# Patient Record
Sex: Female | Born: 1970
Health system: Southern US, Community
[De-identification: ages and names within clinical notes are randomized; demographics above are authoritative.]

## PROBLEM LIST (undated history)

## (undated) DIAGNOSIS — R112 Nausea with vomiting, unspecified: Secondary | ICD-10-CM

## (undated) DIAGNOSIS — R200 Anesthesia of skin: Secondary | ICD-10-CM

## (undated) DIAGNOSIS — Z9889 Other specified postprocedural states: Secondary | ICD-10-CM

## (undated) DIAGNOSIS — S82899A Other fracture of unspecified lower leg, initial encounter for closed fracture: Secondary | ICD-10-CM

## (undated) DIAGNOSIS — E669 Obesity, unspecified: Secondary | ICD-10-CM

## (undated) DIAGNOSIS — G4733 Obstructive sleep apnea (adult) (pediatric): Secondary | ICD-10-CM

## (undated) DIAGNOSIS — D219 Benign neoplasm of connective and other soft tissue, unspecified: Secondary | ICD-10-CM

## (undated) DIAGNOSIS — I10 Essential (primary) hypertension: Secondary | ICD-10-CM

## (undated) DIAGNOSIS — I2510 Atherosclerotic heart disease of native coronary artery without angina pectoris: Secondary | ICD-10-CM

## (undated) DIAGNOSIS — I219 Acute myocardial infarction, unspecified: Secondary | ICD-10-CM

## (undated) HISTORY — PX: SALPINGECTOMY: SHX328

## (undated) HISTORY — PX: APPENDECTOMY: SHX54

## (undated) HISTORY — DX: Obstructive sleep apnea (adult) (pediatric): G47.33

## (undated) HISTORY — DX: Essential (primary) hypertension: I10

## (undated) HISTORY — DX: Obesity, unspecified: E66.9

## (undated) HISTORY — PX: CHOLECYSTECTOMY: SHX55

## (undated) HISTORY — DX: Atherosclerotic heart disease of native coronary artery without angina pectoris: I25.10

## (undated) HISTORY — PX: REDUCTION MAMMAPLASTY: SUR839

---

## 2001-06-03 ENCOUNTER — Encounter: Payer: Self-pay | Admitting: Emergency Medicine

## 2001-06-03 ENCOUNTER — Emergency Department (HOSPITAL_COMMUNITY): Admission: EM | Admit: 2001-06-03 | Discharge: 2001-06-03 | Payer: Self-pay | Admitting: Emergency Medicine

## 2001-08-05 ENCOUNTER — Encounter: Payer: Self-pay | Admitting: Internal Medicine

## 2001-08-05 ENCOUNTER — Emergency Department (HOSPITAL_COMMUNITY): Admission: EM | Admit: 2001-08-05 | Discharge: 2001-08-05 | Payer: Self-pay | Admitting: Internal Medicine

## 2001-09-10 ENCOUNTER — Emergency Department (HOSPITAL_COMMUNITY): Admission: EM | Admit: 2001-09-10 | Discharge: 2001-09-10 | Payer: Self-pay | Admitting: *Deleted

## 2001-09-10 ENCOUNTER — Encounter: Payer: Self-pay | Admitting: *Deleted

## 2001-10-01 ENCOUNTER — Emergency Department (HOSPITAL_COMMUNITY): Admission: EM | Admit: 2001-10-01 | Discharge: 2001-10-01 | Payer: Self-pay | Admitting: Emergency Medicine

## 2001-10-13 HISTORY — PX: OTHER SURGICAL HISTORY: SHX169

## 2002-03-30 ENCOUNTER — Emergency Department (HOSPITAL_COMMUNITY): Admission: EM | Admit: 2002-03-30 | Discharge: 2002-03-30 | Payer: Self-pay | Admitting: Emergency Medicine

## 2002-03-30 ENCOUNTER — Encounter: Payer: Self-pay | Admitting: Emergency Medicine

## 2002-05-02 ENCOUNTER — Emergency Department (HOSPITAL_COMMUNITY): Admission: EM | Admit: 2002-05-02 | Discharge: 2002-05-03 | Payer: Self-pay | Admitting: *Deleted

## 2002-05-09 ENCOUNTER — Encounter: Payer: Self-pay | Admitting: Family Medicine

## 2002-05-09 ENCOUNTER — Inpatient Hospital Stay (HOSPITAL_COMMUNITY): Admission: AD | Admit: 2002-05-09 | Discharge: 2002-05-12 | Payer: Self-pay | Admitting: Family Medicine

## 2002-05-09 IMAGING — CT CT PELVIS W/ CM
1 of 2 series · 15 of 32 positions shown, 19 images · IV contrast (CONTRAST)
Comparison: none

FINDINGS
CLINICAL DATA: ABDOMINAL PAIN WITH VOMITING.  QUESTION APPENDICITIS.
CT ABDOMEN WITH CONTRAST MEDIA
CT SCAN OF THE ABDOMEN AND PELVIS WAS PERFORMED WITH INTRAVENOUS BUT WITHOUT ORAL CONTRAST (AS PER
REQUEST OF DR. MAHAKAL AFTER THE PATIENT WAS NOTED TO HAVE DIFFICULTY INGESTING CONTRAST).  150 CC
OMNIPAQUE 300 WAS UTILIZED.  NO COMPARISON EXAMS.
FATTY INFILTRATION OF THE LIVER.  NO EVIDENCE OF ABNORMAL FLUID COLLECTION OR OBVIOUS ABNORMAL
INFLAMMATORY PROCESS.  LACK OF ORAL CONTRAST LIMITS EVALUATION.  SPLEEN, PANCREAS, ADRENAL GLANDS
AND KIDNEYS UNREMARKABLE.
IMPRESSION
LIMITED BY LACK OF ORAL CONTRAST WITHOUT EVIDENCE OF OBVIOUS INFLAMMATORY PROCESS OR FLUID
COLLECTION.
FATTY INFILTRATION OF LIVER.
CT PELVIS WITH CONTRAST MEDIA
NO OBVIOUS INFLAMMATORY PROCESS ALONG THE COURSE OF THE APPENDIX.  LACK OF ORAL CONTRAST LIMITS
EVALUATION OF SUCH AS WELL AS EVALUATION OF SURROUNDING BOWEL.  PROMINENT APPEARANCE OF OVARIES.
SMALL AMOUNT OF FLUID IN THE RIGHT ASPECT OF THE CUL-DE-SAC CANNOT BE EXCLUDED (EVALUATION LIMITED
BY STREAK ARTIFACT).  PELVIC SONOGRAM CAN BE OBTAINED FOR FURTHER DELINEATION.  ADDITIONALLY, IF
THE PATIENT HAS PERSISTENT SYMPTOMS SUSPICIOUS FOR BOWEL ABNORMALITY, REPEAT EXAM AFTER PLACEMENT
OF NASOGASTRIC TUBE AND ADMINISTRATION OF ORAL CONTRAST OR INSTILLATION OF RECTAL CONTACT MAY BE
CONSIDERED.
LIMITED EXAM WITHOUT EVIDENCE OF APPENDICITIS AS NOTED ABOVE.
PROMINENT OVARIES.  ULTRASOUND MAY BE CONSIDERED FOR FURTHER DELINEATION.  THIS WOULD ALSO HELP
DETERMINE IF THERE IS A SMALL AMOUNT OF FLUID IN THE CUL-DE-SAC.

[Series 72: — · axial · 0.67mm/px · z∈[+972,+1392]mm · 15 of 92 slices shown, 19 images]
[im 4/92  soft-tissue]
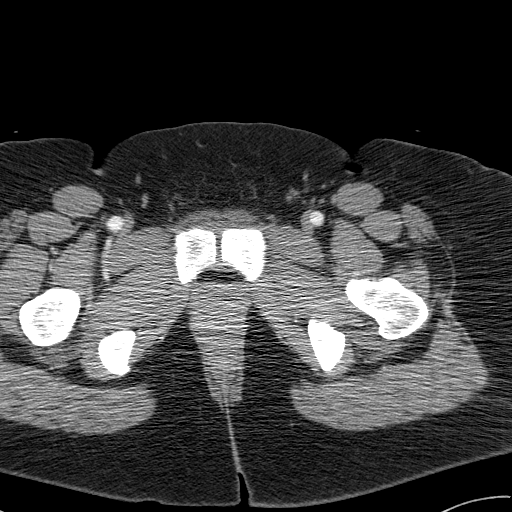
[im 4/92  bone]
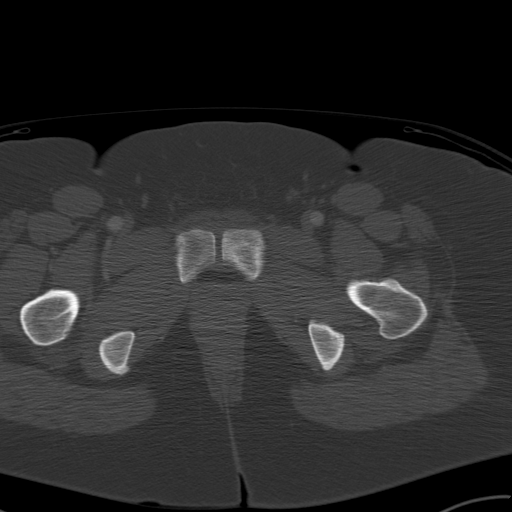
[im 12/92  soft-tissue]
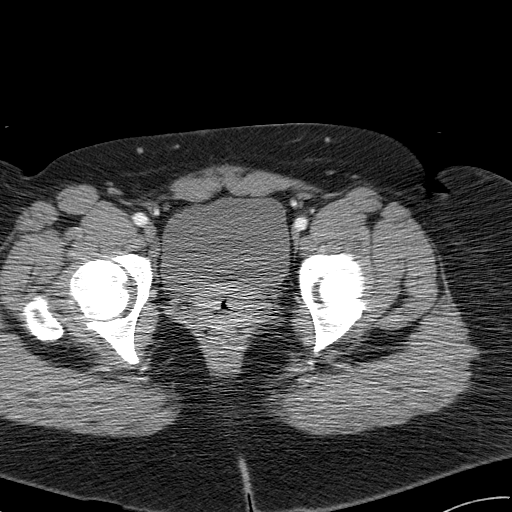
[im 20/92  soft-tissue]
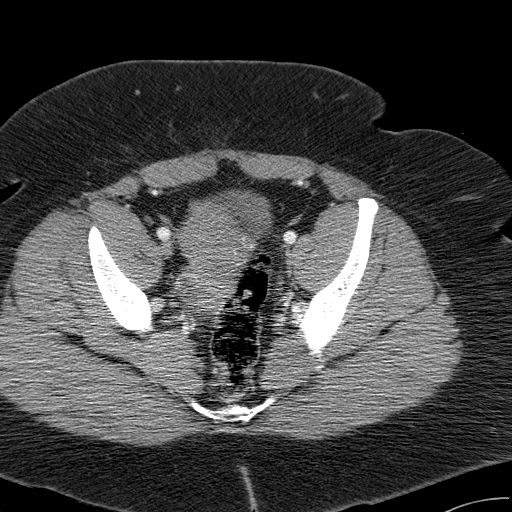
[im 24/92  soft-tissue]
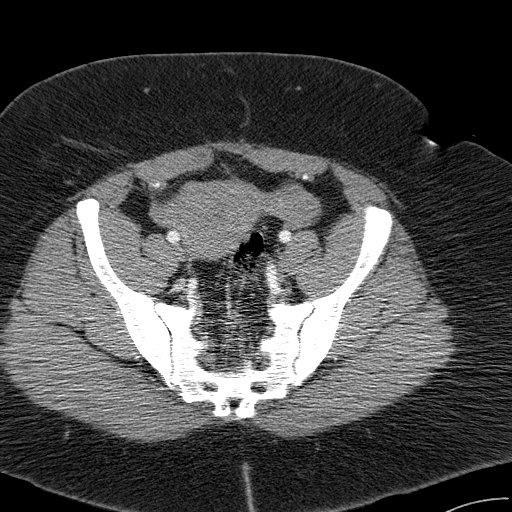
[im 32/92  soft-tissue]
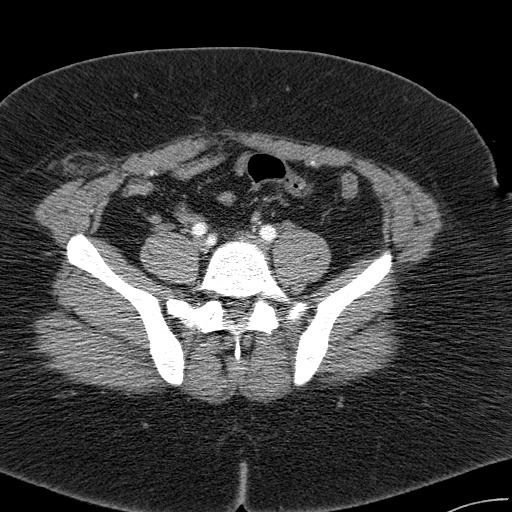
[im 40/92  soft-tissue]
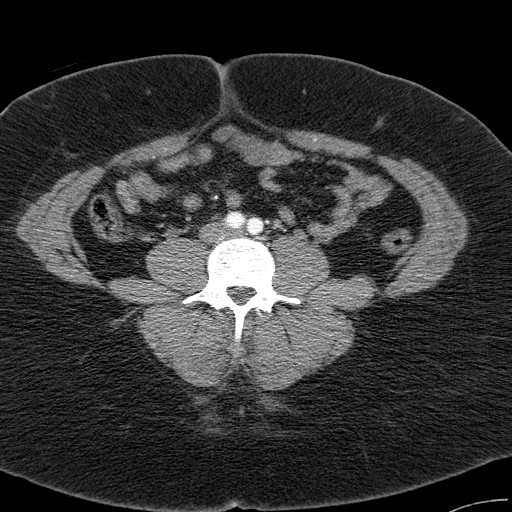
[im 48/92  soft-tissue]
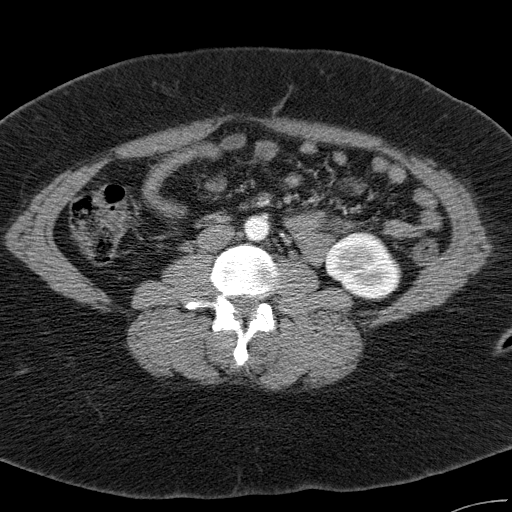
[im 52/92  soft-tissue]
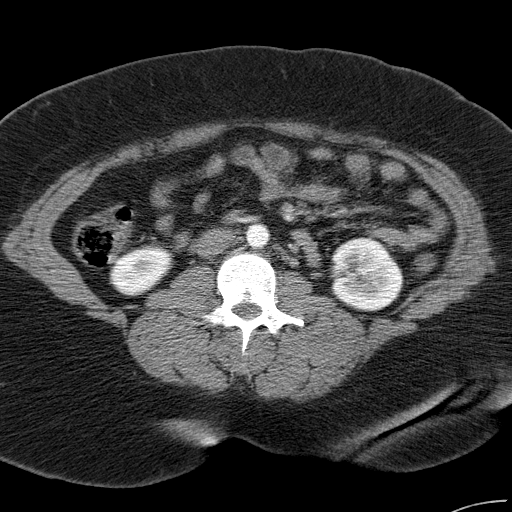
[im 60/92  soft-tissue]
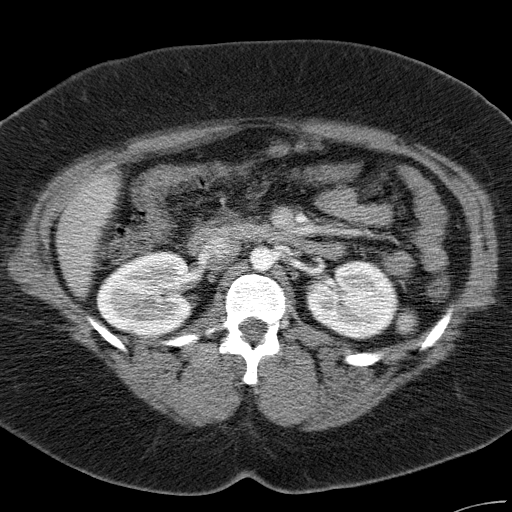
[im 60/92  bone]
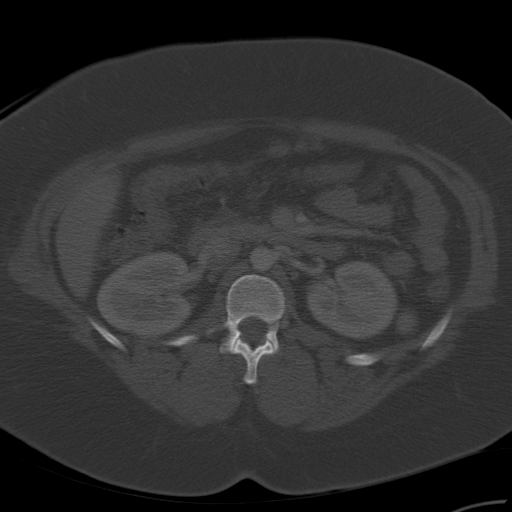
[im 68/92  soft-tissue]
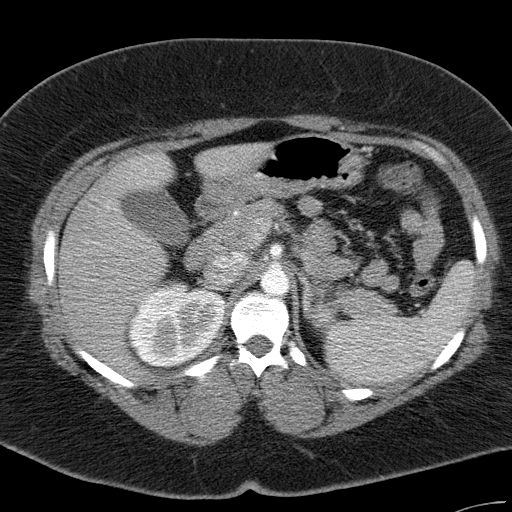
[im 72/92  soft-tissue]
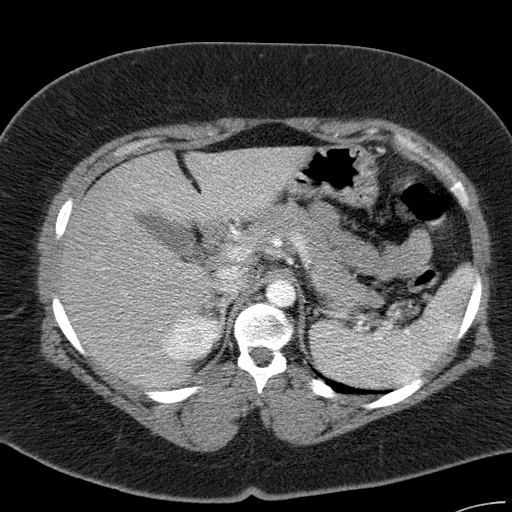
[im 76/92  lung]
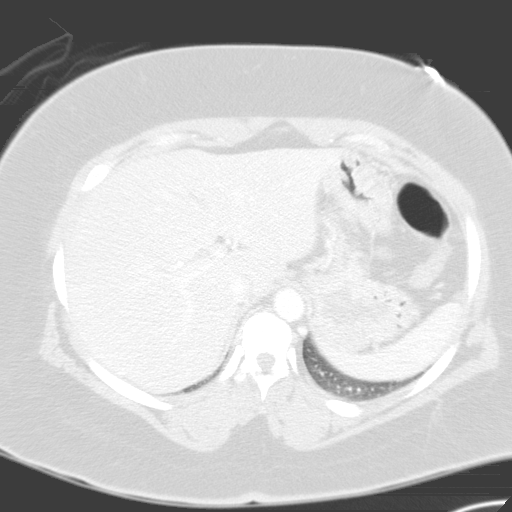
[im 80/92  soft-tissue]
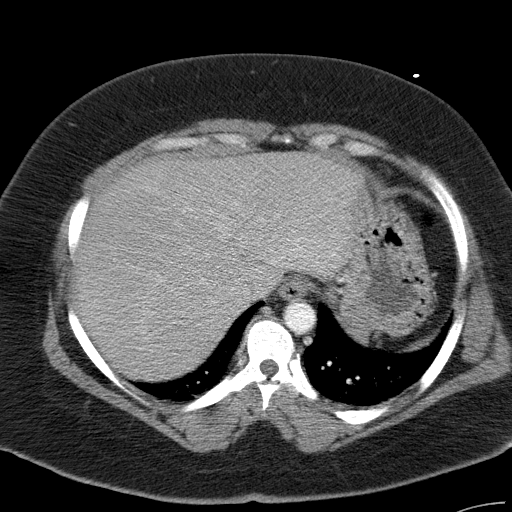
[im 80/92  lung]
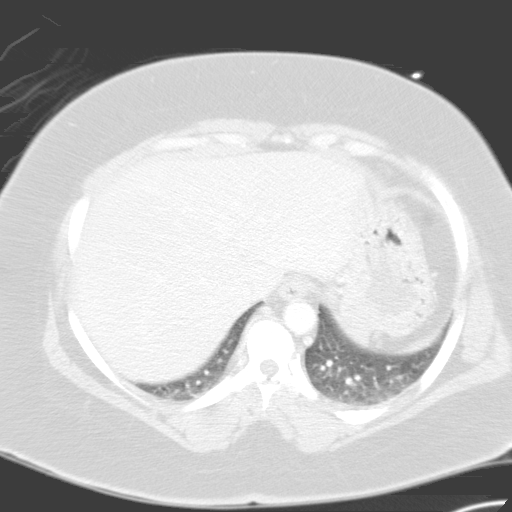
[im 84/92  lung]
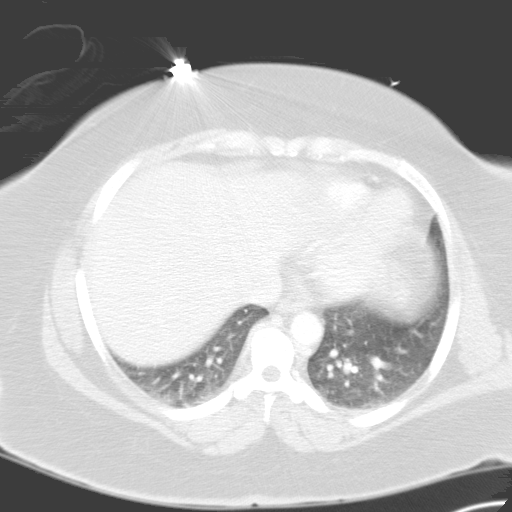
[im 88/92  soft-tissue]
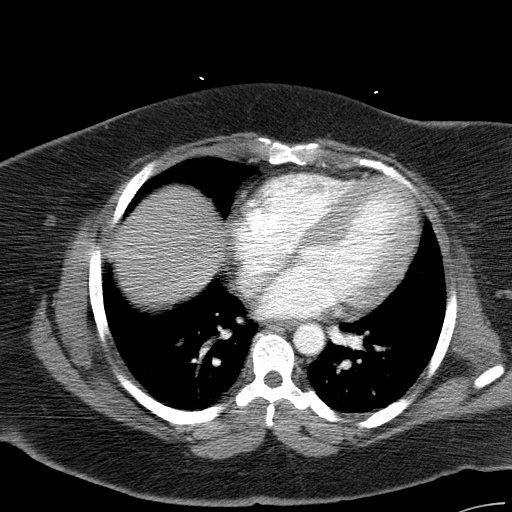
[im 88/92  lung]
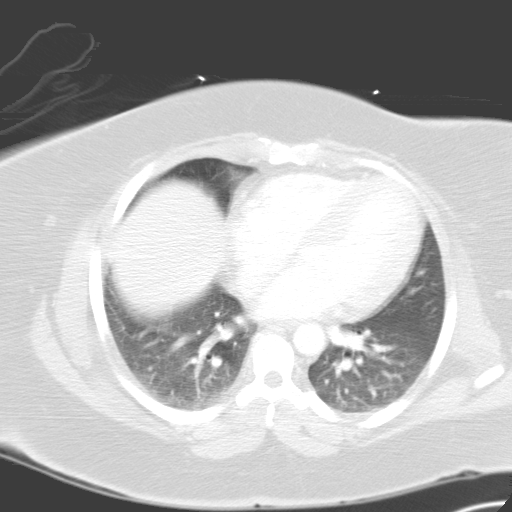

[15 of 32 positions shown; findings below may reference images not displayed]

## 2002-05-10 ENCOUNTER — Encounter: Payer: Self-pay | Admitting: Family Medicine

## 2002-05-11 ENCOUNTER — Encounter: Payer: Self-pay | Admitting: Family Medicine
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
## 2002-05-12 ENCOUNTER — Encounter: Payer: Self-pay | Admitting: Family Medicine

## 2002-05-17 ENCOUNTER — Inpatient Hospital Stay (HOSPITAL_COMMUNITY): Admission: RE | Admit: 2002-05-17 | Discharge: 2002-05-19 | Payer: Self-pay | Admitting: General Surgery

## 2002-08-21 ENCOUNTER — Emergency Department (HOSPITAL_COMMUNITY): Admission: EM | Admit: 2002-08-21 | Discharge: 2002-08-21 | Payer: Self-pay | Admitting: Emergency Medicine

## 2002-08-21 ENCOUNTER — Encounter: Payer: Self-pay | Admitting: Emergency Medicine

## 2002-10-13 HISTORY — PX: OTHER SURGICAL HISTORY: SHX169

## 2003-12-09 ENCOUNTER — Emergency Department (HOSPITAL_COMMUNITY): Admission: EM | Admit: 2003-12-09 | Discharge: 2003-12-09 | Payer: Self-pay | Admitting: Emergency Medicine
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
## 2004-07-19 ENCOUNTER — Emergency Department (HOSPITAL_COMMUNITY): Admission: EM | Admit: 2004-07-19 | Discharge: 2004-07-19 | Payer: Self-pay | Admitting: Emergency Medicine

## 2004-07-19 IMAGING — CT CT PELVIS W/ CM
1 of 3 series · 14 of 32 positions shown, 19 images · IV contrast (CONTRAST)
Comparison: Report dated [DATE].

ABDOMEN CT WITH CONTRAST

<!--  IDXRADR:ADDEND:BEGIN -->Addendum Begins
<!--  IDXRADR:ADDEND:INNER_BEGIN -->Additional clinical information indicates that the patient has had no bacteria or white cells in
her urine and no other findings to indicate a right kidney infection other than right abdominal
pain. On review of the images, the previously suspected 2.9 x 2.2 cm area of lobar nephronia in the
right kidney may represent imaging of a portion of incompletely opacified right renal medulla due
to relatively early phase imaging on the delayed images through the kidneys. The medulla elsewhere
in both kidneys has a similar appearance. 

<!--  IDXRADR:ADDEND:INNER_END -->Addendum Ends
<!--  IDXRADR:ADDEND:END -->Clinical Data: Right abdominal pain, nausea, vomiting and constipation. Previous cholecystectomy
and appendectomy.
TECHNIQUE: Multidetector CT imaging of the abdomen and pelvis was performed following the standard
protocol during bolus administration of intravenous contrast.
Contrast:  150 cc Omnipaque 300

[Series 7793: — · axial · 0.71mm/px · z∈[+1398,+1818]mm · 14 of 96 slices shown, 19 images]
[im 6/96  soft-tissue]
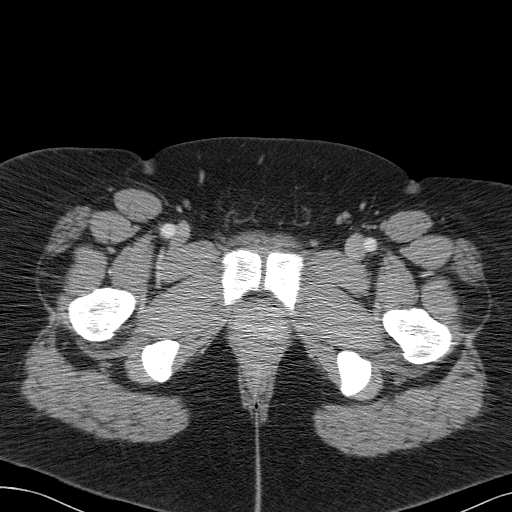
[im 6/96  bone]
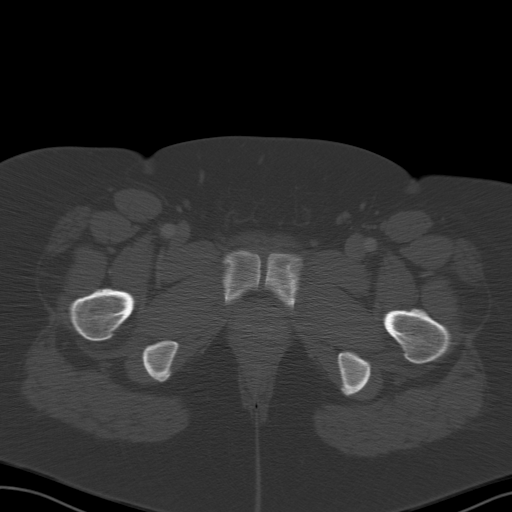
[im 12/96  soft-tissue]
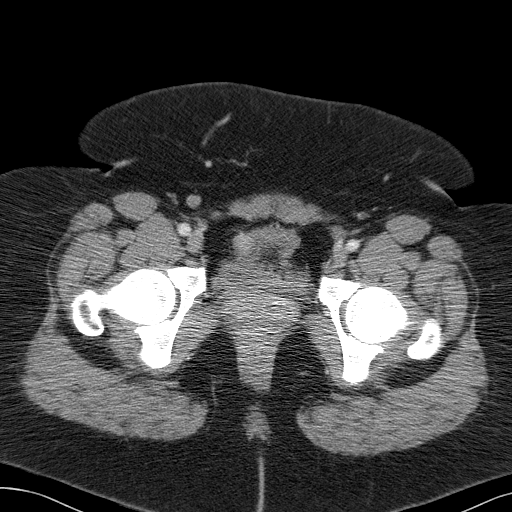
[im 23/96  soft-tissue]
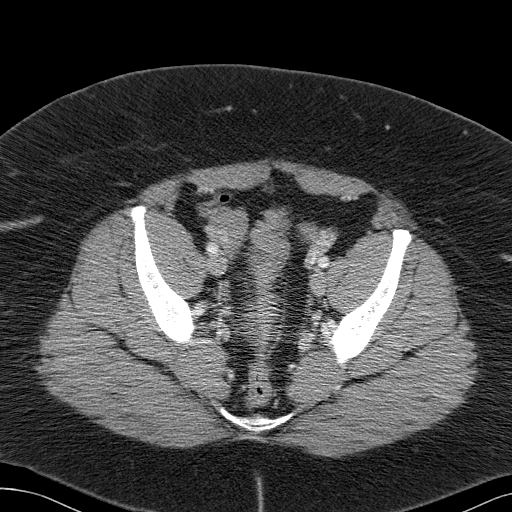
[im 28/96  soft-tissue]
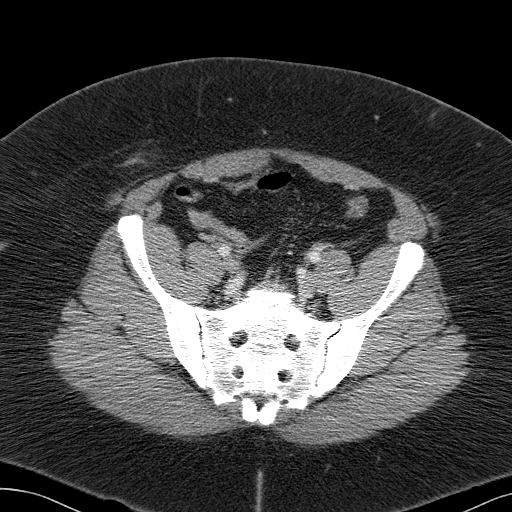
[im 34/96  soft-tissue]
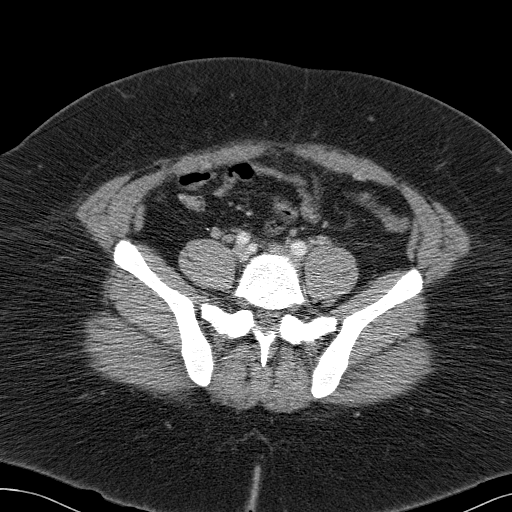
[im 40/96  soft-tissue]
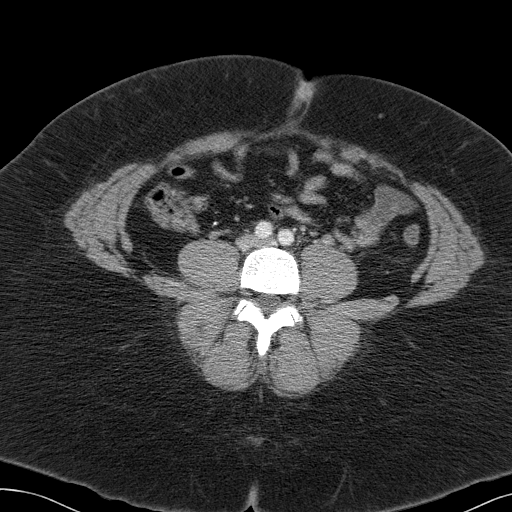
[im 51/96  soft-tissue]
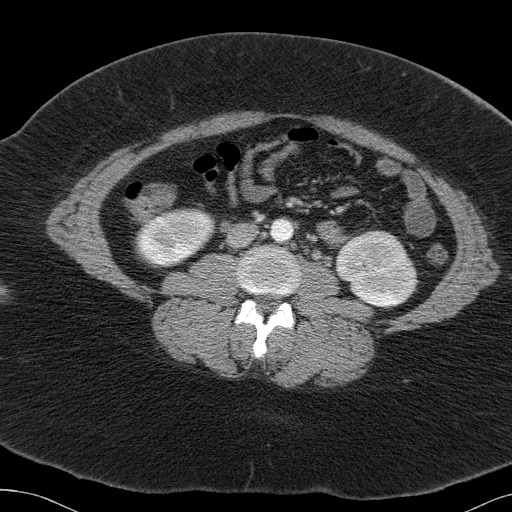
[im 56/96  soft-tissue]
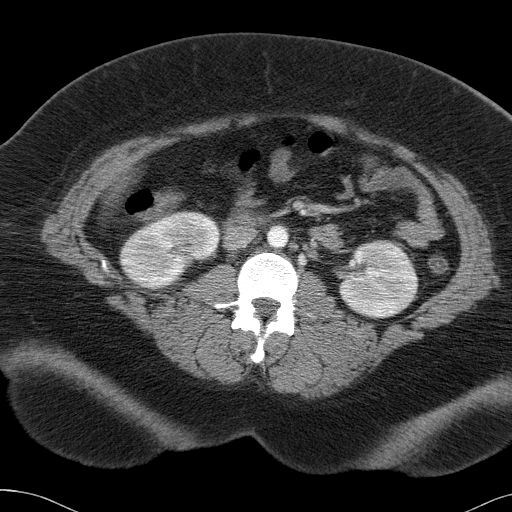
[im 62/96  soft-tissue]
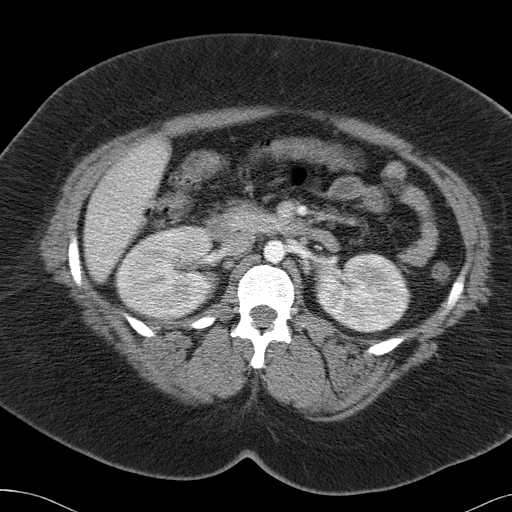
[im 62/96  bone]
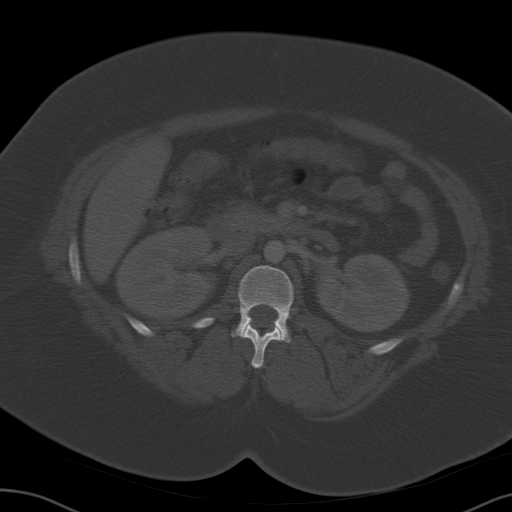
[im 68/96  soft-tissue]
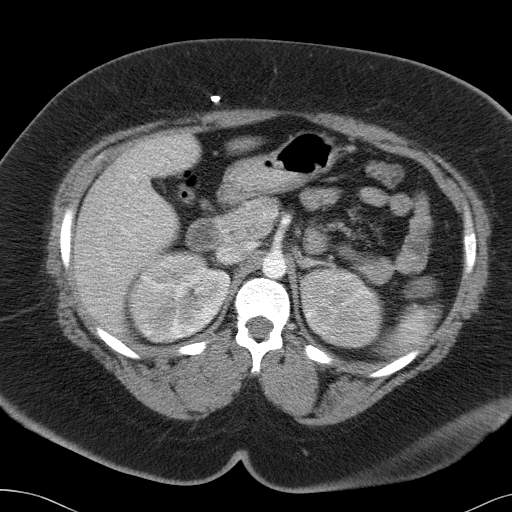
[im 73/96  soft-tissue]
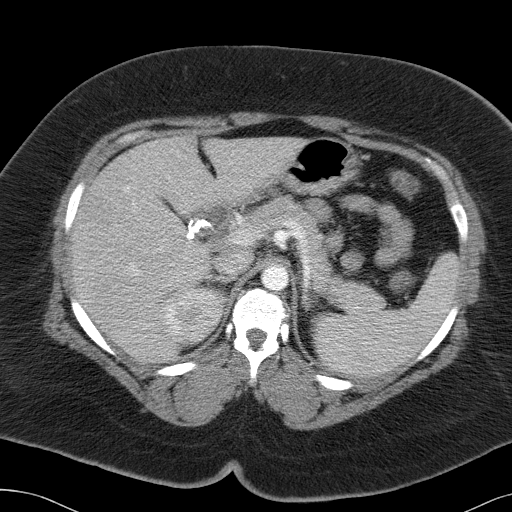
[im 73/96  lung]
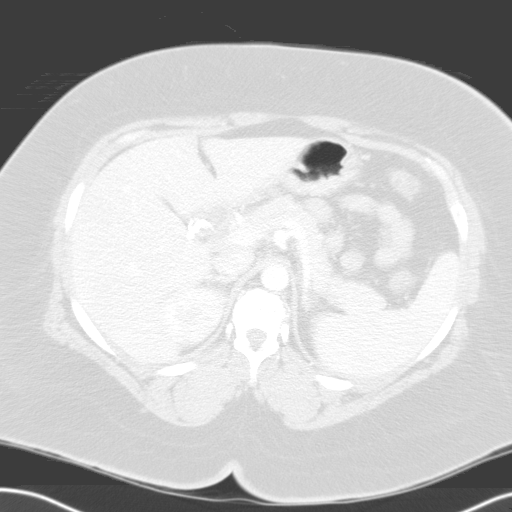
[im 79/96  lung]
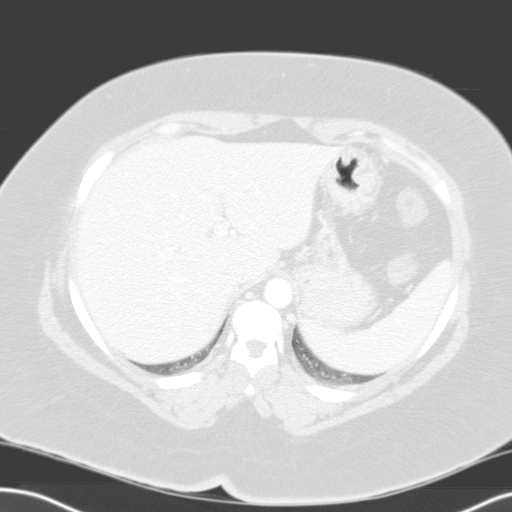
[im 84/96  soft-tissue]
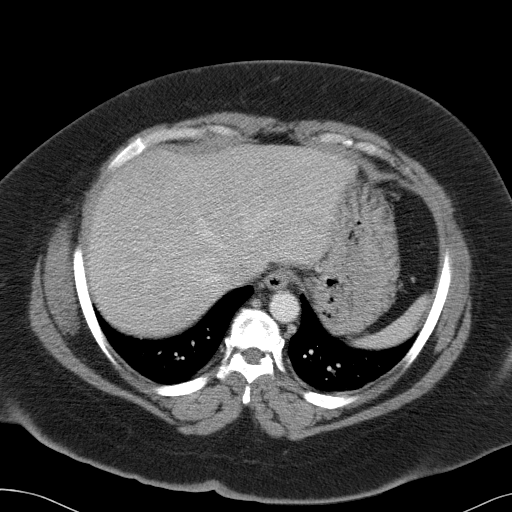
[im 84/96  lung]
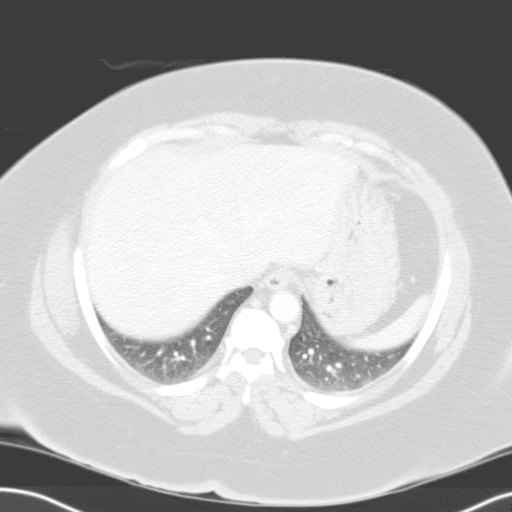
[im 90/96  soft-tissue]
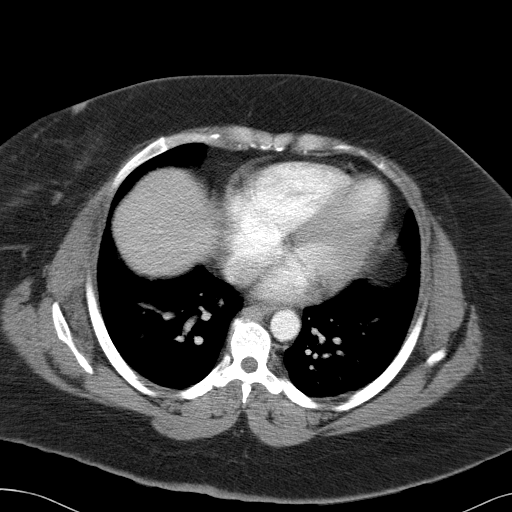
[im 90/96  lung]
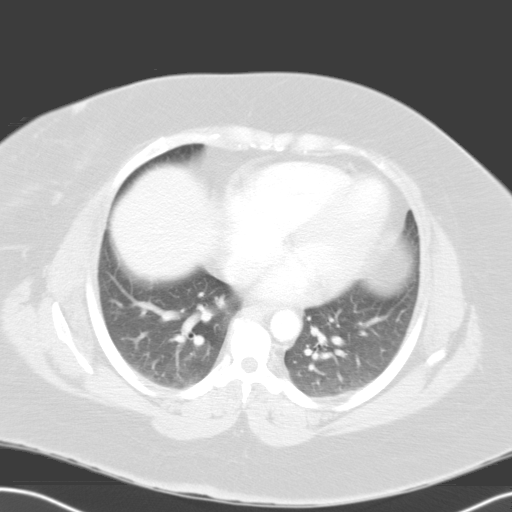

[14 of 32 positions shown; findings below may reference images not displayed]

FINDINGS: 2.9 x 2.2 cm ill-defined oval area of low density in the lateral aspect of the
midportion of the right kidney.  clips. Otherwise, unremarkable examination.

IMPRESSION

Right renal lobar nephronia.

PELVIS CT WITH CONTRAST
FINDINGS: Dense calcification or suture material in the expected position of the appendix. 1.7 cm
simple appearing left ovarian cyst. No free peritoneal fluid or air.

IMPRESSION

1.7 cm left ovarian cyst.

## 2004-07-19 IMAGING — CR DG ABDOMEN ACUTE W/ 1V CHEST
4 series · 4 of 4 positions shown · non-contrast
Comparison: none

CLINICAL DATA: Abdominal pain right side. Vomiting and diarrhea for 3 days.

[view not recorded (1 of 4)]
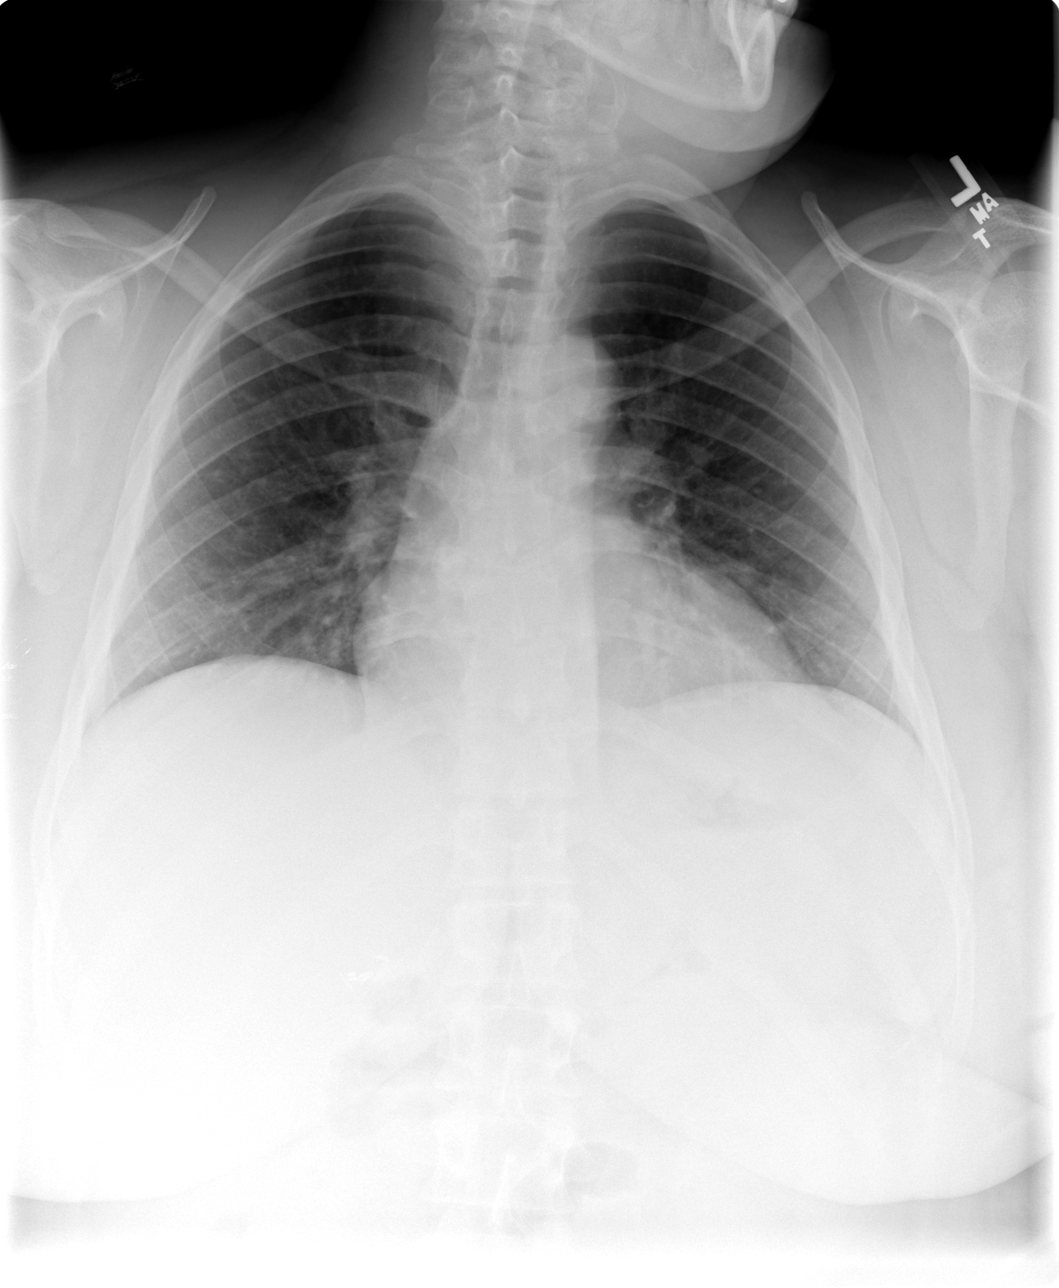

[view not recorded (2 of 4)]
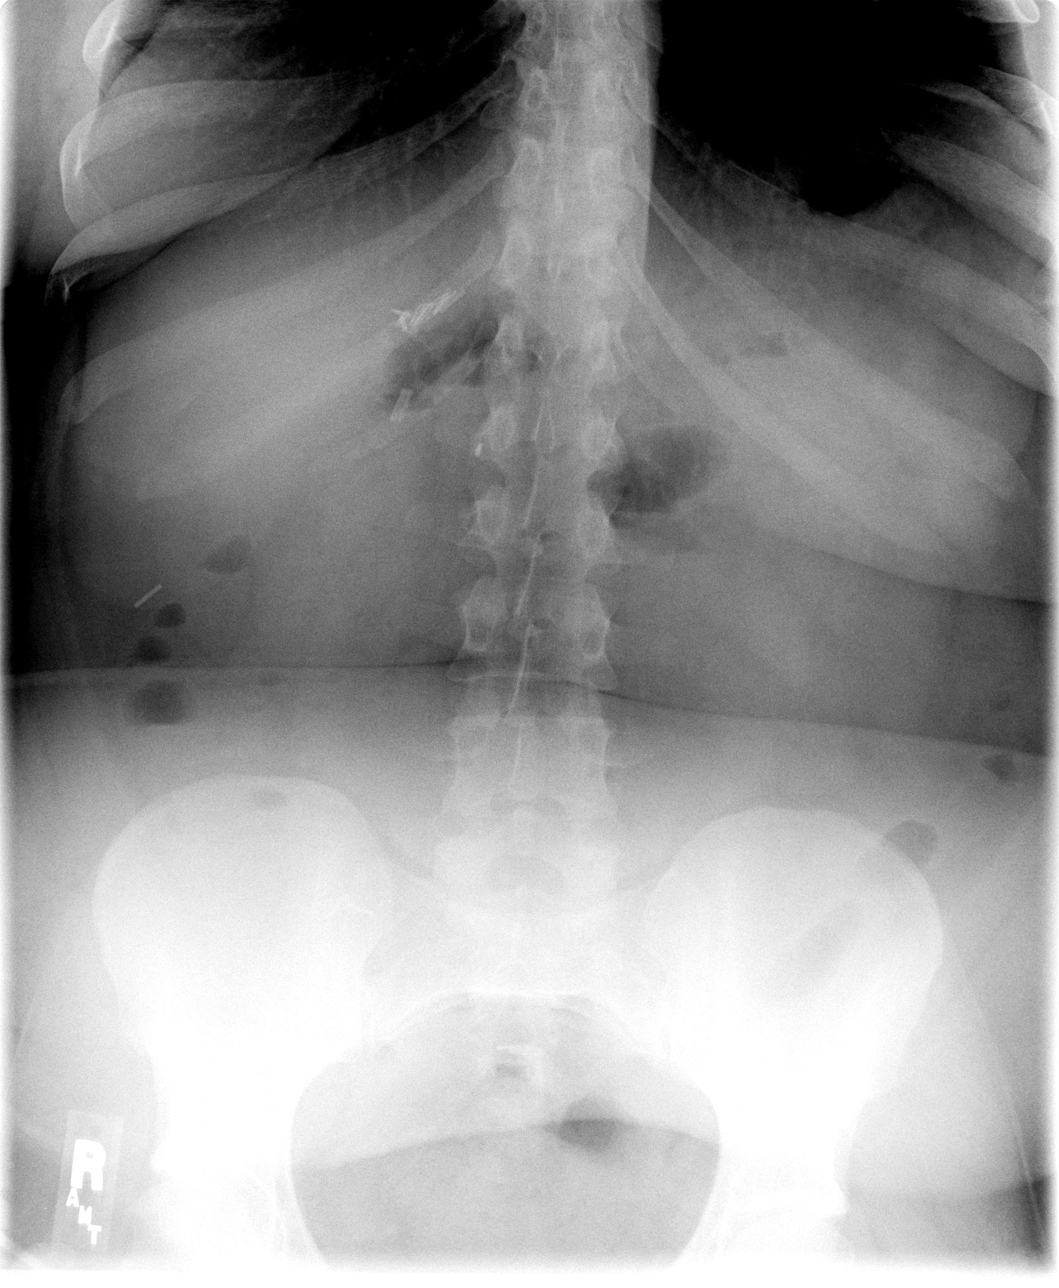

[view not recorded (3 of 4)]
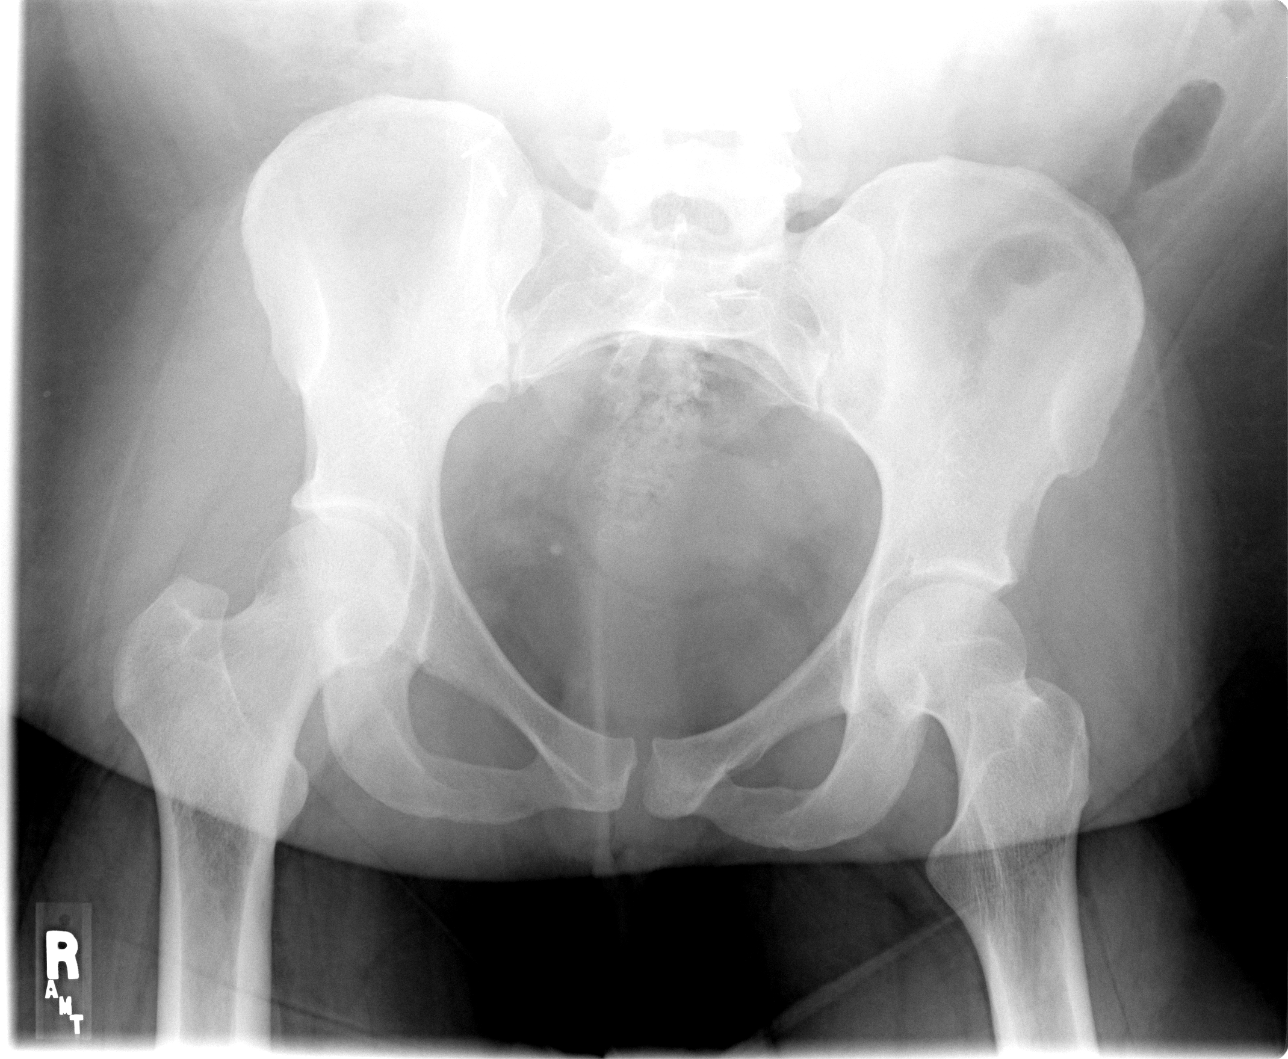

[view not recorded (4 of 4)]
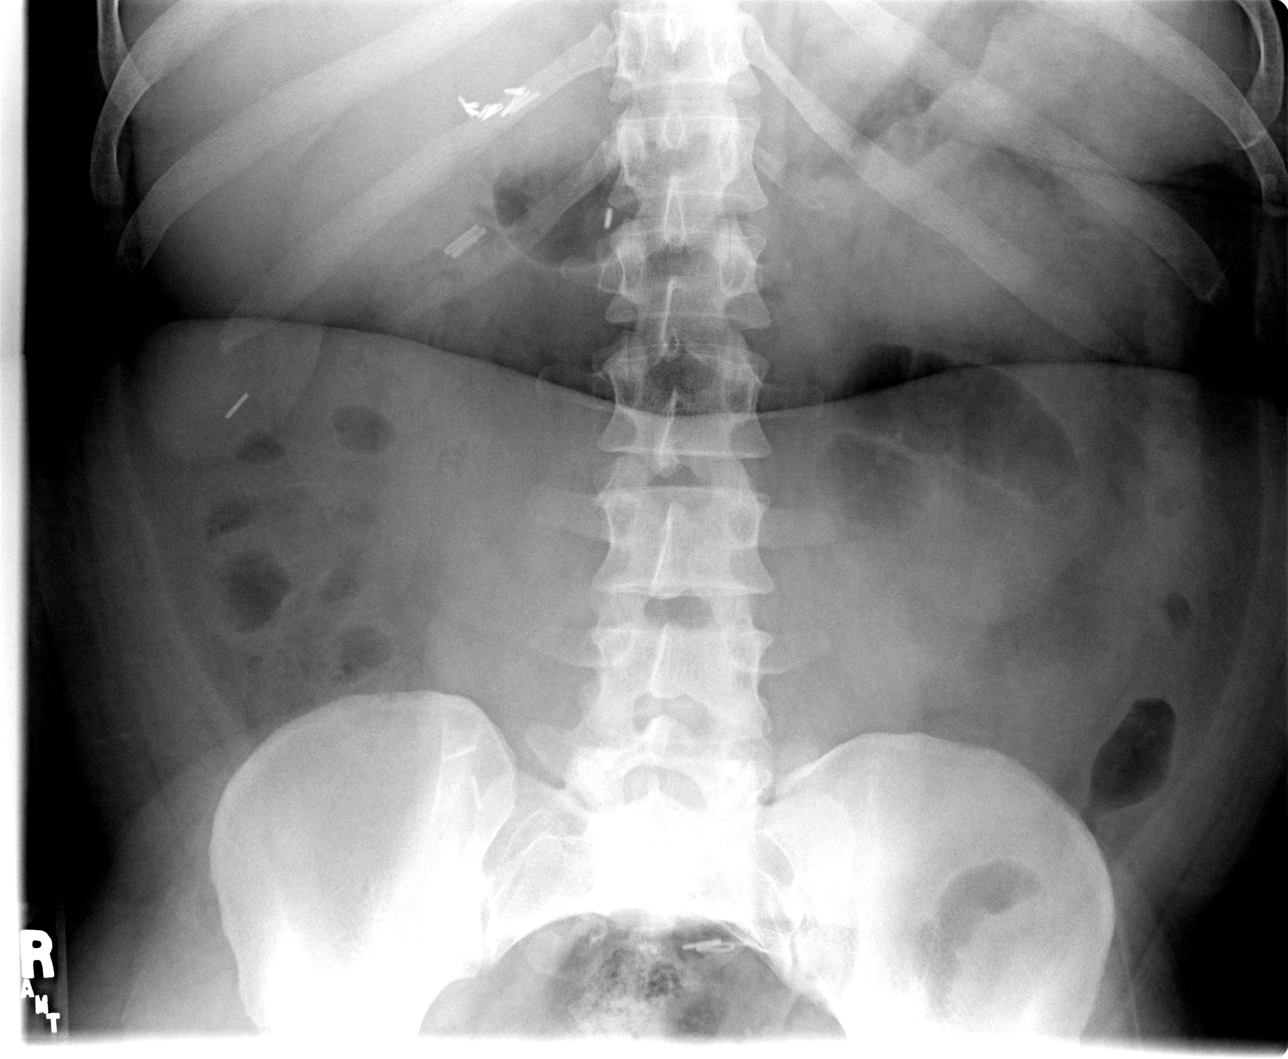

[4 of 4 positions shown; findings below may reference images not displayed]

ABDOMEN ACUTE WITH CHEST

 Flat and erect films of  the abdomen are made and show no evidence of obstruction. No free air is
seen beneath the diaphragm. There are metallic clips in right upper quadrant of the abdomen, right
lower quadrant of the abdomen and in the mid pelvis. No abnormal calcifications or masses are
identified. 

PA erect film of the chest shows no evidence of active disease.

IMPRESSIONS
No evidence of obstruction of the bowel and  no active disease in the chest.

## 2004-07-26 ENCOUNTER — Ambulatory Visit (HOSPITAL_COMMUNITY): Admission: RE | Admit: 2004-07-26 | Discharge: 2004-07-26 | Payer: Self-pay | Admitting: Internal Medicine

## 2005-01-13 ENCOUNTER — Ambulatory Visit: Payer: Self-pay | Admitting: Family Medicine

## 2005-02-10 ENCOUNTER — Ambulatory Visit: Payer: Self-pay | Admitting: Family Medicine

## 2005-02-11 ENCOUNTER — Ambulatory Visit: Payer: Self-pay | Admitting: Family Medicine

## 2005-04-10 ENCOUNTER — Ambulatory Visit: Payer: Self-pay | Admitting: Family Medicine

## 2005-04-10 ENCOUNTER — Ambulatory Visit (HOSPITAL_COMMUNITY): Admission: RE | Admit: 2005-04-10 | Discharge: 2005-04-10 | Payer: Self-pay | Admitting: Family Medicine

## 2005-04-10 IMAGING — CT CT ABDOMEN W/ CM
1 of 3 series · 14 of 32 positions shown, 20 images · IV contrast (CONTRAST)
Comparison: [DATE]

ABDOMEN CT WITH CONTRAST

CLINICAL DATA: Lower abdominal pain
TECHNIQUE: Multidetector CT imaging of the abdomen and pelvis was performed
following the standard protocol during bolus administration of intravenous
contrast.

Contrast:  150 cc Omnipaque 300

[Series 3269: — · axial · 0.97mm/px · z∈[+1528,+1938]mm · 14 of 94 slices shown, 20 images]
[im 6/94  soft-tissue]
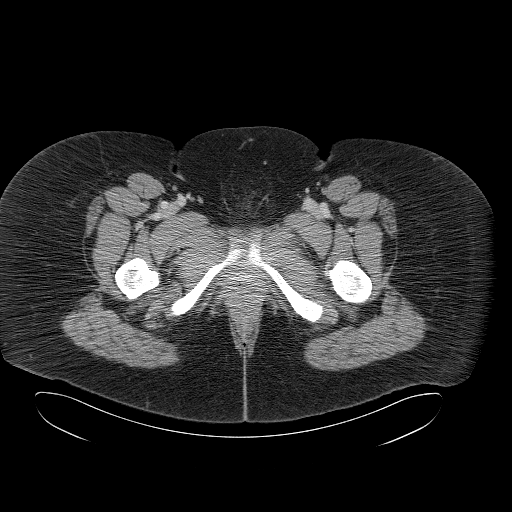
[im 6/94  bone]
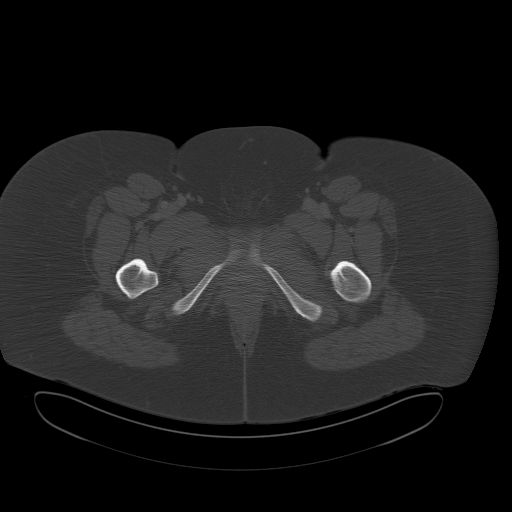
[im 11/94  soft-tissue]
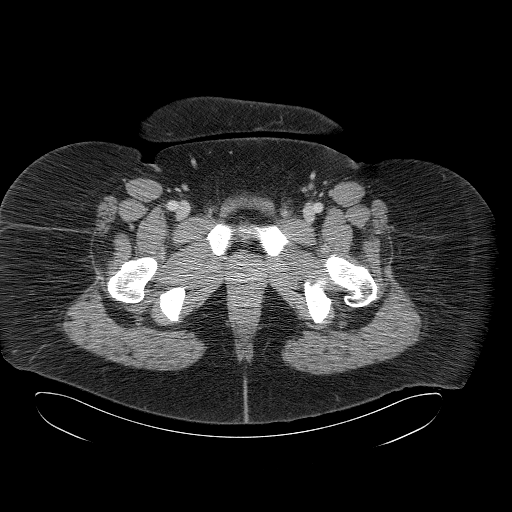
[im 17/94  soft-tissue]
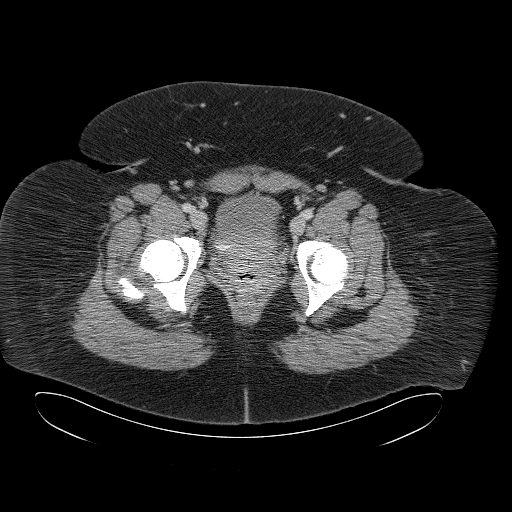
[im 28/94  soft-tissue]
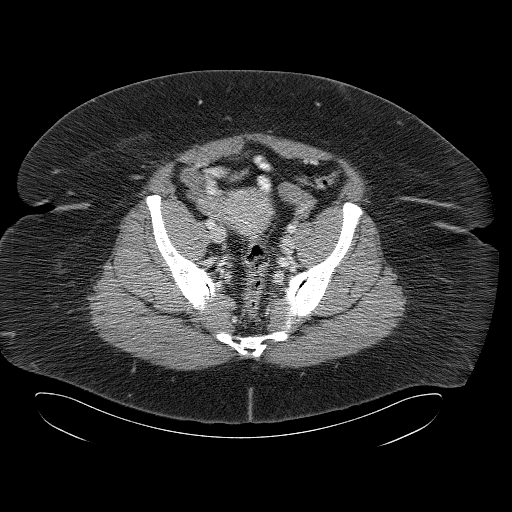
[im 33/94  soft-tissue]
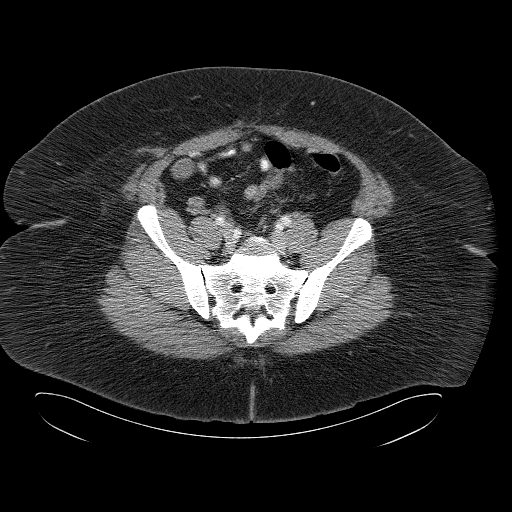
[im 39/94  soft-tissue]
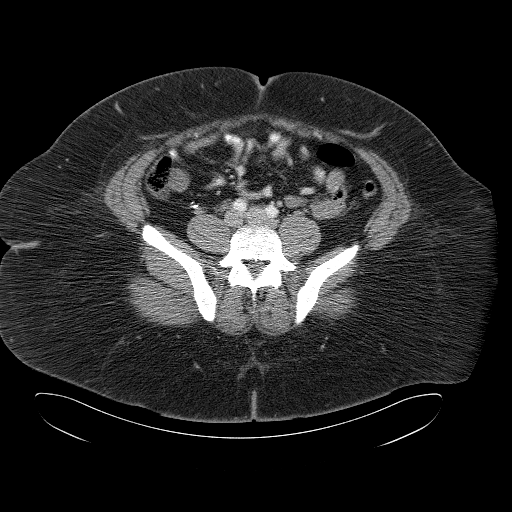
[im 44/94  soft-tissue]
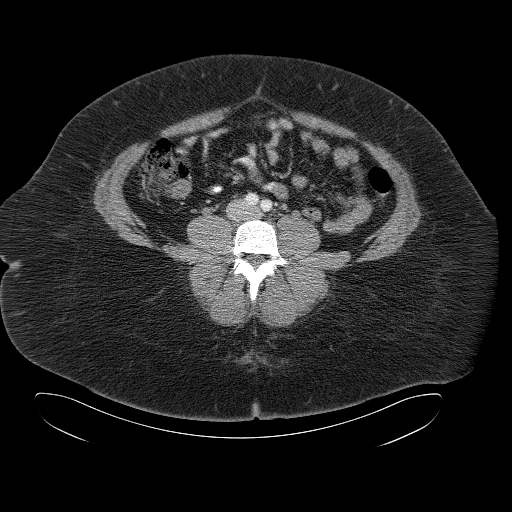
[im 50/94  soft-tissue]
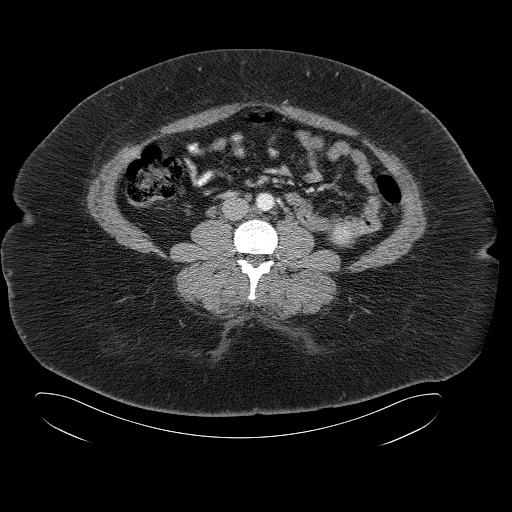
[im 55/94  soft-tissue]
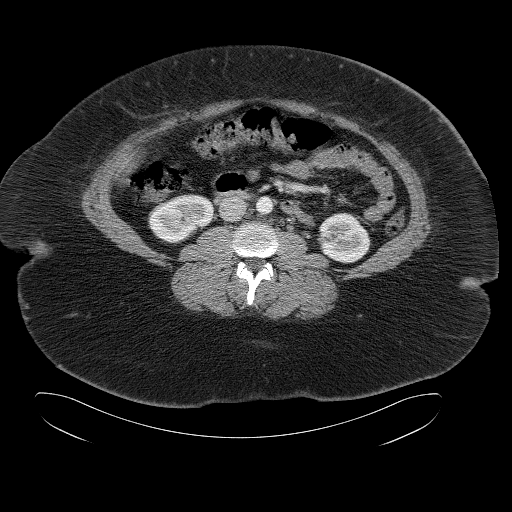
[im 55/94  bone]
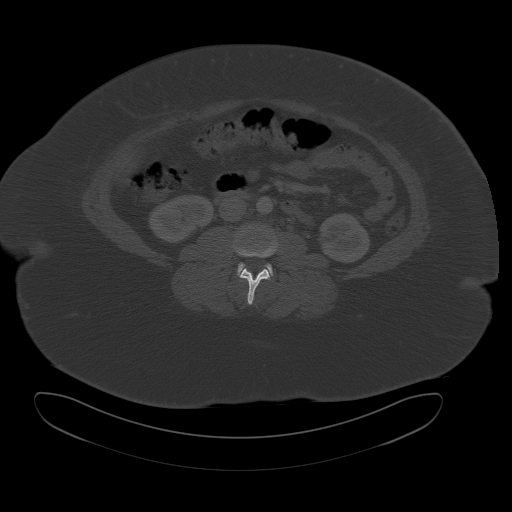
[im 61/94  soft-tissue]
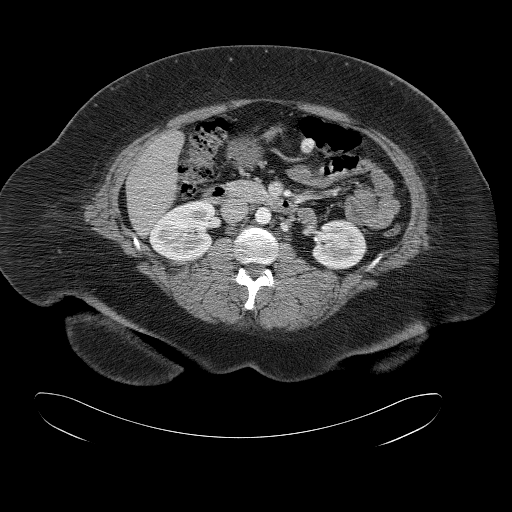
[im 72/94  soft-tissue]
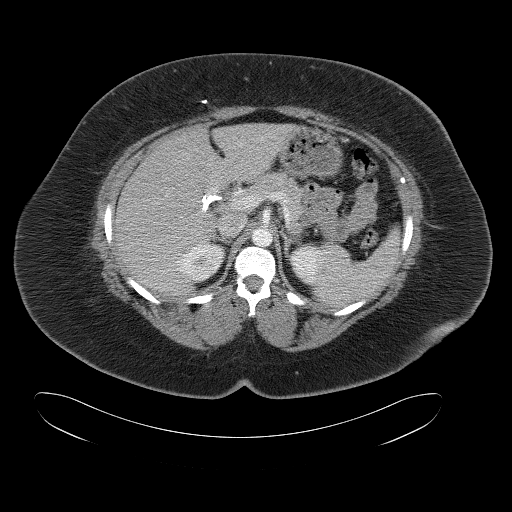
[im 72/94  lung]
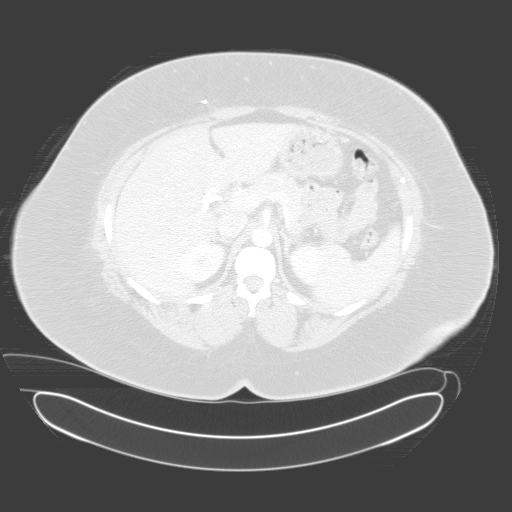
[im 77/94  soft-tissue]
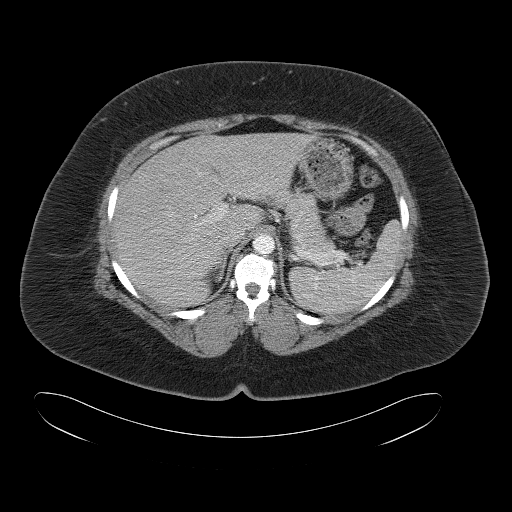
[im 77/94  lung]
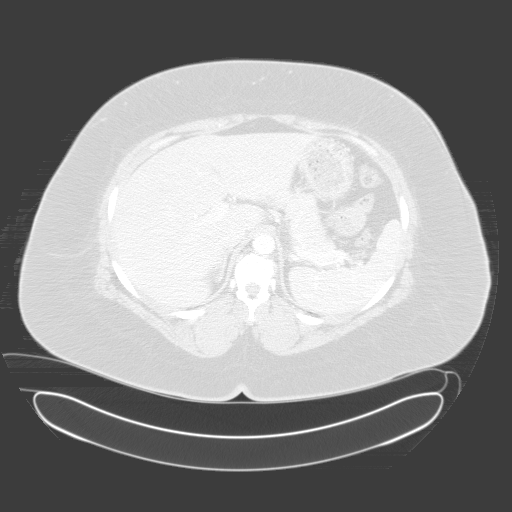
[im 83/94  soft-tissue]
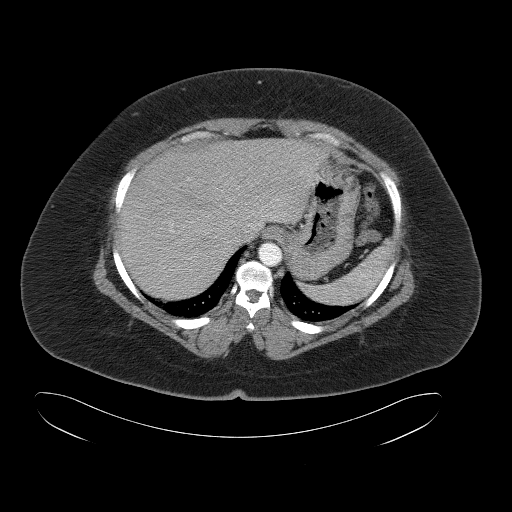
[im 83/94  lung]
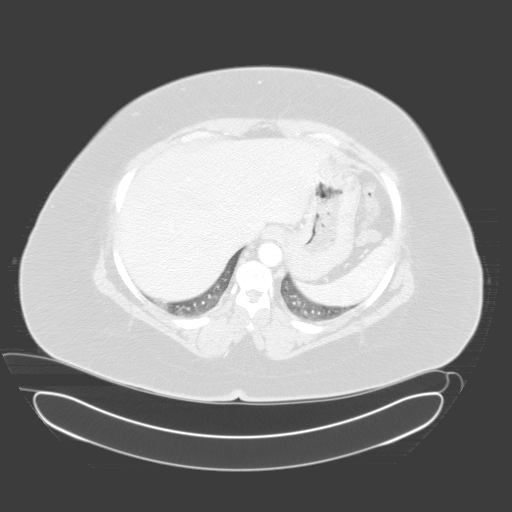
[im 88/94  soft-tissue]
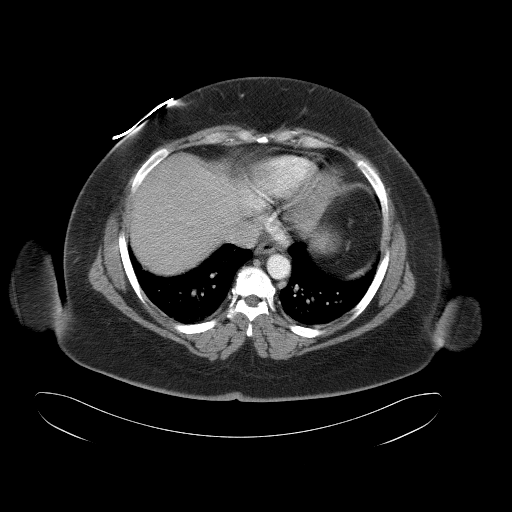
[im 88/94  lung]
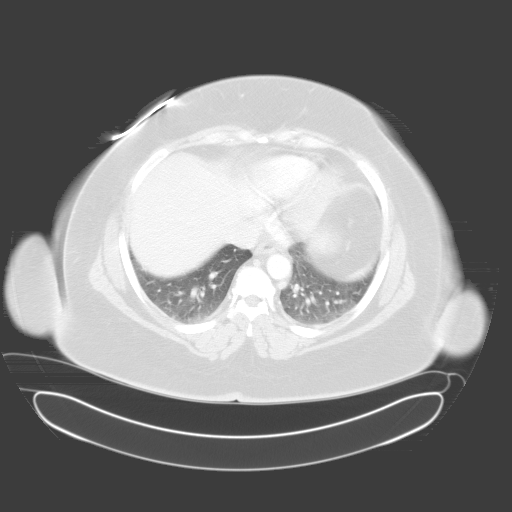

[14 of 32 positions shown; findings below may reference images not displayed]

FINDINGS: Patient status post cholecystectomy. Liver, spleen, pancreas,
adrenals, kidneys unremarkable. Scattered colonic diverticula. No free fluid,
free air, or adenopathy.

IMPRESSION

Scattered colonic diverticula. Otherwise unremarkable.

PELVIS CT WITH CONTRAST
FINDINGS: Adnexa unremarkable. Probable small fungal fibroid. Otherwise uterus
unremarkable. Patient is status post appendectomy. No free fluid, free air, or
adenopathy.

IMPRESSION

Probable uterine fibroid. Status post appendectomy. No acute findings.

## 2005-11-25 ENCOUNTER — Ambulatory Visit: Payer: Self-pay | Admitting: Family Medicine

## 2006-01-01 ENCOUNTER — Ambulatory Visit: Payer: Self-pay | Admitting: Family Medicine

## 2006-07-15 ENCOUNTER — Ambulatory Visit: Payer: Self-pay | Admitting: Family Medicine

## 2006-08-03 ENCOUNTER — Other Ambulatory Visit: Admission: RE | Admit: 2006-08-03 | Discharge: 2006-08-03 | Payer: Self-pay | Admitting: Family Medicine

## 2006-08-03 ENCOUNTER — Ambulatory Visit: Payer: Self-pay | Admitting: Family Medicine

## 2006-12-03 ENCOUNTER — Ambulatory Visit: Payer: Self-pay | Admitting: Family Medicine

## 2007-02-25 ENCOUNTER — Ambulatory Visit: Payer: Self-pay | Admitting: Family Medicine

## 2007-04-20 ENCOUNTER — Ambulatory Visit: Payer: Self-pay | Admitting: Family Medicine

## 2007-07-12 ENCOUNTER — Encounter: Payer: Self-pay | Admitting: Family Medicine

## 2007-07-12 LAB — CONVERTED CEMR LAB
ALT: 36 units/L — ABNORMAL HIGH (ref 0–35)
AST: 20 units/L (ref 0–37)
Albumin: 3.6 g/dL (ref 3.5–5.2)
Alkaline Phosphatase: 57 units/L (ref 39–117)
BUN: 12 mg/dL (ref 6–23)
Bilirubin, Direct: 0.2 mg/dL (ref 0.0–0.3)
CO2: 23 meq/L (ref 19–32)
Calcium: 8.7 mg/dL (ref 8.4–10.5)
Chloride: 102 meq/L (ref 96–112)
Cholesterol: 186 mg/dL (ref 0–200)
Creatinine, Ser: 0.88 mg/dL (ref 0.40–1.20)
Glucose, Bld: 83 mg/dL (ref 70–99)
HDL: 68 mg/dL (ref >39)
Indirect Bilirubin: 0.9 mg/dL (ref 0.0–0.9)
LDL Cholesterol: 94 mg/dL (ref 0–99)
Potassium: 4 meq/L (ref 3.5–5.3)
Sodium: 136 meq/L (ref 135–145)
TSH: 1.148 microintl units/mL (ref 0.350–5.50)
Total Bilirubin: 1.1 mg/dL (ref 0.3–1.2)
Total CHOL/HDL Ratio: 2.7
Total Protein: 6.6 g/dL (ref 6.0–8.3)
Triglycerides: 118 mg/dL (ref <150)
VLDL: 24 mg/dL (ref 0–40)

## 2007-09-16 ENCOUNTER — Encounter: Payer: Self-pay | Admitting: Family Medicine

## 2007-09-16 ENCOUNTER — Other Ambulatory Visit: Admission: RE | Admit: 2007-09-16 | Discharge: 2007-09-16 | Payer: Self-pay | Admitting: Family Medicine

## 2007-09-16 ENCOUNTER — Encounter (INDEPENDENT_AMBULATORY_CARE_PROVIDER_SITE_OTHER): Payer: Self-pay | Admitting: *Deleted

## 2007-09-16 ENCOUNTER — Ambulatory Visit: Payer: Self-pay | Admitting: Family Medicine

## 2007-09-16 LAB — CONVERTED CEMR LAB
Chlamydia, DNA Probe: NEGATIVE
GC Probe Amp, Genital: NEGATIVE
Pap Smear: NORMAL

## 2007-09-17 ENCOUNTER — Encounter: Payer: Self-pay | Admitting: Family Medicine

## 2007-09-17 LAB — CONVERTED CEMR LAB
Candida species: NEGATIVE
Gardnerella vaginalis: NEGATIVE
Trichomonal Vaginitis: NEGATIVE

## 2007-09-20 ENCOUNTER — Ambulatory Visit (HOSPITAL_COMMUNITY): Admission: RE | Admit: 2007-09-20 | Discharge: 2007-09-20 | Payer: Self-pay | Admitting: Family Medicine

## 2007-09-20 IMAGING — CR DG CHEST 2V
2 series · 2 of 2 positions shown · non-contrast
Comparison: none

HISTORY: Goiter, hypertension

CHEST 2 VIEWS:
No prior exam for comparison.
Normal heart size, mediastinal contours, and pulmonary vascularity.
Minimal patient rotation to right.
Bronchitic changes without infiltrate or effusion.
Mild spur formation thoracic spine.
Surgical clips right upper quadrant question cholecystectomy.

[view not recorded (1 of 2)]
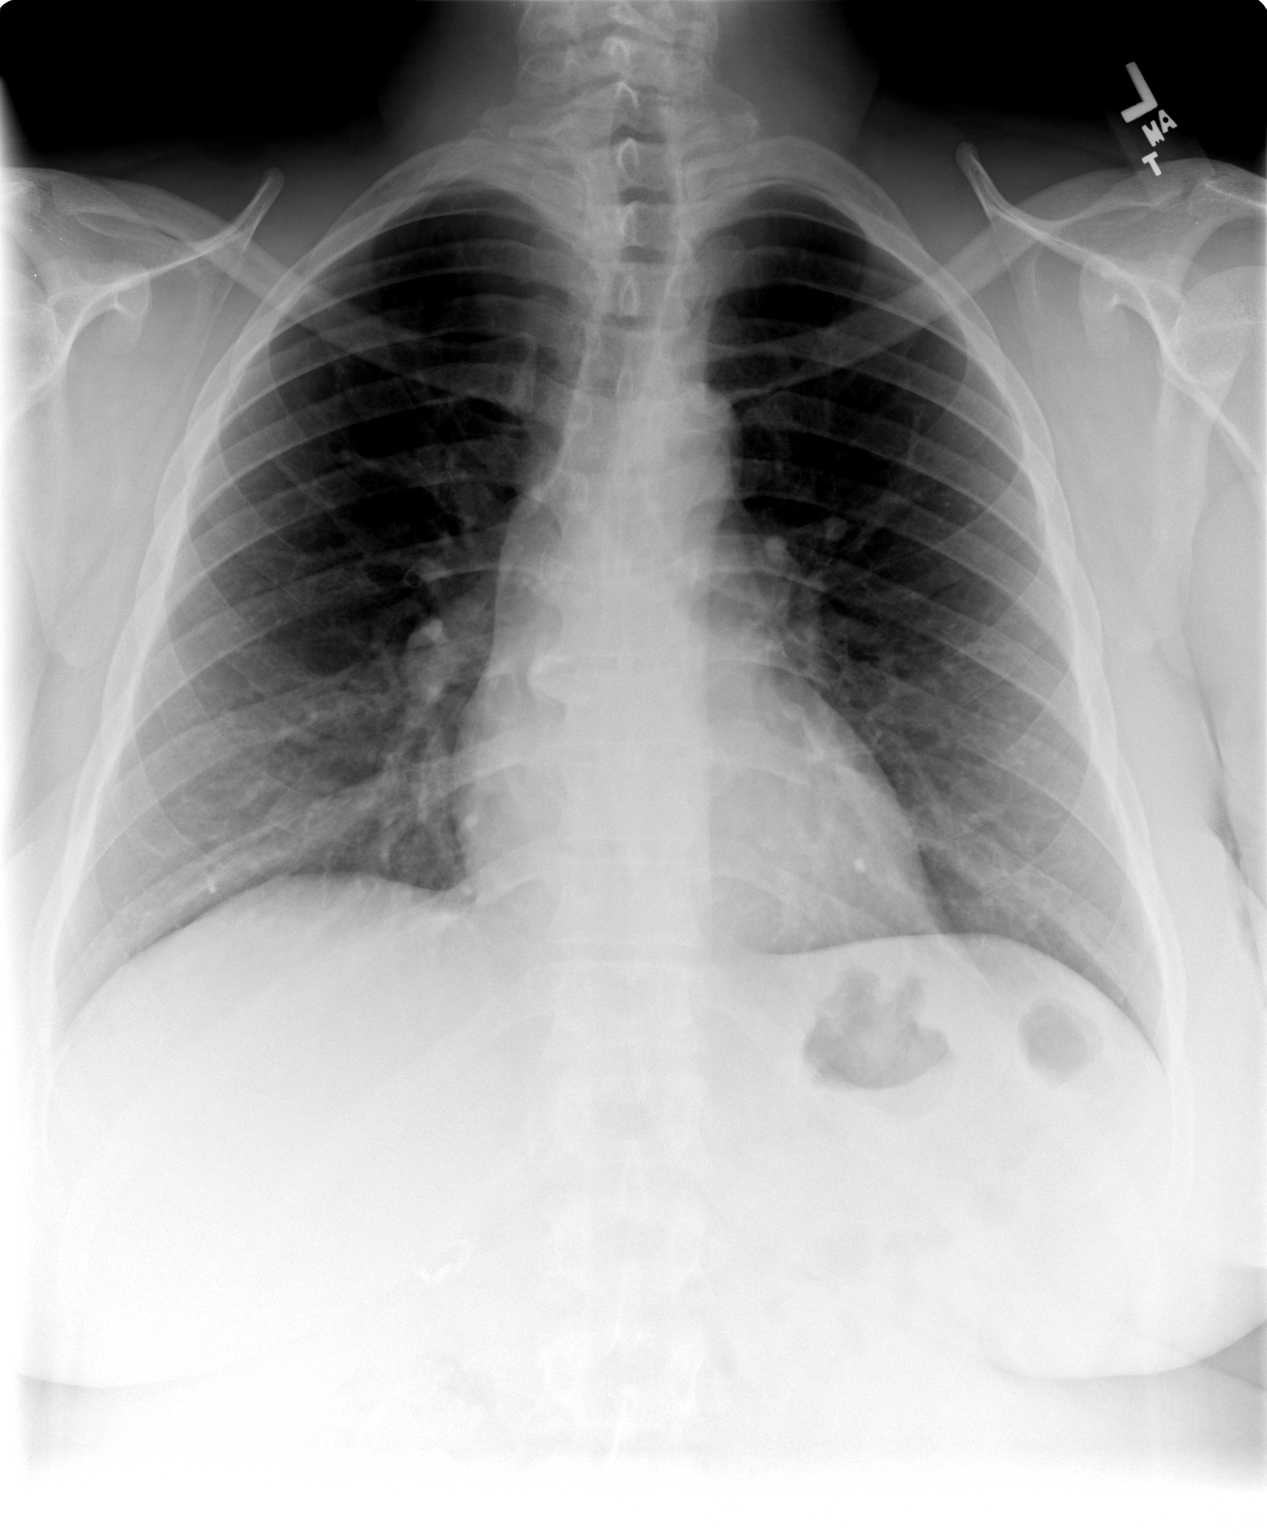

[view not recorded (2 of 2)]
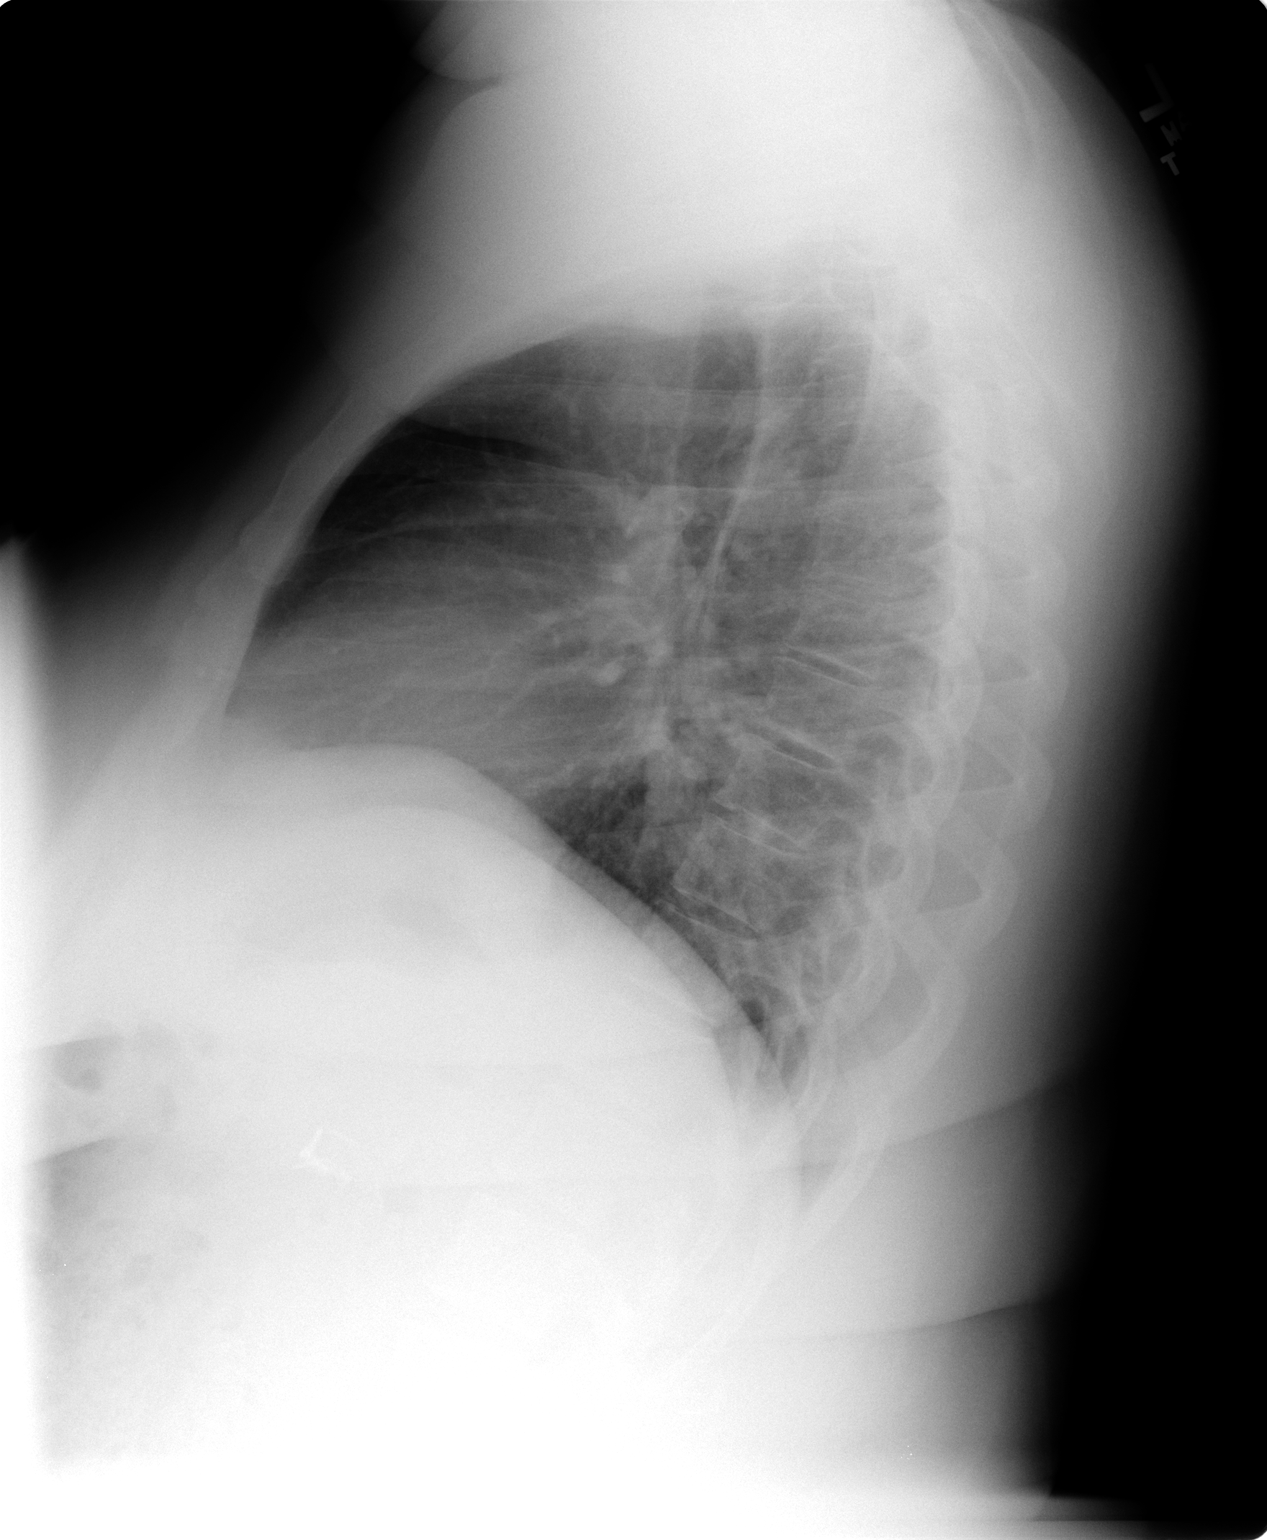

[2 of 2 positions shown; findings below may reference images not displayed]

IMPRESSION: No acute abnormalities.

## 2007-09-20 IMAGING — US US SOFT TISSUE HEAD/NECK
1 series · 14 of 25 positions shown · non-contrast
Comparison: none

HISTORY: Goiter

[Series 1: unknown · 0.11mm/px · 14 of 30 slices shown]
[im 1/30]
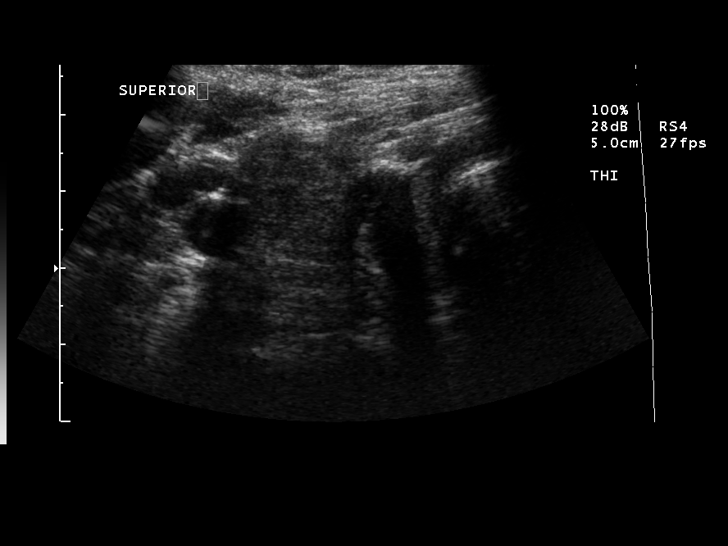
[im 3/30]
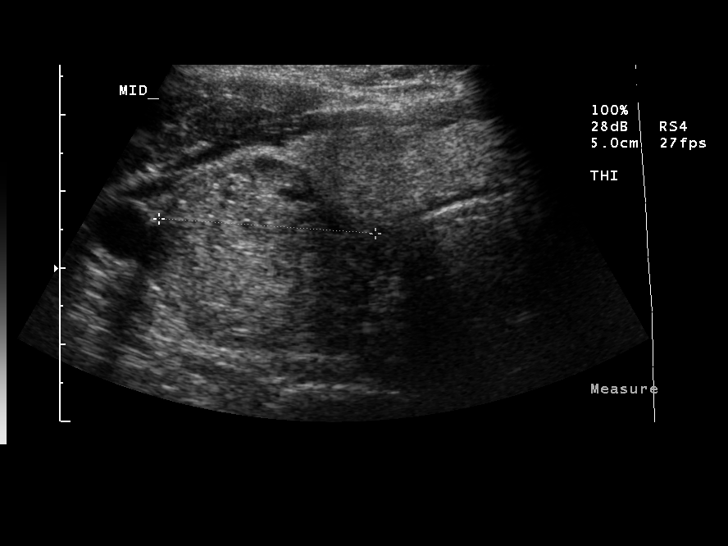
[im 5/30]
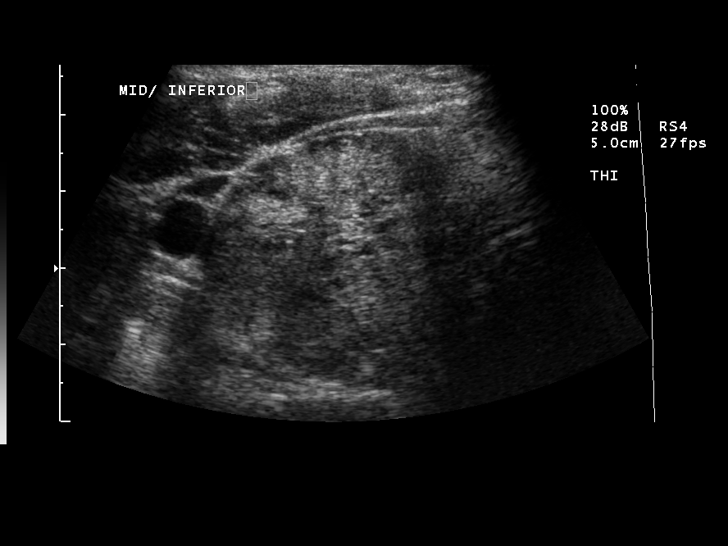
[im 8/30]
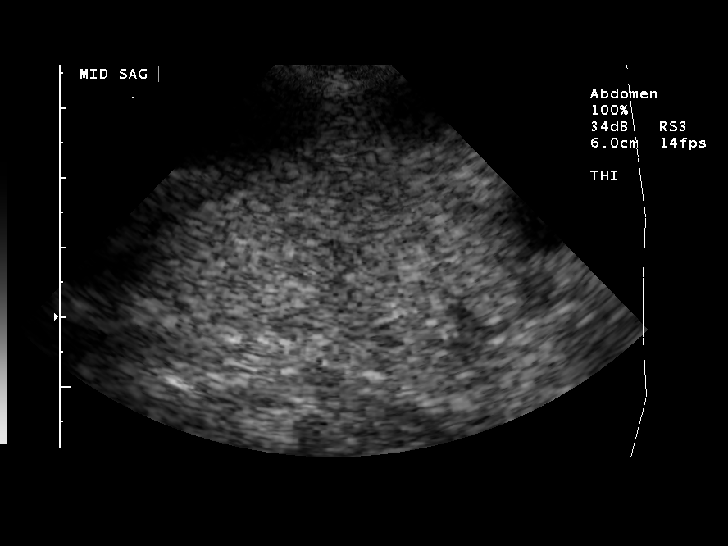
[im 10/30]
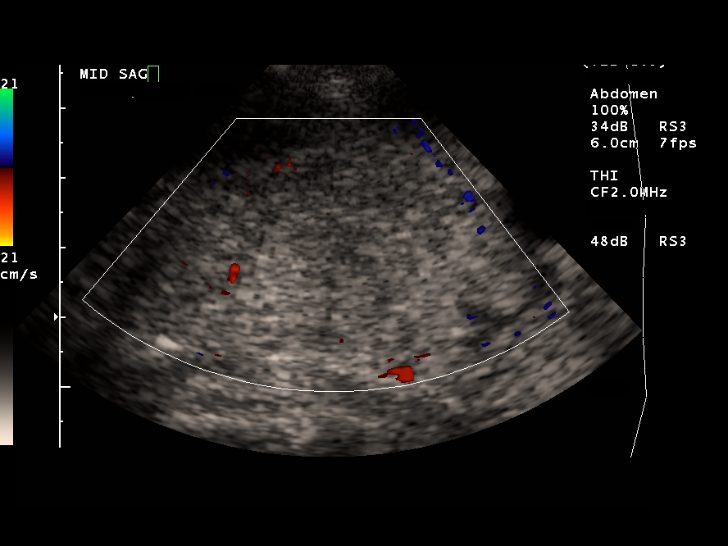
[im 11/30]
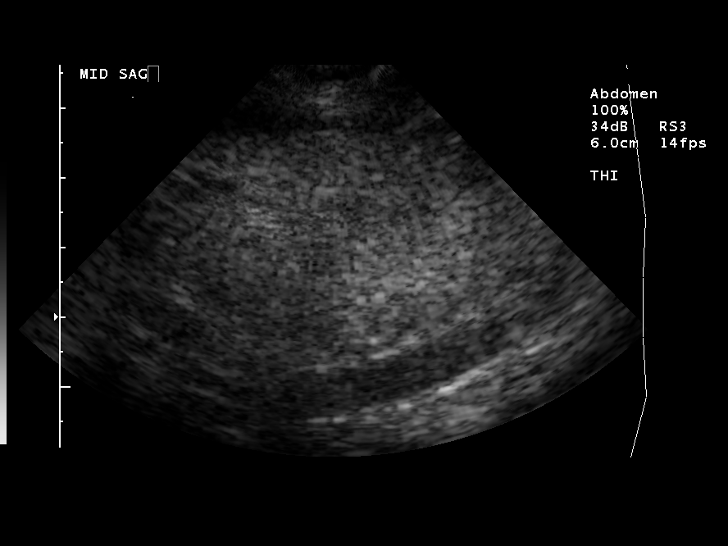
[im 14/30]
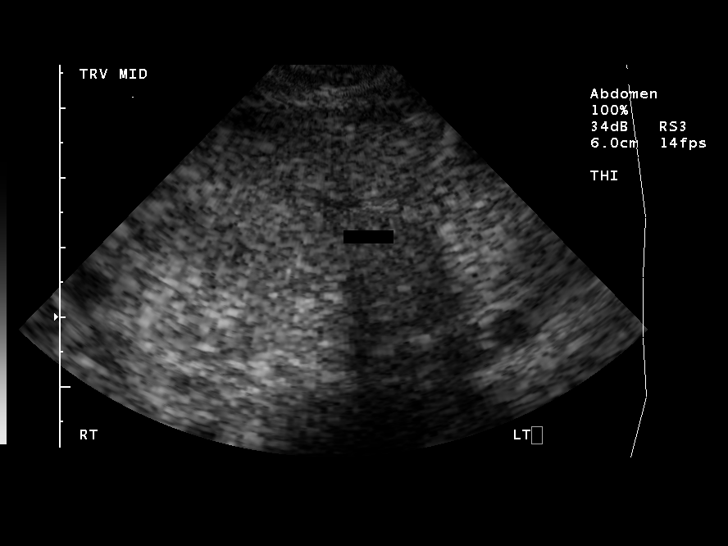
[im 16/30]
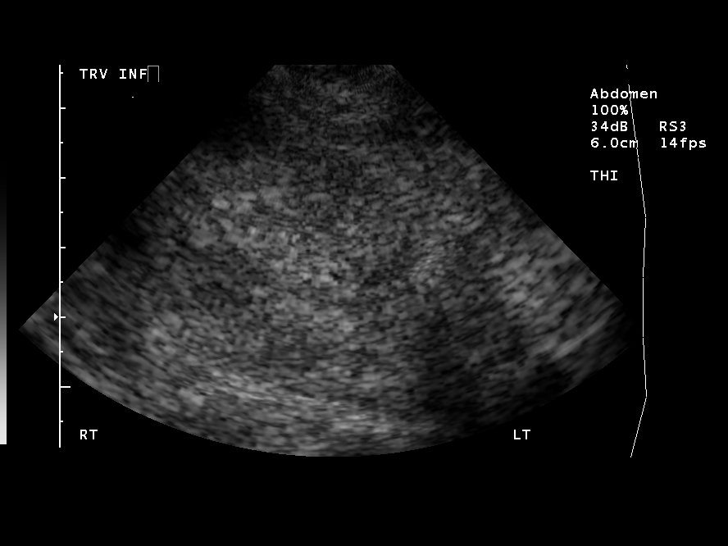
[im 19/30]
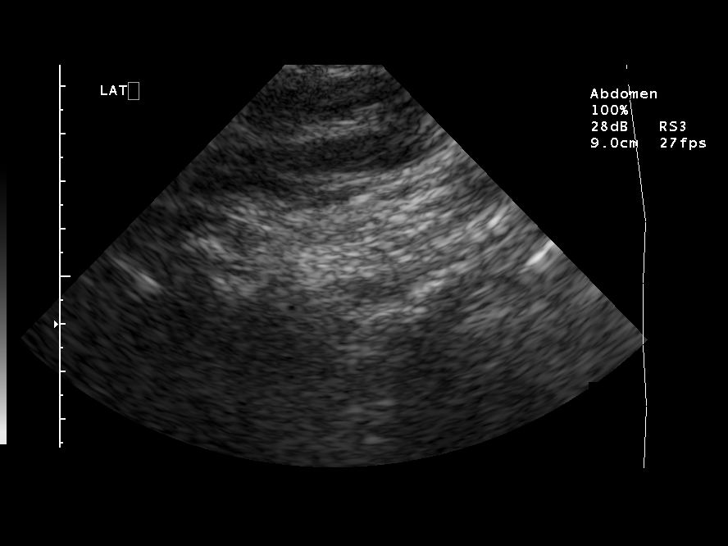
[im 20/30]
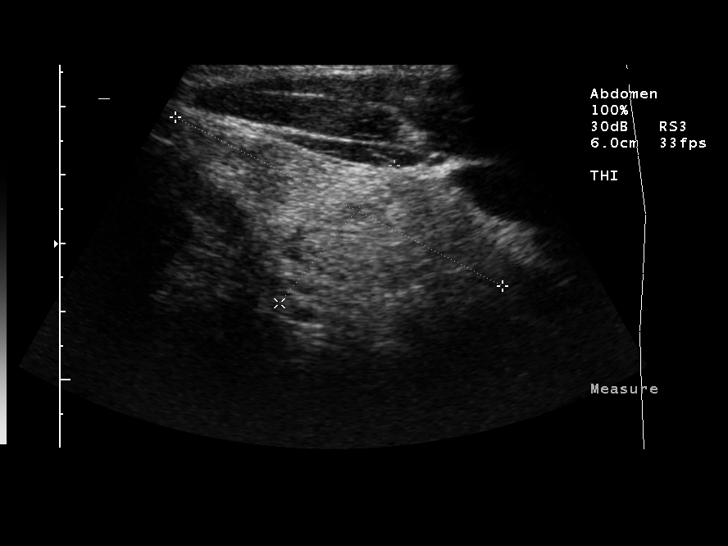
[im 22/30]
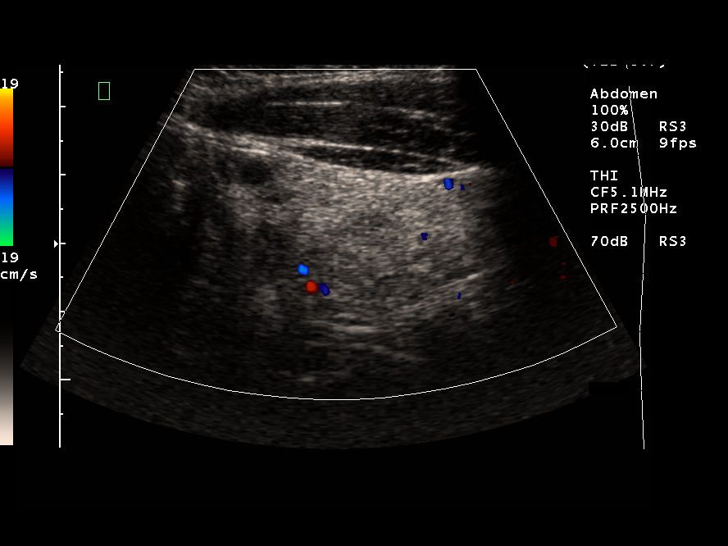
[im 25/30]
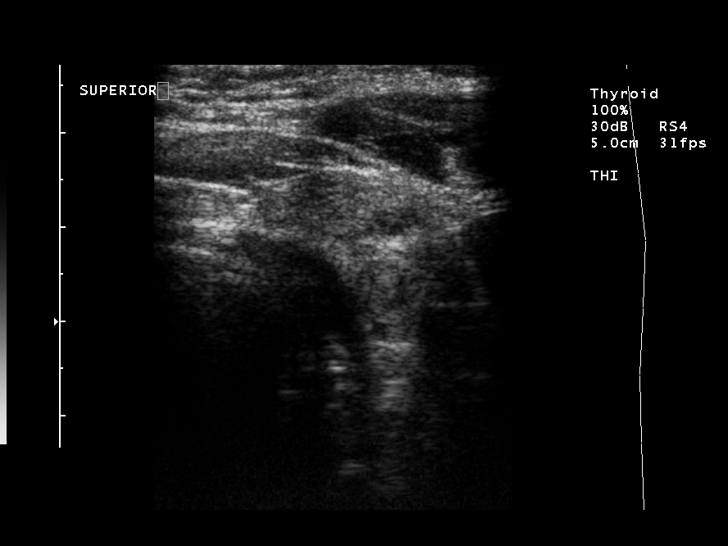
[im 27/30]
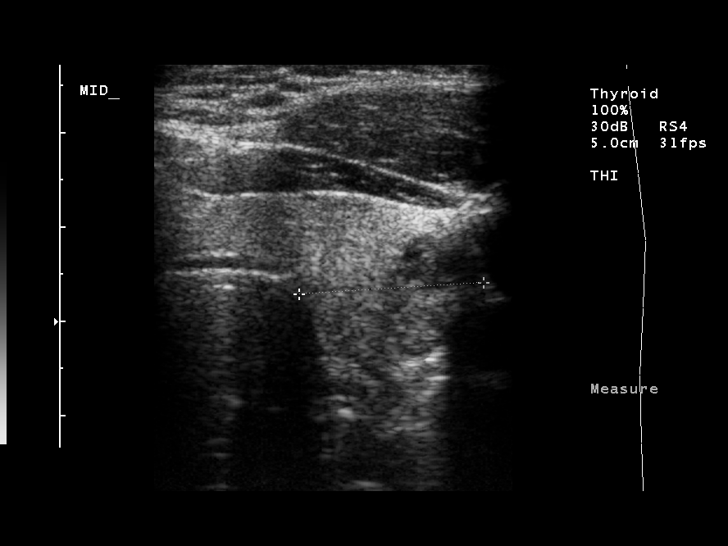
[im 30/30]
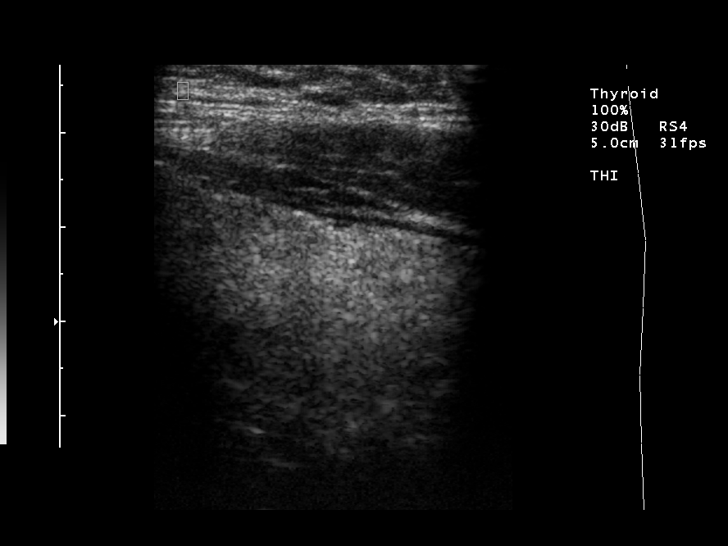

[14 of 25 positions shown; findings below may reference images not displayed]

THYROID ULTRASOUND:

Sonography of thyroid gland and regional soft tissues performed.

Right thyroid lobe enlarged 6.1 cm length x 3.1 cm AP x 3.8 cm transverse.
Left thyroid lobe enlarged, 5.4 cm length x 2.6 cm AP x 2.0 cm transverse.
Thyroid isthmus 6 mm thick, increased.
Diffuse bilateral enlargement of thyroid glands with diffuse inhomogeneous
echogenicity.
Pattern favors thyroiditis such as Hashimoto's thyroiditis.
No discrete or dominant mass identified.
Questionable hypoechoic nodules are seen in isthmus and left lobe.
IMPRESSION: Diffusely enlarged inhomogeneous thyroid gland bilaterally, pattern suggesting
thyroiditis such as DIBA.
No definite dominant mass identified, though several vague less than 1 cm
diameter nodular foci are noted at the left lobe and isthmus.
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
## 2008-01-17 ENCOUNTER — Ambulatory Visit: Payer: Self-pay | Admitting: Family Medicine

## 2008-01-20 ENCOUNTER — Encounter (INDEPENDENT_AMBULATORY_CARE_PROVIDER_SITE_OTHER): Payer: Self-pay | Admitting: *Deleted

## 2008-01-20 DIAGNOSIS — I10 Essential (primary) hypertension: Secondary | ICD-10-CM | POA: Insufficient documentation

## 2008-04-06 ENCOUNTER — Ambulatory Visit: Payer: Self-pay | Admitting: Family Medicine

## 2008-05-24 ENCOUNTER — Encounter: Payer: Self-pay | Admitting: Family Medicine

## 2008-06-17 ENCOUNTER — Emergency Department (HOSPITAL_COMMUNITY): Admission: EM | Admit: 2008-06-17 | Discharge: 2008-06-17 | Payer: Self-pay | Admitting: Emergency Medicine
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
## 2008-07-11 ENCOUNTER — Ambulatory Visit: Payer: Self-pay | Admitting: Family Medicine

## 2008-07-11 DIAGNOSIS — IMO0002 Reserved for concepts with insufficient information to code with codable children: Secondary | ICD-10-CM | POA: Insufficient documentation

## 2008-07-11 DIAGNOSIS — J111 Influenza due to unidentified influenza virus with other respiratory manifestations: Secondary | ICD-10-CM | POA: Insufficient documentation

## 2008-07-11 LAB — CONVERTED CEMR LAB
Basophils Absolute: 0 10*3/uL (ref 0.0–0.1)
Basophils Relative: 0 % (ref 0–1)
Eosinophils Absolute: 0.1 10*3/uL (ref 0.0–0.7)
Eosinophils Relative: 1 % (ref 0–5)
HCT: 44.8 % (ref 36.0–46.0)
Hemoglobin: 14.4 g/dL (ref 12.0–15.0)
Lymphocytes Relative: 16 % (ref 12–46)
Lymphs Abs: 1.3 10*3/uL (ref 0.7–4.0)
MCHC: 32.1 g/dL (ref 30.0–36.0)
MCV: 92.9 fL (ref 78.0–100.0)
Monocytes Absolute: 0.9 10*3/uL (ref 0.1–1.0)
Monocytes Relative: 11 % (ref 3–12)
Neutro Abs: 5.7 10*3/uL (ref 1.7–7.7)
Neutrophils Relative %: 72 % (ref 43–77)
Platelets: 251 10*3/uL (ref 150–400)
RBC: 4.82 M/uL (ref 3.87–5.11)
RDW: 12.9 % (ref 11.5–15.5)
WBC: 8 10*3/uL (ref 4.0–10.5)

## 2008-09-26 ENCOUNTER — Telehealth: Payer: Self-pay | Admitting: Family Medicine

## 2009-01-02 ENCOUNTER — Telehealth: Payer: Self-pay | Admitting: Family Medicine

## 2009-01-02 ENCOUNTER — Telehealth (INDEPENDENT_AMBULATORY_CARE_PROVIDER_SITE_OTHER): Payer: Self-pay | Admitting: *Deleted

## 2009-01-04 ENCOUNTER — Ambulatory Visit: Payer: Self-pay | Admitting: Family Medicine

## 2009-01-04 DIAGNOSIS — R5381 Other malaise: Secondary | ICD-10-CM | POA: Insufficient documentation

## 2009-01-04 DIAGNOSIS — N946 Dysmenorrhea, unspecified: Secondary | ICD-10-CM | POA: Insufficient documentation

## 2009-01-04 DIAGNOSIS — R5383 Other fatigue: Secondary | ICD-10-CM

## 2009-01-05 ENCOUNTER — Encounter: Payer: Self-pay | Admitting: Family Medicine

## 2009-03-09 ENCOUNTER — Telehealth: Payer: Self-pay | Admitting: Family Medicine

## 2009-03-13 ENCOUNTER — Telehealth: Payer: Self-pay | Admitting: Family Medicine

## 2009-04-17 ENCOUNTER — Ambulatory Visit: Payer: Self-pay | Admitting: Family Medicine

## 2009-04-17 LAB — CONVERTED CEMR LAB: Beta hcg, urine, semiquantitative: POSITIVE

## 2009-05-01 ENCOUNTER — Telehealth: Payer: Self-pay | Admitting: Family Medicine

## 2009-05-03 ENCOUNTER — Encounter: Payer: Self-pay | Admitting: Family Medicine

## 2009-05-15 ENCOUNTER — Other Ambulatory Visit: Admission: RE | Admit: 2009-05-15 | Discharge: 2009-05-15 | Payer: Self-pay | Admitting: Obstetrics & Gynecology

## 2009-08-20 ENCOUNTER — Other Ambulatory Visit: Payer: Self-pay | Admitting: Obstetrics and Gynecology

## 2009-08-21 ENCOUNTER — Other Ambulatory Visit: Payer: Self-pay | Admitting: Emergency Medicine

## 2009-08-21 ENCOUNTER — Ambulatory Visit: Payer: Self-pay | Admitting: Obstetrics & Gynecology

## 2009-08-21 ENCOUNTER — Inpatient Hospital Stay (HOSPITAL_COMMUNITY): Admission: AD | Admit: 2009-08-21 | Discharge: 2009-08-23 | Payer: Self-pay | Admitting: Obstetrics & Gynecology

## 2009-08-21 IMAGING — US US OB COMP +14 WK
1 series · 14 of 28 positions shown · non-contrast
Comparison: none

OBSTETRICAL ULTRASOUND:
 This ultrasound exam was performed in the [HOSPITAL] Ultrasound Department.  The OB US report was generated in the AS system, and faxed to the ordering physician.  This report is also available in [HOSPITAL]?s [employer] and in [REDACTED] PACS.

[Series 1: us ob comp +14 wk · 0.24mm/px · 14 of 52 slices shown]
[im 2/52]
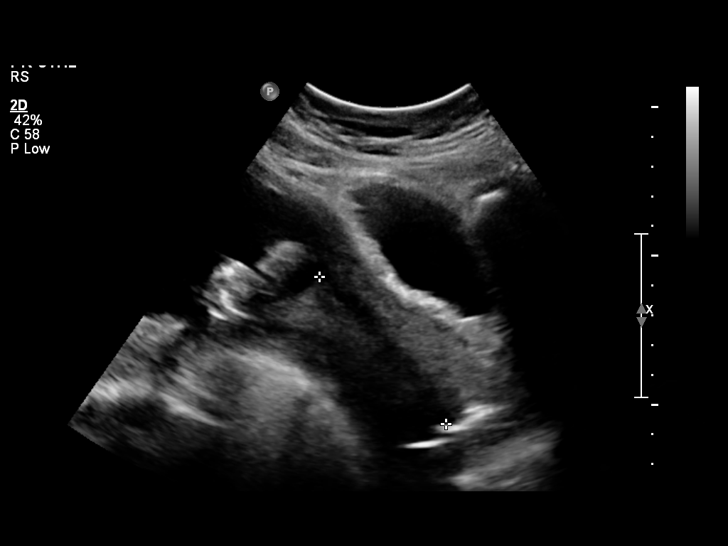
[im 6/52]
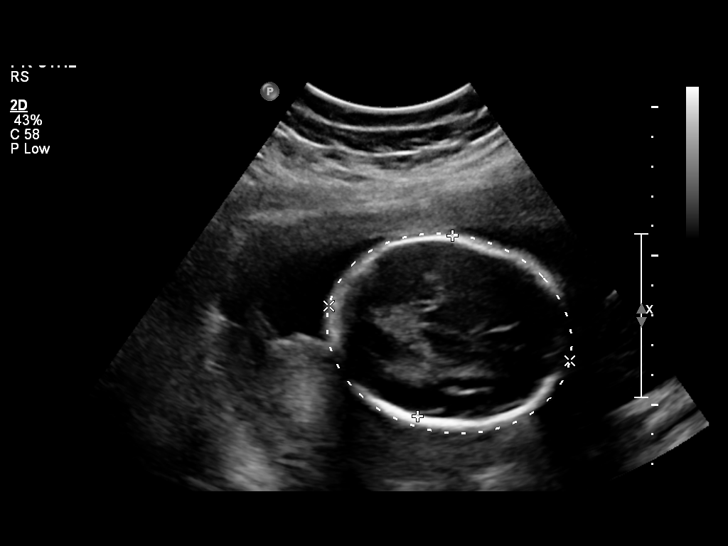
[im 10/52]
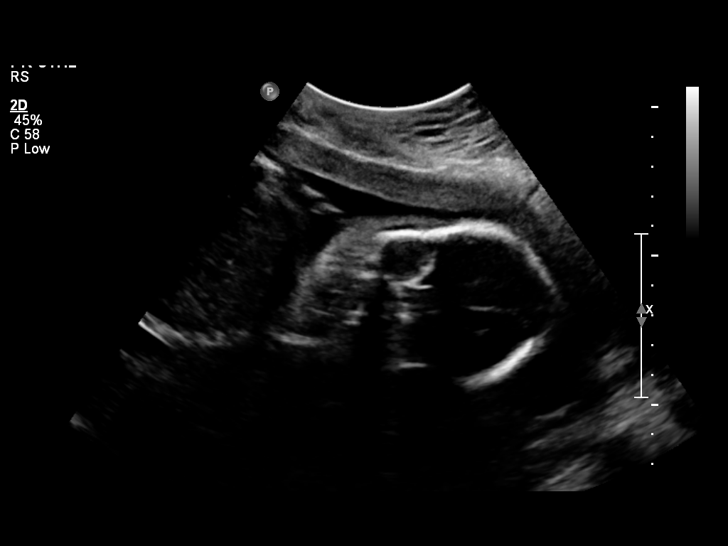
[im 14/52]
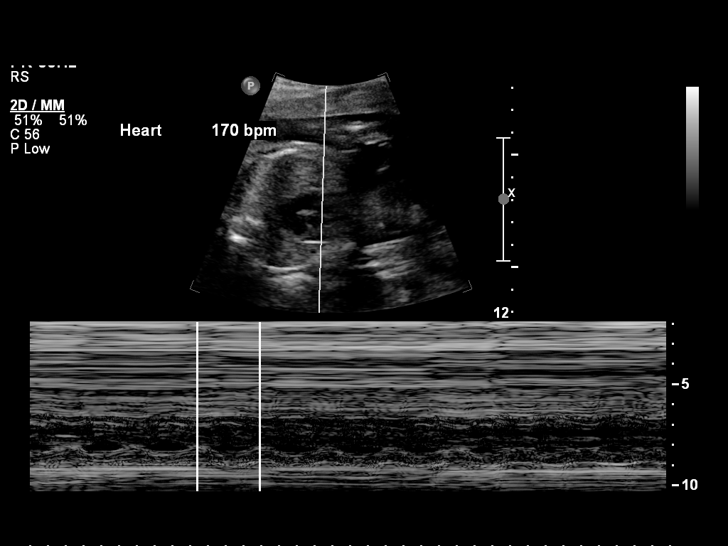
[im 18/52]
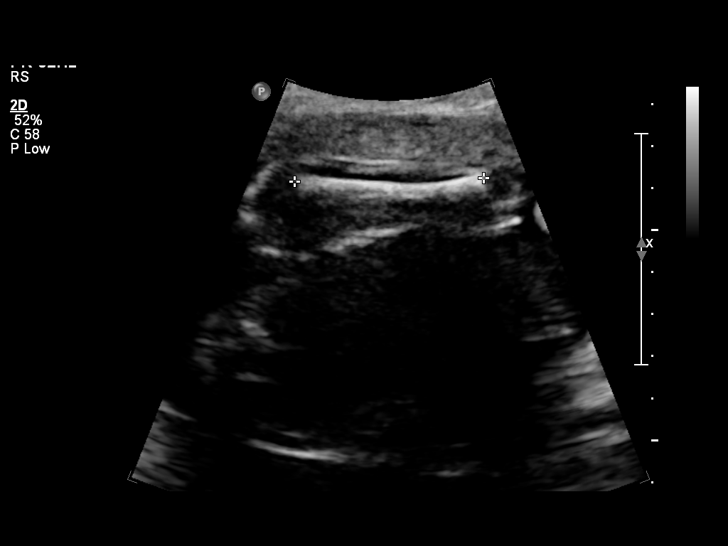
[im 21/52]
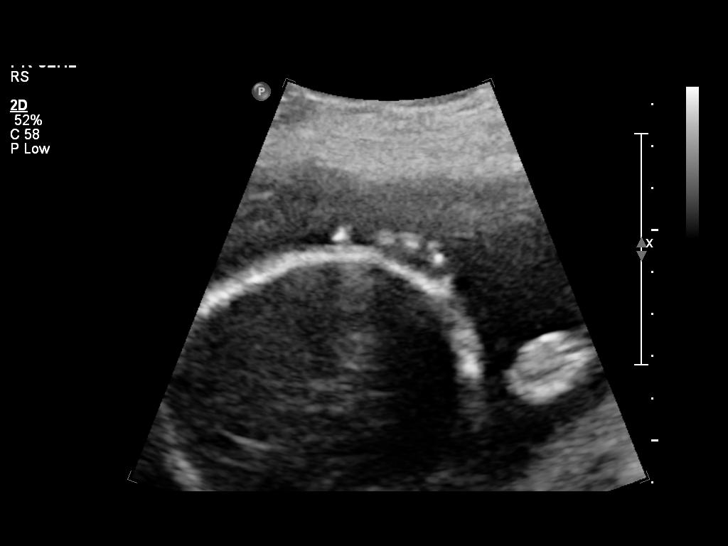
[im 25/52]
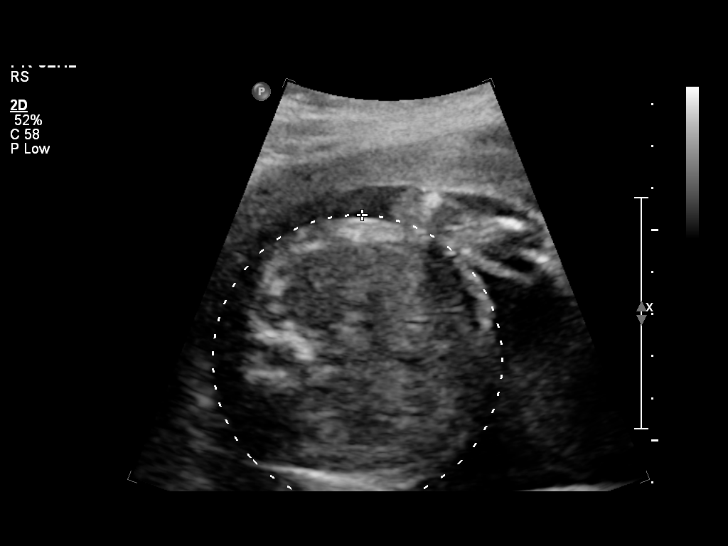
[im 29/52]
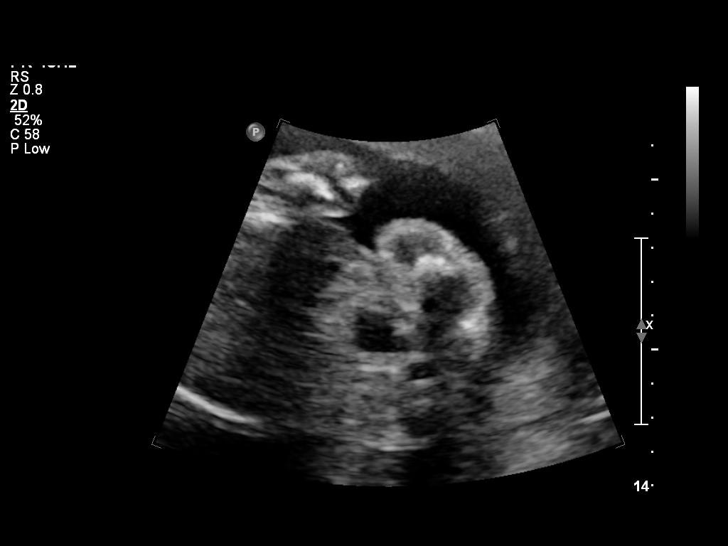
[im 33/52]
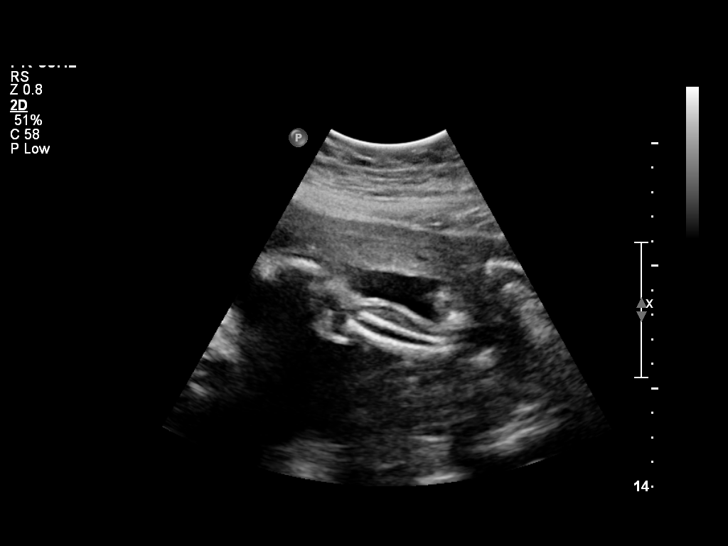
[im 36/52]
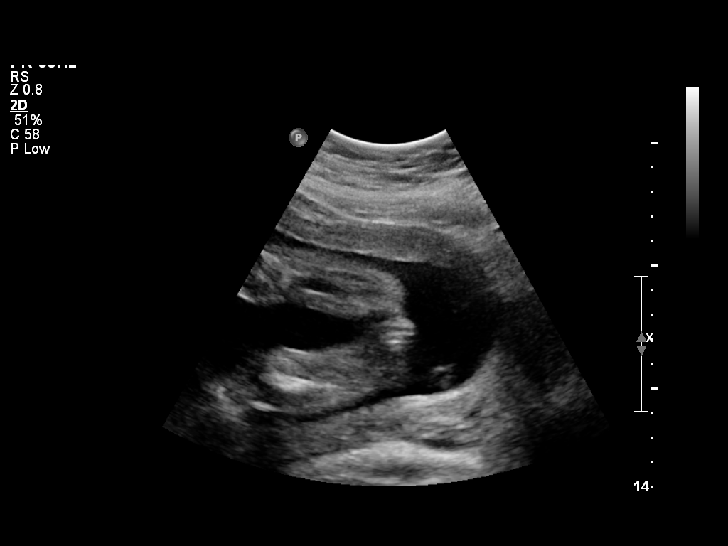
[im 40/52]
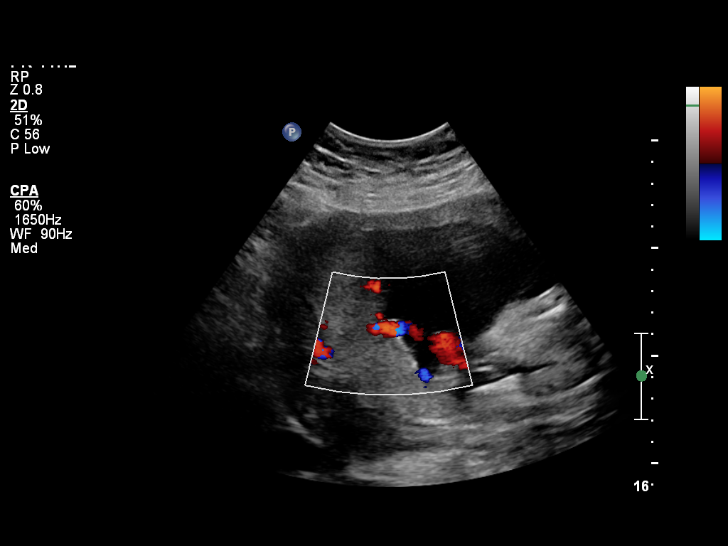
[im 44/52]
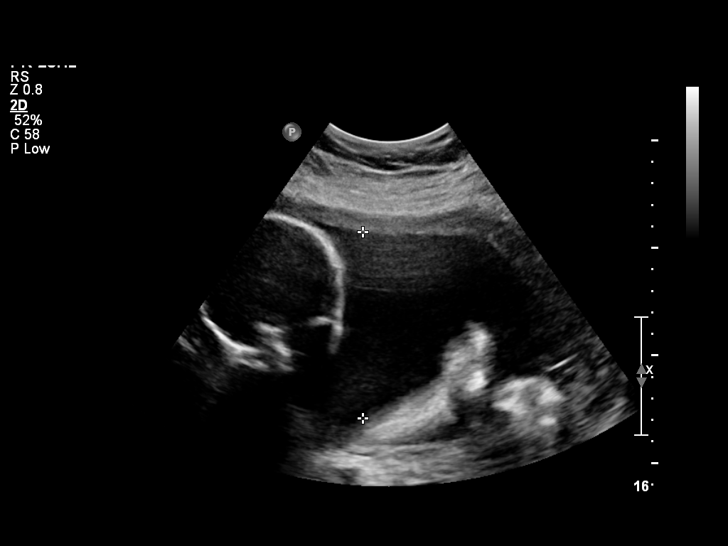
[im 48/52]
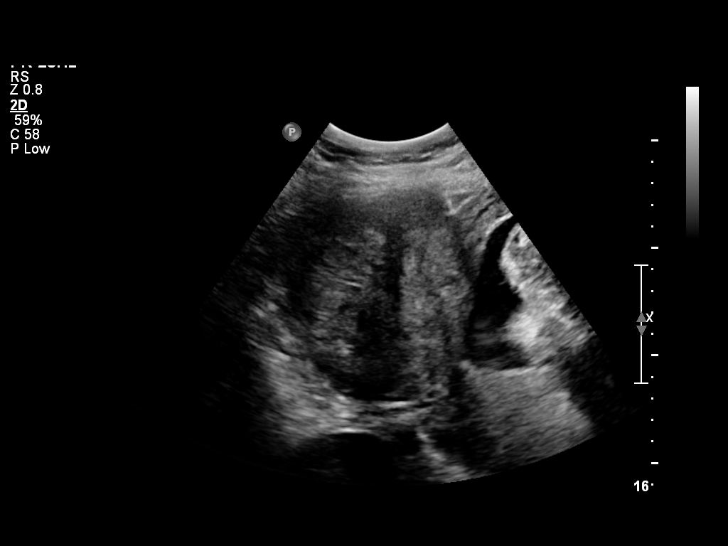
[im 52/52]
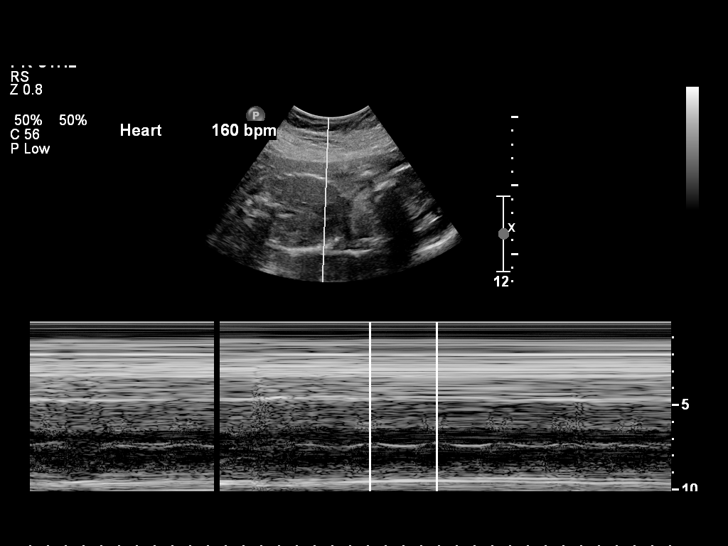

[14 of 28 positions shown; findings below may reference images not displayed]

IMPRESSION: See AS Obstetric US report.

## 2009-08-30 ENCOUNTER — Inpatient Hospital Stay (HOSPITAL_COMMUNITY): Admission: AD | Admit: 2009-08-30 | Discharge: 2009-08-30 | Payer: Self-pay | Admitting: Obstetrics & Gynecology

## 2009-08-30 ENCOUNTER — Ambulatory Visit: Payer: Self-pay | Admitting: Obstetrics and Gynecology

## 2009-10-13 HISTORY — PX: TUBAL LIGATION: SHX77

## 2009-10-13 HISTORY — PX: OTHER SURGICAL HISTORY: SHX169

## 2009-10-15 ENCOUNTER — Inpatient Hospital Stay (HOSPITAL_COMMUNITY): Admission: AD | Admit: 2009-10-15 | Discharge: 2009-10-15 | Payer: Self-pay | Admitting: Obstetrics & Gynecology

## 2009-10-15 IMAGING — US US FETAL BPP W/O NONSTRESS
1 series · 14 of 17 positions shown · non-contrast
Comparison: none

CLINICAL DATA: Abnormal biophysical profile score of [DATE].

BIOPHYSICAL PROFILE
Number of Fetuses: 1
Heart Rate: 149 bpm
Presentation: Cephalic
Movement: Observed
Breathing: Observer
Tone: Left observed
Amniotic Fluid: Subjectively within normal limits
Total Score: [DATE]
CLINICAL DATA: Normal biophysical profile score in the [facility] of
[DATE]
FETAL UMBILICAL ARTERY DOPPLER ULTRASOUND
Umbilical Artery S/D Ratio: 3.1 (73rd percentile).

[Series 1: us fetal bpp w/o nonstress · non-contrast · 0.24mm/px · 17 acquisitions, 14 frames shown]
[im 1/17]
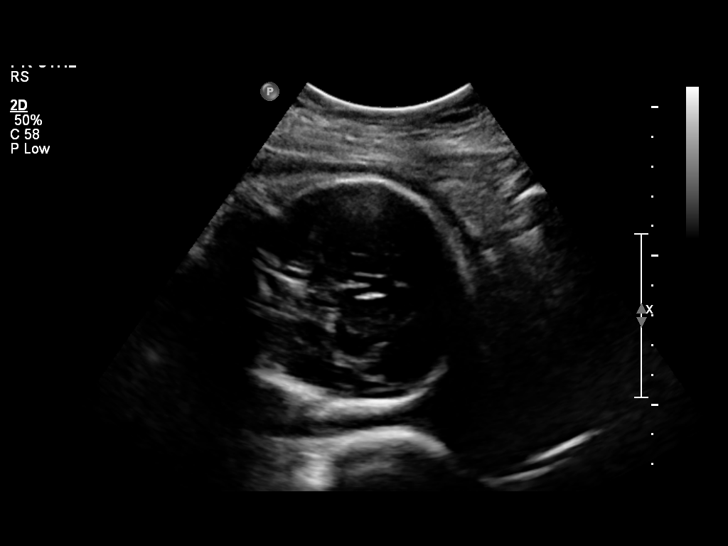
[im 2/17]
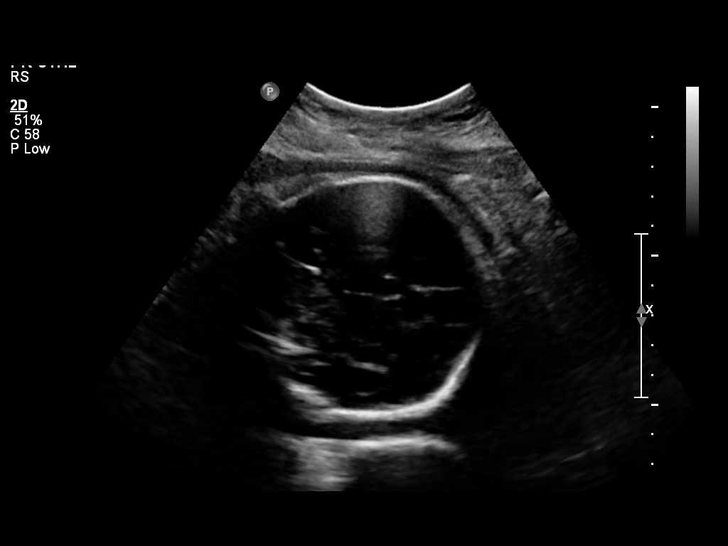
[im 4/17]
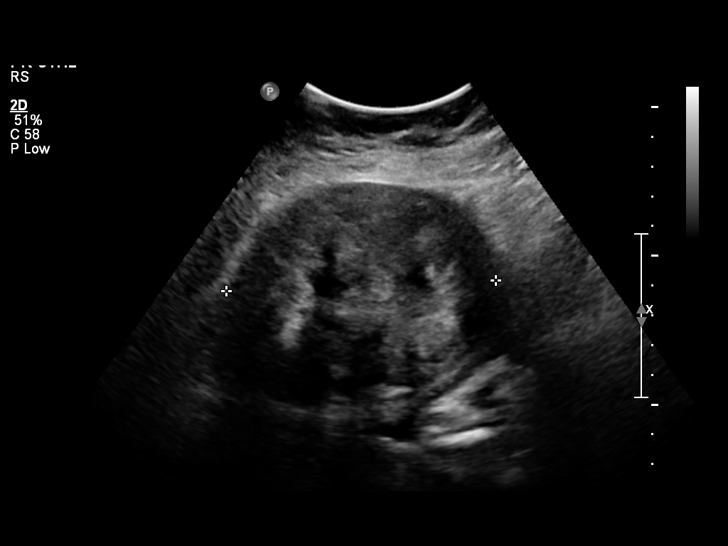
[im 5/17]
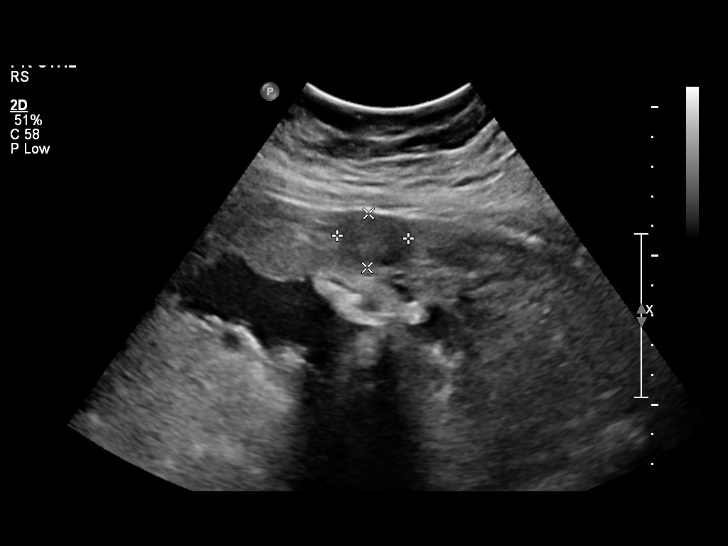
[im 6/17]
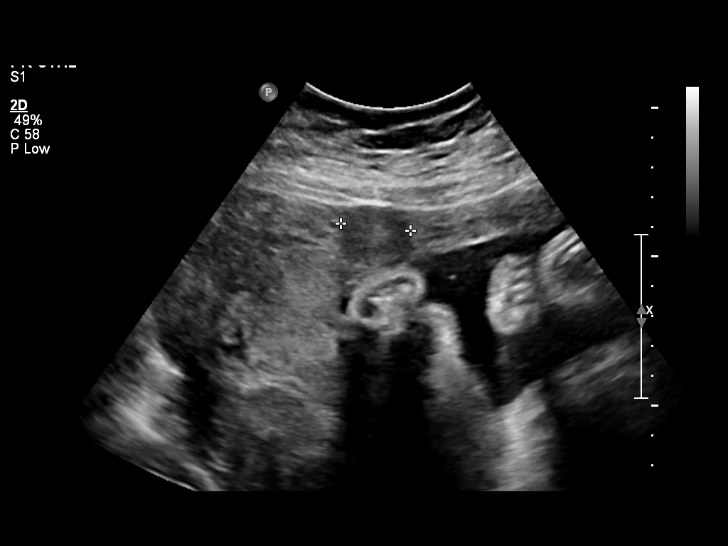
[im 7/17]
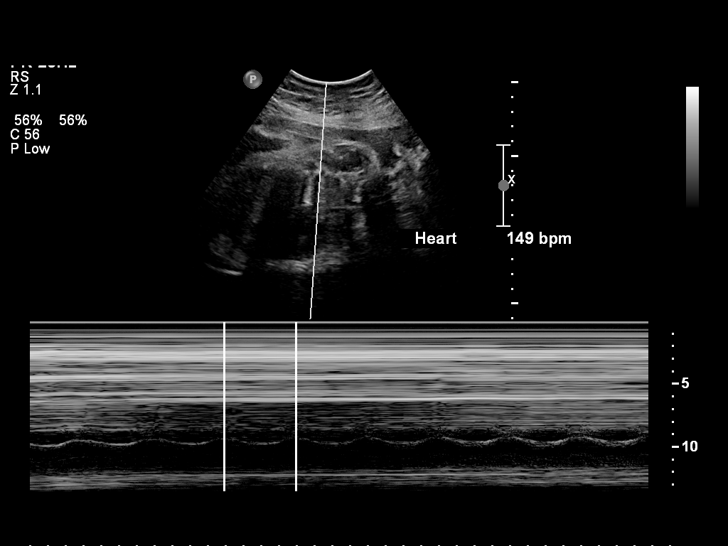
[im 8/17]
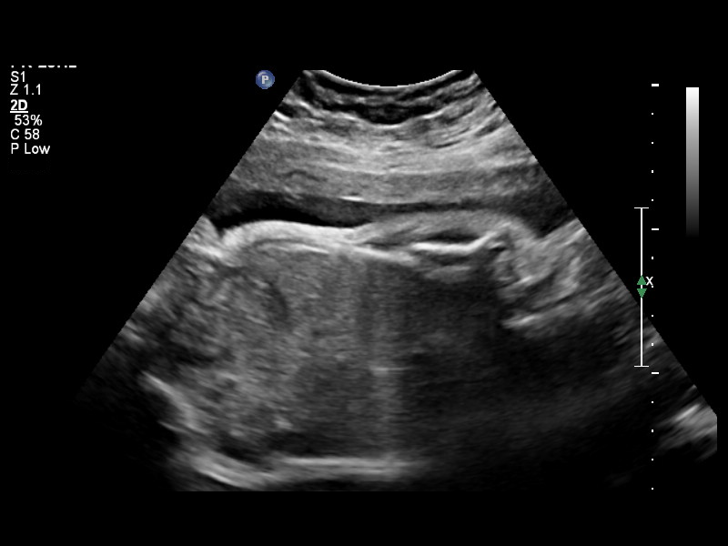
[im 10/17]
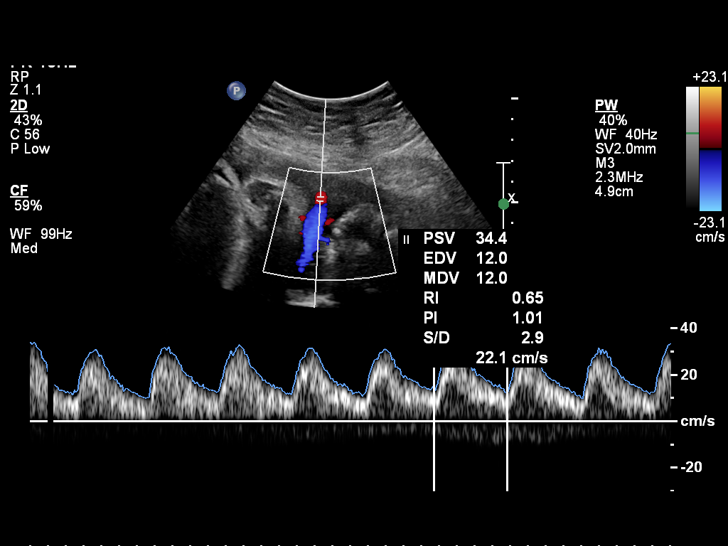
[im 11/17]
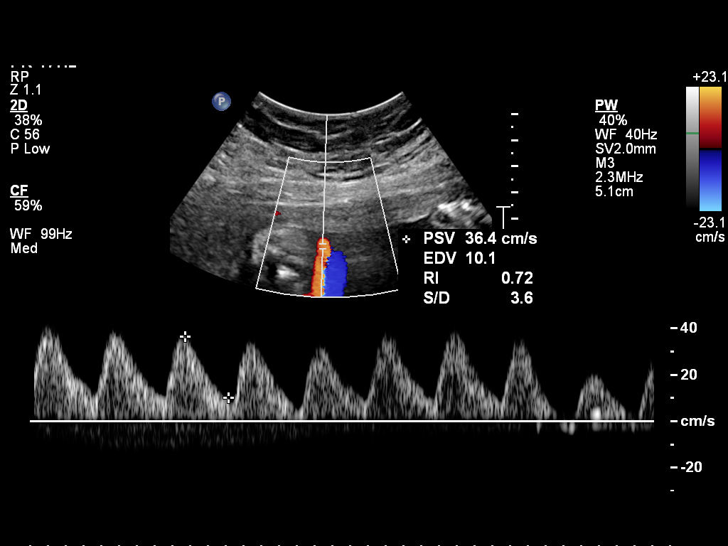
[im 12/17]
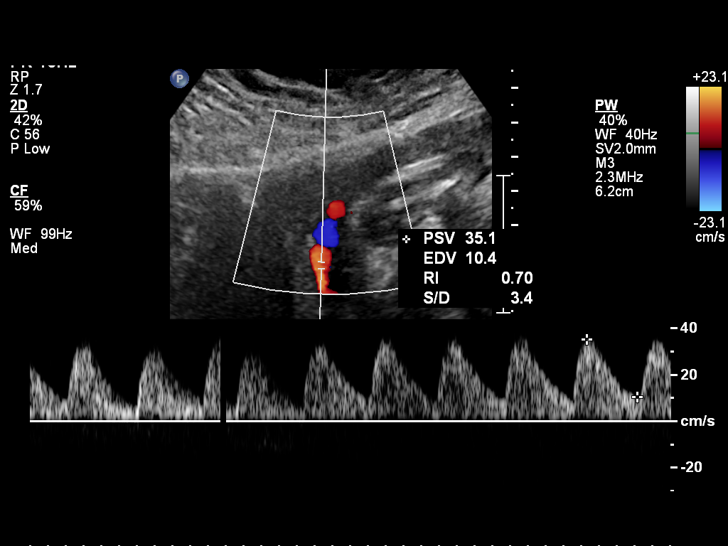
[im 13/17]
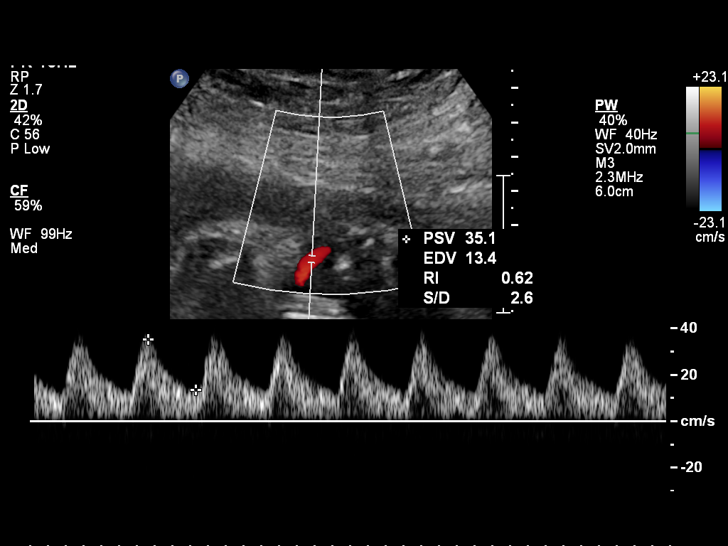
[im 14/17]
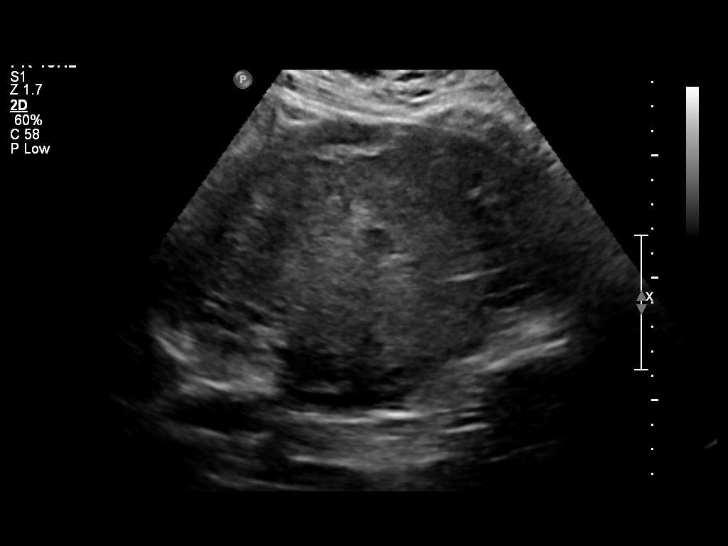
[im 16/17]
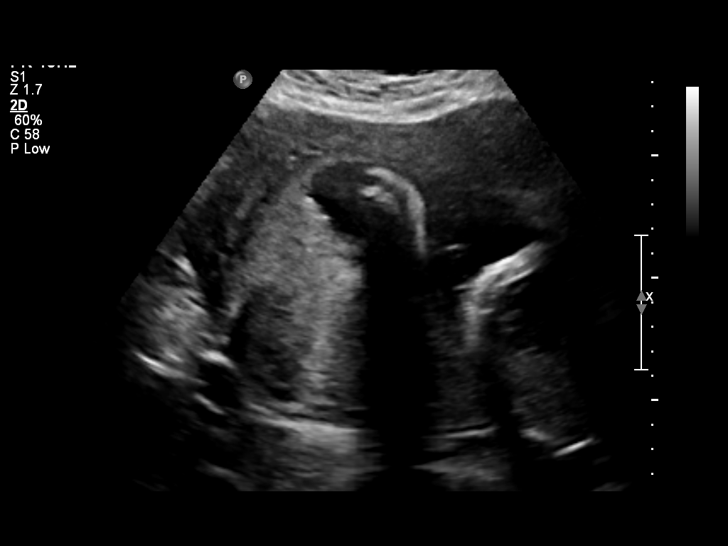
[im 17/17]
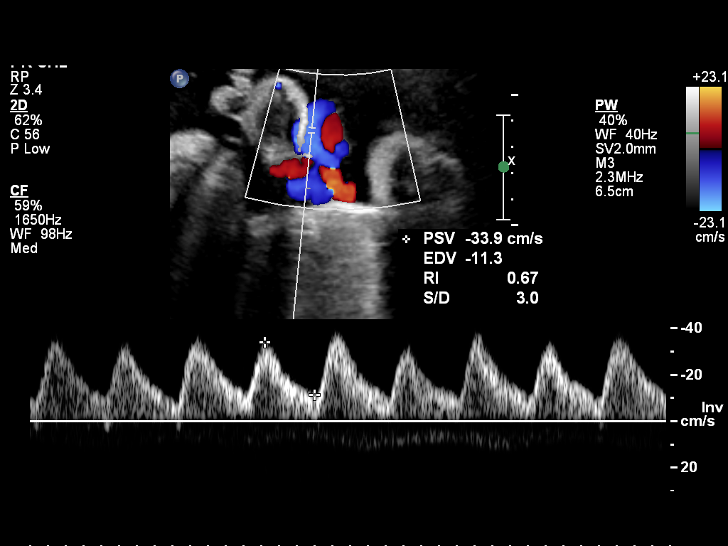

[14 of 17 positions shown; findings below may reference images not displayed]

IMPRESSION: Normal biophysical profile score.
IMPRESSION: Umbilical artery S/D ratio is 3.1, 73rd percentile.

## 2009-10-24 ENCOUNTER — Ambulatory Visit: Payer: Self-pay | Admitting: Family

## 2009-10-24 ENCOUNTER — Inpatient Hospital Stay (HOSPITAL_COMMUNITY): Admission: AD | Admit: 2009-10-24 | Discharge: 2009-10-28 | Payer: Self-pay | Admitting: Obstetrics and Gynecology

## 2009-10-24 IMAGING — US US OB FOLLOW-UP
1 series · 14 of 28 positions shown · non-contrast
Comparison: none

OBSTETRICAL ULTRASOUND:
 This ultrasound exam was performed in the [HOSPITAL] Ultrasound Department.  The OB US report was generated in the AS system, and faxed to the ordering physician.  This report is also available in [HOSPITAL]?s [employer] and in [REDACTED] PACS.

[Series 1: us ob follow up · 0.24mm/px · 14 of 30 slices shown]
[im 2/30]
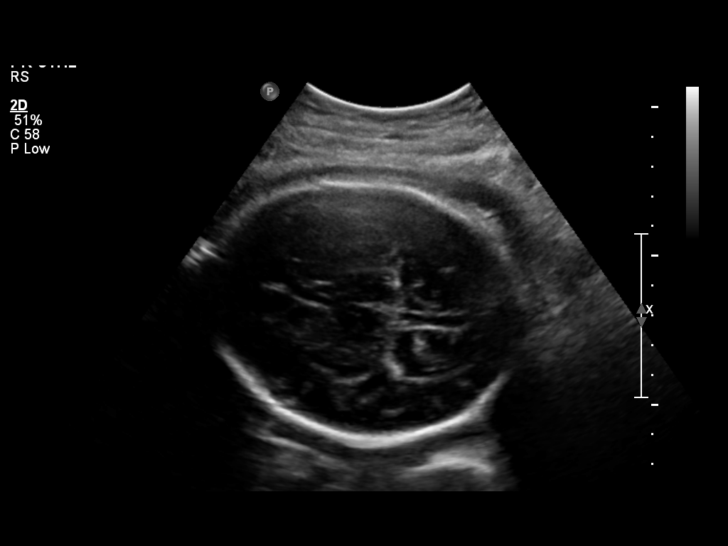
[im 4/30]
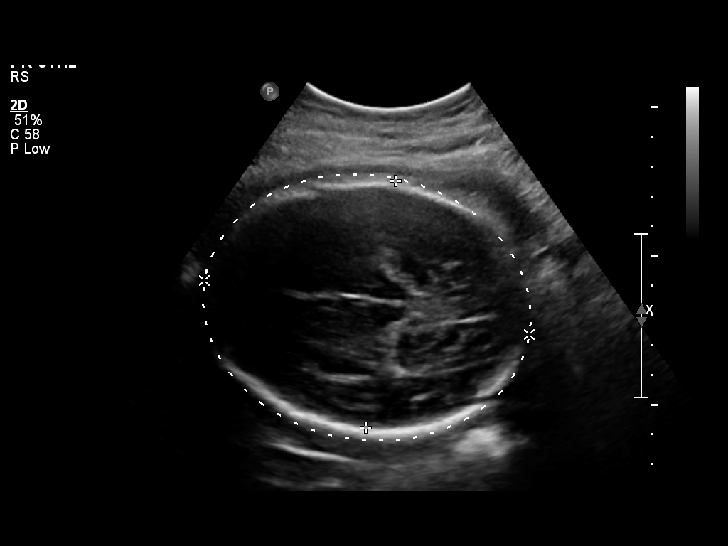
[im 6/30]
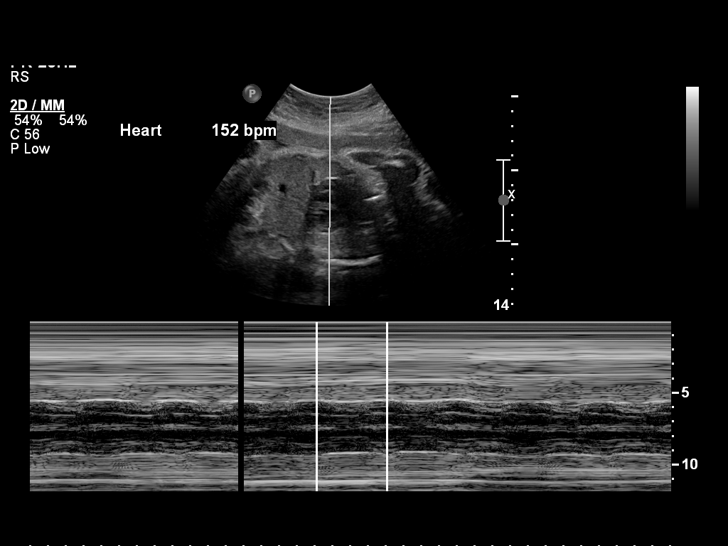
[im 8/30]
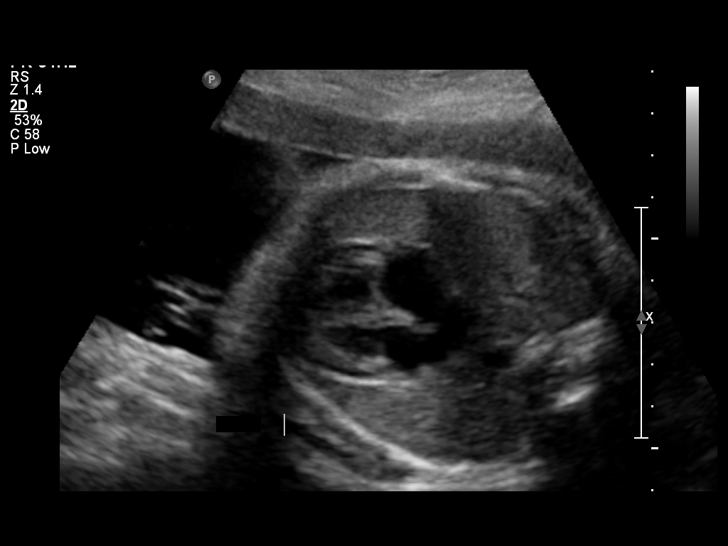
[im 10/30]
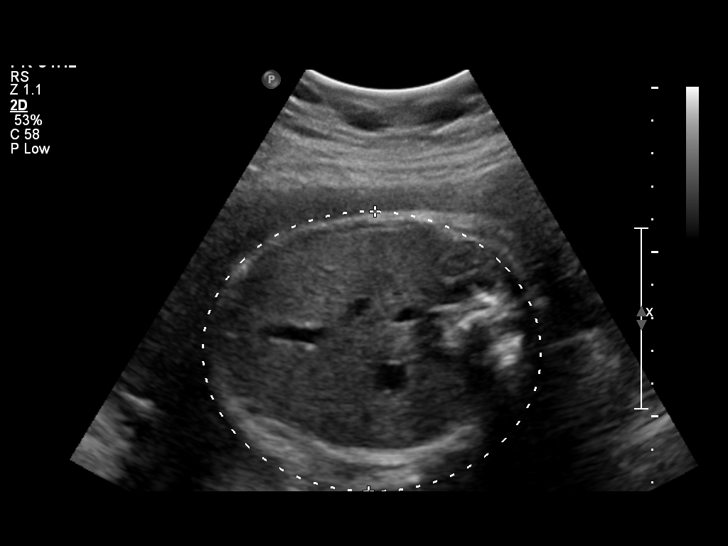
[im 12/30]
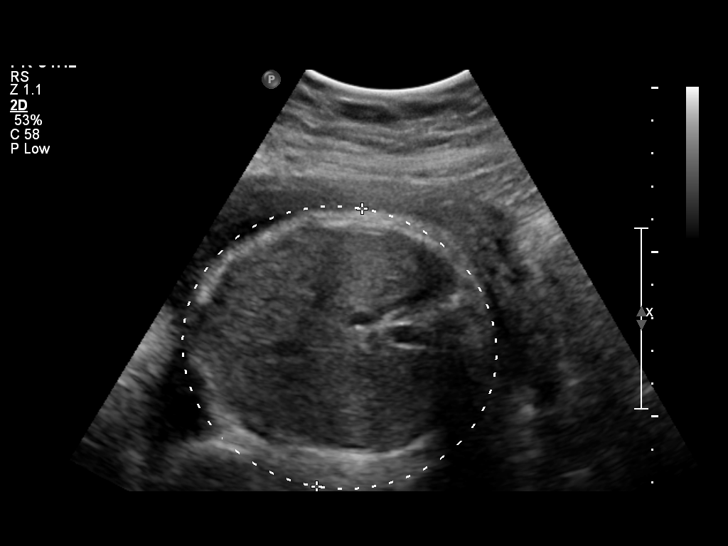
[im 14/30]
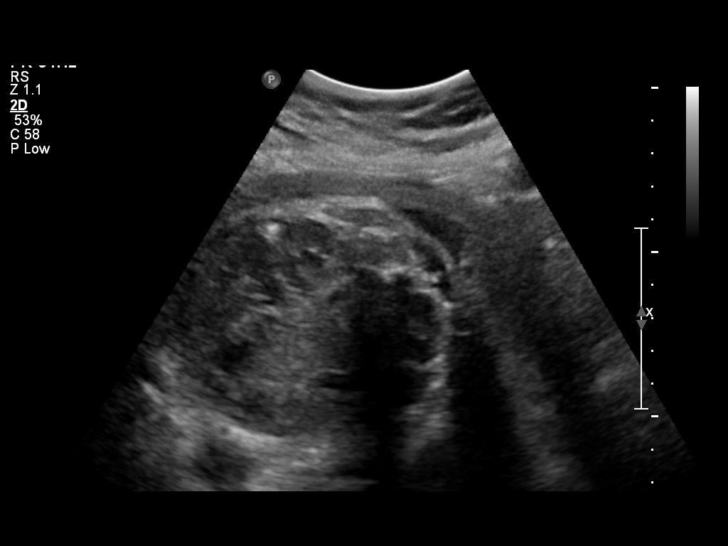
[im 17/30]
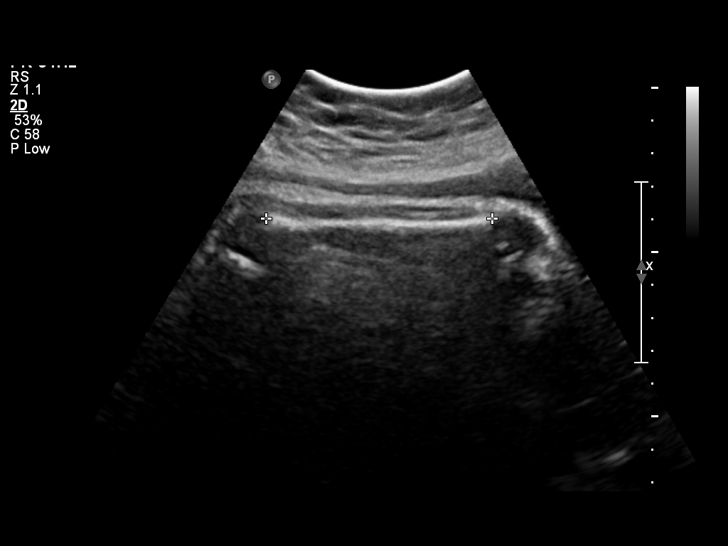
[im 19/30]
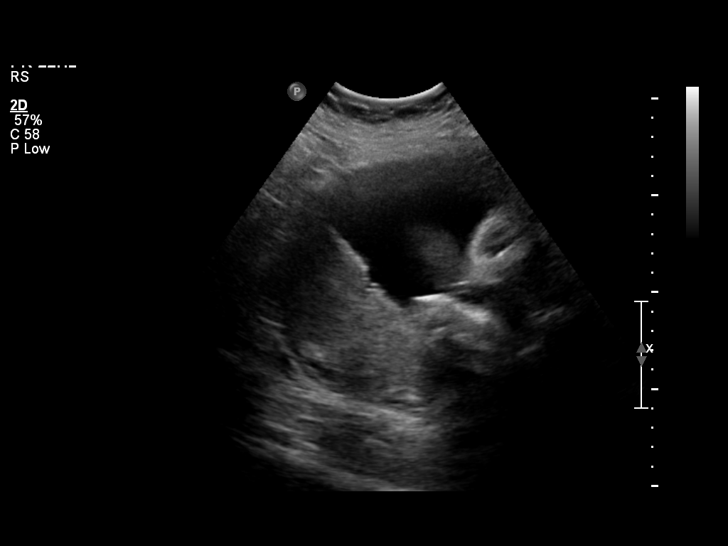
[im 21/30]
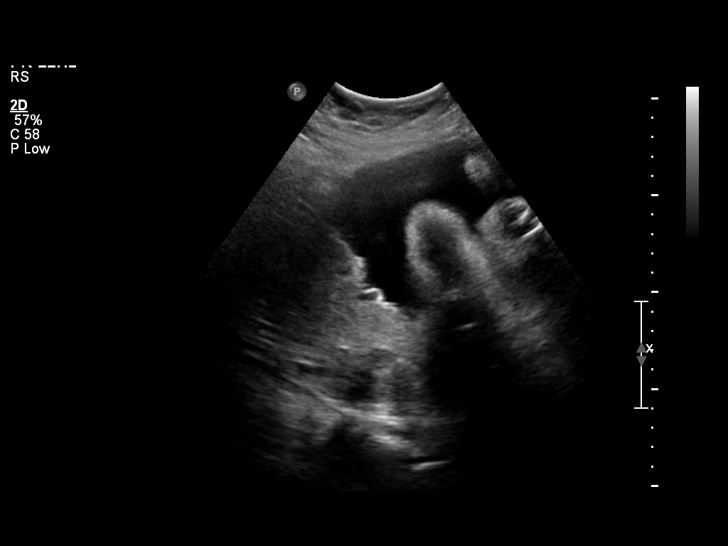
[im 23/30]
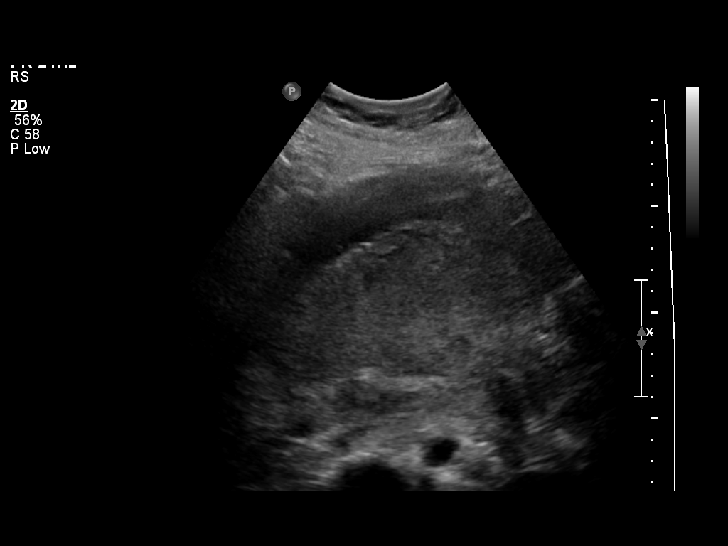
[im 25/30]
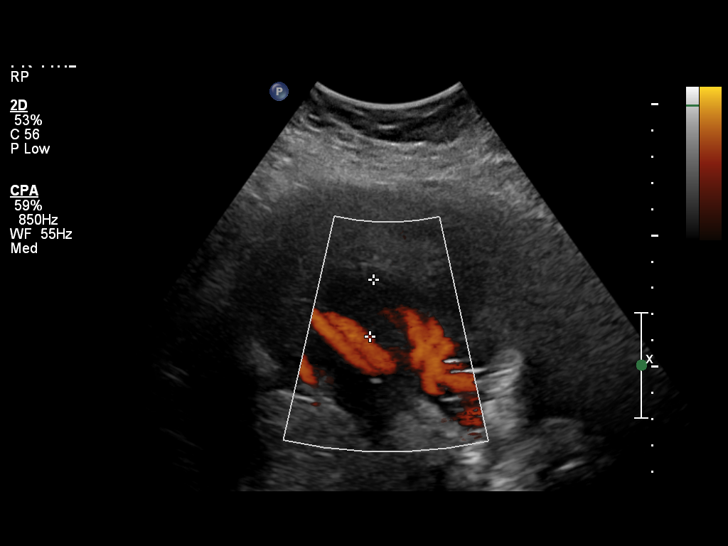
[im 27/30]
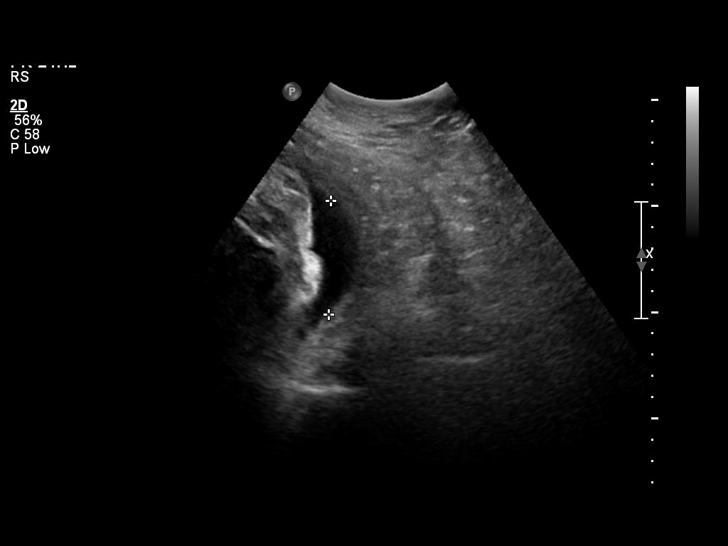
[im 30/30]
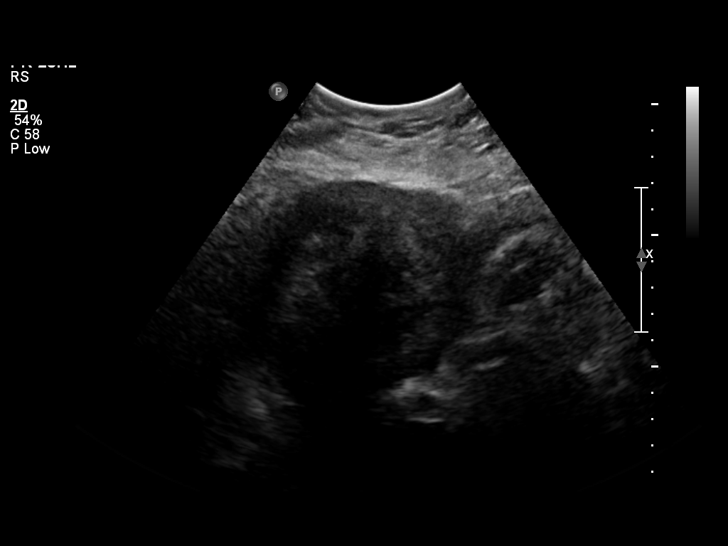

[14 of 28 positions shown; findings below may reference images not displayed]

IMPRESSION: See AS Obstetric US report.

## 2009-11-06 ENCOUNTER — Inpatient Hospital Stay (HOSPITAL_COMMUNITY): Admission: AD | Admit: 2009-11-06 | Discharge: 2009-11-06 | Payer: Self-pay | Admitting: Obstetrics & Gynecology

## 2009-11-06 ENCOUNTER — Ambulatory Visit: Payer: Self-pay | Admitting: Family

## 2010-01-08 ENCOUNTER — Telehealth: Payer: Self-pay | Admitting: Family Medicine

## 2010-01-08 ENCOUNTER — Ambulatory Visit: Payer: Self-pay | Admitting: Family Medicine

## 2010-01-08 DIAGNOSIS — T7840XA Allergy, unspecified, initial encounter: Secondary | ICD-10-CM | POA: Insufficient documentation

## 2010-04-24 ENCOUNTER — Encounter: Payer: Self-pay | Admitting: Family Medicine

## 2010-10-02 ENCOUNTER — Emergency Department (HOSPITAL_COMMUNITY)
Admission: EM | Admit: 2010-10-02 | Discharge: 2010-10-02 | Payer: Self-pay | Source: Home / Self Care | Admitting: Emergency Medicine
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
## 2010-10-20 ENCOUNTER — Emergency Department (HOSPITAL_COMMUNITY)
Admission: EM | Admit: 2010-10-20 | Discharge: 2010-10-20 | Payer: Self-pay | Source: Home / Self Care | Admitting: Emergency Medicine

## 2010-10-25 ENCOUNTER — Ambulatory Visit
Admission: RE | Admit: 2010-10-25 | Discharge: 2010-10-25 | Payer: Self-pay | Source: Home / Self Care | Attending: Family Medicine | Admitting: Family Medicine

## 2010-10-25 ENCOUNTER — Encounter: Payer: Self-pay | Admitting: Family Medicine

## 2010-11-12 NOTE — Letter (Signed)
Summary: Letter  Letter   Imported By: Lind Guest 05/03/2009 14:56:39  _____________________________________________________________________  External Attachment:    Type:   Image     Comment:   External Document

## 2010-11-12 NOTE — Assessment & Plan Note (Signed)
Summary: OV   Vital Signs:  Patient profile:   40 year old female Menstrual status:  regular LMP:     01/04/2009 Height:      64 inches (162.56 cm) Weight:      254 pounds (115.45 kg) BMI:     43.76 BSA:     2.17 O2 Sat:      95 % Pulse rate:   110 / minute Resp:     16 per minute BP sitting:   150 / 110  (left arm)  Vitals Entered By: Everitt Amber (January 04, 2009 4:28 PM)  Nutrition Counseling: Patient's BMI is greater than 25 and therefore counseled on weight management options. 150Pain Assessment Patient in pain? yes     Location: abdomen Intensity: 10 Type: crampy Onset of pain  2 months LMP (date): 01/04/2009  years   days  Menstrual Status regular Enter LMP: 01/04/2009 Last PAP Result Normal   History of Present Illness: uncontrolled cramps for the last 3 months, wants codeine .  Out of norvasc for the past 1 week, denies any cardiovascular symptoms.  ms. Heyer has been increasingly consistent in changing her diet as well as exercising at leest 3 times weekly, with good results in weight loss, she is applauded on this and encouraged to continue same. she denies any recentfever or chills. She denies dpression or anxiety.   Preventive Screening-Counseling & Management     Smoking Status: never  Allergies (verified): No Known Drug Allergies  Review of Systems General:  Denies chills, fatigue, and fever. ENT:  Denies earache, hoarseness, nasal congestion, sinus pressure, and sore throat. CV:  Denies chest pain or discomfort, palpitations, shortness of breath with exertion, and swelling of feet. Resp:  Denies cough, shortness of breath, and sputum productive. GI:  See HPI; Complains of abdominal pain; denies constipation, diarrhea, loss of appetite, nausea, and vomiting; menstrual cramps. GU:  See HPI; Denies dysuria and urinary frequency. MS:  Denies joint pain and muscle weakness.  Physical Exam  General:  alert, well-hydrated, and overweight-appearing.   HEENT: No facial asymmetry,  EOMI, No sinus tenderness, TM's Clear, oropharynx  pink and moist.   Chest: Clear to auscultation bilaterally.  CVS: S1, S2, No murmurs, No S3.   Abd: Soft, Nontender.  MS: Adequate ROM spine, hips, shoulders and knees.  Ext: No edema.   CNS: CN 2-12 intact, power tone and sensation normal throughout.   Skin: Intact, no visible lesions or rashes.  Psych: Good eye contact, normal affect.  Memory intact, not anxious or depressed appearing.     Impression & Recommendations:  Problem # 1:  DYSMENORRHEA, SEVERE (ICD-625.3) Assessment Deteriorated ibuprofen three times daily for 2 dysbefore onset of next menses and for the first days. Vicodin given for uncontrolled pain.  Problem # 2:  OBESITY (ICD-278.00) Assessment: Comment Only  Ht: 64 (01/04/2009)   Wt: 254 (01/04/2009)   BMI: 43.76 (01/04/2009)  Problem # 3:  HYPERTENSION (ICD-401.9) Assessment: Deteriorated  Her updated medication list for this problem includes:    Clonidine Hcl 0.3 Mg Tabs (Clonidine hcl) ..... One tab by mouth at 7am, one half tab by mouth at 2pm, and one tab by mouth at 10pm    Maxzide 75-50 Mg Tabs (Triamterene-hctz) ..... One tab by mouth once daily    Norvasc 10 Mg Tabs (Amlodipine besylate) .Marland Kitchen... Take 1 tablet by mouth once a day  Orders: T-Basic Metabolic Panel [phone])  BP today: 150/110 Prior BP: 140/90 (07/11/2008)  Labs Reviewed: K+:  4.0 (07/12/2007) Creat: : 0.88 (07/12/2007)   Chol: 186 (07/12/2007)   HDL: 68 (07/12/2007)   LDL: 94 (07/12/2007)   TG: 118 (07/12/2007)  Complete Medication List: 1)  Clonidine Hcl 0.3 Mg Tabs (Clonidine hcl) .... One tab by mouth at 7am, one half tab by mouth at 2pm, and one tab by mouth at 10pm 2)  Maxzide 75-50 Mg Tabs (Triamterene-hctz) .... One tab by mouth once daily 3)  Norvasc 10 Mg Tabs (Amlodipine besylate) .... Take 1 tablet by mouth once a day 4)  Ibuprofen 800 Mg Tabs (Ibuprofen) .... One tab by mouth tid 5)   Vicodin 5-500 Mg Tabs (Hydrocodone-acetaminophen) .... Take 1 tablet by mouth two times a day as needed, to last for 6 months  Other Orders: T-Lipid Profile ([id number]) T-CBC w/Diff ([id number]) T-TSH [phone])  Patient Instructions: 1)  CPE  in 6 weeks. 2)  BMP prior to visit, ICD-9: 3)  Lipid Panel prior to visit, ICD-9:  fasting as soon as possible 4)  TSH prior to visit, ICD-9: 5)  CBC w/ Diff prior to visit, ICD-9: 6)  It is important that you exercise regularly at least 60 minutes 6 times a week. If you develop chest pain, have severe difficulty breathing, or feel very tired , stop exercising immediately and seek medical attention. 7)  You need to lose weight. Consider a lower calorie diet and regular exercise.Congrats on your weight loss. 8)    Prescriptions: VICODIN 5-500 MG TABS (HYDROCODONE-ACETAMINOPHEN) Take 1 tablet by mouth two times a day as needed, to last for 6 months  #30 x 0   Entered and Authorized by:   Syliva Overman MD   Signed by:   Syliva Overman MD on 01/04/2009   Method used:   Print then Give to Patient   RxID:   [id number]

## 2010-11-12 NOTE — Progress Notes (Signed)
  Phone Note Call from Patient      

## 2010-11-12 NOTE — Progress Notes (Signed)
Summary: yeast infection  Phone Note From Other Clinic   Summary of Call: wants to know if Doc will call her in something for yeast inf. rx care [phone] Initial call taken by: Rudene Anda,  September 26, 2008 8:11 AM Summary of Call: advise and erx diflucan 150mg  #1 only, if symptoms persist needs oV Initial call taken by: Syliva Overman MD,  September 26, 2008 12:49 PM  Follow-up for Phone Call        Patient aware and medicaton sent to [facility] drug Follow-up by: Everitt Amber,  September 26, 2008 3:40 PM    New/Updated Medications: DIFLUCAN 150 MG TABS (FLUCONAZOLE) Take 1 tablet by mouth   Prescriptions: DIFLUCAN 150 MG TABS (FLUCONAZOLE) Take 1 tablet by mouth  #1 x 0   Entered by:   Everitt Amber   Authorized by:   Syliva Overman MD   Signed by:   Everitt Amber on 09/26/2008   Method used:   Electronically to        [id number] (retail)       [address]       [place], [place]  [address]       Ph: [phone] or [phone]       Fax: [phone]   RxID:   [id number]

## 2010-11-12 NOTE — Assessment & Plan Note (Signed)
Summary: OV   Vital Signs:  Patient profile:   40 year old female Menstrual status:  regular Height:      64 inches Weight:      254.50 pounds BMI:     43.84 O2 Sat:      97 % Pulse rate:   91 / minute Pulse rhythm:   regular Resp:     16 per minute BP sitting:   142 / 100  (left arm) Cuff size:   xl  Vitals Entered By: Everitt Amber LPN (January 08, 2010 8:24 AM)  Nutrition Counseling: Patient's BMI is greater than 25 and therefore counseled on weight management options. CC: Follow up chronic problems   CC:  Follow up chronic problems.  History of Present Illness: Reports  that she has been oing well.She is the mother of a 65 month old infant, and this is her first visit since delivery.  Denies recent fever or chills. Denies sinus pressure, nasal congestion , ear pain or sore throat. Denies chest congestion, or cough productive of sputum. Denies chest pain, palpitations, PND, orthopnea or leg swelling. Denies abdominal pain, nausea, vomitting, diarrhea or constipation. Denies change in bowel movements or bloody stool. Denies dysuria , frequency, incontinence or hesitancy. Denies  joint pain, swelling, or reduced mobility. Denies headaches, vertigo, seizures. Denies depression, anxiety or insomnia. Denies  rash, lesions, or itch.     Current Medications (verified): 1)  None  Allergies (verified): No Known Drug Allergies  Past History:  Past medical, surgical, family and social histories (including risk factors) reviewed, and no changes noted (except as noted below).  Past Medical History: Reviewed history from 01/20/2008 and no changes required. OBESITY (ICD-278.00) HYPERTENSION (ICD-401.9)  Past Surgical History:  Bilateral breast reduction (2004) Appendectomy Cholecystectomy Removal of left ovarian cyst in (2003) bTL 2011  Family History: Reviewed history from 01/20/2008 and no changes required.  MOTHER LIVING/  CAD / HTN /  FATHER DECEASED CORONARY  ARTERY DISEASE / END STAGE RENAL DISEASE / HYPERTENSION ONE SISTER  LIVING /  HTN  Social History: Reviewed history from 01/20/2008 and no changes required. EMPLOYED Single Never Smoked Alcohol use-no Drug use-no Three children  Review of Systems      See HPI Eyes:  Denies blurring and discharge. Endo:  Denies cold intolerance, excessive hunger, excessive thirst, excessive urination, heat intolerance, polyuria, and weight change. Heme:  Denies abnormal bruising and bleeding. Allergy:  Complains of seasonal allergies.  Physical Exam  General:  Well-developed,obese,in no acute distress; alert,appropriate and cooperative throughout examination HEENT: No facial asymmetry,  EOMI, No sinus tenderness, TM's Clear, oropharynx  pink and moist.   Chest: Clear to auscultation bilaterally.  CVS: S1, S2, No murmurs, No S3.   Abd: Soft, Nontender.  MS: Adequate ROM spine, hips, shoulders and knees.  Ext: No edema.   CNS: CN 2-12 intact, power tone and sensation normal throughout.   Skin: Intact, no visible lesions or rashes.  Psych: Good eye contact, normal affect.  Memory intact, not anxious or depressed appearing.    Impression & Recommendations:  Problem # 1:  OBESITY (ICD-278.00) Assessment Unchanged  Ht: 64 (01/08/2010)   Wt: 254.50 (01/08/2010)   BMI: 43.84 (01/08/2010)  Problem # 2:  HYPERTENSION (ICD-401.9) Assessment: Unchanged  The following medications were removed from the medication list:    Methyldopa 500 Mg Tabs (Methyldopa) .Marland Kitchen... 2 tablets twice daily Her updated medication list for this problem includes:    Maxzide 75-50 Mg Tabs (Triamterene-hctz) .Marland Kitchen... Take 1  tablet by mouth once a day    Tekturna 300 Mg Tabs (Aliskiren fumarate) .Marland Kitchen... Take 1 tablet by mouth once a day    Clonidine Hcl 0.3 Mg Tabs (Clonidine hcl) ..... One and a half tablets at bedtime  Orders: T-Basic Metabolic Panel [id number])  BP today: 142/100 Prior BP: 140/100 (04/17/2009)  Labs  Reviewed: K+: 4.0 (07/12/2007) Creat: : 0.88 (07/12/2007)   Chol: 186 (07/12/2007)   HDL: 68 (07/12/2007)   LDL: 94 (07/12/2007)   TG: 118 (07/12/2007)  Complete Medication List: 1)  Zyrtec Hives Relief 10 Mg Tabs (Cetirizine hcl) .... Take 1 tablet by mouth once a day 2)  Maxzide 75-50 Mg Tabs (Triamterene-hctz) .... Take 1 tablet by mouth once a day 3)  Tekturna 300 Mg Tabs (Aliskiren fumarate) .... Take 1 tablet by mouth once a day 4)  Clonidine Hcl 0.3 Mg Tabs (Clonidine hcl) .... One and a half tablets at bedtime  Other Orders: T-CBC w/Diff ([id number]) T-TSH [id number])  Patient Instructions: 1)  Please schedule a follow-up appointment in 3.5 months. 2)  BMP prior to visit, ICD-9:  fasting in 3.5 months 3)  TSH prior to visit, ICD-9: 4)  CBC w/ Diff prior to visit, ICD-9: 5)  It is important that you exercise regularly at least 60 minutes 5 times a week. If you develop chest pain, have severe difficulty breathing, or feel very tired , stop exercising immediately and seek medical attention. 6)  You need to lose weight. Consider a lower calorie diet and regular exercise.  Prescriptions: CLONIDINE HCL 0.3 MG TABS (CLONIDINE HCL) one and a half tablets at bedtime  #45 x 3   Entered and Authorized by:   Syliva Overman MD   Signed by:   Syliva Overman MD on 01/08/2010   Method used:   Electronically to        [facility]  [address] (retail)       [address]       [place], [place]  [address]       Ph: [phone]       Fax: [phone]   RxID:   [id number] TEKTURNA 300 MG TABS (ALISKIREN FUMARATE) Take 1 tablet by mouth once a day  #30 x 3   Entered and Authorized by:   Syliva Overman MD   Signed by:   Syliva Overman MD on 01/08/2010   Method used:   Electronically to        [facility] [address] [facility]ail[facility]       [address]       [place], [place]  [address]       Ph: [phone]       Fax: [phone]   RxID:    [id number] MAXZIDE 75-50 MG TABS (TRIAMTERENE-HCTZ) Take 1 tablet by mouth once a day  #30 x 3   Entered and Authorized by:   Syliva Overman MD   Signed by:   Syliva Overman MD on 01/08/2010   Method used:   Electronically to        [facility]  [address] (retail)       [address]       [place], [place]  [address]       Ph: [phone]       Fax: [phone]   RxID:   [id number] ZYRTEC HIVES RELIEF  10 MG TABS (CETIRIZINE HCL) Take 1 tablet by mouth once a day  #30 x 3   Entered and Authorized by:   Syliva Overman MD   Signed by:   Syliva Overman MD on 01/08/2010   Method used:   Electronically to        [facility]  [address] (retail)       [address]       [place], [place]  [address]       Ph: [phone]       Fax: [phone]   RxID:   [id number]

## 2010-11-12 NOTE — Assessment & Plan Note (Signed)
Summary: office visit   Vital Signs:  Patient profile:   40 year old female Menstrual status:  regular Height:      64 inches Weight:      255.56 pounds BMI:     44.03 Pulse rate:   92 / minute Pulse rhythm:   regular Resp:     16 per minute BP sitting:   140 / 100  (left arm)  Vitals Entered By: Worthy Keeler LPN (April 17, 864 1:15 PM)  Nutrition Counseling: Patient's BMI is greater than 25 and therefore counseled on weight management options. CC: follow-up visit, ammenorhea Is Patient Diabetic? No Pain Assessment Patient in pain? no        CC:  follow-up visit and ammenorhea.  History of Present Illness: lMP May 23, pt has been continually on contraception, she bled for the first severalmonths when pregnant with her last child, mom of 2 ages 70 and 27, had wanted BTL , but BP was an issue.currently asymptomatic.  now faced with a positive pregnancy test, though unplanned, she intends to keep the pregnancy. She denies any recent fever or chills, head or chest congestion, joint pain or stifness. She is currently slightly overwhelmed and tearful with her pos preg test result,but generally denies depression or anxiety.   Current Medications (verified): 1)  Clonidine Hcl 0.3 Mg  Tabs (Clonidine Hcl) .... One Tab By Mouth At 7am, One Half Tab By Mouth At 2pm, and One Tab By Mouth At 10pm 2)  Maxzide 75-50 Mg  Tabs (Triamterene-Hctz) .... One Tab By Mouth Once Daily 3)  Norvasc 10 Mg Tabs (Amlodipine Besylate) .... Take 1 Tablet By Mouth Once A Day 4)  Ibuprofen 800 Mg Tabs (Ibuprofen) .... One Tab By Mouth Tid  Allergies (verified): No Known Drug Allergies  Physical Exam  General:  alert, well-hydrated, and overweight-appearing. HEENT: No facial asymmetry,  EOMI, No sinus tenderness, TM's Clear, oropharynx  pink and moist.   Chest: Clear to auscultation bilaterally.  CVS: S1, S2, No murmurs, No S3.   Abd: Soft, Nontender.  MS: Adequate ROM spine, hips, shoulders and  knees.  Ext: No edema.   CNS: CN 2-12 intact, power tone and sensation normal throughout.   Skin: Intact, no visible lesions or rashes.  Psych: Good eye contact, normal affect.  Memory intact, anxious and tearful.   Impression & Recommendations:  Problem # 1:  PREGNANT STATE, INCIDENTAL (ICD-V22.2) Assessment Comment Only  Orders: Gynecologic Referral (Gyn)  Problem # 2:  OBESITY (ICD-278.00) Assessment: Unchanged  Ht: 64 (04/17/2009)   Wt: 255.56 (04/17/2009)   BMI: 44.03 (04/17/2009)  Problem # 3:  HYPERTENSION (ICD-401.9) Assessment: Comment Only  The following medications were removed from the medication list:    Clonidine Hcl 0.3 Mg Tabs (Clonidine hcl) ..... One tab by mouth at 7am, one half tab by mouth at 2pm, and one tab by mouth at 10pm    Maxzide 75-50 Mg Tabs (Triamterene-hctz) ..... One tab by mouth once daily    Norvasc 10 Mg Tabs (Amlodipine besylate) .Marland Kitchen... Take 1 tablet by mouth once a day    Methyldopa 500 Mg Tabs (Methyldopa) .Marland Kitchen... 2 tablets twice daily Her updated medication list for this problem includes:    Methyldopa 500 Mg Tabs (Methyldopa) .Marland Kitchen... 2 tablets twice daily  Orders: T-Basic Metabolic Panel [id number])  BP today: 140/100, pt will have her meds to those deemed safe in pregnancy, she needs to establish with gynae asap, a high priority is being put on  ths referral Prior BP: 150/110 (01/04/2009)  Labs Reviewed: K+: 4.0 (07/12/2007) Creat: : 0.88 (07/12/2007)   Chol: 186 (07/12/2007)   HDL: 68 (07/12/2007)   LDL: 94 (07/12/2007)   TG: 118 (07/12/2007)  Problem # 4:  BACK PAIN WITH RADICULOPATHY (ICD-729.2) Assessment: Improved  Complete Medication List: 1)  Methyldopa 500 Mg Tabs (Methyldopa) .... 2 tablets twice daily 2)  Prenatal Ca Carb-b6-b12-fa 1 Mg Tabs (Prenatal ca carb-b6-b12-fa) .... Take 1 tablet by mouth once a day 3)  Folic Acid 1 Mg Tabs (Folic acid) .... Take 1 tablet by mouth once a day  Other Orders: Urine Pregnancy Test   ([id number]) T-Lipid Profile [phone] [phone]) T-CBC w/Diff [id number]) T-TSH [id number]) T-Syphilis Test (RPR) ([id number]) T-HIV Antibody  (Reflex) ([id number]) T-Pregnancy (Serum), Quant. [phone])  Patient Instructions: 1)  you will be referre4d  to Dr. Cherly Hensen as soon as possible. 2)  Nurse bP check in 2 weeks, if you have not established with [facility] as yet. 3)  Labwork today 4)  BMP prior to visit, ICD-9: 5)  Lipid Panel prior to visit, ICD-9: 6)  TSH prior to visit, ICD-9: 7)  CBC w/ Diff prior to visit, ICD-9: 8)  hIv and RPR 9)  It is important that you exercise regularly at least 40 minutes5 times a week. If you develop chest pain, have severe difficulty breathing, or feel very tired , stop exercising immediately and seek medical attention. 10)  pls stop all your olsd bP meds, anew one has been sent in 11)  You need to lose weight. Consider a lower calorie diet and regular exercise.  Prescriptions: FOLIC ACID 1 MG TABS (FOLIC ACID) Take 1 tablet by mouth once a day  #90 x 3   Entered and Authorized by:   Syliva Overman MD   Signed by:   Syliva Overman MD on 04/17/2009   Method used:   Electronically to        [facility] [address] [facility]       [address]       [place], [place]  [address]       Ph: [phone]       Fax: [phone]   RxID:   [id number] PRENATAL CA CARB-B6-B12-FA 1 MG TABS (PRENATAL CA CARB-B6-B12-FA) Take 1 tablet by mouth once a day  #90 x 2   Entered and Authorized by:   Syliva Overman MD   Signed by:   Syliva Overman MD on 04/17/2009   Method used:   Electronically to        [facility]  [address] (retail)       [address]       [place], [place]  [address]       Ph: [phone]       Fax: [phone]   RxID:   [id number] METHYLDOPA 500 MG TABS (METHYLDOPA) 2 tablets twice daily  #120 x 3   Entered and Authorized by:   Syliva Overman MD   Signed by:   Syliva Overman MD on  04/17/2009   Method used:   [facility]ronically to        [facility] [address] [facility]       [address]       [place], [place]  [address]       Ph: [phone]  Fax: [phone]   RxID:   [id number] METHYLDOPA 500 MG TABS (METHYLDOPA) 2 tablets twice daily  #120 x 3   Entered and Authorized by:   Syliva Overman MD   Signed by:   Syliva Overman MD on 04/17/2009   Method used:   [facility]ically to        [facility] [address] [facility]       [address]       [place], [place]  [address]       Ph: [phone]       Fax: [phone]   RxID:   [id number]   Laboratory Results   Urine Tests  Date/Time Received: 04/17/09 1:35p Date/Time Reported: 04/17/09 1:35p    Urine HCG: positive

## 2010-11-12 NOTE — Letter (Signed)
Summary: first letter  first letter   Imported By: Lind Guest 04/24/2010 14:47:54  _____________________________________________________________________  External Attachment:    Type:   Image     Comment:   External Document

## 2010-11-12 NOTE — Progress Notes (Signed)
Summary: BP PILLS  Phone Note Call from Patient   Summary of Call: [facility] HAS FAXED OVER RX FOR HER BP PILLS AND THEY HAVE NOT RECEIVED A RESPONSE PLEASE FAX IT OVER SHE HAS BEEN WITHOUT A WEEK CALL HER TOLET HER KNOW AT [phone] Initial call taken by: Lind Guest,  Mar 09, 2009 11:12 AM  Follow-up for Phone Call        returned call, left message Follow-up by: Worthy Keeler LPN,  Mar 09, 2009 2:02 PM  Additional Follow-up for Phone Call Additional follow up Details #1::        returned call, left message Additional Follow-up by: Worthy Keeler LPN,  March 14, 4539 8:43 AM

## 2010-11-12 NOTE — Letter (Signed)
Summary: Out of Work  [facility]  [address]   [place], [place] [address]   Phone: [phone]  Fax: [phone]    July 11, 2008   Employee:  SARAPHINA LAUDERBAUGH    To Whom It May Concern:   For Medical reasons, please excuse the above named employee from work for the following dates:  Start:   07/11/08  End:   07/17/08  If you need additional information, please feel free to contact our office.         Sincerely,    Worthy Keeler LPN

## 2010-11-12 NOTE — Progress Notes (Signed)
  Phone Note From Pharmacy   Caller: [facility]  [address] [facility] Summary of Call: tekturna requires pa Initial call taken by: Adella Hare LPN,  January 08, 2010 11:41 AM  Follow-up for Phone Call        SEND FOR THE ALTERNATIVES ON HER INS PLS, Y UNDERSATNDING WAS THAT SHE HAS BEEN ON THIS UP TO RECENTLY Follow-up by: Syliva Overman MD,  January 08, 2010 9:27 PM  Additional Follow-up for Phone Call Additional follow up Details #1::        sent for pa form Additional Follow-up by: Adella Hare LPN,  January 17, 2010 3:32 PM

## 2010-11-12 NOTE — Miscellaneous (Signed)
Summary: refill  Clinical Lists Changes  Medications: Rx of CLONIDINE HCL 0.3 MG  TABS (CLONIDINE HCL) one tab by mouth at 7am, one half tab by mouth at 2pm, and one tab by mouth at 10pm;  #75 x 1;  Signed;  Entered by: Worthy Keeler LPN;  Authorized by: Syliva Overman MD;  Method used: Electronic Rx of TEKTURNA 300 MG  TABS (ALISKIREN FUMARATE) one tab by mouth once daily;  #30 x 1;  Signed;  Entered by: Worthy Keeler LPN;  Authorized by: Syliva Overman MD;  Method used: Electronic    Prescriptions: TEKTURNA 300 MG  TABS (ALISKIREN FUMARATE) one tab by mouth once daily  #30 x 1   Entered by:   Worthy Keeler LPN   Authorized by:   Syliva Overman MD   Signed by:   Worthy Keeler LPN on 16/07/9603   Method used:   Electronically sent to ...       [facility]       [address]       [place], [place]  [address]       Ph: [phone] or [phone]       Fax: [phone]   RxID:   [id number] CLONIDINE HCL 0.3 MG  TABS (CLONIDINE HCL) one tab by mouth at 7am, one half tab by mouth at 2pm, and one tab by mouth at 10pm  #75 x 1   Entered by:   Worthy Keeler LPN   Authorized by:   Syliva Overman MD   Signed by:   Worthy Keeler LPN on 40/34/7425   Method used:   Electronically sent to ...       [facility]       [address]       [place], [place]  [address]       Ph: [phone] or [phone]       Fax: [phone]   RxID:   [id number]

## 2010-11-12 NOTE — Progress Notes (Signed)
Summary: STATEMENTS  Phone Note Call from Patient   Summary of Call: NEEDS ALETTER STATING  SHE IS PREGNANT ( for medicaid) NEEDS REFERRALL TO OBGYN  NEEDS A NOTE FOR HER FIN. THAT SHE CAN HAVE SEX CALL BACK [phone] Initial call taken by: Lind Guest,  May 01, 2009 11:11 AM  Follow-up for Phone Call        pls addres this i thoughtthat an appt with Dr. Cherly Hensen had already been confirmed, pls let me know alsothe pt, she has eXTREMELY high blood pressure Follow-up by: Syliva Overman MD,  May 01, 2009 12:45 PM  Additional Follow-up for Phone Call Additional follow up Details #1::        nursing pls advse the pt that sexual activity is fine if she is not bleeding or spotting or has not hy miscarriages and her pregnancy is healthy, she needs to see ob to assess all of these things Additional Follow-up by: Syliva Overman MD,  May 01, 2009 12:49 PM    Additional Follow-up for Phone Call Additional follow up Details #2::    letters available Follow-up by: Worthy Keeler LPN,  May 03, 2009 10:32 AM

## 2010-11-12 NOTE — Assessment & Plan Note (Signed)
Summary: OV   Vital Signs:  Patient Profile:   40 Years Old Female Height:     64 inches Weight:      260.38 pounds BMI:     44.86 O2 Sat:      98 % Temp:     97.8 degrees F oral Pulse rate:   96 / minute Pulse rhythm:   regular Resp:     16 per minute BP sitting:   140 / 90  (left arm)  Pt. in pain?   yes    Location:   body aches    Intensity:   9    Type:       aching  Vitals Entered ByEveritt Amber (July 11, 2008 10:26 AM)                  Chief Complaint:  2 day h/o body aches and chills also swollen foot.  History of Present Illness: Two day h/o generaliosed body aches, temp to 102 post  nasal drainage, sore throat, watery nasal drainage, cough yellow sputum. Positive flu contact. Pt has stopped phentermine, doingiton her own. Watery red eyes. Swelling of dorsum of left foot with pain and numbness  down back to the foot for 3 days Pt out of tekturna for 3 days cost is $60, will change to norvasc10mg .      Updated Prior Medication List: CLONIDINE HCL 0.3 MG  TABS (CLONIDINE HCL) one tab by mouth at 7am, one half tab by mouth at 2pm, and one tab by mouth at 10pm MAXZIDE 75-50 MG  TABS (TRIAMTERENE-HCTZ) one tab by mouth once daily TEKTURNA 300 MG  TABS (ALISKIREN FUMARATE) one tab by mouth once daily  Current Allergies: No known allergies      Review of Systems  ENT      Denies hoarseness, sinus pressure, and sore throat.  CV      Complains of swelling of feet.      Denies chest pain or discomfort, palpitations, and shortness of breath with exertion.  Resp      Complains of cough and shortness of breath.      Denies sputum productive and wheezing.  GI      Denies abdominal pain, constipation, diarrhea, nausea, and vomiting.  GU      Denies dysuria and urinary frequency.  MS      Denies joint pain and stiffness.   Physical Exam  General:     overweight-appearing.   Head:     Normocephalic and atraumatic without obvious  abnormalities. No apparent alopecia or balding. Eyes:     vision grossly intact.   Ears:     R ear normal and L ear normal.   Nose:     no external erythema and no nasal discharge.   Mouth:     Oral mucosa and oropharynx without lesions or exudates.  Teeth in good repair. Neck:     No deformities, masses, or tenderness noted. Chest Wall:     tender to palpation over post chest wall Lungs:     Normal respiratory effort, chest expands symmetrically. Lungs are clear to auscultation, no crackles or wheezes. Heart:     Normal rate and regular rhythm. S1 and S2 normal without gallop, murmur, click, rub or other extra sounds. Abdomen:     soft and non-tender.   Extremities:     No clubbing, cyanosis, edema, or deformity noted with normal full range of motion of all joints.   Swelling of  dorsum of left foot. Neurologic:     No cranial nerve deficits noted. Station and gait are normal. Plantar reflexes are down-going bilaterally. DTRs are symmetrical throughout. Sensory, motor and coordinative functions appear intact. Skin:     Intact without suspicious lesions or rashes Cervical Nodes:     No lymphadenopathy noted Psych:     Cognition and judgment appear intact. Alert and cooperative with normal attention span and concentration. No apparent delusions, illusions, hallucinations    Impression & Recommendations:  Problem # 1:  INFLUENZA WITH OTHER RESPIRATORY MANIFESTATIONS (ICD-487.1) Assessment: Comment Only  Her updated medication list for this problem includes:    Tamiflu 75 Mg Caps (Oseltamivir phosphate) .Marland Kitchen... Take 1 capsule by mouth two times a day  Orders: T-CBC w/Diff ([phone]) T- * Misc. Laboratory test [phone]) Ketorolac-Toradol 15mg  [phone]) Admin of Therapeutic Inj  intramuscular or subcutaneous (29562) Depo- Medrol 80mg  (J1040)   Problem # 2:  OBESITY (ICD-278.00) Assessment: Unchanged  Problem # 3:  HYPERTENSION (ICD-401.9) Assessment: Deteriorated  The  following medications were removed from the medication list:    Tekturna 300 Mg Tabs (Aliskiren fumarate) ..... One tab by mouth once daily  Her updated medication list for this problem includes:    Clonidine Hcl 0.3 Mg Tabs (Clonidine hcl) ..... One tab by mouth at 7am, one half tab by mouth at 2pm, and one tab by mouth at 10pm    Maxzide 75-50 Mg Tabs (Triamterene-hctz) ..... One tab by mouth once daily    Norvasc 10 Mg Tabs (Amlodipine besylate) .Marland Kitchen... Take 1 tablet by mouth once a day   Problem # 4:  BACK PAIN WITH RADICULOPATHY (ICD-729.2) Assessment: Comment Only Toradol and depomedrol administerd.  Complete Medication List: 1)  Clonidine Hcl 0.3 Mg Tabs (Clonidine hcl) .... One tab by mouth at 7am, one half tab by mouth at 2pm, and one tab by mouth at 10pm 2)  Maxzide 75-50 Mg Tabs (Triamterene-hctz) .... One tab by mouth once daily 3)  Tamiflu 75 Mg Caps (Oseltamivir phosphate) .... Take 1 capsule by mouth two times a day 4)  Tessalon Perles 100 Mg Caps (Benzonatate) .... Take 1 capsule by mouth three times a day 5)  Norvasc 10 Mg Tabs (Amlodipine besylate) .... Take 1 tablet by mouth once a day   Patient Instructions: 1)  Follow up in 6 weeks. 2)  You are being treated for influenza, practice good hygiene to avoid spread. 3)  work excuse return Ovt. 5,2009. 4)  Labs today. 5)  You will get a shot of toradol today, pls fill Rx fo tamiflu and decongvestant. 6)  Labs today.CBC and diff, influenza A antibody. 7)  You will start noevasc in place  of tekturna.   Prescriptions: NORVASC 10 MG TABS (AMLODIPINE BESYLATE) Take 1 tablet by mouth once a day  #30 x 1   Entered and Authorized by:   Syliva Overman MD   Signed by:   Syliva Overman MD on 07/16/2008   Method used:   Electronically to        [facility] [address] (retail)       [address]       [place], [place]  [address]       Ph: [phone] or [phone]       Fax: [phone]    RxID:   [id number] TESSALON PERLES 100 MG CAPS (BENZONATATE) Take 1 capsule by mouth three times a day  #30 x  0   Entered and Authorized by:   Syliva Overman MD   Signed by:   Syliva Overman MD on 07/16/2008   Method used:   Electronically to        [facility] [address] (retail)       [address]       [place], [place]  [address]       Ph: [phone] or [phone]       Fax: [phone]   RxID:   [id number] TAMIFLU 75 MG CAPS (OSELTAMIVIR PHOSPHATE) Take 1 capsule by mouth two times a day  #10 x 0   Entered and Authorized by:   Syliva Overman MD   Signed by:   Syliva Overman MD on 07/16/2008   Method used:   Electronically to        [facility] (retail)       [address]       [place], [place]  [address]       Ph: [phone] or [phone]       Fax: [phone]   RxID:   [id number]  ]  Medication Administration  Injection # 1:    Medication: Depo- Medrol 80mg     Diagnosis: INFLUENZA WITH OTHER RESPIRATORY MANIFESTATIONS (ICD-487.1)    Route: IM    Site: RUOQ gluteus    Exp Date: 8/10    Lot #: [id number]    Mfr: SICOR    Patient tolerated injection without complications    Given by: Worthy Keeler LPN (July 11, 2008 11:28 AM)  Injection # 2:    Medication: Ketorolac-Toradol 15mg     Diagnosis: INFLUENZA WITH OTHER RESPIRATORY MANIFESTATIONS (ICD-487.1)    Route: IM    Site: LUOQ gluteus    Exp Date: 3/11    Lot #: [id number]    Mfr: NOVAPLUS    Comments: TORADOL 60MG  GIVEN    Patient tolerated injection without complications    Given by: Worthy Keeler LPN (July 11, 2008 11:29 AM)  Orders Added: 1)  Est. Patient Level IV [76283] 2)  T-CBC w/Diff [[id number]] 3)  T- * Misc. Laboratory test [99999] 4)  Ketorolac-Toradol 15mg  [J1885] 5)  Admin of Therapeutic Inj  intramuscular or subcutaneous [[address]] 6)  Depo- Medrol 80mg  [J1040]

## 2010-11-12 NOTE — Progress Notes (Signed)
Summary: CRAMPS  Phone Note Call from Patient   Summary of Call: HAVING BAD CRAMPS AND NEEDS [facility] [phone] Initial call taken by: Lind Guest,  January 02, 2009 1:51 PM  Follow-up for Phone Call        advise and erx ibuprofen 800mg  1 three times daily #30 refill zero Follow-up by: Syliva Overman MD,  January 02, 2009 5:20 PM  Additional Follow-up for Phone Call Additional follow up Details #1::        rx sent, left patient message Additional Follow-up by: Worthy Keeler LPN,  January 03, 2009 2:20 PM    New/Updated Medications: IBUPROFEN 800 MG TABS (IBUPROFEN) one tab by mouth tid   Prescriptions: IBUPROFEN 800 MG TABS (IBUPROFEN) one tab by mouth tid  #30 x 0   Entered by:   Worthy Keeler LPN   Authorized by:   Syliva Overman MD   Signed by:   Worthy Keeler LPN on 45/40/9811   Method used:   Electronically to        [facility]  [address] (retail)       [address]       [place], [place]  [address]       Ph: [phone]       Fax: [phone]   RxID:   [id number] IBUPROFEN 800 MG TABS (IBUPROFEN) one tab by mouth tid  #30 x 0   Entered by:   Worthy Keeler LPN   Authorized by:   Syliva Overman MD   Signed by:   Worthy Keeler LPN on 66/44/0347   Method used:   Electronically to        [facility] (retail)       [address]       [place], [place]  [address]       Ph: [phone] or [phone]       Fax: [phone]   RxID:   [id number]  [facility]

## 2010-11-14 NOTE — Assessment & Plan Note (Signed)
Summary: blood pressure/slj   Vital Signs:  Patient profile:   40 year old female Menstrual status:  regular Height:      64 inches Weight:      267.25 pounds BMI:     46.04 O2 Sat:      98 % Pulse rate:   96 / minute Pulse rhythm:   regular Resp:     16 per minute BP sitting:   170 / 120  (left arm) Cuff size:   xl  Vitals Entered By: Everitt Amber LPN (October 25, 2010 10:32 AM)  Nutrition Counseling: Patient's BMI is greater than 25 and therefore counseled on weight management options. CC: Follow up chronic problems, BP has been extremely elevated, had to go to er sat night   CC:  Follow up chronic problems, BP has been extremely elevated, and had to go to er sat night.  History of Present Illness: Pt has been to the [facility] recently dueto eadache, her BP was found to be markedly elevated both times, she took heself off of one of her meds, tekturna for economic reasons unknown to me. She has recently ben layed off, and has gained alot of weight.  Current Medications (verified): 1)  Maxzide 75-50 Mg Tabs (Triamterene-Hctz) .... Take 1 Tablet By Mouth Once A Day 2)  Clonidine Hcl 0.3 Mg Tabs (Clonidine Hcl) .... One and A Half Tablets At Bedtime  Allergies (verified): No Known Drug Allergies  Past History:  Social history (including risk factors) reviewed for relevance to current acute and chronic problems.  Social History: Reviewed history from 01/08/2010 and no changes required. UNEMPLOYED, layed off from health ins industry Single Never Smoked Alcohol use-no Drug use-no Three children  Review of Systems      See HPI General:  Denies chills and fatigue. ENT:  Denies hoarseness, nasal congestion, postnasal drainage, and sinus pressure. CV:  Denies chest pain or discomfort, lightheadness, palpitations, and swelling of feet. Resp:  Denies coughing up blood and sputum productive. GI:  Denies abdominal pain, constipation, diarrhea, nausea, and vomiting. Neuro:   Complains of headaches; denies falling down, poor balance, seizures, sensation of room spinning, and tingling.  Physical Exam  General:  Well-developedobese,in no acute distress; alert,appropriate and cooperative throughout examination HEENT: No facial asymmetry,  EOMI, No sinus tenderness, T oropharynx  pink and moist.   Chest: Clear to auscultation bilaterally.  CVS: S1, S2, No murmurs, No S3.   Abd: Soft, Nontender.  MS: Adequate ROM spine, hips, shoulders and knees.  Ext: No edema.   CNS: CN 2-12 intact, power tone and sensation normal throughout.   Skin: Intact, no visible lesions or rashes.  Psych: Good eye contact, normal affect.  Memory intact, not anxious or depressed appearing.    Impression & Recommendations:  Problem # 1:  OBESITY (ICD-278.00) Assessment Deteriorated  Ht: 64 (10/25/2010)   Wt: 267.25 (10/25/2010)   BMI: 46.04 (10/25/2010) therapeutic lifestyle change discussed and encouraged  Problem # 2:  HYPERTENSION (ICD-401.9) Assessment: Deteriorated  The following medications were removed from the medication list:    Maxzide 75-50 Mg Tabs (Triamterene-hctz) .Marland Kitchen... Take 1 tablet by mouth once a day    Tekturna 300 Mg Tabs (Aliskiren fumarate) .Marland Kitchen... Take 1 tablet by mouth once a day    Clonidine Hcl 0.3 Mg Tabs (Clonidine hcl) ..... One and a half tablets at bedtime Her updated medication list for this problem includes:    Amlodipine Besylate 10 Mg Tabs (Amlodipine besylate) .Marland Kitchen... Take 1 tablet by  mouth once a day take at 8am along with the maxzide    Clonidine Hcl 0.3 Mg Tabs (Clonidine hcl) .Marland Kitchen... Take 2 tablets at bedtime 12 midnight, this is adose increase effective 10/25/2010    Maxzide 75-50 Mg Tabs (Triamterene-hctz) .Marland Kitchen... Take 1 tablet by mouth once a day Increasedrisk of vascular ds, kidney ds and stroke discussed BP today: 170/120 Prior BP: 142/100 (01/08/2010)  Labs Reviewed: K+: 4.0 (07/12/2007) Creat: : 0.88 (07/12/2007)   Chol: 186 (07/12/2007)    HDL: 68 (07/12/2007)   LDL: 94 (07/12/2007)   TG: 118 (07/12/2007)  Complete Medication List: 1)  Amlodipine Besylate 10 Mg Tabs (Amlodipine besylate) .... Take 1 tablet by mouth once a day take at 8am along with the maxzide 2)  Clonidine Hcl 0.3 Mg Tabs (Clonidine hcl) .... Take 2 tablets at bedtime 12 midnight, this is adose increase effective 10/25/2010 3)  Maxzide 75-50 Mg Tabs (Triamterene-hctz) .... Take 1 tablet by mouth once a day  Patient Instructions: 1)  f/u in 5 weeks exactly  2)  pLS take all meds as prescribed. 3)  Call with questions or adverse side effects. 4)  CONGRATS on no sodas this week, you do nOT need to start drinking them they are a bIG part of your high blood pressure problem Prescriptions: MAXZIDE 75-50 MG TABS (TRIAMTERENE-HCTZ) Take 1 tablet by mouth once a day  #30 x 1   Entered and Authorized by:   Syliva Overman MD   Signed by:   Syliva Overman MD on 10/25/2010   Method used:   Electronically to        [facility] [address] [facility] (retail)       [address]       [place], [place]  [address]       Ph: [phone]       Fax: [phone]   RxID:   [id number] CLONIDINE HCL 0.3 MG TABS (CLONIDINE HCL) take 2 tablets at bedtime 12 midnight, this is adose increase effective 10/25/2010  #60 x 1   Entered and Authorized by:   Syliva Overman MD   Signed by:   Syliva Overman MD on 10/25/2010   Method used:   Printed then faxed to ...       [facility] [address] [facility] ([facility]ail)       [address]       [place], [place]  [address]       Ph: [phone]       Fax: [phone]   RxID:   [id number] AMLODIPINE BESYLATE 10 MG TABS (AMLODIPINE BESYLATE) Take 1 tablet by mouth once a day take at 8am along with the maxzide  #30 x 1   Entered and Authorized by:   Syliva Overman MD   Signed by:   Syliva Overman MD on 10/25/2010   Method used:   Electronically to        [facility]  [address] (retail)       [address]        [place], [place]  [address]       Ph: [phone]       Fax: [phone]   RxID:   [id number]    Orders Added: 1)  Est. Patient Level III [517[address]]

## 2010-11-14 NOTE — Letter (Signed)
Summary: dose increase  dose increase   Imported By: Lind Guest 10/28/2010 15:26:00  _____________________________________________________________________  External Attachment:    Type:   Image     Comment:   External Document

## 2010-11-29 ENCOUNTER — Ambulatory Visit: Payer: Self-pay | Admitting: Family Medicine

## 2010-11-29 ENCOUNTER — Encounter: Payer: Self-pay | Admitting: Family Medicine

## 2010-12-10 NOTE — Letter (Signed)
Summary: 1st no show  1st no show   Imported By: Lind Guest 12/03/2010 16:41:11  _____________________________________________________________________  External Attachment:    Type:   Image     Comment:   External Document

## 2010-12-23 LAB — BASIC METABOLIC PANEL WITH GFR
BUN: 6 mg/dL (ref 6–23)
CO2: 26 meq/L (ref 19–32)
Calcium: 9.5 mg/dL (ref 8.4–10.5)
Chloride: 100 meq/L (ref 96–112)
Creatinine, Ser: 0.78 mg/dL (ref 0.4–1.2)
GFR calc Af Amer: >60 mL/min (ref >60)
GFR calc non Af Amer: >60 mL/min (ref >60)
Glucose, Bld: 104 mg/dL — ABNORMAL HIGH (ref 70–99)
Potassium: 3.2 meq/L — ABNORMAL LOW (ref 3.5–5.1)
Sodium: 137 meq/L (ref 135–145)

## 2010-12-23 LAB — CBC
HCT: 40.5 % (ref 36.0–46.0)
Hemoglobin: 14 g/dL (ref 12.0–15.0)
MCH: 29.8 pg (ref 26.0–34.0)
MCHC: 34.6 g/dL (ref 30.0–36.0)
MCV: 86.2 fL (ref 78.0–100.0)
Platelets: 282 10*3/uL (ref 150–400)
RBC: 4.7 MIL/uL (ref 3.87–5.11)
RDW: 13.3 % (ref 11.5–15.5)
WBC: 11.1 10*3/uL — ABNORMAL HIGH (ref 4.0–10.5)

## 2010-12-23 LAB — DIFFERENTIAL
Basophils Absolute: 0 10*3/uL (ref 0.0–0.1)
Basophils Relative: 0 % (ref 0–1)
Eosinophils Absolute: 0 10*3/uL (ref 0.0–0.7)
Eosinophils Relative: 0 % (ref 0–5)
Lymphocytes Relative: 15 % (ref 12–46)
Lymphs Abs: 1.7 10*3/uL (ref 0.7–4.0)
Monocytes Absolute: 0.4 10*3/uL (ref 0.1–1.0)
Monocytes Relative: 4 % (ref 3–12)
Neutro Abs: 9 10*3/uL — ABNORMAL HIGH (ref 1.7–7.7)
Neutrophils Relative %: 81 % — ABNORMAL HIGH (ref 43–77)

## 2010-12-23 LAB — URINALYSIS, ROUTINE W REFLEX MICROSCOPIC
Bilirubin Urine: NEGATIVE
Glucose, UA: NEGATIVE mg/dL
Ketones, ur: NEGATIVE mg/dL
Leukocytes, UA: NEGATIVE
Nitrite: NEGATIVE
Protein, ur: NEGATIVE mg/dL
Specific Gravity, Urine: 1.01 (ref 1.005–1.030)
Urobilinogen, UA: 0.2 mg/dL (ref 0.0–1.0)
pH: 7 (ref 5.0–8.0)

## 2010-12-23 LAB — POCT CARDIAC MARKERS
CKMB, poc: 1.9 ng/mL (ref 1.0–8.0)
Myoglobin, poc: 85.5 ng/mL (ref 12–200)
Troponin i, poc: <0.05 ng/mL (ref 0.00–0.09)

## 2010-12-23 LAB — URINE MICROSCOPIC-ADD ON

## 2010-12-23 LAB — PREGNANCY, URINE: Preg Test, Ur: NEGATIVE

## 2010-12-29 LAB — COMPREHENSIVE METABOLIC PANEL
ALT: 20 U/L (ref 0–35)
ALT: 22 U/L (ref 0–35)
AST: 19 U/L (ref 0–37)
AST: 22 U/L (ref 0–37)
Albumin: 2.6 g/dL — ABNORMAL LOW (ref 3.5–5.2)
Albumin: 2.7 g/dL — ABNORMAL LOW (ref 3.5–5.2)
Alkaline Phosphatase: 105 U/L (ref 39–117)
Alkaline Phosphatase: 115 U/L (ref 39–117)
BUN: 10 mg/dL (ref 6–23)
BUN: 9 mg/dL (ref 6–23)
CO2: 20 mEq/L (ref 19–32)
CO2: 23 mEq/L (ref 19–32)
Calcium: 8.9 mg/dL (ref 8.4–10.5)
Calcium: 9 mg/dL (ref 8.4–10.5)
Chloride: 105 mEq/L (ref 96–112)
Chloride: 108 mEq/L (ref 96–112)
Creatinine, Ser: 0.77 mg/dL (ref 0.4–1.2)
Creatinine, Ser: 0.85 mg/dL (ref 0.4–1.2)
GFR calc Af Amer: >60 mL/min (ref >60)
GFR calc Af Amer: >60 mL/min (ref >60)
GFR calc non Af Amer: >60 mL/min (ref >60)
GFR calc non Af Amer: >60 mL/min (ref >60)
Glucose, Bld: 84 mg/dL (ref 70–99)
Glucose, Bld: 88 mg/dL (ref 70–99)
Potassium: 3.8 mEq/L (ref 3.5–5.1)
Potassium: 4.2 mEq/L (ref 3.5–5.1)
Sodium: 134 mEq/L — ABNORMAL LOW (ref 135–145)
Sodium: 135 mEq/L (ref 135–145)
Total Bilirubin: 0.6 mg/dL (ref 0.3–1.2)
Total Bilirubin: 0.8 mg/dL (ref 0.3–1.2)
Total Protein: 5.8 g/dL — ABNORMAL LOW (ref 6.0–8.3)
Total Protein: 6 g/dL (ref 6.0–8.3)

## 2010-12-29 LAB — URINALYSIS, ROUTINE W REFLEX MICROSCOPIC
Bilirubin Urine: NEGATIVE
Bilirubin Urine: NEGATIVE
Glucose, UA: NEGATIVE mg/dL
Glucose, UA: NEGATIVE mg/dL
Hgb urine dipstick: NEGATIVE
Hgb urine dipstick: NEGATIVE
Ketones, ur: NEGATIVE mg/dL
Ketones, ur: NEGATIVE mg/dL
Nitrite: NEGATIVE
Nitrite: NEGATIVE
Protein, ur: NEGATIVE mg/dL
Protein, ur: NEGATIVE mg/dL
Specific Gravity, Urine: 1.025 (ref 1.005–1.030)
Specific Gravity, Urine: >1.03 — ABNORMAL HIGH (ref 1.005–1.030)
Urobilinogen, UA: 1 mg/dL (ref 0.0–1.0)
Urobilinogen, UA: 4 mg/dL — ABNORMAL HIGH (ref 0.0–1.0)
pH: 6 (ref 5.0–8.0)
pH: 6 (ref 5.0–8.0)

## 2010-12-29 LAB — LACTATE DEHYDROGENASE
LDH: 147 U/L (ref 94–250)
LDH: 202 U/L (ref 94–250)

## 2010-12-29 LAB — CBC
HCT: 33.8 % — ABNORMAL LOW (ref 36.0–46.0)
HCT: 38 % (ref 36.0–46.0)
HCT: 40.7 % (ref 36.0–46.0)
Hemoglobin: 11.4 g/dL — ABNORMAL LOW (ref 12.0–15.0)
Hemoglobin: 12.8 g/dL (ref 12.0–15.0)
Hemoglobin: 13.6 g/dL (ref 12.0–15.0)
MCHC: 33.3 g/dL (ref 30.0–36.0)
MCHC: 33.7 g/dL (ref 30.0–36.0)
MCHC: 33.7 g/dL (ref 30.0–36.0)
MCV: 94.9 fL (ref 78.0–100.0)
MCV: 95.7 fL (ref 78.0–100.0)
MCV: 96.3 fL (ref 78.0–100.0)
Platelets: 201 10*3/uL (ref 150–400)
Platelets: 236 10*3/uL (ref 150–400)
Platelets: 246 10*3/uL (ref 150–400)
RBC: 3.51 MIL/uL — ABNORMAL LOW (ref 3.87–5.11)
RBC: 4 MIL/uL (ref 3.87–5.11)
RBC: 4.25 MIL/uL (ref 3.87–5.11)
RDW: 13.5 % (ref 11.5–15.5)
RDW: 13.9 % (ref 11.5–15.5)
RDW: 14.1 % (ref 11.5–15.5)
WBC: 10.4 10*3/uL (ref 4.0–10.5)
WBC: 11.8 10*3/uL — ABNORMAL HIGH (ref 4.0–10.5)
WBC: 12.5 10*3/uL — ABNORMAL HIGH (ref 4.0–10.5)

## 2010-12-29 LAB — DIFFERENTIAL
Basophils Absolute: 0 10*3/uL (ref 0.0–0.1)
Basophils Relative: 0 % (ref 0–1)
Eosinophils Absolute: 0 10*3/uL (ref 0.0–0.7)
Eosinophils Relative: 0 % (ref 0–5)
Lymphocytes Relative: 19 % (ref 12–46)
Lymphs Abs: 1.9 10*3/uL (ref 0.7–4.0)
Monocytes Absolute: 0.5 10*3/uL (ref 0.1–1.0)
Monocytes Relative: 5 % (ref 3–12)
Neutro Abs: 7.9 10*3/uL — ABNORMAL HIGH (ref 1.7–7.7)
Neutrophils Relative %: 76 % (ref 43–77)

## 2010-12-29 LAB — RPR: RPR Ser Ql: NONREACTIVE

## 2010-12-29 LAB — URIC ACID
Uric Acid, Serum: 5.5 mg/dL (ref 2.4–7.0)
Uric Acid, Serum: 5.6 mg/dL (ref 2.4–7.0)

## 2010-12-29 LAB — URINE MICROSCOPIC-ADD ON

## 2010-12-30 LAB — COMPREHENSIVE METABOLIC PANEL
ALT: 15 U/L (ref 0–35)
AST: 17 U/L (ref 0–37)
Albumin: 2.9 g/dL — ABNORMAL LOW (ref 3.5–5.2)
Alkaline Phosphatase: 94 U/L (ref 39–117)
BUN: 15 mg/dL (ref 6–23)
CO2: 29 mEq/L (ref 19–32)
Calcium: 8.7 mg/dL (ref 8.4–10.5)
Chloride: 101 mEq/L (ref 96–112)
Creatinine, Ser: 1.07 mg/dL (ref 0.4–1.2)
GFR calc Af Amer: >60 mL/min (ref >60)
GFR calc non Af Amer: 58 mL/min — ABNORMAL LOW (ref >60)
Glucose, Bld: 119 mg/dL — ABNORMAL HIGH (ref 70–99)
Potassium: 3.1 mEq/L — ABNORMAL LOW (ref 3.5–5.1)
Sodium: 138 mEq/L (ref 135–145)
Total Bilirubin: 0.9 mg/dL (ref 0.3–1.2)
Total Protein: 6.1 g/dL (ref 6.0–8.3)

## 2010-12-30 LAB — URINALYSIS, ROUTINE W REFLEX MICROSCOPIC
Glucose, UA: NEGATIVE mg/dL
Ketones, ur: NEGATIVE mg/dL
Nitrite: NEGATIVE
Protein, ur: 100 mg/dL — AB
Specific Gravity, Urine: 1.02 (ref 1.005–1.030)
Urobilinogen, UA: 2 mg/dL — ABNORMAL HIGH (ref 0.0–1.0)
pH: 6.5 (ref 5.0–8.0)

## 2010-12-30 LAB — URINE CULTURE: Colony Count: >=100000

## 2010-12-30 LAB — DIFFERENTIAL
Basophils Absolute: 0 10*3/uL (ref 0.0–0.1)
Basophils Relative: 0 % (ref 0–1)
Eosinophils Absolute: 0.1 10*3/uL (ref 0.0–0.7)
Eosinophils Relative: 1 % (ref 0–5)
Lymphocytes Relative: 14 % (ref 12–46)
Lymphs Abs: 1.5 10*3/uL (ref 0.7–4.0)
Monocytes Absolute: 0.6 10*3/uL (ref 0.1–1.0)
Monocytes Relative: 5 % (ref 3–12)
Neutro Abs: 8.3 10*3/uL — ABNORMAL HIGH (ref 1.7–7.7)
Neutrophils Relative %: 80 % — ABNORMAL HIGH (ref 43–77)

## 2010-12-30 LAB — URINE MICROSCOPIC-ADD ON

## 2010-12-30 LAB — CBC
HCT: 40.3 % (ref 36.0–46.0)
Hemoglobin: 13.4 g/dL (ref 12.0–15.0)
MCHC: 33.3 g/dL (ref 30.0–36.0)
MCV: 95.5 fL (ref 78.0–100.0)
Platelets: 252 10*3/uL (ref 150–400)
RBC: 4.22 MIL/uL (ref 3.87–5.11)
RDW: 13.4 % (ref 11.5–15.5)
WBC: 10.5 10*3/uL (ref 4.0–10.5)

## 2010-12-30 LAB — GLUCOSE, CAPILLARY: Glucose-Capillary: 116 mg/dL — ABNORMAL HIGH (ref 70–99)

## 2011-01-15 LAB — CBC
HCT: 35.7 % — ABNORMAL LOW (ref 36.0–46.0)
HCT: 35.2 % — ABNORMAL LOW (ref 36.0–46.0)
HCT: 37.7 % (ref 36.0–46.0)
Hemoglobin: 12.1 g/dL (ref 12.0–15.0)
Hemoglobin: 12.3 g/dL (ref 12.0–15.0)
Hemoglobin: 12.8 g/dL (ref 12.0–15.0)
MCHC: 33.9 g/dL (ref 30.0–36.0)
MCHC: 34.9 g/dL (ref 30.0–36.0)
MCHC: 33.9 g/dL (ref 30.0–36.0)
MCV: 95.7 fL (ref 78.0–100.0)
MCV: 93.2 fL (ref 78.0–100.0)
MCV: 95.2 fL (ref 78.0–100.0)
Platelets: 216 10*3/uL (ref 150–400)
Platelets: 173 10*3/uL (ref 150–400)
Platelets: 192 10*3/uL (ref 150–400)
RBC: 3.73 MIL/uL — ABNORMAL LOW (ref 3.87–5.11)
RBC: 3.78 MIL/uL — ABNORMAL LOW (ref 3.87–5.11)
RBC: 3.96 MIL/uL (ref 3.87–5.11)
RDW: 13.4 % (ref 11.5–15.5)
RDW: 13 % (ref 11.5–15.5)
RDW: 13.7 % (ref 11.5–15.5)
WBC: 13.9 10*3/uL — ABNORMAL HIGH (ref 4.0–10.5)
WBC: 11.1 10*3/uL — ABNORMAL HIGH (ref 4.0–10.5)
WBC: 10.5 10*3/uL (ref 4.0–10.5)

## 2011-01-15 LAB — DIFFERENTIAL
Basophils Absolute: 0 10*3/uL (ref 0.0–0.1)
Basophils Absolute: 0 10*3/uL (ref 0.0–0.1)
Basophils Relative: 0 % (ref 0–1)
Basophils Relative: 0 % (ref 0–1)
Eosinophils Absolute: 0 10*3/uL (ref 0.0–0.7)
Eosinophils Absolute: 0 10*3/uL (ref 0.0–0.7)
Eosinophils Relative: 0 % (ref 0–5)
Eosinophils Relative: 0 % (ref 0–5)
Lymphocytes Relative: 11 % — ABNORMAL LOW (ref 12–46)
Lymphocytes Relative: 16 % (ref 12–46)
Lymphs Abs: 1.5 10*3/uL (ref 0.7–4.0)
Lymphs Abs: 1.8 10*3/uL (ref 0.7–4.0)
Monocytes Absolute: 0.9 10*3/uL (ref 0.1–1.0)
Monocytes Absolute: 0.7 10*3/uL (ref 0.1–1.0)
Monocytes Relative: 6 % (ref 3–12)
Monocytes Relative: 6 % (ref 3–12)
Neutro Abs: 11.5 10*3/uL — ABNORMAL HIGH (ref 1.7–7.7)
Neutro Abs: 8.5 10*3/uL — ABNORMAL HIGH (ref 1.7–7.7)
Neutrophils Relative %: 83 % — ABNORMAL HIGH (ref 43–77)
Neutrophils Relative %: 77 % (ref 43–77)

## 2011-01-15 LAB — PROTEIN, URINE, 24 HOUR
Collection Interval-UPROT: 24 hours
Protein, 24H Urine: 129 mg/d — ABNORMAL HIGH (ref 50–100)
Protein, Urine: 3 mg/dL
Urine Total Volume-UPROT: 4300 mL

## 2011-01-15 LAB — GLUCOSE, CAPILLARY
Glucose-Capillary: 112 mg/dL — ABNORMAL HIGH (ref 70–99)
Glucose-Capillary: 131 mg/dL — ABNORMAL HIGH (ref 70–99)
Glucose-Capillary: 139 mg/dL — ABNORMAL HIGH (ref 70–99)

## 2011-01-15 LAB — DIC (DISSEMINATED INTRAVASCULAR COAGULATION) PANEL (NOT AT ARMC)
D-Dimer, Quant: 0.85 ug/mL-FEU — ABNORMAL HIGH (ref 0.00–0.48)
Fibrinogen: 696 mg/dL — ABNORMAL HIGH (ref 204–475)
Platelets: 216 10*3/uL (ref 150–400)
Prothrombin Time: 13.5 seconds (ref 11.6–15.2)
aPTT: 29 seconds (ref 24–37)

## 2011-01-15 LAB — RAPID URINE DRUG SCREEN, HOSP PERFORMED
Amphetamines: NOT DETECTED
Barbiturates: NOT DETECTED
Benzodiazepines: NOT DETECTED
Cocaine: NOT DETECTED
Opiates: NOT DETECTED
Tetrahydrocannabinol: NOT DETECTED

## 2011-01-15 LAB — URINALYSIS, ROUTINE W REFLEX MICROSCOPIC
Bilirubin Urine: NEGATIVE
Glucose, UA: NEGATIVE mg/dL
Hgb urine dipstick: NEGATIVE
Ketones, ur: 40 mg/dL — AB
Nitrite: NEGATIVE
Protein, ur: NEGATIVE mg/dL
Specific Gravity, Urine: 1.025 (ref 1.005–1.030)
Urobilinogen, UA: 0.2 mg/dL (ref 0.0–1.0)
pH: 6 (ref 5.0–8.0)

## 2011-01-15 LAB — COMPREHENSIVE METABOLIC PANEL
ALT: 13 U/L (ref 0–35)
ALT: 16 U/L (ref 0–35)
ALT: 20 U/L (ref 0–35)
AST: 16 U/L (ref 0–37)
AST: 16 U/L (ref 0–37)
AST: 27 U/L (ref 0–37)
Albumin: 2.6 g/dL — ABNORMAL LOW (ref 3.5–5.2)
Albumin: 2.6 g/dL — ABNORMAL LOW (ref 3.5–5.2)
Albumin: 2.7 g/dL — ABNORMAL LOW (ref 3.5–5.2)
Alkaline Phosphatase: 80 U/L (ref 39–117)
Alkaline Phosphatase: 67 U/L (ref 39–117)
Alkaline Phosphatase: 80 U/L (ref 39–117)
BUN: 10 mg/dL (ref 6–23)
BUN: 7 mg/dL (ref 6–23)
BUN: 5 mg/dL — ABNORMAL LOW (ref 6–23)
CO2: 23 mEq/L (ref 19–32)
CO2: 25 mEq/L (ref 19–32)
CO2: 21 mEq/L (ref 19–32)
Calcium: 8.8 mg/dL (ref 8.4–10.5)
Calcium: 8.5 mg/dL (ref 8.4–10.5)
Calcium: 7.6 mg/dL — ABNORMAL LOW (ref 8.4–10.5)
Chloride: 102 mEq/L (ref 96–112)
Chloride: 107 mEq/L (ref 96–112)
Chloride: 106 mEq/L (ref 96–112)
Creatinine, Ser: 0.73 mg/dL (ref 0.4–1.2)
Creatinine, Ser: 0.65 mg/dL (ref 0.4–1.2)
Creatinine, Ser: 0.76 mg/dL (ref 0.4–1.2)
GFR calc Af Amer: >60 mL/min (ref >60)
GFR calc Af Amer: >60 mL/min (ref >60)
GFR calc Af Amer: >60 mL/min (ref >60)
GFR calc non Af Amer: >60 mL/min (ref >60)
GFR calc non Af Amer: >60 mL/min (ref >60)
GFR calc non Af Amer: >60 mL/min (ref >60)
Glucose, Bld: 82 mg/dL (ref 70–99)
Glucose, Bld: 79 mg/dL (ref 70–99)
Glucose, Bld: 202 mg/dL — ABNORMAL HIGH (ref 70–99)
Potassium: 4.1 mEq/L (ref 3.5–5.1)
Potassium: 3.6 mEq/L (ref 3.5–5.1)
Potassium: 4 mEq/L (ref 3.5–5.1)
Sodium: 131 mEq/L — ABNORMAL LOW (ref 135–145)
Sodium: 136 mEq/L (ref 135–145)
Sodium: 134 mEq/L — ABNORMAL LOW (ref 135–145)
Total Bilirubin: 1.1 mg/dL (ref 0.3–1.2)
Total Bilirubin: 0.9 mg/dL (ref 0.3–1.2)
Total Bilirubin: 0.3 mg/dL (ref 0.3–1.2)
Total Protein: 5.7 g/dL — ABNORMAL LOW (ref 6.0–8.3)
Total Protein: 5.6 g/dL — ABNORMAL LOW (ref 6.0–8.3)
Total Protein: 6.2 g/dL (ref 6.0–8.3)

## 2011-01-15 LAB — CREATININE CLEARANCE, URINE, 24 HOUR
Collection Interval-CRCL: 24 hours
Creatinine Clearance: 181 mL/min — ABNORMAL HIGH (ref 75–115)
Creatinine, 24H Ur: 1977 mg/d — ABNORMAL HIGH (ref 700–1800)
Creatinine, Urine: 46 mg/dL
Creatinine: 0.76 mg/dL (ref 0.40–1.20)
Urine Total Volume-CRCL: 4300 mL

## 2011-01-15 LAB — URINE MICROSCOPIC-ADD ON

## 2011-01-15 LAB — DIC (DISSEMINATED INTRAVASCULAR COAGULATION)PANEL
INR: 1.04 (ref 0.00–1.49)
Smear Review: NONE SEEN

## 2011-01-15 LAB — STREP B DNA PROBE: Strep Group B Ag: NEGATIVE

## 2011-01-15 LAB — URIC ACID: Uric Acid, Serum: 4.2 mg/dL (ref 2.4–7.0)

## 2011-01-15 LAB — RPR: RPR Ser Ql: NONREACTIVE

## 2011-02-24 ENCOUNTER — Telehealth: Payer: Self-pay | Admitting: Family Medicine

## 2011-02-24 MED ORDER — AMLODIPINE BESYLATE 10 MG PO TABS: 10.0000 mg | ORAL_TABLET | Freq: Every day | ORAL | Status: DC

## 2011-02-24 MED ORDER — TRIAMTERENE-HCTZ 75-50 MG PO TABS: ORAL_TABLET | ORAL | Status: DC

## 2011-02-24 MED ORDER — CLONIDINE HCL 0.3 MG PO TABS: ORAL_TABLET | ORAL | Status: DC

## 2011-02-24 NOTE — Telephone Encounter (Signed)
Sent as requested.

## 2011-02-28 NOTE — Discharge Summary (Signed)
[name], [name]                          ACCOUNT NO.:  [id number]   MEDICAL RECORD NO.:  [id number]                   PATIENT TYPE:  INP   LOCATION:  A310                                 FACILITY:  [facility]   PHYSICIAN:  Jerolyn Shin C. Katrinka Blazing, M.D.                DATE OF BIRTH:  04/26/1971   DATE OF ADMISSION:  05/17/2002  DATE OF DISCHARGE:  05/19/2002                                 DISCHARGE SUMMARY   DISCHARGE DIAGNOSES:  1. Chronic cholecystitis.  2. Chronic appendicitis.  3. Incarcerated hernia of the lower semilunar line.   SPECIAL PROCEDURE:  Diagnostic laparoscopy, laparoscopic cholecystectomy,  laparoscopic appendectomy, and laparoscopic-assisted hernia repair.   SURGEON:  Dirk Dress. Katrinka Blazing, M.D.   DISCHARGE MEDICATIONS:  1. Tylox one or two q.4h. as needed for pain.  2. Phenergan 25 mg q.4h. as needed for nausea.  3. Imipramine 50 mg at h.s.  4. Covera 240 mg q.d.  5. Maxzide 50 mg q.d.  6. Catapres-TTS-3 every seven days.   FOLLOW-UP:  She will be seen in the office on April 24, 2002 by me and on  June 30, 2002 by Dr. Lodema Hong.   SUMMARY:  A 40 year old female with history of acute onset of abdominal pain  with nausea, vomiting, and diarrhea on the Wednesday prior to admission.  Her vomiting persisted and she had five episodes of diarrhea per day.  She  was seen in the emergency room and evaluated.  She was told that she had  possible gallbladder disease and was scheduled for an ultrasound.  Her  symptoms persisted and she could not get the ultrasound because of family  emergency.  The pain moved to the right lower quadrant and right flank and  had persisted since then.  She still had severe pain and nausea with  vomiting.  She had pain with most movement, especially with straightening  her leg.  She was admitted from the office in moderately-severe distress.  She was afebrile but had a white count of 12,400 with 81 segs and 14 lymphs.  Sed rate was 20, amylase  112, lipase 21.  Potassium 3.8.  Liver function  tests revealed SGPT of 65, SGOT of 34, and alkaline phos at 157.  CT of  abdomen was nondiagnostic because of lack of oral contrast.  The patient was  scheduled for an ultrasound shortly after admission.   PAST HISTORY:  Positive for hypertension, migraine headaches, and hiatal  hernia.   MEDICATIONS:  Maxzide, Covera, Diovan, Phenergan, and imipramine.   PHYSICAL EXAMINATION:  VITAL SIGNS:  She was afebrile, blood pressure  166/87, pulse 94, respirations 20.  GENERAL:  There was no evidence of jaundice.  LUNGS:  Clear.  ABDOMEN:  Distended with moderate tenderness in the epigastrium and right  upper quadrant with more severe tenderness in right lower quadrant with  guarding and rebound.  She did not have any palpable abdominal  masses.   HOSPITAL COURSE:  She presented with an acute abdomen with slightly altered  clinical picture with nausea, vomiting, and diarrhea, with right upper  quadrant and right lower quadrant tenderness, with low-grade leukocytosis.  It was felt that we would delay her treatment until we could further  evaluate her abdomen.  Ultrasound was the upper abdomen was interpreted as  normal without gallstones.  We proceeded with an ultrasound of the lower  abdomen.  This was unremarkable.  She continued to do poorly.  She had  postprandial nausea and pain.  HIDA scan showed ejection fraction of 3.2%.  She was counseled for possible laparoscopic cholecystectomy and  appendectomy.  On exploration she was found to have chronic appendicitis,  chronic cholecystitis, and an incarcerated hernia in the lower semilunar  line.  The hernia was fixed laparoscopically, cholecystectomy was done, and  appendix was removed.  She became totally asymptomatic after surgery.  She  felt much better.  White count returned to normal.  She had no further  problems and was discharged home on the afternoon of May 19, 2002 in  satisfactory  condition.                                                Dirk Dress. Katrinka Blazing, M.D.    LCS/MEDQ  D:  07/10/2002  T:  07/11/2002  Job:  [id number]

## 2011-02-28 NOTE — Op Note (Signed)
[name], [name]                          ACCOUNT NO.:  [id number]   MEDICAL RECORD NO.:  [id number]                   PATIENT TYPE:  INP   LOCATION:  A310                                 FACILITY:  [facility]   PHYSICIAN:  Jerolyn Shin C. Katrinka Blazing, M.D.                DATE OF BIRTH:  1971-09-09   DATE OF PROCEDURE:  05/17/2002  DATE OF DISCHARGE:  05/19/2002                                 OPERATIVE REPORT   INDICATIONS FOR PROCEDURE:  This 40 year old female with a history of  recurrent abdominal pain, post prandial nausea and vomiting, epigastric and  right upper quadrant, but also with right lower quadrant.  She was admitted  to the hospital last week and underwent diagnostic studies with no  significant findings except for poorly functioning gallbladder with a  gallbladder ejection fraction of 3%.  The patient was admitted through Day  Surgery to have diagnostic laparoscopy and cholecystectomy.   DESCRIPTION OF PROCEDURE:  Under general anesthesia, the patient's abdomen  was prepped and draped in the sterile field.  Supraumbilical incision was  made and Veress needle was inserted uneventfully.  Abdomen was insufflated  with 3 L of CO2.  Using a DigiPort guide, a 10-mm port was placed.  Laparoscope was placed.  Laparoscopic evaluation was  carried out with the  patient in Trendelenburg position.  Two cystic ovaries with a ruptured  ovarian corpus luteum was noted in the left ovary.  The right ovary showed  chronic cystic changes.  There was no inflammation of the ovaries.  There  was a small anterior and superior uterine fibroid.  There was no evidence of  pelvic inflammation.  Examination of the lower abdomen reveals some  adhesions to the anterior abdominal wall which were taken down bluntly and  with the electrocautery.  There was another piece of omentum that seemed  initially to be adherent to the abdominal wall but was actually incarcerated  in a hernia along the simian line.  This  this carried about in the middle  third of the simian line.  These adhesions were taken down and the omentum  was divided.  A probe was placed into the hernia and it extended into the  deep subcutaneous tissue in the right lower quadrant.  This would definitely  explain some of her recurrent right lower quadrant pain.  Once this was  done, the appendix was isolated.  There was chronic inflammation with  redness and thickness of the tip of the appendix with some increased fat  surrounding the appendix.  The base of the appendix was mobilized and the  mesoappendix was tied with an Endo GIA with vascular staples.  The base of  the appendix was divided with an Endo GIA with  vascular staples.  This was  irrigated.  There was no evidence of bleeding from the staple line.  The  appendix was retrieved in an EndoCatch device.  Next, attention was then  turned to the incisional hernia with the incarcerated omentum.  It was felt  that we would try to extract the incarcerated tissue laparoscopically but  after much dissection, it appeared that the omentum was deeply incarcerated  and it would need to be removed primarily.   At that point, I moved to the opposite side of the table and a transverse  incision was made over the mass.  The incision was extended down to the  clamp that had been pushed into the subcutaneous fat and subfascial plane.  Once this was identified , the clamp was pushed through the remaining  tissue.  Dissection was carried out around the clamp with the finding of the  incarcerated omentum.  This omentum was removed.  The hole was then better  defined.  The hole was closed with interrupted figure-of-eight sutures of  Prolene.  Next, attention was then turned to the gallbladder.  The patient  was placed in reverse Trendelenburg position.  Right upper quadrant  transverse incision was made and a 10-mm port was placed under videoscopic  guidance.  Multiple 5-mm ports were also  placed.  Once this was done, the  gallbladder was grasped in position.  Cystic duct was isolated, covered with  multiple clips and divided. Once this was completed, the cystic duct and the  cystic artery were serially clipped, divided.  The gallbladder was separated  from the intrahepatic space with electrocautery.  It was separated intact.  It was retrieved intact without difficulty.  Next, hemostasis of the bed was  carried out and it appeared to be adequate.  Next, copious irrigation was  carried out.   Inspection of the pelvis was carried out and there was no evidence of  further bleeding.  Inspection of the hernia repair revealed it to be  adequately repair.  CO2 was allowed to escape from the abdomen and the ports  were removed.  The larger incisions were closed with 0 Dexon.  The skin was  closed with staples.  The patient tolerated the procedure well.  Sterile  dressing were placed.  She was awakened from anesthesia uneventfully,  transferred to her bed and taken to the Post Anesthetic Care Unit.                                               Dirk Dress. Katrinka Blazing, M.D.    LCS/MEDQ  D:  05/17/2002  T:  05/20/2002  Job:  [id number]

## 2011-02-28 NOTE — Op Note (Signed)
NAMEBRITTONY, BILLICK                   ACCOUNT NO.:  [id number]   MEDICAL RECORD NO.:  [id number]          PATIENT TYPE:  AMB   LOCATION:  DAY                           FACILITY:  [facility]   PHYSICIAN:  Lionel December, M.D.    DATE OF BIRTH:  01-04-1971   DATE OF PROCEDURE:  07/26/2004  DATE OF DISCHARGE:                                 OPERATIVE REPORT   PROCEDURE:  Esophagogastroduodenoscopy.   INDICATIONS:  [name] is a 40 year old African American female with recent  onset of right upper quadrant/epigastric pain with nausea and vomiting.  On  one occasion, she vomited coffee ground material.  She had CT scan recently  during a visit to the emergency room which did not show any abnormality that  would account for her symptoms.  Her lab studies have been normal except  mild leukocytosis and borderline alkaline phosphatase.  She is feeling some  better with Aciphex. She is undergoing diagnostic EGD.  The procedure was  reviewed with the patient and informed consent was obtained.   PREMEDICATION:  Cetacaine spray for oropharyngeal topical anesthesia,  Demerol 50 mg IV, Versed 7 mg IV in divided doses.   FINDINGS:  The procedures were performed in the endoscopy suite.  The  patient's vital signs and O2 saturation were monitored during the procedure  and remained stable.   The patient was placed in the left lateral recumbent position and Olympus  videoscope was passed through the oropharynx without any difficulty and into  the esophagus.   Esophagus:  Mucosa of the esophagus was normal.  No sooner the scope was  passed into the esophagus than she started to heave.  Scope was pulled out.  She was given more medication and this time she did much better.  Hernia was  seen.  Mucosa of the esophagus was normal.  There was small sliding hiatal  hernia which was apparent while she was heaving.  Endoscope was withdrawn.  The patient was given more Versed and examination repeated and she did much  better.   Stomach:  It was empty and distended very well with insufflation.  Folds of  the proximal stomach were normal.  Examination of the mucosa revealed antral  erythema and a  few tiny prepyloric erosions.  Pyloric channel was patent.  Angularis, fundus and cardia were examined by retroflexing the scope over  the petechiae and were normal.   Duodenum:  Examination of the bulb revealed normal mucosa.  Scope was passed  to the second part of the duodenum.  Mucosa and folds were normal.   The endoscope was withdrawn.  The patient tolerated the procedure well.   FINAL DIAGNOSES:  1.  Small sliding hiatal hernia which is only apparent after she heaved a      few times.  2.  Erosive antral gastritis.   RECOMMENDATIONS:  1.  H pylori serology will be checked.  2.  Will continue Aciphex at 20 mg p.o. q.a.m.  3.  We will plan to see the patient back in two weeks.  However, if her  symptoms relapse, will consider small bowel study.   The endoscope was withdrawn.  The patient tolerated the procedure well.   FINAL DIAGNOSIS:  1.  No evidence of esophageal and/or fundal varices.  2.  Small sliding hiatal hernia with nausea and reflux esophagitis limited      to gastroesophageal junction.  3.  Nonerosive antral gastritis.  This may be due to Helicobacter pylori or      perhaps very early portal gastropathy.   RECOMMENDATIONS:  1.  Antireflux measures.  2.  Omeprazole 20 mg p.o. q.a.m.  3.  She will follow with Dr. Sharon Mt and associates as planned.     Naje   NR/MEDQ  D:  07/26/2004  T:  07/26/2004  Job:  [id number]

## 2011-02-28 NOTE — Discharge Summary (Signed)
[name], [name]                          ACCOUNT NO.:  [id number]   MEDICAL RECORD NO.:  [id number]                   PATIENT TYPE:  INP   LOCATION:  A305                                 FACILITY:  [facility]   PHYSICIAN:  Milus Mallick. Lodema Hong, M.D.           DATE OF BIRTH:  1970-10-30   DATE OF ADMISSION:  05/09/2002  DATE OF DISCHARGE:  05/12/2002                                 DISCHARGE SUMMARY   DISCHARGE DIAGNOSES:  1. Abdominal pain.  2. Severe hypertension.  3. Morbid obesity.  4. Migraine headaches.  5. History of ovarian cyst.   HISTORY OF PRESENT ILLNESS:  In summary, the patient is a 40 year old  [place] female who presented to the office with a six-day history  of abdominal pain and vomiting.  She denied any fever or chills.  She  reported that the pain was initially periumbilical and at the time of  presentation radiated to be maximal in the right lower quadrant.  The  patient had been seen in the emergency room prior to coming to the office  and at that time was advised that she had probable gallbladder disease.  She  had been scheduled to return for an ultrasound of the gallbladder; however,  was unable to keep that appointment and came to the office feeling  significantly worse.   PAST MEDICAL HISTORY:  Significant for hypertension, migraine headaches, and  obesity.   PAST SURGICAL HISTORY:  She is status post removal of right ovarian cyst on  two previous occasions in 1993 and 1995.   ALLERGIES:  None known.   MEDICATIONS AT TIME OF ADMISSION:  1. Maxzide 50 mg 1 daily.  2. Diovan 220 mg 1 daily.  3. Covera HS 2000 mg 1 daily.  4. Imipramine 50 mg 1 at bedtime.   SOCIAL HISTORY:  The patient is the single mother of two children.  She  denies any tobacco or alcohol use.  She works with [employer].   PHYSICAL EXAMINATION:  GENERAL:  She is a young female in obvious pain.  VITAL SIGNS:  She is afebrile with a temperature of 98.2 degrees,  heart rate  was 94, respirations 12, blood pressure 180/130.  HEENT:  TM's were clear bilaterally, extraocular muscles were intact,  oropharynx was normal.  NECK:  Supple.  No JVD, no bruits.  CHEST:  Clear to auscultation bilaterally with no crackles or wheezes heard.  CARDIOVASCULAR:  Heart sounds 1 and 2 normal, no S3.  ABDOMEN:  Obese, soft, and there was positive tenderness maximally in the  right lower quadrant.  There was some rebound present also.  She was also  guarding in the right lower quadrant.  EXTREMITIES:  Negative for edema.  RECTAL:  The rectal exam revealed guaiac negative stools with tenderness.   LABORATORY DATA:  On admission:  White cell count 12.4, hemoglobin 14.3,  platelets 296.  Basophils were 81%, elevated.  Lymphocytes  14, monocytes 5%.  Chemistries:  Sodium 133, potassium 3.3, chloride 97, CO2 28, BUN 16,  creatinine 0.9, glucose 101.  Calcium was 9.2, total protein 7.4, albumin  4.0.  AST 34, ALT 65, alkaline phosphatase 157, total bilirubin 1.5,  indirect bilirubin 1.3, amylase 112, lipase 211.  A clean catch UA showed  cloudy urine only.   She had an abdominal and pelvic CT scan which showed fatty infiltration of  the liver.  No evidence of abnormal fluid collection or obvious abnormal  inflammatory process.  Spleen, pancreas, adrenal glands, and kidneys were  unremarkable.  The patient was unable to tolerate oral contrast, so the  evaluation was limited.  Pelvic CT scan showed no evidence of appendicitis,  prominent ovaries, and an ultrasound was recommended for further delineation  of the ovaries.   HOSPITAL COURSE:  The patient was admitted to the medical floor where  initially she received aggressive pain control, as well as control of her  blood pressure.  She was placed on a morphine PCA pump, which she tolerated  well.  Surgery was consulted and Dr. __________ evaluated the patient in the  office prior to her admission.  Her blood pressure was  controlled on her  regular medications, none of which she had taken prior to admission that day  secondary to extreme nausea.  After an initial 24-hour period when she  required telemetry for closer monitor of her cardiovascular system, she was  able to be transferred to a regular medical floor while her workup for  abdominal pain was in progress.  The pelvic ultrasound showed a couple of  small benign cervical nabothian cysts.  Endometrial complex measured 12.1 mm  in thickness.  Uterus was 12.9 cm in length.  Fundus 3.5 x 6.1 cm in AP and  transverse dimensions.  The right ovary measurements were 1.8 x 2.3 x 3.2 cm  and left 1.8 x 1.9 x 2.5 cm dimension.  No mass or free fluid was seen on  pelvic ultrasound, so the overall impression was that her pelvic ultrasound  was negative.   Attempts were made to start the patient on a liquid diet, which she was able  to tolerate.  A gallbladder ultrasound was negative for stones.  At that  time, the patient was able to keep fluids down and her lab data had  improved.  She was discharged home with a HIDA scan pending, to be  readmitted three days later for a diagnostic laparoscopy, since her symptoms  and signs persisted.  Of significance, the HIDA scan did show that she had  an essentially nonfunctional gallbladder with an ejection fraction of 3%.  She was readmitted to the hospital on August 5 for diagnostic laparoscopy,  as well as a cholecystectomy.  At that time, she was found to have chronic  appendicitis and this also was removed.   DISCHARGE MEDICATIONS:  1. Maxzide 50 mg daily.  2. Catapres-TTS-3 patch for her blood pressure management and Diovan was     discontinued.  3. Provera 240 mg at night.  4. Imipramine 50 mg at night.  5. Phenergan 25 mg every four hours as needed for nausea.  6. Vicodin ES 7.5 mg 2 four times daily as needed for pain.  Milus Mallick. Lodema Hong, M.D.    MES/MEDQ   D:  05/31/2002  T:  06/01/2002  Job:  [id number]

## 2011-06-11 ENCOUNTER — Encounter: Payer: Self-pay | Admitting: Family Medicine

## 2011-06-11 ENCOUNTER — Ambulatory Visit (INDEPENDENT_AMBULATORY_CARE_PROVIDER_SITE_OTHER): Payer: Self-pay | Admitting: Family Medicine

## 2011-06-11 VITALS — BP 150/102 | HR 98 | Temp 98.1°F | Resp 16 | Ht 64.0 in | Wt 267.0 lb

## 2011-06-11 DIAGNOSIS — E669 Obesity, unspecified: Secondary | ICD-10-CM

## 2011-06-11 DIAGNOSIS — R7301 Impaired fasting glucose: Secondary | ICD-10-CM

## 2011-06-11 DIAGNOSIS — R5381 Other malaise: Secondary | ICD-10-CM

## 2011-06-11 DIAGNOSIS — Z13220 Encounter for screening for lipoid disorders: Secondary | ICD-10-CM

## 2011-06-11 DIAGNOSIS — Z139 Encounter for screening, unspecified: Secondary | ICD-10-CM

## 2011-06-11 DIAGNOSIS — I10 Essential (primary) hypertension: Secondary | ICD-10-CM

## 2011-06-11 DIAGNOSIS — R5383 Other fatigue: Secondary | ICD-10-CM

## 2011-06-11 DIAGNOSIS — J329 Chronic sinusitis, unspecified: Secondary | ICD-10-CM

## 2011-06-11 DIAGNOSIS — J40 Bronchitis, not specified as acute or chronic: Secondary | ICD-10-CM

## 2011-06-11 MED ORDER — PREDNISONE (PAK) 5 MG PO TABS: 5.0000 mg | ORAL_TABLET | ORAL | Status: DC

## 2011-06-11 MED ORDER — FLUCONAZOLE 150 MG PO TABS: 150.0000 mg | ORAL_TABLET | Freq: Once | ORAL | Status: AC

## 2011-06-11 MED ORDER — PENICILLIN V POTASSIUM 500 MG PO TABS: 500.0000 mg | ORAL_TABLET | Freq: Three times a day (TID) | ORAL | Status: AC

## 2011-06-11 NOTE — Progress Notes (Signed)
  Subjective:    Patient ID: [name], female    DOB: 02/06/1971, 40 y.o.   MRN: [id number]  HPI 3 day h/o head and chest congestion with intermittent chills and fever to 100.2. Daughter also sick. Prior to this has been wll. No regular exercise or change in diet, weight is unchanged   Review of Systems    See HPI  Denies chest pains, palpitations and leg swelling Denies abdominal pain, nausea, vomiting,diarrhea or constipation.   Denies dysuria, frequency, hesitancy or incontinence. Denies joint pain, swelling and limitation in mobility. Denies  seizures, numbness, or tingling.c/o headache with sinus pressure Denies depression, anxiety or insomnia. Denies skin break down or rash.     Objective:   Physical Exam  Patient alert and oriented and in no cardiopulmonary distress.  HEENT: No facial asymmetry, EOMI, frontal and maxillary sinus tenderness,  oropharynx pink and moist.  Neck supple no adenopathy.  Chest: adequate air entry, scattered crackles CVS: S1, S2 no murmurs, no S3.  ABD: Soft non tender. Bowel sounds normal.  Ext: No edema  MS: Adequate ROM spine, shoulders, hips and knees.  Skin: Intact, no ulcerations or rash noted.  Psych: Good eye contact, normal affect. Memory intact not anxious or depressed appearing.  CNS: CN 2-12 intact, power, tone and sensation normal throughout.       Assessment & Plan:

## 2011-06-11 NOTE — Patient Instructions (Addendum)
CPE in November.  Fasting labs 3 to 5 days before next visit.  You are being treated  For sinusitis and bronchitis.   Blood pressure is elevated, if persists , then I will increase medication dose  It is important that you exercise regularly at least 30 minutes 5 times a week. If you develop chest pain, have severe difficulty breathing, or feel very tired, stop exercising immediately and seek medical attention      A healthy diet is rich in fruit, vegetables and whole grains. Poultry fish, nuts and beans are a healthy choice for protein rather then red meat. A low sodium diet and drinking 64 ounces of water daily is generally recommended. Oils and sweet should be limited. Carbohydrates especially for those who are diabetic or overweight, should be limited to 34-45 gram per meal. It is important to eat on a regular schedule, at least 3 times daily. Snacks should be primarily fruits, vegetables or nuts.

## 2011-06-12 DIAGNOSIS — J329 Chronic sinusitis, unspecified: Secondary | ICD-10-CM | POA: Insufficient documentation

## 2011-06-12 DIAGNOSIS — J40 Bronchitis, not specified as acute or chronic: Secondary | ICD-10-CM

## 2011-06-12 MED ORDER — TRIAMTERENE-HCTZ 75-50 MG PO TABS: ORAL_TABLET | ORAL | Status: DC

## 2011-06-12 MED ORDER — CEFTRIAXONE SODIUM 500 MG IJ SOLR
500.0000 mg | Freq: Once | INTRAMUSCULAR | Status: AC
  Administered 2011-06-12: 500 mg via INTRAMUSCULAR

## 2011-06-12 MED ORDER — AMLODIPINE BESYLATE 10 MG PO TABS: 10.0000 mg | ORAL_TABLET | Freq: Every day | ORAL | Status: DC

## 2011-06-12 MED ORDER — CLONIDINE HCL 0.3 MG PO TABS: ORAL_TABLET | ORAL | Status: DC

## 2011-06-12 MED ORDER — METHYLPREDNISOLONE ACETATE 80 MG/ML IJ SUSP
80.0000 mg | Freq: Once | INTRAMUSCULAR | Status: AC
  Administered 2011-06-12: 80 mg via INTRAMUSCULAR

## 2011-06-12 NOTE — Assessment & Plan Note (Signed)
Unchanged. Patient re-educated about  the importance of commitment to a  minimum of 150 minutes of exercise per week. The importance of healthy food choices with portion control discussed. Encouraged to start a food diary, count calories and to consider  joining a support group. Sample diet sheets offered. Goals set by the patient for the next several months.    

## 2011-06-12 NOTE — Assessment & Plan Note (Signed)
Antibiotic and steroid administered in office and also prescribed

## 2011-06-12 NOTE — Assessment & Plan Note (Signed)
Antibiotic administered and prescribed, work excuse x 2 days

## 2011-06-12 NOTE — Assessment & Plan Note (Signed)
Uncontrolled. Medication compliance addressed. Commitment to regular exercise, and healthy  eating habits with portion control discussed. DASH diet, and low fat diet discussed, and literature offered. No changes in medication at this time.  

## 2011-06-19 ENCOUNTER — Emergency Department (HOSPITAL_COMMUNITY): Payer: Medicaid Other

## 2011-06-19 ENCOUNTER — Encounter (HOSPITAL_COMMUNITY): Payer: Self-pay | Admitting: Emergency Medicine

## 2011-06-19 ENCOUNTER — Emergency Department (HOSPITAL_COMMUNITY)
Admission: EM | Admit: 2011-06-19 | Discharge: 2011-06-19 | Disposition: A | Discharge status: Home / Self Care | Payer: Medicaid Other | Attending: Emergency Medicine | Admitting: Emergency Medicine

## 2011-06-19 DIAGNOSIS — Z331 Pregnant state, incidental: Secondary | ICD-10-CM | POA: Insufficient documentation

## 2011-06-19 DIAGNOSIS — R109 Unspecified abdominal pain: Secondary | ICD-10-CM

## 2011-06-19 DIAGNOSIS — R1032 Left lower quadrant pain: Secondary | ICD-10-CM | POA: Insufficient documentation

## 2011-06-19 DIAGNOSIS — I10 Essential (primary) hypertension: Secondary | ICD-10-CM | POA: Insufficient documentation

## 2011-06-19 DIAGNOSIS — R1031 Right lower quadrant pain: Secondary | ICD-10-CM | POA: Insufficient documentation

## 2011-06-19 DIAGNOSIS — K5289 Other specified noninfective gastroenteritis and colitis: Secondary | ICD-10-CM

## 2011-06-19 LAB — CBC
HCT: 42.1 % (ref 36.0–46.0)
Hemoglobin: 14.2 g/dL (ref 12.0–15.0)
MCH: 30.2 pg (ref 26.0–34.0)
MCHC: 33.7 g/dL (ref 30.0–36.0)
MCV: 89.6 fL (ref 78.0–100.0)
Platelets: 273 10*3/uL (ref 150–400)
RBC: 4.7 MIL/uL (ref 3.87–5.11)
RDW: 13.7 % (ref 11.5–15.5)
WBC: 11.9 10*3/uL — ABNORMAL HIGH (ref 4.0–10.5)

## 2011-06-19 LAB — URINALYSIS, ROUTINE W REFLEX MICROSCOPIC
Bilirubin Urine: NEGATIVE
Glucose, UA: NEGATIVE mg/dL
Hgb urine dipstick: NEGATIVE
Ketones, ur: NEGATIVE mg/dL
Leukocytes, UA: NEGATIVE
Nitrite: NEGATIVE
Protein, ur: NEGATIVE mg/dL
Specific Gravity, Urine: >1.03 — ABNORMAL HIGH (ref 1.005–1.030)
Urobilinogen, UA: 0.2 mg/dL (ref 0.0–1.0)
pH: 6 (ref 5.0–8.0)

## 2011-06-19 LAB — POCT PREGNANCY, URINE: Preg Test, Ur: POSITIVE

## 2011-06-19 LAB — COMPREHENSIVE METABOLIC PANEL
ALT: 12 U/L (ref 0–35)
AST: 13 U/L (ref 0–37)
Albumin: 3.5 g/dL (ref 3.5–5.2)
Alkaline Phosphatase: 111 U/L (ref 39–117)
BUN: 12 mg/dL (ref 6–23)
CO2: 24 mEq/L (ref 19–32)
Calcium: 9.1 mg/dL (ref 8.4–10.5)
Chloride: 98 mEq/L (ref 96–112)
Creatinine, Ser: 0.72 mg/dL (ref 0.50–1.10)
GFR calc Af Amer: >60 mL/min (ref >60)
GFR calc non Af Amer: >60 mL/min (ref >60)
Glucose, Bld: 87 mg/dL (ref 70–99)
Potassium: 3.5 mEq/L (ref 3.5–5.1)
Sodium: 134 mEq/L — ABNORMAL LOW (ref 135–145)
Total Bilirubin: 0.9 mg/dL (ref 0.3–1.2)
Total Protein: 7.5 g/dL (ref 6.0–8.3)

## 2011-06-19 LAB — HCG, QUANTITATIVE, PREGNANCY: hCG, Beta Chain, Quant, S: 81176 m[IU]/mL — ABNORMAL HIGH (ref <5)

## 2011-06-19 LAB — TYPE AND SCREEN
ABO/RH(D): O POS
Antibody Screen: NEGATIVE

## 2011-06-19 LAB — DIFFERENTIAL
Basophils Absolute: 0 10*3/uL (ref 0.0–0.1)
Basophils Relative: 0 % (ref 0–1)
Eosinophils Absolute: 0 10*3/uL (ref 0.0–0.7)
Eosinophils Relative: 0 % (ref 0–5)
Lymphocytes Relative: 22 % (ref 12–46)
Lymphs Abs: 2.6 10*3/uL (ref 0.7–4.0)
Monocytes Absolute: 0.8 10*3/uL (ref 0.1–1.0)
Monocytes Relative: 6 % (ref 3–12)
Neutro Abs: 8.5 10*3/uL — ABNORMAL HIGH (ref 1.7–7.7)
Neutrophils Relative %: 71 % (ref 43–77)

## 2011-06-19 IMAGING — US US PELVIS COMPLETE
1 series · 14 of 25 positions shown · non-contrast
Comparison: None

CLINICAL DATA: Right lower quadrant pain.  By LMP, the patient is
9 weeks 1 day.  EDC by LMP [DATE]

OBSTETRIC <14 WK US AND TRANSVAGINAL OB US
TECHNIQUE: Both transabdominal and transvaginal ultrasound
examinations were performed for complete evaluation of the
gestation as well as the maternal uterus, adnexal regions, and
pelvic cul-de-sac.  Transvaginal technique was performed to assess
early pregnancy.

[Series 1: us pelvis complete · 0.21mm/px · 14 of 83 slices shown]
[im 1/83]
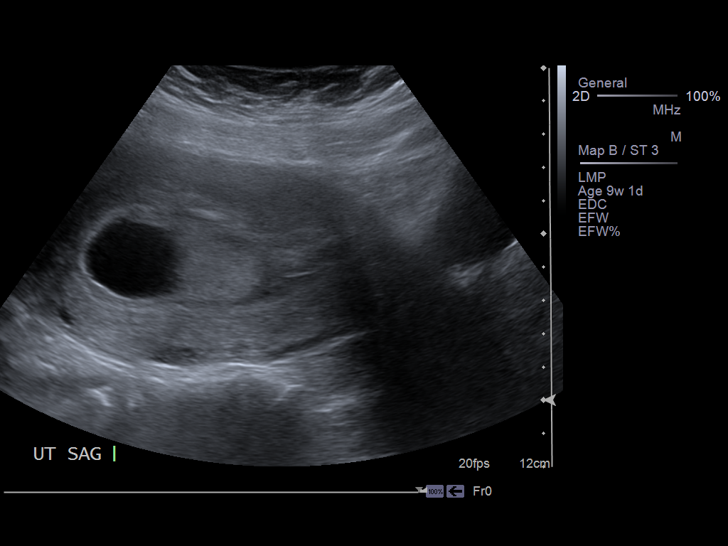
[im 7/83]
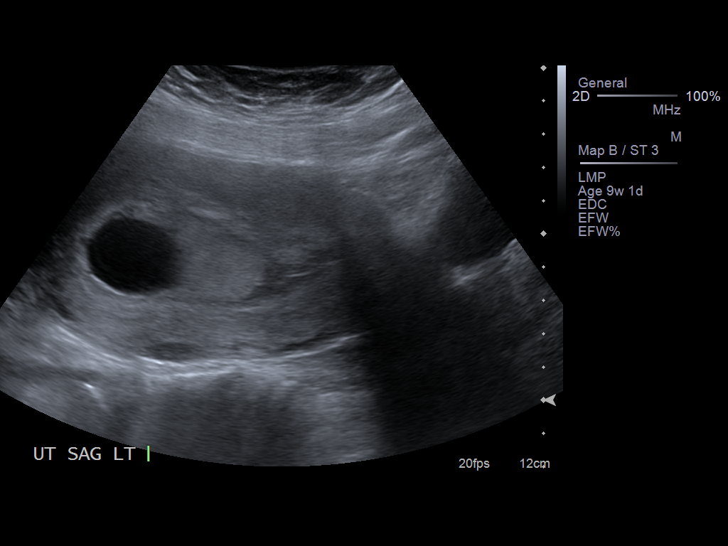
[im 14/83]
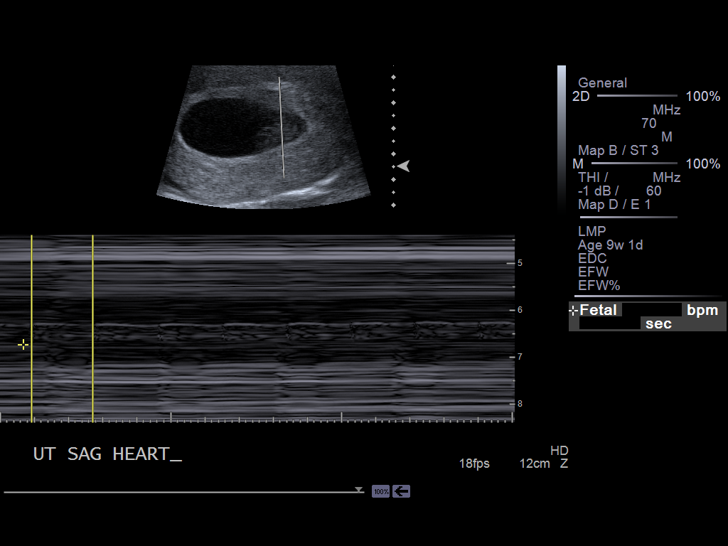
[im 21/83]
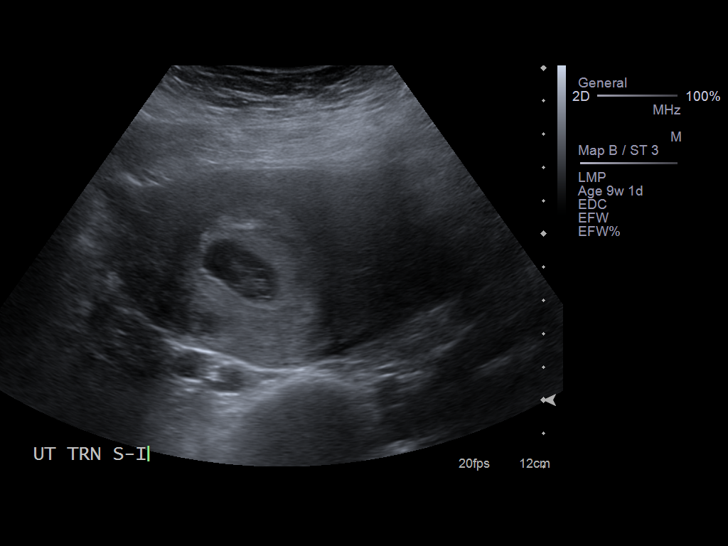
[im 28/83]
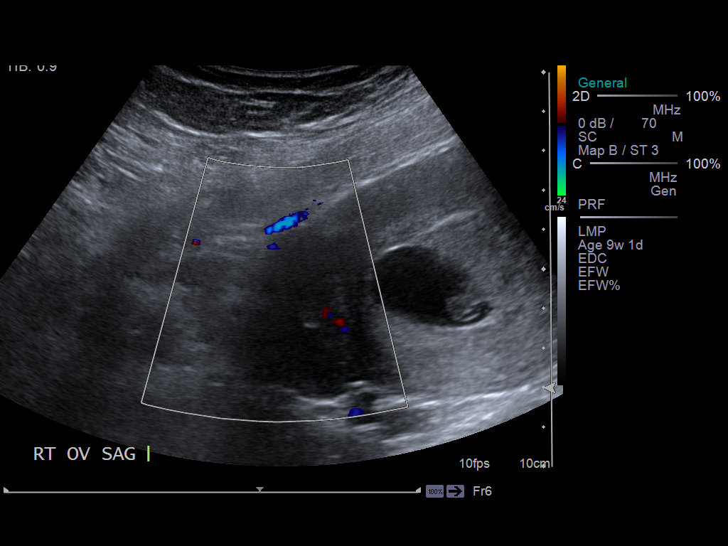
[im 31/83]
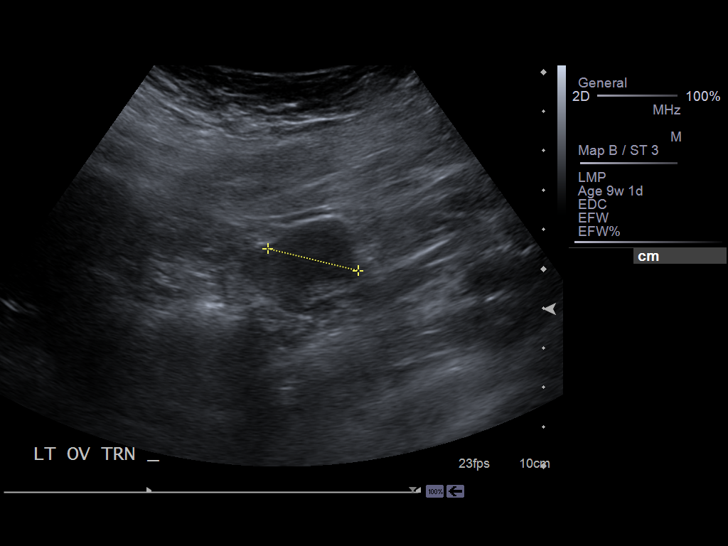
[im 38/83]
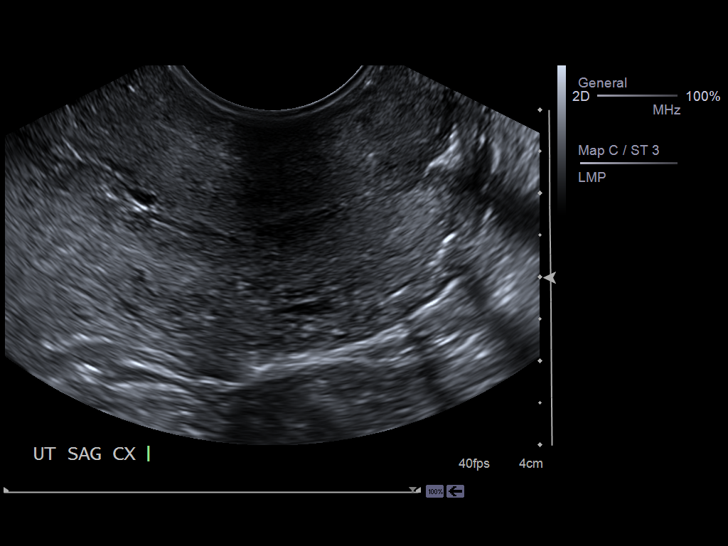
[im 45/83]
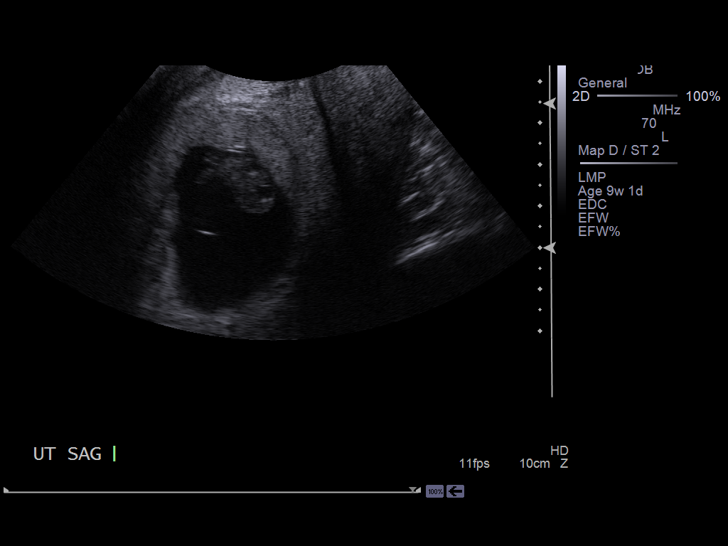
[im 52/83]
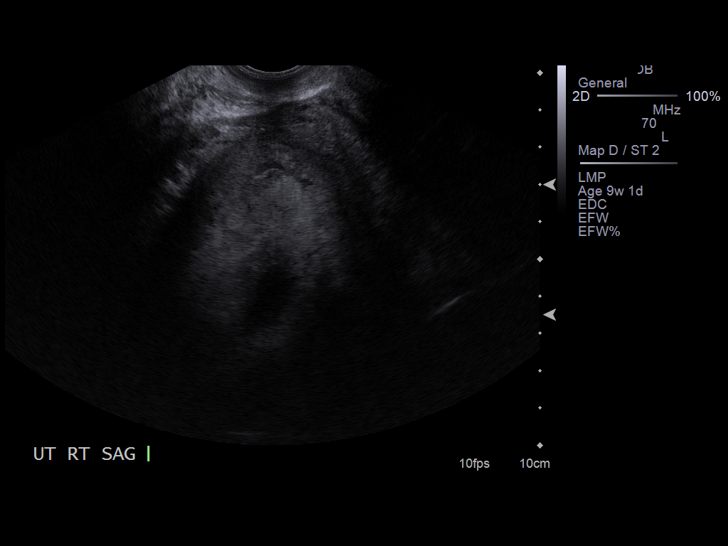
[im 55/83]
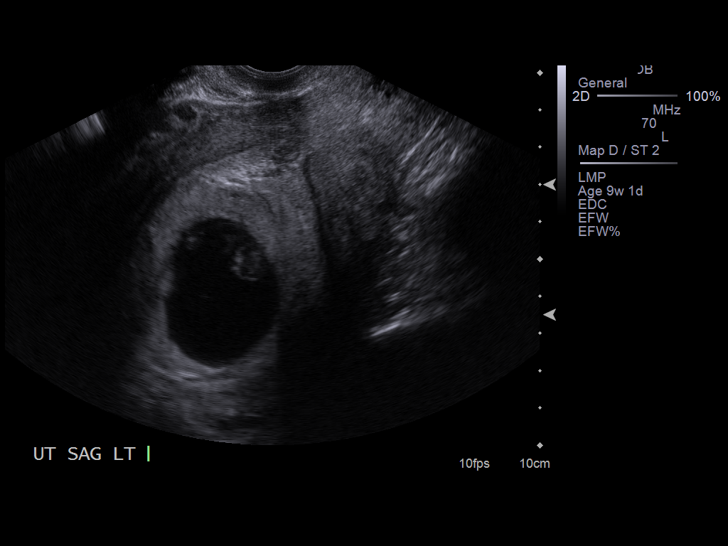
[im 62/83]
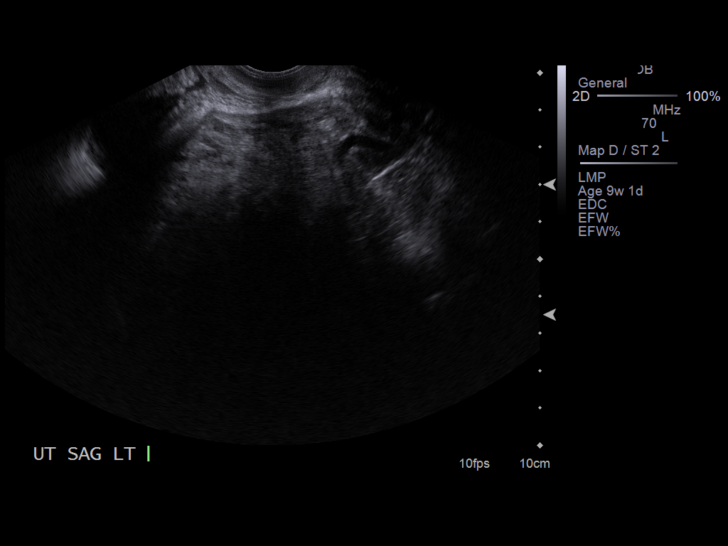
[im 69/83]
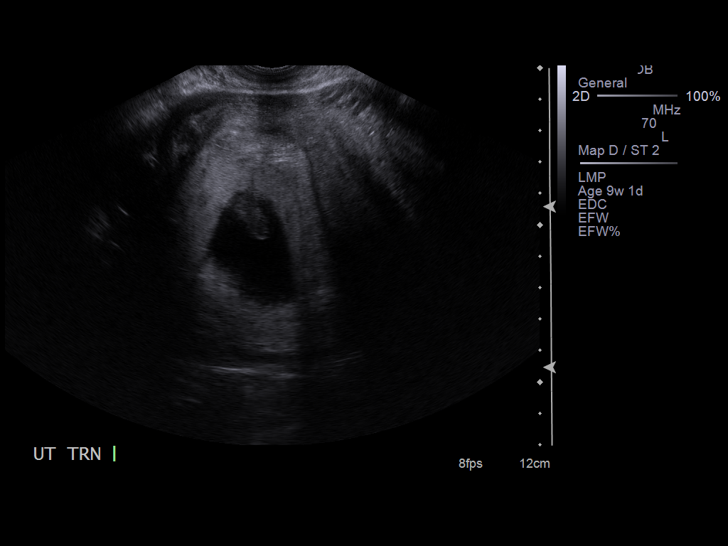
[im 76/83]
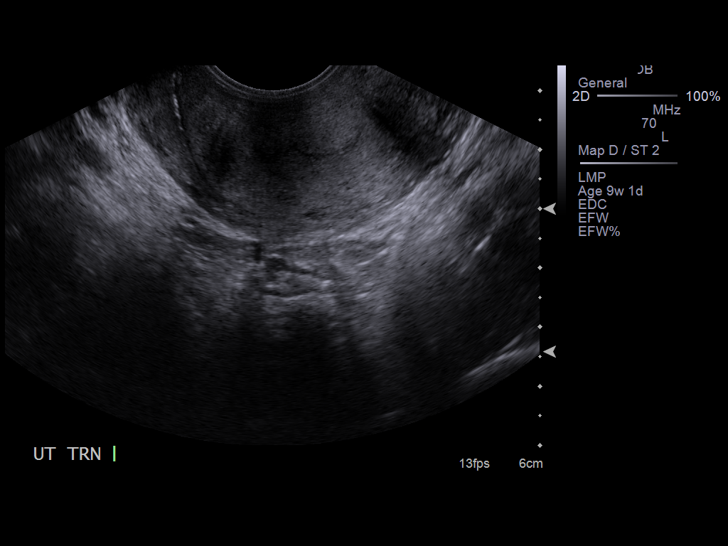
[im 83/83]
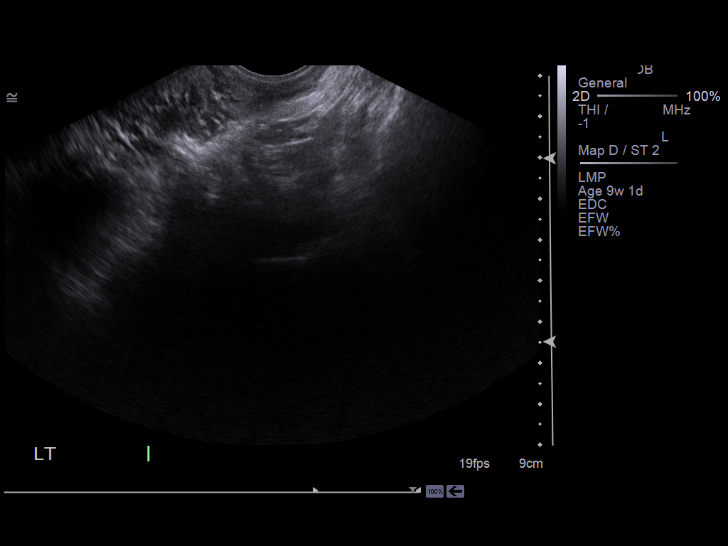

[14 of 25 positions shown; findings below may reference images not displayed]

Intrauterine gestational sac:  Present
Yolk sac: Present
Embryo: Present
Cardiac Activity: Present
Heart Rate: 155 bpm

CRL: 1.9 centimeters   8   w  3   d             US EDC: [DATE]

Maternal uterus/adnexae:
No subchorionic hemorrhage identified.  The right ovary is 4.3 x
3.0 x 2.5 cm.  Left ovary is 3.0 x 2.0 x 2.4 cm.  No free pelvic
fluid.
IMPRESSION: 1.  Single living intrauterine embryo corresponding to an age of 8
weeks 3 days by ultrasound.
2.  EDC by ultrasound [DATE].

## 2011-06-19 MED ORDER — SODIUM CHLORIDE 0.9 % IV SOLN: 999.0000 mL | Freq: Once | INTRAVENOUS | Status: DC

## 2011-06-19 MED ORDER — HYDROMORPHONE HCL 1 MG/ML IJ SOLN
1.0000 mg | Freq: Once | INTRAMUSCULAR | Status: AC
  Administered 2011-06-19: 1 mg via INTRAVENOUS
  Filled 2011-06-19: qty 1

## 2011-06-19 MED ORDER — ONDANSETRON HCL 4 MG/2ML IJ SOLN
4.0000 mg | Freq: Once | INTRAMUSCULAR | Status: AC
  Administered 2011-06-19: 4 mg via INTRAVENOUS
  Filled 2011-06-19: qty 2

## 2011-06-19 MED ORDER — HYDROCODONE-ACETAMINOPHEN 5-325 MG PO TABS: 1.0000 | ORAL_TABLET | ORAL | Status: AC | PRN

## 2011-06-19 MED ORDER — LOPERAMIDE HCL 2 MG PO CAPS
4.0000 mg | ORAL_CAPSULE | Freq: Once | ORAL | Status: AC
  Administered 2011-06-19: 4 mg via ORAL
  Filled 2011-06-19: qty 2

## 2011-06-19 MED ORDER — METOCLOPRAMIDE HCL 10 MG PO TABS: 10.0000 mg | ORAL_TABLET | Freq: Four times a day (QID) | ORAL | Status: AC | PRN

## 2011-06-19 MED ORDER — SODIUM CHLORIDE 0.9 % IV BOLUS (SEPSIS)
1000.0000 mL | Freq: Once | INTRAVENOUS | Status: AC
  Administered 2011-06-19: 1000 mL via INTRAVENOUS

## 2011-06-19 MED ORDER — ONDANSETRON 8 MG PO TBDP
8.0000 mg | ORAL_TABLET | Freq: Once | ORAL | Status: AC
  Administered 2011-06-19: 8 mg via ORAL
  Filled 2011-06-19: qty 1

## 2011-06-19 NOTE — ED Notes (Signed)
Patient's vitals taken within normal limits. Also IV DC'D.

## 2011-06-19 NOTE — ED Notes (Signed)
Patient c/o right lower quad pain with nausea and diarrhea x3 days. Patient also reports vomiting x2 days and feeling weak. Per patient has has appendix and gallbladder removed and has had a tubal ligation.

## 2011-06-19 NOTE — ED Notes (Signed)
Pt taken to [place] at this time. NAD

## 2011-06-19 NOTE — ED Notes (Signed)
Pt self ambulated out with a steady gait stating no needs 

## 2011-06-19 NOTE — ED Provider Notes (Signed)
History     CSN: [id number] Arrival date & time: 06/19/2011  6:03 PM  Chief Complaint  Patient presents with  . Abdominal Pain    nausea, vomiting, diarrhea   Patient is a 40 y.o. female presenting with abdominal pain. The history is provided by the patient.  Abdominal Pain The primary symptoms of the illness include abdominal pain, nausea, vomiting and diarrhea. Episode onset: Symptoms started suddenly three days ago, and have been persistent.  The abdominal pain is located in the periumbilical region, RLQ and LLQ. The severity of the abdominal pain is 10/10. The abdominal pain is relieved by bowel movement and vomiting (Reilef is transient). Exacerbated by: Pain is worse wtih palpation.  Vomiting and diarrhea started at the same time. All symptoms have been severe. She has not taken anything at home for her symptoms.  Past Medical History  Diagnosis Date  . Obesity   . Hypertension     Past Surgical History  Procedure Date  . Bilateral breast reduction 2004  . Appendectomy   . Cholecystectomy   . Removal of left ovarian cyst 2003  . Btl 2011    Family History  Problem Relation Age of Onset  . Coronary artery disease Mother   . Hypertension Mother   . Coronary artery disease Father   . Kidney disease Father   . Hypertension Father   . Hypertension Sister     History  Substance Use Topics  . Smoking status: Never Smoker   . Smokeless tobacco: Never Used  . Alcohol Use: No    OB History    Grav Para Term Preterm Abortions TAB SAB Ect Mult Living   4 3  3 1  1   3       Review of Systems  Gastrointestinal: Positive for nausea, vomiting, abdominal pain and diarrhea.  All other systems reviewed and are negative.    Physical Exam  BP 168/120  Pulse 84  Temp(Src) 98.9 F (37.2 C) (Oral)  Resp 20  Ht 5\' 3"  (1.6 m)  Wt 264 lb (119.75 kg)  BMI 46.77 kg/m2  SpO2 98%  LMP 04/16/2011  Physical Exam  Nursing note and vitals reviewed. Constitutional: She is  oriented to person, place, and time. She appears well-developed and well-nourished.       She appears to be in pain.  HENT:  Head: Normocephalic and atraumatic.  Right Ear: External ear normal.  Left Ear: External ear normal.  Mouth/Throat: Oropharynx is clear and moist.  Eyes: Conjunctivae and EOM are normal. Pupils are equal, round, and reactive to light. No scleral icterus.  Neck: Normal range of motion. Neck supple. No JVD present.  Abdominal: Soft. She exhibits no distension and no mass. There is no rebound and no guarding.       There is diffuse tenderness which is slightly worse in the periumbilical area and lower abdomen. Peristalsis is diminished.  Musculoskeletal: Normal range of motion. She exhibits no edema and no tenderness.  Lymphadenopathy:    She has no cervical adenopathy.  Neurological: She is alert and oriented to person, place, and time. She has normal reflexes. No cranial nerve deficit. Coordination abnormal.  Skin: Skin is warm and dry. No rash noted.  Psychiatric: She has a normal mood and affect. Her behavior is normal. Judgment and thought content normal.    ED Course  Procedures Pain improved with IV Dilaudid, nausea improved with IV Zofran, given IV hydration. MDM Pregnancy in woman s/p tubal ligation is ectopic until  proven otherwise. US shows IUP [redacted] weeks gestation. Will need to refer back to her gynecologist, need to observe for possible heterotopic pregnancy.  Labs and x-rays reviewed, images viewed by me.  Results for orders placed during the hospital encounter of 06/19/11  CBC      Component Value Range   WBC 11.9 (*) 4.0 - 10.5 (K/uL)   RBC 4.70  3.87 - 5.11 (MIL/uL)   Hemoglobin 14.2  12.0 - 15.0 (g/dL)   HCT 78.2  95.6 - 21.3 (%)   MCV 89.6  78.0 - 100.0 (fL)   MCH 30.2  26.0 - 34.0 (pg)   MCHC 33.7  30.0 - 36.0 (g/dL)   RDW 08.6  57.8 - 46.9 (%)   Platelets 273  150 - 400 (K/uL)  DIFFERENTIAL      Component Value Range   Neutrophils Relative  71  43 - 77 (%)   Neutro Abs 8.5 (*) 1.7 - 7.7 (K/uL)   Lymphocytes Relative 22  12 - 46 (%)   Lymphs Abs 2.6  0.7 - 4.0 (K/uL)   Monocytes Relative 6  3 - 12 (%)   Monocytes Absolute 0.8  0.1 - 1.0 (K/uL)   Eosinophils Relative 0  0 - 5 (%)   Eosinophils Absolute 0.0  0.0 - 0.7 (K/uL)   Basophils Relative 0  0 - 1 (%)   Basophils Absolute 0.0  0.0 - 0.1 (K/uL)  COMPREHENSIVE METABOLIC PANEL      Component Value Range   Sodium 134 (*) 135 - 145 (mEq/L)   Potassium 3.5  3.5 - 5.1 (mEq/L)   Chloride 98  96 - 112 (mEq/L)   CO2 24  19 - 32 (mEq/L)   Glucose, Bld 87  70 - 99 (mg/dL)   BUN 12  6 - 23 (mg/dL)   Creatinine, Ser 6.29  0.50 - 1.10 (mg/dL)   Calcium 9.1  8.4 - 52.8 (mg/dL)   Total Protein 7.5  6.0 - 8.3 (g/dL)   Albumin 3.5  3.5 - 5.2 (g/dL)   AST 13  0 - 37 (U/L)   ALT 12  0 - 35 (U/L)   Alkaline Phosphatase 111  39 - 117 (U/L)   Total Bilirubin 0.9  0.3 - 1.2 (mg/dL)   GFR calc non Af Amer >60  >60 (mL/min)   GFR calc Af Amer >60  >60 (mL/min)  URINALYSIS, ROUTINE W REFLEX MICROSCOPIC      Component Value Range   Color, Urine AMBER (*) YELLOW    Appearance HAZY (*) CLEAR    Specific Gravity, Urine >1.030 (*) 1.005 - 1.030    pH 6.0  5.0 - 8.0    Glucose, UA NEGATIVE  NEGATIVE (mg/dL)   Hgb urine dipstick NEGATIVE  NEGATIVE    Bilirubin Urine NEGATIVE  NEGATIVE    Ketones, ur NEGATIVE  NEGATIVE (mg/dL)   Protein, ur NEGATIVE  NEGATIVE (mg/dL)   Urobilinogen, UA 0.2  0.0 - 1.0 (mg/dL)   Nitrite NEGATIVE  NEGATIVE    Leukocytes, UA NEGATIVE  NEGATIVE   POCT PREGNANCY, URINE      Component Value Range   Preg Test, Ur POSITIVE    HCG, QUANTITATIVE, PREGNANCY      Component Value Range   hCG, Beta Chain, Quant, S 41324 (*) <5 (mIU/mL)   US Transvaginal Non-ob  06/19/2011  *RADIOLOGY REPORT*  Clinical Data:  Right lower quadrant pain.  By LMP, the patient is 9 weeks 1 day.  EDC by LMP 01/21/2012  OBSTETRIC <14 WK [place] AND TRANSVAGINAL OB US  Technique:  Both  transabdominal and transvaginal ultrasound examinations were performed for complete evaluation of the gestation as well as the maternal uterus, adnexal regions, and pelvic cul-de-sac.  Transvaginal technique was performed to assess early pregnancy.  Comparison:  None  Intrauterine gestational sac:  Present Yolk sac: Present Embryo: Present Cardiac Activity: Present Heart Rate: 155 bpm  CRL: 1.9 centimeters   8   w  3   d             [place] EDC: 01/21/2012  Maternal uterus/adnexae: No subchorionic hemorrhage identified.  The right ovary is 4.3 x 3.0 x 2.5 cm.  Left ovary is 3.0 x 2.0 x 2.4 cm.  No free pelvic fluid.  IMPRESSION:  1.  Single living intrauterine embryo corresponding to an age of 8 weeks 3 days by ultrasound. 2.  EDC by ultrasound 01/26/2012.  Original Report Authenticated By: Patterson Hammersmith, M.D.   US Pelvis Complete  06/19/2011  *RADIOLOGY REPORT*  Clinical Data:  Right lower quadrant pain.  By LMP, the patient is 9 weeks 1 day.  EDC by LMP 01/21/2012  OBSTETRIC <14 WK [place] AND TRANSVAGINAL OB US  Technique:  Both transabdominal and transvaginal ultrasound examinations were performed for complete evaluation of the gestation as well as the maternal uterus, adnexal regions, and pelvic cul-de-sac.  Transvaginal technique was performed to assess early pregnancy.  Comparison:  None  Intrauterine gestational sac:  Present Yolk sac: Present Embryo: Present Cardiac Activity: Present Heart Rate: 155 bpm  CRL: 1.9 centimeters   8   w  3   d             [place] EDC: 01/21/2012  Maternal uterus/adnexae: No subchorionic hemorrhage identified.  The right ovary is 4.3 x 3.0 x 2.5 cm.  Left ovary is 3.0 x 2.0 x 2.4 cm.  No free pelvic fluid.  IMPRESSION:  1.  Single living intrauterine embryo corresponding to an age of 8 weeks 3 days by ultrasound. 2.  EDC by ultrasound 01/26/2012.  Original Report Authenticated By: Patterson Hammersmith, M.D.       Dione Booze, MD 06/19/11 2132

## 2011-06-20 ENCOUNTER — Telehealth: Payer: Self-pay | Admitting: Family Medicine

## 2011-06-21 NOTE — Telephone Encounter (Signed)
noted 

## 2011-10-14 NOTE — L&D Delivery Note (Signed)
Delivery Note At 7:48 AM a viable female was delivered via Vaginal, Spontaneous Delivery (Presentation: ; Occiput Anterior).  APGAR: , ; weight .   Placenta status: Abnormal, Spontaneous.  Cord: 3 vessels with the following complications: .  Cord pH: n/a  Anesthesia: Epidural  Episiotomy: none Lacerations: none Suture Repair: n/a Est. Blood Loss (mL):   Mom to postpartum.  Baby to NICU.  [name] C. 12/16/2011, 8:02 AM

## 2011-10-21 ENCOUNTER — Emergency Department (HOSPITAL_COMMUNITY)
Admission: EM | Admit: 2011-10-21 | Discharge: 2011-10-22 | Disposition: A | Discharge status: Home / Self Care | Payer: Medicaid Other | Attending: Emergency Medicine | Admitting: Emergency Medicine

## 2011-10-21 ENCOUNTER — Encounter (HOSPITAL_COMMUNITY): Payer: Self-pay | Admitting: *Deleted

## 2011-10-21 DIAGNOSIS — O4700 False labor before 37 completed weeks of gestation, unspecified trimester: Secondary | ICD-10-CM | POA: Insufficient documentation

## 2011-10-21 DIAGNOSIS — O2690 Pregnancy related conditions, unspecified, unspecified trimester: Secondary | ICD-10-CM

## 2011-10-21 DIAGNOSIS — O169 Unspecified maternal hypertension, unspecified trimester: Secondary | ICD-10-CM | POA: Insufficient documentation

## 2011-10-21 NOTE — ED Notes (Signed)
Call to Ron RN assigned to pt, reassuring tracing, no UCs noted but pt is obese. Recommend EDP to call GYN OB on call, Dr Despina Hidden, for further plan

## 2011-10-21 NOTE — ED Notes (Signed)
Contractions, no bleeding.

## 2011-10-21 NOTE — ED Notes (Signed)
Pt states started having contractions about 4 hours ago. Pt tried resting pain contractions continued. Pt states about 10 min apart, pt states feels like she is having some still at this time. 27 weeks at this time.

## 2011-10-21 NOTE — ED Provider Notes (Signed)
This chart was scribed for Flint Melter, MD by Williemae Natter. The patient was seen in room [facility] at 10:00 PM.  CSN: [id number]  Arrival date & time 10/21/11  2115   First MD Initiated Contact with Patient 10/21/11 2206      Chief Complaint  Patient presents with  . Contractions    (Consider location/radiation/quality/duration/timing/severity/associated sxs/prior treatment) HPI [name] is a 41 y.o. female who presents to the Emergency Department complaining of acute onset contractions. Pt is [redacted] weeks pregnant and this is her 5th pregnancy. Pt had a tubal ligation after her last pregnancy in January 2011. Is taking shots to strengthen baby. Pt's contractions are 10 min apart. Was in labor for 3 hours for the last pregnancy. No dysuria. Pt reported some numbness of legs.  Past Medical History  Diagnosis Date  . Obesity   . Hypertension     Past Surgical History  Procedure Date  . Bilateral breast reduction 2004  . Appendectomy   . Cholecystectomy   . Removal of left ovarian cyst 2003  . Btl 2011    Family History  Problem Relation Age of Onset  . Coronary artery disease Mother   . Hypertension Mother   . Coronary artery disease Father   . Kidney disease Father   . Hypertension Father   . Hypertension Sister     History  Substance Use Topics  . Smoking status: Never Smoker   . Smokeless tobacco: Never Used  . Alcohol Use: No    OB History    Grav Para Term Preterm Abortions TAB SAB Ect Mult Living   6 3  3 1  1   3       Review of Systems 10 Systems reviewed and are negative for acute change except as noted in the HPI.  Allergies  Review of patient's allergies indicates no known allergies.  Home Medications   Current Outpatient Rx  Name Route Sig Dispense Refill  . IBUPROFEN 800 MG PO TABS Oral Take 800 mg by mouth as needed. For pain     . LABETALOL HCL 200 MG PO TABS Oral Take 400 mg by mouth 3 (three) times daily.      . METHYLDOPA 500 MG  PO TABS Oral Take 1,000 mg by mouth 2 (two) times daily.      . ACETAMINOPHEN 500 MG PO TABS Oral Take 1,000 mg by mouth as needed. For abdominal pain       BP 134/84  Pulse 91  Temp(Src) 98 F (36.7 C) (Oral)  Resp 21  SpO2 97%  LMP 04/15/2011  Physical Exam  Nursing note reviewed. Constitutional: She is oriented to person, place, and time. She appears well-developed and well-nourished.  HENT:  Head: Normocephalic and atraumatic.  Eyes: Conjunctivae and EOM are normal. Pupils are equal, round, and reactive to light.  Neck: Normal range of motion. Neck supple.  Abdominal: Soft. There is no tenderness.  Genitourinary:       Uterus is palpable above the umbilicus  Neurological: She is alert and oriented to person, place, and time.  Skin: Skin is warm and dry.  Psychiatric: She has a normal mood and affect. Her behavior is normal.   EVAL BY JVFERGUSON;  Patient interviewed, hx updated.  Unable to access prenatal record due to scheduled EPIC downtime.  41 yr female [id number] , with most recent delivery preterm at 30 wks, with prenatal care notable for 17-0H progesterone weekly injections to reduce preterm del risk, and  known small uterine fibroids.  Pt had 3 episodes of sharp abd pain interpreted by pt as contractions, no bleeding or srom. Pt confirms that contractions have resolved.   ED Course  Procedures (including critical care time)Over 1 hour of fetal monitoring reviewed. FHR appropirate for gest age, and Joseph Art shows no contractions. Gravid nontender uterus palpated, no discomfort. Exam limited by Pt obesity. Speculum check:  Physiologic d.c, FFN collected. Digital exam; cervix long closed posterior.  Assess;  No evidence of PTL at present.  Will follow up FFN thru office tomorrow . Pt has appt tomorrow for weekly 17-p injection. Pt aware that in future contractions to be evaluated thru [facility].  Labs Reviewed - No data to display No results found.   1.  Pregnancy complication       MDM  Second trimester pregnancy, with contractions, improved, in the emergency department without evidence for labor or ruptured membranes        Flint Melter, MD 10/22/11 [phone]

## 2011-10-21 NOTE — ED Notes (Signed)
Call from [facility], pt presents with UCs, placed into [facility], pt is a [facility] pt.

## 2011-10-21 NOTE — ED Notes (Signed)
edp advises dr Pierce Crane is going to come in & do a spectulm exam.

## 2011-10-21 NOTE — ED Notes (Signed)
Call returned from [facility], states tracing is ok & to call obgyn on call. edp notified.

## 2011-10-21 NOTE — ED Notes (Signed)
Pt placed on fetal monitor & [facility]

## 2011-10-21 NOTE — ED Notes (Signed)
Advised unable to reach dr Despina Hidden. Was advised to contact dr furgurson. edp notified.

## 2011-10-22 LAB — FETAL FIBRONECTIN: Fetal Fibronectin: NEGATIVE

## 2011-10-22 NOTE — ED Notes (Signed)
System downtime charting

## 2011-10-22 NOTE — ED Notes (Signed)
Pt alert & oriented x4, stable gait. Pt given discharge instructions, paperwork & prescription(s), pt verbalized understanding. Pt left department w/ no further questions.  

## 2011-12-05 ENCOUNTER — Inpatient Hospital Stay (HOSPITAL_COMMUNITY)
Admission: AD | Admit: 2011-12-05 | Discharge: 2011-12-07 | DRG: 781 | Disposition: A | Discharge status: Home / Self Care | Payer: Medicaid Other | Source: Ambulatory Visit | Attending: Obstetrics & Gynecology | Admitting: Obstetrics & Gynecology

## 2011-12-05 ENCOUNTER — Other Ambulatory Visit: Payer: Self-pay | Admitting: Obstetrics & Gynecology

## 2011-12-05 ENCOUNTER — Inpatient Hospital Stay (HOSPITAL_COMMUNITY): Payer: Medicaid Other

## 2011-12-05 DIAGNOSIS — O09529 Supervision of elderly multigravida, unspecified trimester: Secondary | ICD-10-CM | POA: Diagnosis present

## 2011-12-05 DIAGNOSIS — O10019 Pre-existing essential hypertension complicating pregnancy, unspecified trimester: Principal | ICD-10-CM | POA: Diagnosis present

## 2011-12-05 DIAGNOSIS — O169 Unspecified maternal hypertension, unspecified trimester: Secondary | ICD-10-CM

## 2011-12-05 LAB — CBC
HCT: 37.5 % (ref 36.0–46.0)
Hemoglobin: 12.6 g/dL (ref 12.0–15.0)
MCH: 31.5 pg (ref 26.0–34.0)
MCHC: 33.6 g/dL (ref 30.0–36.0)
MCV: 93.8 fL (ref 78.0–100.0)
Platelets: 226 10*3/uL (ref 150–400)
RBC: 4 MIL/uL (ref 3.87–5.11)
RDW: 13.6 % (ref 11.5–15.5)
WBC: 15 10*3/uL — ABNORMAL HIGH (ref 4.0–10.5)

## 2011-12-05 LAB — COMPREHENSIVE METABOLIC PANEL
ALT: 16 U/L (ref 0–35)
AST: 18 U/L (ref 0–37)
Albumin: 2.7 g/dL — ABNORMAL LOW (ref 3.5–5.2)
Alkaline Phosphatase: 89 U/L (ref 39–117)
BUN: 12 mg/dL (ref 6–23)
CO2: 21 mEq/L (ref 19–32)
Calcium: 8.9 mg/dL (ref 8.4–10.5)
Chloride: 102 mEq/L (ref 96–112)
Creatinine, Ser: 0.59 mg/dL (ref 0.50–1.10)
GFR calc Af Amer: >90 mL/min (ref >90)
GFR calc non Af Amer: >90 mL/min (ref >90)
Glucose, Bld: 123 mg/dL — ABNORMAL HIGH (ref 70–99)
Potassium: 4.6 mEq/L (ref 3.5–5.1)
Sodium: 133 mEq/L — ABNORMAL LOW (ref 135–145)
Total Bilirubin: 0.6 mg/dL (ref 0.3–1.2)
Total Protein: 6.4 g/dL (ref 6.0–8.3)

## 2011-12-05 LAB — STREP B DNA PROBE: GBS: NEGATIVE

## 2011-12-05 LAB — GC/CHLAMYDIA PROBE AMP, GENITAL
Chlamydia: NEGATIVE
Gonorrhea: NEGATIVE

## 2011-12-05 LAB — RPR: RPR: NONREACTIVE

## 2011-12-05 LAB — HEPATITIS B SURFACE ANTIGEN: Hepatitis B Surface Ag: NEGATIVE

## 2011-12-05 LAB — HIV ANTIBODY (ROUTINE TESTING W REFLEX): HIV: NONREACTIVE

## 2011-12-05 LAB — PROTEIN / CREATININE RATIO, URINE
Creatinine, Urine: 94.31 mg/dL
Protein Creatinine Ratio: 0.16 — ABNORMAL HIGH (ref 0.00–0.15)
Total Protein, Urine: 14.9 mg/dL

## 2011-12-05 LAB — RUBELLA ANTIBODY, IGM: Rubella: IMMUNE

## 2011-12-05 IMAGING — US US OB COMP +14 WK
1 series · 12 of 28 positions shown · non-contrast
Comparison: none

[Series 1: us ob follow up · 47 acquisitions, 12 frames shown]
[im 2/47]
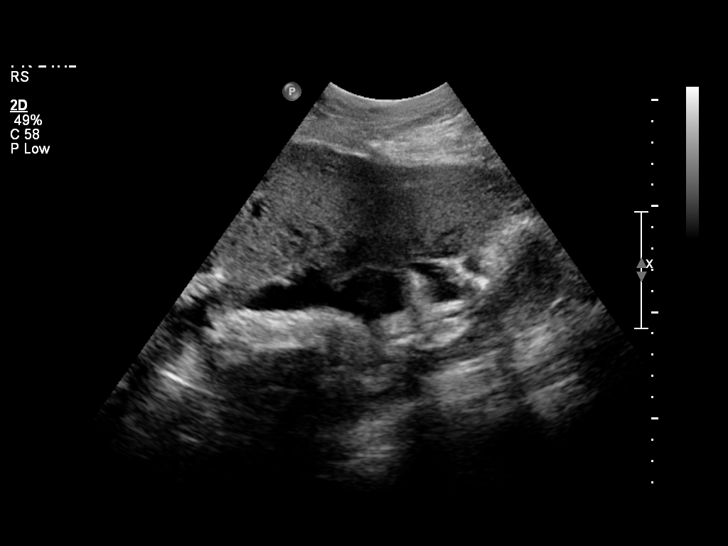
[im 6/47]
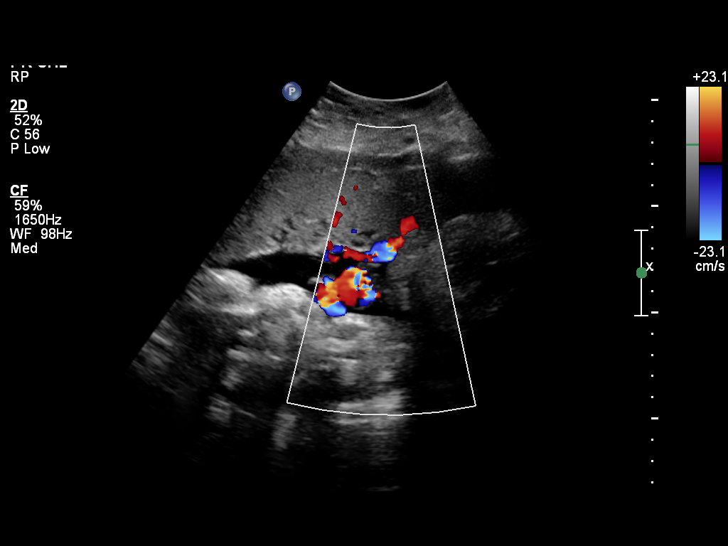
[im 9/47]
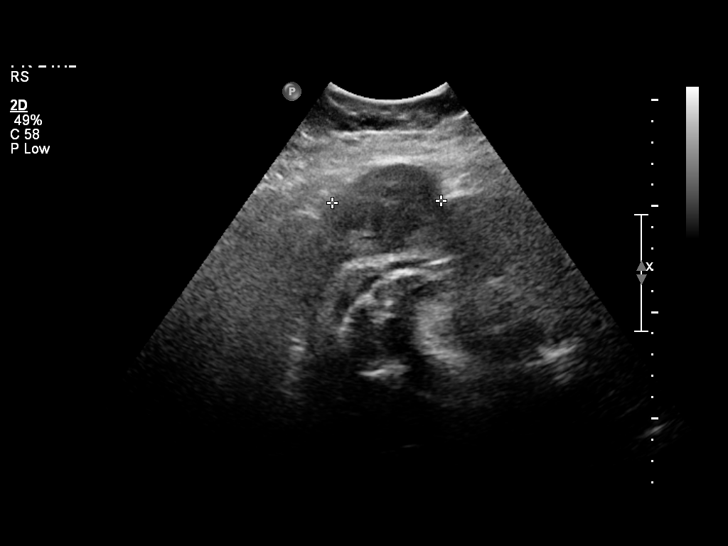
[im 14/47]
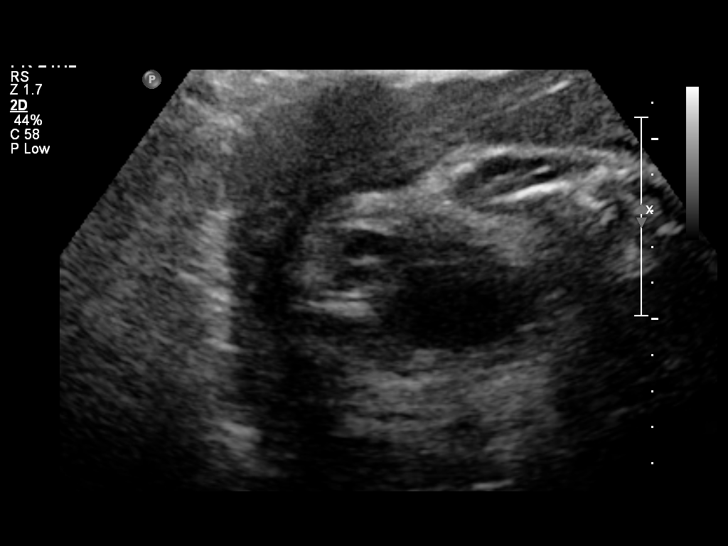
[im 18/47]
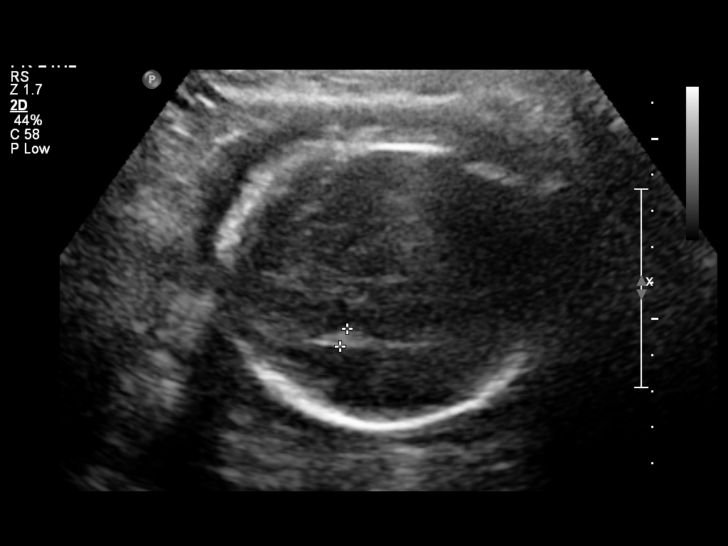
[im 21/47]
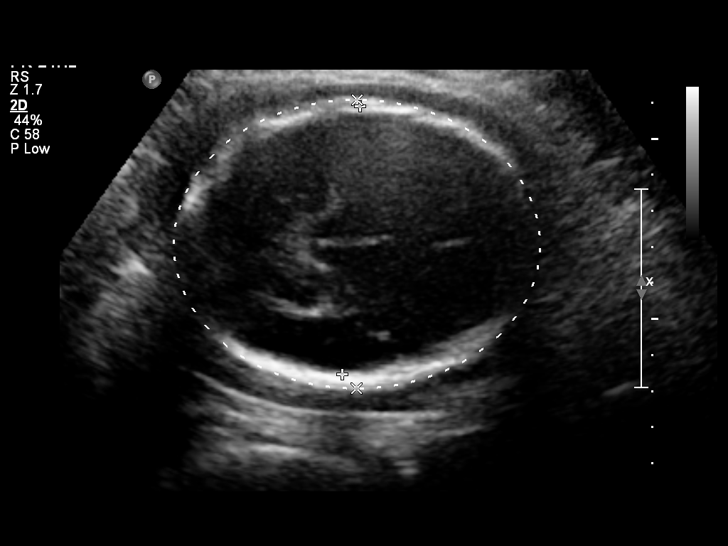
[im 26/47]
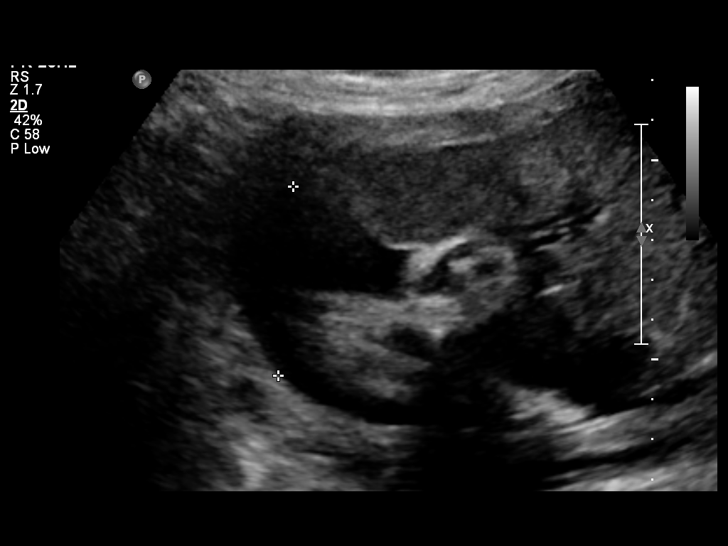
[im 29/47]
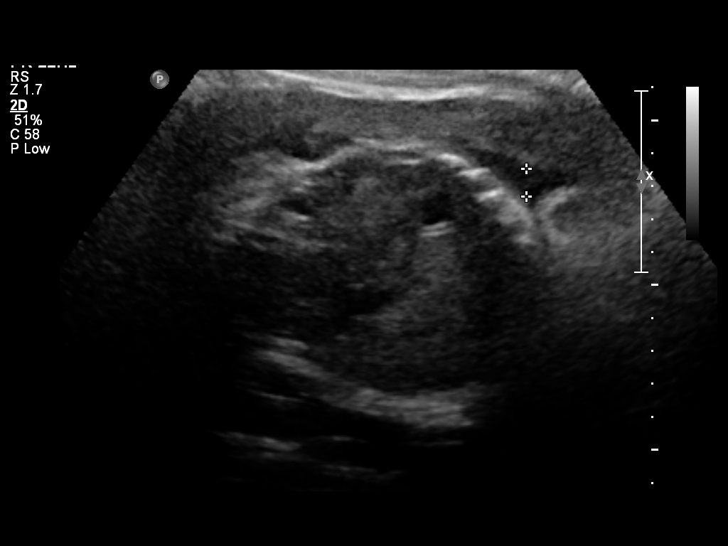
[im 33/47]
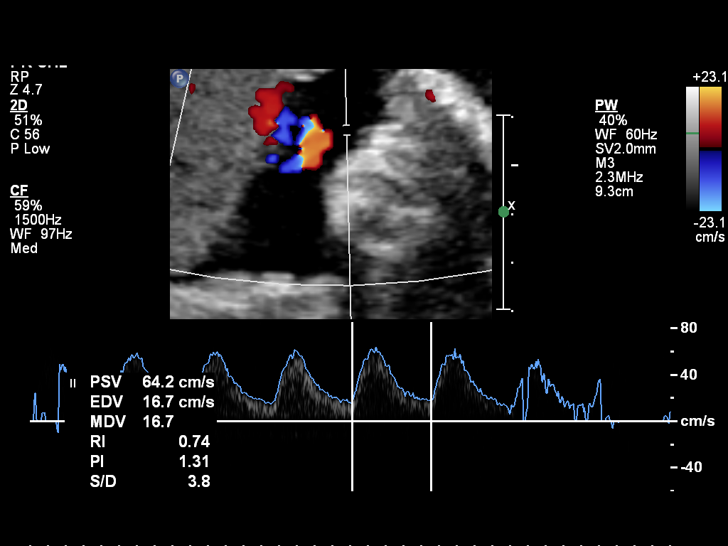
[im 38/47]
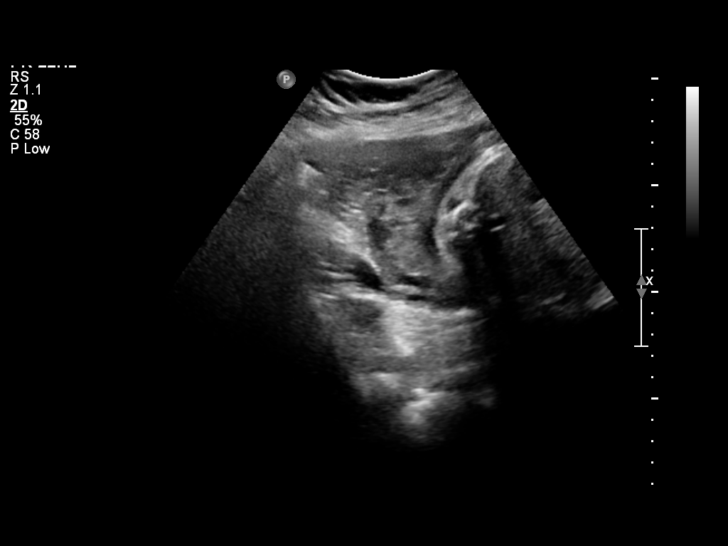
[im 41/47]
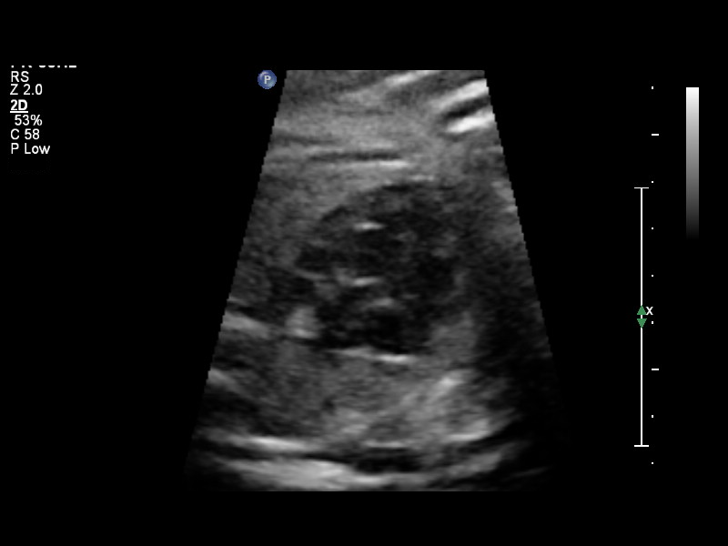
[im 45/47]
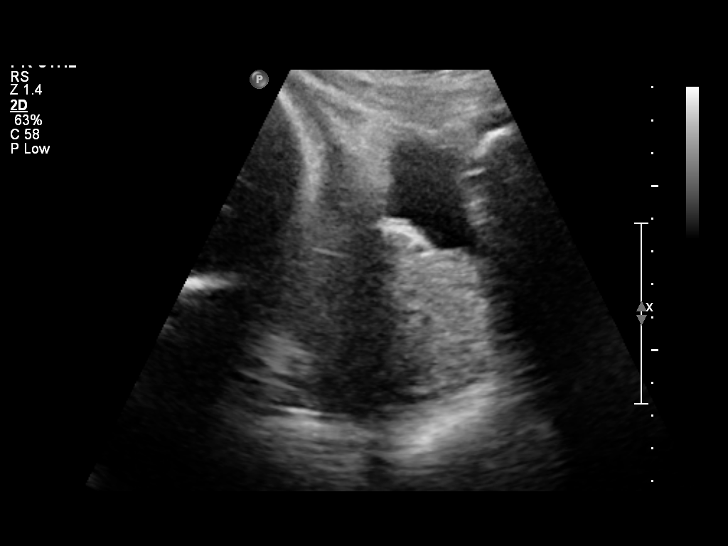

[12 of 28 positions shown; findings below may reference images not displayed]

OBSTETRICS REPORT
                      (Signed Final [DATE] [DATE])

 Order#:         [PHONE_NUMBER]_I,[PHONE_NUMBER]
                 6_I
Procedures

 US OB COMP + 14 WK                                    76805.1
 US UA CORD DOPPLER                                    76827.2
Indications

 Hypertension - Gestational
 Obesity
 Poor obstetric history: Previous preterm delivery     [XO]
 Poor obstetric history (prior pre-term labor)         [XO]
Fetal Evaluation

 Fetal Heart Rate:  150                         bpm
 Cardiac Activity:  Observed
 Presentation:      Cephalic
 Placenta:          Anterior, above cervical os
 P. Cord            Visualized
 Insertion:

 Amniotic Fluid
 AFI FV:      Subjectively low-normal
 AFI Sum:     9.35    cm      13   %Tile     Larg Pckt:   4.74   cm
 RUQ:   0.84   cm    RLQ:    4.74   cm    LUQ:   2.93    cm   LLQ:    0.84   cm
Biometry

 BPD:     74.1  mm    G. Age:   29w 5d                CI:        68.69   70 - 86
                                                      FL/HC:      21.0   19.9 -

 HC:     285.7  mm    G. Age:   31w 3d      < 3  %    HC/AC:      1.01   0.96 -

 AC:     284.2  mm    G. Age:   32w 3d       31  %    FL/BPD:     81.0   71 - 87
 FL:        60  mm    G. Age:   31w 1d        5  %    FL/AC:      21.1   20 - 24
 Est. FW:    [XO]  gm          4 lb      32  %
Gestational Age

 Clinical EDD:  33w 1d                                        EDD:   [DATE]
 U/S Today:     31w 1d                                        EDD:   [DATE]
 Best:          33w 1d    Det. By:   Clinical EDD             EDD:   [DATE]
Anatomy

 Cranium:           Appears normal      Aortic Arch:       Not well
                                                           visualized
 Fetal Cavum:       Appears normal      Ductal Arch:       Not well
                                                           visualized
 Ventricles:        Appears normal      Diaphragm:         Appears normal
 Choroid Plexus:    Not well            Stomach:           Appears
                    visualized                             normal, left
                                                           sided
 Cerebellum:        Not well            Abdomen:           Appears normal
                    visualized
 Posterior Fossa:   Not well            Abdominal Wall:    Not well
                    visualized                             visualized
 Nuchal Fold:       Not applicable      Cord Vessels:      Appears normal
                    (>20 wks GA)                           (3 vessel cord)
 Face:              Lips appear         Kidneys:           Appear normal
                    normal
 Heart:             Not well            Bladder:           Appears normal
                    visualized
 RVOT:              Not well            Spine:             Not well
                    visualized                             visualized
 LVOT:              Not well            Limbs:             Not well
                    visualized                             visualized

 Other:     Technically difficult due to advanced GA. Technically
            difficult due to maternal habitus and fetal position.
Doppler - Fetal Vessels

 Umbilical Artery
 S/D:   4              97  %tile       RI:
 PI:    1.58                           PSV:       64      cm/s
 Umbilical Artery
 Absent DFV:    No     Reverse DFV:    No

Cervix Uterus Adnexa

 Cervical Length:   5.73      cm

 Cervix:       Normal appearance by transabdominal scan.

 Adnexa:     No abnormality visualized.
Myomas

 Site                     L(cm)      W(cm)      D(cm)       Location
 Fundus                   6.5        5.1        3.5         Pedunculated
 Blood Flow                  RI       PI       Comments

Impression

 Single intrauterine gestation demonstrating an estimated
 gestational age by ultrasound of 31w 1d. This is correlated
 with expected estimated gestational age by clinical EDD of
 33w 1d. EFW is currently at the 32%.

 Anatomic evaluation is compromised by advanced
 gestational age and maternal habitus. Visualized anatomy
 appears normal with no late developing fetal anatomic
 abnormalities are noted associated with the lateral ventricles,
 stomach, kidneys or bladder.

 Subjectively and quantitatively low normal amniotic fluid
 volume with an AFI at the 13%.

 Normal cervical length and appearance.

 U/A S/D ratio at the 97% for expected EGA. No absence or
 reversal of end diastolic flow noted.

 questions or concerns.

## 2011-12-05 MED ORDER — PRENATAL MULTIVITAMIN CH
1.0000 | ORAL_TABLET | Freq: Every day | ORAL | Status: DC
  Administered 2011-12-05 – 2011-12-07 (×3): 1 via ORAL
  Filled 2011-12-05 (×3): qty 1

## 2011-12-05 MED ORDER — ZOLPIDEM TARTRATE 10 MG PO TABS: 10.0000 mg | ORAL_TABLET | Freq: Every evening | ORAL | Status: DC | PRN

## 2011-12-05 MED ORDER — HYDRALAZINE HCL 20 MG/ML IJ SOLN
10.0000 mg | INTRAMUSCULAR | Status: DC | PRN
  Administered 2011-12-05: 10 mg via INTRAVENOUS
  Filled 2011-12-05: qty 1

## 2011-12-05 MED ORDER — ACETAMINOPHEN 325 MG PO TABS
650.0000 mg | ORAL_TABLET | ORAL | Status: DC | PRN
  Administered 2011-12-05: 650 mg via ORAL
  Filled 2011-12-05: qty 2

## 2011-12-05 MED ORDER — CALCIUM CARBONATE ANTACID 500 MG PO CHEW: 2.0000 | CHEWABLE_TABLET | ORAL | Status: DC | PRN

## 2011-12-05 MED ORDER — SODIUM CHLORIDE 0.9 % IJ SOLN
3.0000 mL | Freq: Two times a day (BID) | INTRAMUSCULAR | Status: DC
  Administered 2011-12-05 – 2011-12-06 (×3): 3 mL via INTRAVENOUS

## 2011-12-05 MED ORDER — METHYLDOPA 500 MG PO TABS
1000.0000 mg | ORAL_TABLET | Freq: Two times a day (BID) | ORAL | Status: DC
  Administered 2011-12-05 – 2011-12-07 (×4): 1000 mg via ORAL
  Filled 2011-12-05 (×4): qty 2

## 2011-12-05 MED ORDER — LABETALOL HCL 300 MG PO TABS
600.0000 mg | ORAL_TABLET | Freq: Three times a day (TID) | ORAL | Status: DC
  Administered 2011-12-05 – 2011-12-07 (×6): 600 mg via ORAL
  Filled 2011-12-05 (×6): qty 2

## 2011-12-05 MED ORDER — DOCUSATE SODIUM 100 MG PO CAPS
100.0000 mg | ORAL_CAPSULE | Freq: Every day | ORAL | Status: DC
  Administered 2011-12-05 – 2011-12-07 (×3): 100 mg via ORAL
  Filled 2011-12-05 (×3): qty 1

## 2011-12-05 NOTE — Progress Notes (Signed)
Dr Debroah Loop notified of FHR tracing with minimal variability, H/A, maternal BPs, and pain level. Orders and POC being placed.

## 2011-12-06 NOTE — Progress Notes (Signed)
Patient ID: [name], female   DOB: [birth year], 41 y.o.   MRN: [id number] FACULTY PRACTICE ANTEPARTUM(COMPREHENSIVE) NOTE  [name] is a 41 y.o. [id number] at [redacted]w[redacted]d by LMP, early ultrasound who is admitted for worsening blood pressures.   Fetal presentation is cephalic. Length of Stay:  1  Days  Subjective: Dull headache Patient reports the fetal movement as active. Patient reports uterine contraction  activity as none. Patient reports  vaginal bleeding as none. Patient describes fluid per vagina as None.  Vitals:  Blood pressure 137/94, pulse 97, temperature 98 F (36.7 C), temperature source Oral, resp. rate 19, height 5\' 3"  (1.6 m), weight 286 lb (129.729 kg), last menstrual period 04/15/2011.    Physical Examination:  General appearance - alert, well appearing, and in no distress Abdomen - soft gravid non tender Extremities - peripheral pulses normal, no pedal edema, no clubbing or cyanosis, pedal edema 2 + Fetal Monitoring:  Baseline: 140s bpm, Variability: Good {> 6 bpm) and Accelerations: Reactive  Labs:  Recent Results (from the past 24 hour(s))  CBC   Collection Time   12/05/11  5:50 PM      Component Value Range   WBC 15.0 (*) 4.0 - 10.5 (K/uL)   RBC 4.00  3.87 - 5.11 (MIL/uL)   Hemoglobin 12.6  12.0 - 15.0 (g/dL)   HCT 95.6  21.3 - 08.6 (%)   MCV 93.8  78.0 - 100.0 (fL)   MCH 31.5  26.0 - 34.0 (pg)   MCHC 33.6  30.0 - 36.0 (g/dL)   RDW 57.8  46.9 - 62.9 (%)   Platelets 226  150 - 400 (K/uL)  COMPREHENSIVE METABOLIC PANEL   Collection Time   12/05/11  5:50 PM      Component Value Range   Sodium 133 (*) 135 - 145 (mEq/L)   Potassium 4.6  3.5 - 5.1 (mEq/L)   Chloride 102  96 - 112 (mEq/L)   CO2 21  19 - 32 (mEq/L)   Glucose, Bld 123 (*) 70 - 99 (mg/dL)   BUN 12  6 - 23 (mg/dL)   Creatinine, Ser 5.28  0.50 - 1.10 (mg/dL)   Calcium 8.9  8.4 - 41.3 (mg/dL)   Total Protein 6.4  6.0 - 8.3 (g/dL)   Albumin 2.7 (*) 3.5 - 5.2 (g/dL)   AST 18  0 - 37 (U/L)   ALT  16  0 - 35 (U/L)   Alkaline Phosphatase 89  39 - 117 (U/L)   Total Bilirubin 0.6  0.3 - 1.2 (mg/dL)   GFR calc non Af Amer >90  >90 (mL/min)   GFR calc Af Amer >90  >90 (mL/min)  PROTEIN / CREATININE RATIO, URINE   Collection Time   12/05/11  8:00 PM      Component Value Range   Creatinine, Urine 94.31     Total Protein, Urine 14.9     PROTEIN CREATININE RATIO 0.16 (*) 0.00 - 0.15     Imaging Studies:    Growth 32%tile, AFI 9.2, RI .75   Medications:  Scheduled    . docusate sodium  100 mg Oral Daily  . labetalol  600 mg Oral TID  . methyldopa  1,000 mg Oral BID  . prenatal multivitamin  1 tablet Oral Daily  . sodium chloride  3 mL Intravenous Q12H   I have reviewed the patient's current medications.  ASSESSMENT: 1.  IUP at 33 2/[redacted] weeks gestation 2.  Chronic hypertension, worsening  PLAN: BP  a bit better here than in office.  JUst increased her Labetolol on 2/21.  Received betamethasone x 2 in office.  No evidence of superimposed pre eclampsia at this point.  Dopplers elevated but acceptable.  Arun Herrod H 12/06/2011,7:49 AM

## 2011-12-06 NOTE — Progress Notes (Signed)
[name] CNM asked to review tracing.

## 2011-12-06 NOTE — H&P (Signed)
[name] is a 41 y.o. female at 78 1/[redacted] weeks gestation with EDD of   by early sonogram with significant chronic hypertension.  She has been maintained on aldomet 1000 mg BID and labetalol 400 mg TID.  [name] presented to the office yesterday with worsening BP, 170s/100s, at the time of her visit.   I increased her  Labetalol to 600  Mg TID, gave her first dose of betamethasone, with second today.  Sonogram revealed EfW 23%, AFI 6.7 and S/D ratio of .85 with present but marginal ED flow.  Today in the office her BP was still too elevated for outpatient management. All her labs are normal with Pr/Cr ratio of .1 and normal LFTs.  [name] is admitted for in house evaluation and management.  History OB History    Grav Para Term Preterm Abortions TAB SAB Ect Mult Living   6 3  3 1  1   3      Past Medical History  Diagnosis Date  . Obesity   . Hypertension    Past Surgical History  Procedure Date  . Bilateral breast reduction 2004  . Appendectomy   . Cholecystectomy   . Removal of left ovarian cyst 2003  . Btl 2011   Family History: family history includes Coronary artery disease in her father and mother; Hypertension in her father, mother, and sister; and Kidney disease in her father. Social History:  reports that she has never smoked. She has never used smokeless tobacco. She reports that she does not drink alcohol or use illicit drugs.  ROS  Review of Systems  Constitutional: Negative for fever, chills, weight loss, malaise/fatigue and diaphoresis.  HENT: Negative for hearing loss, ear pain, nosebleeds, congestion, sore throat, neck pain, tinnitus and ear discharge.  patient does have a headache. Eyes: Negative for blurred vision, double vision, photophobia, pain, discharge and redness.  Respiratory: Negative for cough, hemoptysis, sputum production, shortness of breath, wheezing and stridor.   Cardiovascular: Negative for chest pain, palpitations, orthopnea, claudication, leg swelling and  PND.  Gastrointestinal:  Negative for abdominal pain. Negative for heartburn, nausea, vomiting, diarrhea, constipation, blood in stool and melena.  Genitourinary: Negative for dysuria, urgency, frequency, hematuria and flank pain.  Musculoskeletal: Negative for myalgias, back pain, joint pain and falls.  Skin: Negative for itching and rash.  Neurological: Negative for dizziness, tingling, tremors, sensory change, speech change, focal weakness, seizures, loss of consciousness, weakness and headaches.  Endo/Heme/Allergies: Negative for environmental allergies and polydipsia. Does not bruise/bleed easily.  Psychiatric/Behavioral: Negative for depression, suicidal ideas, hallucinations, memory loss and substance abuse. The patient is not nervous/anxious and does not have insomnia.        Blood pressure 127/69, pulse 93, temperature 98.3 F (36.8 C), temperature source Oral, resp. rate 20, height 5\' 3"  (1.6 m), weight 286 lb (129.729 kg), last menstrual period 04/15/2011. Exam Physical Exam  Physical Exam  Vitals reviewed. Constitutional: She is oriented to person, place, and time. She appears well-developed and well-nourished.  HENT:  Head: Normocephalic and atraumatic.  Right Ear: External ear normal.  Left Ear: External ear normal.  Nose: Nose normal.  Mouth/Throat: Oropharynx is clear and moist.  Eyes: Conjunctivae and EOM are normal. Pupils are equal, round, and reactive to light. Right eye exhibits no discharge. Left eye exhibits no discharge. No scleral icterus.  Neck: Normal range of motion. Neck supple. No tracheal deviation present. No thyromegaly present.  Cardiovascular: Normal rate, regular rhythm, normal heart sounds and intact distal pulses.  Exam reveals no gallop and no friction rub.   No murmur heard. Respiratory: Effort normal and breath sounds normal. No respiratory distress. She has no wheezes. She has no rales. She exhibits no tenderness.  GI: Soft. Bowel sounds are  normal. She exhibits no distension and no mass. There is no tenderness. There is no rebound and no guarding.  Genitourinary:       Vulva is normal without lesions Vagina is pink moist without discharge Cervix normal in appearance and pap is normal, multiparous Uterus is enlarged consistent with gestational age Adnexa is negative with normal sized ovaries by sonogram in the first trimester Musculoskeletal: Normal range of motion. She exhibits no edema and no tenderness.  Neurological: She is alert and oriented to person, place, and time. She has normal reflexes. She displays normal reflexes. No cranial nerve deficit. She exhibits normal muscle tone. Coordination normal.  Skin: Skin is warm and dry. No rash noted. No erythema. No pallor.  Psychiatric: She has a normal mood and affect. Her behavior is normal. Judgment and thought content normal.  Extremeties:  2+ edema normal reflexes   Prenatal labs: ABO, Rh: --/--/O POS (09/06 2045) Antibody: NEG (09/06 2045) Rubella: Immune (02/22 0000) RPR: Nonreactive (02/22 0000)  HBsAg: Negative (02/22 0000)  HIV: Non-reactive (02/22 0000)  GBS: Negative (02/22 0000)     Assessment/Plan: 1.  IUP  33 1/[redacted] weeks gestation 2.  Chronic Hypertension, worsening status with all normal labs 3.  Worsening fetal doppler S/D ratio  Patient received betamethasone over the past 2 days, and even with increase of labetalol BP still unacceptably high.  Admit for fetal evaluation, as well as, meternal status.  Patient aware of reasons for admission and agrees.  Genevia Bouldin H 12/06/2011, 1:09 PM

## 2011-12-07 DIAGNOSIS — O169 Unspecified maternal hypertension, unspecified trimester: Secondary | ICD-10-CM

## 2011-12-07 NOTE — Discharge Summary (Signed)
Physician Discharge Summary  Patient ID: [name] MRN: [id number] DOB/AGE: [birth year] 41 y.o.  Admit date: 12/05/2011 Discharge date: 12/07/2011  Admission Diagnoses: 33 weeks 3 days, chronic hypertension, worsening Discharge Diagnoses:  Active Problems:  Hypertension in pregnancy, antepartum   Discharged Condition: good  Hospital Course: unremarkable  Consults: None  Significant Diagnostic Studies: labs:   Treatments: observation  Discharge Exam: Blood pressure 162/104, pulse 79, temperature 98.2 F (36.8 C), temperature source Oral, resp. rate 20, height 5\' 3"  (1.6 m), weight 286 lb (129.729 kg), last menstrual period 04/15/2011. General appearance: alert, cooperative and no distress Extremities: extremities normal, atraumatic, no cyanosis or edema  Disposition: 01-Home or Self Care  Discharge Orders    Future Orders Please Complete By Expires   Diet - low sodium heart healthy      Increase activity slowly      Discharge instructions      Comments:   Take aldomet 1000 twice a day and labetalol 600 three times a day   Strep B DNA probe      Comments:   This external order was created through the Results Console.   HIV antibody      Comments:   This external order was created through the Results Console.   GC/chlamydia probe amp, genital      Comments:   This external order was created through the Results Console.   Rubella antibody, IgM      Comments:   This external order was created through the Results Console.   Hepatitis B surface antigen      Comments:   This external order was created through the Results Console.   RPR      Comments:   This external order was created through the Results Console.     Medication List  As of 12/07/2011  9:06 AM   STOP taking these medications         acetaminophen 500 MG tablet      ibuprofen 800 MG tablet      labetalol 200 MG tablet         TAKE these medications         methyldopa 500 MG tablet   Commonly known as: ALDOMET   Take 1,000 mg by mouth 2 (two) times daily.      prenatal multivitamin Tabs   Take 1 tablet by mouth daily.           Follow-up Information    Follow up with Lazaro Arms, MD on 12/08/2011.   Contact information:   [facility] [address] [place] [address] [phone]          Signed: Lazaro Arms 12/07/2011, 9:06 AM

## 2011-12-07 NOTE — Discharge Instructions (Signed)
Preeclampsia and Eclampsia Preeclampsia is a condition of high blood pressure during pregnancy. It can happen at 20 weeks or later in pregnancy. If high blood pressure occurs in the second half of pregnancy with no other symptoms, it is called gestational hypertension and goes away after the baby is born. If any of the symptoms listed below develop with gestational hypertension, it is then called preeclampsia. Eclampsia (convulsions) may follow preeclampsia. This is one of the reasons for regular prenatal checkups. Early diagnosis and treatment are very important to prevent eclampsia. CAUSES  There is no known cause of preeclampsia/eclampsia in pregnancy. There are several known conditions that may put the pregnant woman at risk, such as:  The first pregnancy.   Having preeclampsia in a past pregnancy.   Having lasting (chronic) high blood pressure.   Having multiples (twins, triplets).   Being age 35 or older.   African American ethnic background.   Having kidney disease or diabetes.   Medical conditions such as lupus or blood diseases.   Being overweight (obese).  SYMPTOMS   High blood pressure.   Headaches.   Sudden weight gain.   Swelling of hands, face, legs, and feet.   Protein in the urine.   Feeling sick to your stomach (nauseous) and throwing up (vomiting).   Vision problems (blurred or double vision).   Numbness in the face, arms, legs, and feet.   Dizziness.   Slurred speech.   Preeclampsia can cause growth retardation in the fetus.   Separation (abruption) of the placenta.   Not enough fluid in the amniotic sac (oligohydramnios).   Sensitivity to bright lights.   Belly (abdominal) pain.  DIAGNOSIS  If protein is found in the urine in the second half of pregnancy, this is considered preeclampsia. Other symptoms mentioned above may also be present. TREATMENT  It is necessary to treat this.  Your caregiver may prescribe bed rest early in this  condition. Plenty of rest and salt restriction may be all that is needed.   Medicines may be necessary to lower blood pressure if the condition does not respond to more conservative measures.   In more severe cases, hospitalization may be needed:   For treatment of blood pressure.   To control fluid retention.   To monitor the baby to see if the condition is causing harm to the baby.   Hospitalization is the best way to treat the first sign of preeclampsia. This is so the mother and baby can be watched closely and blood tests can be done effectively and correctly.   If the condition becomes severe, it may be necessary to induce labor or to remove the infant by surgical means (cesarean section). The best cure for preeclampsia/eclampsia is to deliver the baby.  Preeclampsia and eclampsia involve risks to mother and infant. Your caregiver will discuss these risks with you. Together, you can work out the best possible approach to your problems. Make sure you keep your prenatal visits as scheduled. Not keeping appointments could result in a chronic or permanent injury, pain, disability to you, and death or injury to you or your unborn baby. If there is any problem keeping the appointment, you must call to reschedule. HOME CARE INSTRUCTIONS   Keep your prenatal appointments and tests as scheduled.   Tell your caregiver if you have any of the above risk factors.   Get plenty of rest and sleep.   Eat a balanced diet that is low in salt, and do not add salt   to your food.   Avoid stressful situations.   Only take over-the-counter and prescriptions medicines for pain, discomfort, or fever as directed by your caregiver.  SEEK IMMEDIATE MEDICAL CARE IF:   You develop severe swelling anywhere in the body. This usually occurs in the legs.   You gain 5 lb/2.3 kg or more in a week.   You develop a severe headache, dizziness, problems with your vision, or confusion.   You have abdominal pain,  nausea, or vomiting.   You have a seizure.   You have trouble moving any part of your body, or you develop numbness or problems speaking.   You have bruising or abnormal bleeding from anywhere in the body.   You develop a stiff neck.   You pass out.  MAKE SURE YOU:   Understand these instructions.   Will watch your condition.   Will get help right away if you are not doing well or get worse.  Document Released: 09/26/2000 Document Revised: 06/11/2011 Document Reviewed: 05/12/2008 [employer] Patient Information 2012 [employer]. 

## 2011-12-10 NOTE — Progress Notes (Signed)
UR chart review completed.  

## 2011-12-16 ENCOUNTER — Inpatient Hospital Stay (HOSPITAL_COMMUNITY): Payer: Medicaid Other

## 2011-12-16 ENCOUNTER — Encounter (HOSPITAL_COMMUNITY): Payer: Self-pay | Admitting: Anesthesiology

## 2011-12-16 ENCOUNTER — Encounter (HOSPITAL_COMMUNITY): Payer: Self-pay | Admitting: Neonatology

## 2011-12-16 ENCOUNTER — Encounter (HOSPITAL_COMMUNITY): Payer: Self-pay | Admitting: *Deleted

## 2011-12-16 ENCOUNTER — Inpatient Hospital Stay (HOSPITAL_COMMUNITY)
Admission: AD | Admit: 2011-12-16 | Discharge: 2011-12-19 | DRG: 774 | Disposition: A | Discharge status: Home / Self Care | Payer: Medicaid Other | Source: Ambulatory Visit | Attending: Obstetrics & Gynecology | Admitting: Obstetrics & Gynecology

## 2011-12-16 ENCOUNTER — Inpatient Hospital Stay (HOSPITAL_COMMUNITY): Payer: Medicaid Other | Admitting: Anesthesiology

## 2011-12-16 DIAGNOSIS — D259 Leiomyoma of uterus, unspecified: Secondary | ICD-10-CM | POA: Diagnosis present

## 2011-12-16 DIAGNOSIS — O09529 Supervision of elderly multigravida, unspecified trimester: Secondary | ICD-10-CM

## 2011-12-16 DIAGNOSIS — O4590 Premature separation of placenta, unspecified, unspecified trimester: Secondary | ICD-10-CM

## 2011-12-16 DIAGNOSIS — O139 Gestational [pregnancy-induced] hypertension without significant proteinuria, unspecified trimester: Secondary | ICD-10-CM

## 2011-12-16 DIAGNOSIS — O1002 Pre-existing essential hypertension complicating childbirth: Secondary | ICD-10-CM | POA: Diagnosis present

## 2011-12-16 DIAGNOSIS — D4959 Neoplasm of unspecified behavior of other genitourinary organ: Secondary | ICD-10-CM

## 2011-12-16 DIAGNOSIS — O34599 Maternal care for other abnormalities of gravid uterus, unspecified trimester: Secondary | ICD-10-CM | POA: Diagnosis present

## 2011-12-16 DIAGNOSIS — O3410 Maternal care for benign tumor of corpus uteri, unspecified trimester: Secondary | ICD-10-CM

## 2011-12-16 HISTORY — DX: Benign neoplasm of connective and other soft tissue, unspecified: D21.9

## 2011-12-16 LAB — CBC
HCT: 35.2 % — ABNORMAL LOW (ref 36.0–46.0)
HCT: 34.1 % — ABNORMAL LOW (ref 36.0–46.0)
Hemoglobin: 11.7 g/dL — ABNORMAL LOW (ref 12.0–15.0)
Hemoglobin: 11.2 g/dL — ABNORMAL LOW (ref 12.0–15.0)
MCH: 31.5 pg (ref 26.0–34.0)
MCH: 31.4 pg (ref 26.0–34.0)
MCHC: 33.2 g/dL (ref 30.0–36.0)
MCHC: 32.8 g/dL (ref 30.0–36.0)
MCV: 94.6 fL (ref 78.0–100.0)
MCV: 95.5 fL (ref 78.0–100.0)
Platelets: 198 10*3/uL (ref 150–400)
Platelets: 199 10*3/uL (ref 150–400)
RBC: 3.72 MIL/uL — ABNORMAL LOW (ref 3.87–5.11)
RBC: 3.57 MIL/uL — ABNORMAL LOW (ref 3.87–5.11)
RDW: 13.8 % (ref 11.5–15.5)
RDW: 13.6 % (ref 11.5–15.5)
WBC: 12.8 10*3/uL — ABNORMAL HIGH (ref 4.0–10.5)
WBC: 17.9 10*3/uL — ABNORMAL HIGH (ref 4.0–10.5)

## 2011-12-16 LAB — RPR: RPR Ser Ql: NONREACTIVE

## 2011-12-16 LAB — URINE MICROSCOPIC-ADD ON

## 2011-12-16 LAB — COMPREHENSIVE METABOLIC PANEL
ALT: 13 U/L (ref 0–35)
AST: 13 U/L (ref 0–37)
Albumin: 2.3 g/dL — ABNORMAL LOW (ref 3.5–5.2)
Alkaline Phosphatase: 99 U/L (ref 39–117)
BUN: 16 mg/dL (ref 6–23)
CO2: 21 mEq/L (ref 19–32)
Calcium: 8.9 mg/dL (ref 8.4–10.5)
Chloride: 107 mEq/L (ref 96–112)
Creatinine, Ser: 0.78 mg/dL (ref 0.50–1.10)
GFR calc Af Amer: >90 mL/min (ref >90)
GFR calc non Af Amer: >90 mL/min (ref >90)
Glucose, Bld: 94 mg/dL (ref 70–99)
Potassium: 3.9 mEq/L (ref 3.5–5.1)
Sodium: 136 mEq/L (ref 135–145)
Total Bilirubin: 0.4 mg/dL (ref 0.3–1.2)
Total Protein: 5.3 g/dL — ABNORMAL LOW (ref 6.0–8.3)

## 2011-12-16 LAB — FIBRINOGEN
Fibrinogen: 458 mg/dL (ref 204–475)
Fibrinogen: 450 mg/dL (ref 204–475)

## 2011-12-16 LAB — MRSA PCR SCREENING: MRSA by PCR: NEGATIVE

## 2011-12-16 LAB — RAPID URINE DRUG SCREEN, HOSP PERFORMED
Amphetamines: POSITIVE — AB
Barbiturates: NOT DETECTED
Benzodiazepines: NOT DETECTED
Cocaine: NOT DETECTED
Opiates: NOT DETECTED
Tetrahydrocannabinol: NOT DETECTED

## 2011-12-16 LAB — URINALYSIS, ROUTINE W REFLEX MICROSCOPIC
Bilirubin Urine: NEGATIVE
Glucose, UA: NEGATIVE mg/dL
Ketones, ur: 15 mg/dL — AB
Leukocytes, UA: NEGATIVE
Nitrite: NEGATIVE
Protein, ur: NEGATIVE mg/dL
Specific Gravity, Urine: >1.03 — ABNORMAL HIGH (ref 1.005–1.030)
Urobilinogen, UA: 1 mg/dL (ref 0.0–1.0)
pH: 6 (ref 5.0–8.0)

## 2011-12-16 LAB — APTT
aPTT: 27 seconds (ref 24–37)
aPTT: 27 seconds (ref 24–37)

## 2011-12-16 LAB — PREPARE RBC (CROSSMATCH)

## 2011-12-16 LAB — PROTIME-INR
INR: 1 (ref 0.00–1.49)
INR: 1.04 (ref 0.00–1.49)
Prothrombin Time: 13.4 seconds (ref 11.6–15.2)
Prothrombin Time: 13.8 seconds (ref 11.6–15.2)

## 2011-12-16 LAB — PROTEIN / CREATININE RATIO, URINE
Creatinine, Urine: 187.88 mg/dL
Protein Creatinine Ratio: 0.19 — ABNORMAL HIGH (ref 0.00–0.15)
Total Protein, Urine: 36.2 mg/dL

## 2011-12-16 LAB — ABO/RH: ABO/RH(D): O POS

## 2011-12-16 MED ORDER — LIDOCAINE HCL (PF) 1 % IJ SOLN
INTRAMUSCULAR | Status: DC | PRN
  Administered 2011-12-16 (×2): 4 mL

## 2011-12-16 MED ORDER — DIPHENHYDRAMINE HCL 50 MG/ML IJ SOLN: 12.5000 mg | INTRAMUSCULAR | Status: DC | PRN

## 2011-12-16 MED ORDER — FENTANYL 2.5 MCG/ML BUPIVACAINE 1/10 % EPIDURAL INFUSION (WH - ANES)
INTRAMUSCULAR | Status: DC | PRN
  Administered 2011-12-16: 14 mL/h via EPIDURAL

## 2011-12-16 MED ORDER — LACTATED RINGERS IV SOLN
INTRAVENOUS | Status: DC
  Administered 2011-12-16: 07:00:00 via INTRAUTERINE

## 2011-12-16 MED ORDER — LACTATED RINGERS IV SOLN
INTRAVENOUS | Status: DC
  Administered 2011-12-16: 125 mL/h via INTRAVENOUS
  Administered 2011-12-16: 08:00:00 via INTRAVENOUS

## 2011-12-16 MED ORDER — LANOLIN HYDROUS EX OINT: TOPICAL_OINTMENT | CUTANEOUS | Status: DC | PRN

## 2011-12-16 MED ORDER — OXYCODONE-ACETAMINOPHEN 5-325 MG PO TABS: 1.0000 | ORAL_TABLET | ORAL | Status: DC | PRN

## 2011-12-16 MED ORDER — ZOLPIDEM TARTRATE 5 MG PO TABS: 5.0000 mg | ORAL_TABLET | Freq: Every evening | ORAL | Status: DC | PRN

## 2011-12-16 MED ORDER — IBUPROFEN 600 MG PO TABS
600.0000 mg | ORAL_TABLET | Freq: Four times a day (QID) | ORAL | Status: DC
  Administered 2011-12-16 – 2011-12-19 (×12): 600 mg via ORAL
  Filled 2011-12-16 (×12): qty 1

## 2011-12-16 MED ORDER — SIMETHICONE 80 MG PO CHEW: 80.0000 mg | CHEWABLE_TABLET | ORAL | Status: DC | PRN

## 2011-12-16 MED ORDER — ONDANSETRON HCL 4 MG PO TABS: 4.0000 mg | ORAL_TABLET | ORAL | Status: DC | PRN

## 2011-12-16 MED ORDER — OXYTOCIN 20 UNITS IN LACTATED RINGERS INFUSION - SIMPLE
125.0000 mL/h | Freq: Once | INTRAVENOUS | Status: AC
  Administered 2011-12-16: 999 mL/h via INTRAVENOUS

## 2011-12-16 MED ORDER — HYDROCHLOROTHIAZIDE 50 MG PO TABS
50.0000 mg | ORAL_TABLET | Freq: Every day | ORAL | Status: DC
  Administered 2011-12-16: 50 mg via ORAL
  Filled 2011-12-16 (×2): qty 1

## 2011-12-16 MED ORDER — ONDANSETRON HCL 4 MG/2ML IJ SOLN: 4.0000 mg | INTRAMUSCULAR | Status: DC | PRN

## 2011-12-16 MED ORDER — ACETAMINOPHEN 325 MG PO TABS: 650.0000 mg | ORAL_TABLET | ORAL | Status: DC | PRN

## 2011-12-16 MED ORDER — SENNOSIDES-DOCUSATE SODIUM 8.6-50 MG PO TABS
2.0000 | ORAL_TABLET | Freq: Every day | ORAL | Status: DC
  Administered 2011-12-16 – 2011-12-18 (×3): 2 via ORAL

## 2011-12-16 MED ORDER — PRENATAL MULTIVITAMIN CH
1.0000 | ORAL_TABLET | Freq: Every day | ORAL | Status: DC
  Administered 2011-12-16 – 2011-12-19 (×4): 1 via ORAL
  Filled 2011-12-16 (×5): qty 1

## 2011-12-16 MED ORDER — EPHEDRINE 5 MG/ML INJ
10.0000 mg | INTRAVENOUS | Status: DC | PRN
  Filled 2011-12-16: qty 4

## 2011-12-16 MED ORDER — LABETALOL HCL 5 MG/ML IV SOLN
10.0000 mg | INTRAVENOUS | Status: DC | PRN
  Administered 2011-12-16 (×3): 10 mg via INTRAVENOUS
  Filled 2011-12-16 (×2): qty 4

## 2011-12-16 MED ORDER — ONDANSETRON HCL 4 MG/2ML IJ SOLN: 4.0000 mg | Freq: Four times a day (QID) | INTRAMUSCULAR | Status: DC | PRN

## 2011-12-16 MED ORDER — TETANUS-DIPHTH-ACELL PERTUSSIS 5-2.5-18.5 LF-MCG/0.5 IM SUSP
0.5000 mL | Freq: Once | INTRAMUSCULAR | Status: AC
  Administered 2011-12-17: 0.5 mL via INTRAMUSCULAR
  Filled 2011-12-16 (×2): qty 0.5

## 2011-12-16 MED ORDER — WITCH HAZEL-GLYCERIN EX PADS: 1.0000 "application " | MEDICATED_PAD | CUTANEOUS | Status: DC | PRN

## 2011-12-16 MED ORDER — SODIUM CHLORIDE 0.9 % IV SOLN
1.0000 g | Freq: Four times a day (QID) | INTRAVENOUS | Status: DC
  Administered 2011-12-16: 1 g via INTRAVENOUS
  Filled 2011-12-16 (×2): qty 1000

## 2011-12-16 MED ORDER — PHENYLEPHRINE 40 MCG/ML (10ML) SYRINGE FOR IV PUSH (FOR BLOOD PRESSURE SUPPORT): 80.0000 ug | PREFILLED_SYRINGE | INTRAVENOUS | Status: DC | PRN

## 2011-12-16 MED ORDER — MAGNESIUM SULFATE BOLUS VIA INFUSION
4.0000 g | Freq: Once | INTRAVENOUS | Status: AC
  Administered 2011-12-16: 4 g via INTRAVENOUS
  Filled 2011-12-16: qty 500

## 2011-12-16 MED ORDER — IBUPROFEN 600 MG PO TABS: 600.0000 mg | ORAL_TABLET | Freq: Four times a day (QID) | ORAL | Status: DC | PRN

## 2011-12-16 MED ORDER — LACTATED RINGERS IV SOLN
INTRAVENOUS | Status: DC
  Administered 2011-12-16 – 2011-12-17 (×3): via INTRAVENOUS

## 2011-12-16 MED ORDER — DIPHENHYDRAMINE HCL 25 MG PO CAPS: 25.0000 mg | ORAL_CAPSULE | Freq: Four times a day (QID) | ORAL | Status: DC | PRN

## 2011-12-16 MED ORDER — BUPIVACAINE HCL (PF) 0.5 % IJ SOLN
INTRAMUSCULAR | Status: AC
  Filled 2011-12-16: qty 30

## 2011-12-16 MED ORDER — OXYCODONE-ACETAMINOPHEN 5-325 MG PO TABS
1.0000 | ORAL_TABLET | ORAL | Status: DC | PRN
  Administered 2011-12-16 (×2): 1 via ORAL
  Administered 2011-12-17: 2 via ORAL
  Filled 2011-12-16: qty 2
  Filled 2011-12-16: qty 1

## 2011-12-16 MED ORDER — LACTATED RINGERS IV SOLN: 500.0000 mL | Freq: Once | INTRAVENOUS | Status: DC

## 2011-12-16 MED ORDER — LACTATED RINGERS IV SOLN: 500.0000 mL | INTRAVENOUS | Status: DC | PRN

## 2011-12-16 MED ORDER — NALBUPHINE SYRINGE 5 MG/0.5 ML
10.0000 mg | INJECTION | INTRAMUSCULAR | Status: DC | PRN
  Administered 2011-12-16: 10 mg via INTRAVENOUS
  Filled 2011-12-16: qty 1

## 2011-12-16 MED ORDER — EPHEDRINE 5 MG/ML INJ: 10.0000 mg | INTRAVENOUS | Status: DC | PRN

## 2011-12-16 MED ORDER — LIDOCAINE HCL (PF) 1 % IJ SOLN
30.0000 mL | INTRAMUSCULAR | Status: DC | PRN
  Filled 2011-12-16: qty 30

## 2011-12-16 MED ORDER — DIBUCAINE 1 % RE OINT: 1.0000 "application " | TOPICAL_OINTMENT | RECTAL | Status: DC | PRN

## 2011-12-16 MED ORDER — METOPROLOL TARTRATE 25 MG PO TABS
25.0000 mg | ORAL_TABLET | Freq: Two times a day (BID) | ORAL | Status: DC
  Administered 2011-12-16 – 2011-12-19 (×7): 25 mg via ORAL
  Filled 2011-12-16 (×11): qty 1

## 2011-12-16 MED ORDER — CITRIC ACID-SODIUM CITRATE 334-500 MG/5ML PO SOLN
30.0000 mL | ORAL | Status: DC | PRN
  Filled 2011-12-16: qty 15

## 2011-12-16 MED ORDER — FLEET ENEMA 7-19 GM/118ML RE ENEM: 1.0000 | ENEMA | RECTAL | Status: DC | PRN

## 2011-12-16 MED ORDER — FENTANYL 2.5 MCG/ML BUPIVACAINE 1/10 % EPIDURAL INFUSION (WH - ANES)
14.0000 mL/h | INTRAMUSCULAR | Status: DC
  Filled 2011-12-16: qty 60

## 2011-12-16 MED ORDER — BENZOCAINE-MENTHOL 20-0.5 % EX AERO: 1.0000 "application " | INHALATION_SPRAY | CUTANEOUS | Status: DC | PRN

## 2011-12-16 MED ORDER — MAGNESIUM SULFATE 40 G IN LACTATED RINGERS - SIMPLE
2.0000 g/h | INTRAVENOUS | Status: DC
  Administered 2011-12-17: 2 g/h via INTRAVENOUS
  Filled 2011-12-16 (×2): qty 500

## 2011-12-16 MED ORDER — OXYTOCIN BOLUS FROM INFUSION
500.0000 mL | Freq: Once | INTRAVENOUS | Status: DC
  Administered 2011-12-16: 08:00:00 via INTRAVENOUS
  Filled 2011-12-16: qty 500
  Filled 2011-12-16: qty 1000

## 2011-12-16 MED ORDER — PHENYLEPHRINE 40 MCG/ML (10ML) SYRINGE FOR IV PUSH (FOR BLOOD PRESSURE SUPPORT)
80.0000 ug | PREFILLED_SYRINGE | INTRAVENOUS | Status: DC | PRN
  Filled 2011-12-16: qty 5

## 2011-12-16 NOTE — Progress Notes (Signed)
LABS DRAWN

## 2011-12-16 NOTE — H&P (Signed)
[name] is a 41 y.o. female presenting for vaginal bleeding starting at 0130 along with painful contractions. History OB History    Grav Para Term Preterm Abortions TAB SAB Ect Mult Living   6 3  3 1  1   3      Past Medical History  Diagnosis Date  . Obesity   . Hypertension   . Fibroid    Past Surgical History  Procedure Date  . Bilateral breast reduction 2004  . Appendectomy   . Cholecystectomy   . Removal of left ovarian cyst 2003  . Btl 2011   Family History: family history includes Coronary artery disease in her father and mother; Hypertension in her father, mother, and sister; and Kidney disease in her father. Social History:  reports that she has never smoked. She has never used smokeless tobacco. She reports that she does not drink alcohol or use illicit drugs.  ROS  Dilation: 7.5 Effacement (%):  (LONG, SOFT) Exam by:: T. Lessard RN Blood pressure 136/91, pulse 85, temperature 97.8 F (36.6 C), temperature source Oral, resp. rate 20, height 5\' 2"  (1.575 m), weight 123.832 kg (273 lb), last menstrual period 04/15/2011, SpO2 100.00%. Exam Physical Exam Heart- rrr Lungs- CTAB Abd- benign, gravid  Prenatal labs: ABO, Rh: --/--/O POS, O POS (03/05 0310) Antibody: NEG (03/05 0310) Rubella: Immune (02/22 0000) RPR: Nonreactive (02/22 0000)  HBsAg: Negative (02/22 0000)  HIV: Non-reactive (02/22 0000)  GBS: Negative (02/22 0000)   Assessment/Plan: 35 weeks with abruption. Chronic HTN with superimposed pre eclampsia Magnesium, amp, type and cross Expect SVD   Randall Colden C. 12/16/2011, 6:39 AM

## 2011-12-16 NOTE — Progress Notes (Signed)
ON ARRIVAL- PT HAS LARGE AMT RED  BLEEDING. Ivonne Andrew , CNM IN ROOM

## 2011-12-16 NOTE — Progress Notes (Signed)
BEDSIDE U/S

## 2011-12-16 NOTE — Progress Notes (Signed)
TRANSFER TO   RM 166 VIA STRETCHER. PASSED GOLF BALL  SIZE CLOTS X3.

## 2011-12-16 NOTE — Anesthesia Postprocedure Evaluation (Signed)
  Anesthesia Post-op Note  Patient: [name]  Procedure(s) Performed: * Lumbar Epidural for L&D*  Patient Location: ICU  Anesthesia Type: Epidural  Level of Consciousness: awake, alert  and oriented  Airway and Oxygen Therapy: Patient Spontanous Breathing  Post-op Pain: none  Post-op Assessment: Post-op Vital signs reviewed, Patient's Cardiovascular Status Stable, Respiratory Function Stable, Patent Airway, No signs of Nausea or vomiting, Adequate PO intake, Pain level controlled, No headache, No backache, No residual numbness and No residual motor weakness  Post-op Vital Signs: Reviewed and stable  Complications: No apparent anesthesia complications

## 2011-12-16 NOTE — Anesthesia Preprocedure Evaluation (Signed)
Anesthesia Evaluation  Patient identified by MRN, date of birth, ID band Patient awake    Reviewed: Allergy & Precautions, H&P , NPO status , Patient's Chart, lab work & pertinent test results, reviewed documented beta blocker date and time   Airway Mallampati: III TM Distance: >3 FB Neck ROM: Full    Dental No notable dental hx. (+) Teeth Intact   Pulmonary neg pulmonary ROS,  breath sounds clear to auscultation  Pulmonary exam normal       Cardiovascular hypertension, Pt. on medications and Pt. on home beta blockers Rhythm:Regular Rate:Normal  Patient with PIH superimposed on chronic HTN   Neuro/Psych negative neurological ROS  negative psych ROS   GI/Hepatic negative GI ROS, Neg liver ROS,   Endo/Other  Morbid obesity  Renal/GU negative Renal ROS  negative genitourinary   Musculoskeletal negative musculoskeletal ROS (+)   Abdominal Normal abdominal exam  (+) + obese,   Peds  Hematology negative hematology ROS (+)   Anesthesia Other Findings   Reproductive/Obstetrics (+) Pregnancy                           Anesthesia Physical  Anesthesia Plan  ASA: III  Anesthesia Plan: Spinal   Post-op Pain Management:    Induction:   Airway Management Planned:   Additional Equipment:   Intra-op Plan:   Post-operative Plan:   Informed Consent: I have reviewed the patients History and Physical, chart, labs and discussed the procedure including the risks, benefits and alternatives for the proposed anesthesia with the patient or authorized representative who has indicated his/her understanding and acceptance.   Dental advisory given  Plan Discussed with: CRNA, Anesthesiologist and Surgeon  Anesthesia Plan Comments:         Anesthesia Quick Evaluation

## 2011-12-16 NOTE — Progress Notes (Signed)
UR chart review completed.  

## 2011-12-16 NOTE — Progress Notes (Addendum)
Dr. Marice Potter notified of FHR, decelerations, and nursing interventions. Provider asked to review the FHR tracing.

## 2011-12-16 NOTE — H&P (Signed)
[name] is a 41 y.o. year old [phone] female at [redacted]w[redacted]d weeks gestation who presents to [facility] reporting heavy vaginal bleeding since immediately before arrival. She denies LOF and reports irreg, moderate contractions. She has been followed closely for [facility] on Labetalol and Aldomet.. Normal growth   Maternal Medical History:  Reason for admission: Reason for admission: vaginal bleeding.  Contractions: Onset was less than 1 hour ago.   Frequency: irregular.    Fetal activity: Perceived fetal activity is normal.   Last perceived fetal movement was within the past hour.    Prenatal complications: Hypertension.   Prenatal Complications - Diabetes: none.    OB History    Grav Para Term Preterm Abortions TAB SAB Ect Mult Living   6 3  3 1  1   3      Past Medical History  Diagnosis Date  . Obesity   . Hypertension   . Fibroid    Past Surgical History  Procedure Date  . Bilateral breast reduction 2004  . Appendectomy   . Cholecystectomy   . Removal of left ovarian cyst 2003  . Btl 2011   Family History: family history includes Coronary artery disease in her father and mother; Hypertension in her father, mother, and sister; and Kidney disease in her father. Social History:  reports that she has never smoked. She has never used smokeless tobacco. She reports that she does not drink alcohol or use illicit drugs.  Review of Systems  Constitutional: Negative for fever and chills.  Eyes: Negative for blurred vision.  Gastrointestinal: Positive for abdominal pain (rare UC's).  Genitourinary: Negative for flank pain.  Neurological: Negative for headaches.   Blood pressure 181/97, pulse 87, temperature 98 F (36.7 C), temperature source Oral, resp. rate 24, weight 128.368 kg (283 lb), last menstrual period 04/15/2011, SpO2 99.00%. Maternal Exam:  Uterine Assessment: Contraction strength is moderate.  Contraction frequency is irregular.   Abdomen: Fundal height is S=D.   Fetal  presentation: vertex  Introitus: Moderate amount of vaginal bleeding.  Pelvis: adequate for delivery.   Cervix: Cervix evaluated by digital exam.     Fetal Exam Fetal Monitor Review: Mode: ultrasound.   Baseline rate: 140.  Variability: moderate (6-25 bpm).   Pattern: accelerations present and no decelerations.    Fetal State Assessment: Category I - tracings are normal.     Physical Exam  Constitutional: She is oriented to person, place, and time. She appears well-developed and well-nourished. She appears distressed (w/ UC's. ).  Cardiovascular: Normal rate.   Respiratory: Effort normal.  GI: Soft. There is no tenderness.  Neurological: She is alert and oriented to person, place, and time. She has normal reflexes.  Skin: Skin is warm and dry.  Psychiatric: Her mood appears anxious.   Dilation: Fingertip Effacement (%):  (LONG, SOFT) Cervical Position: Posterior  Prenatal labs: ABO, Rh: --/--/O POS, O POS (03/05 0310) Antibody: NEG (03/05 0310) Rubella: Immune (02/22 0000) RPR: Nonreactive (02/22 0000)  HBsAg: Negative (02/22 0000)  HIV: Non-reactive (02/22 0000)  GBS: Negative (02/22 0000)   MDM: BS US shows large abruption. Dr. Marice Potter notified. Plan to admit. Will determine if admission will be to antenatal vs BS vs OR depending on pt status over next 30 minutes. IV labetalol. Increase vaginal bleeding w/ clots. Moderate -strong UC's Q2-4 minutes. FHR category II. VE 5/100/-2. Hemodynamically stable. Dr. Marice Potter notified, called to [facility]. Will order type and cross and admit to [facility] for labor. CTO bleeding and maternal and  fetal status closely. Nubain.  Assessment/Plan: Large abruption CHTN vs pre-eclampsia   Admit to BS Dr. Marice Potter to follow  T&C IV Labetalol  [name] 12/16/2011, 4:37 AM

## 2011-12-16 NOTE — Progress Notes (Signed)
S. Comfortable with epidural  O. VSS, AF      FHR- 120s with variables lasting 1 min, down to 60     CVX- 9/99/0  A/P. Abruption- I stopped the amp now that I discovered a negative GBS         Variables- I will start an amnioinfusion and monitor closely.

## 2011-12-16 NOTE — Anesthesia Preprocedure Evaluation (Deleted)
Anesthesia Evaluation  Patient identified by MRN, date of birth, ID band Patient awake    Reviewed: Allergy & Precautions, H&P , NPO status , Patient's Chart, lab work & pertinent test results, reviewed documented beta blocker date and time   Airway Mallampati: III TM Distance: >3 FB Neck ROM: Full    Dental No notable dental hx. (+) Teeth Intact   Pulmonary neg pulmonary ROS,  breath sounds clear to auscultation  Pulmonary exam normal       Cardiovascular hypertension, Pt. on medications and Pt. on home beta blockers Rhythm:Regular Rate:Normal  Patient with PIH superimposed on chronic HTN   Neuro/Psych negative neurological ROS  negative psych ROS   GI/Hepatic negative GI ROS, Neg liver ROS,   Endo/Other  Morbid obesity  Renal/GU negative Renal ROS  negative genitourinary   Musculoskeletal negative musculoskeletal ROS (+)   Abdominal (+) + obese,   Peds  Hematology negative hematology ROS (+)   Anesthesia Other Findings   Reproductive/Obstetrics (+) Pregnancy                           Anesthesia Physical Anesthesia Plan  ASA: III  Anesthesia Plan: Spinal and General   Post-op Pain Management:    Induction:   Airway Management Planned:   Additional Equipment:   Intra-op Plan:   Post-operative Plan:   Informed Consent: I have reviewed the patients History and Physical, chart, labs and discussed the procedure including the risks, benefits and alternatives for the proposed anesthesia with the patient or authorized representative who has indicated his/her understanding and acceptance.   Dental advisory given  Plan Discussed with: CRNA, Anesthesiologist and Surgeon  Anesthesia Plan Comments:         Anesthesia Quick Evaluation

## 2011-12-16 NOTE — Progress Notes (Signed)
PT ARRIVED VIA EMS-   SAYS HER VAG BLEEDING STARTED  AT 0200- TIMING UC - GOT IN B- TUB- FELT POP- A LOT OF BLOOD IN TUB- CALLED EMS.

## 2011-12-16 NOTE — Progress Notes (Signed)
Patient ID: [name], female   DOB: [birth year], 41 y.o.   MRN: [id number]   S. Would like an epidural  O. VSS, AF     FHR- reassuring, ISE placed     AROM- bloody fluid     CVX- 7/99/0  A/P. Abruption, probably due to preeclampsia- I will start amp, mag, and expect SVD

## 2011-12-16 NOTE — Progress Notes (Signed)
PT STILL BLEEDING- DARK RED WITH SMALL CLOTS.- [facility]

## 2011-12-16 NOTE — Anesthesia Procedure Notes (Signed)
Epidural Patient location during procedure: OB Start time: 12/16/2011 6:12 AM  Staffing Anesthesiologist: Malen Gauze, Keshav Winegar A. Performed by: anesthesiologist   Preanesthetic Checklist Completed: patient identified, site marked, surgical consent, pre-op evaluation, timeout performed, IV checked, risks and benefits discussed and monitors and equipment checked  Epidural Patient position: sitting Prep: site prepped and draped and DuraPrep Patient monitoring: continuous pulse ox and blood pressure Approach: midline Injection technique: LOR air  Needle:  Needle type: Tuohy  Needle gauge: 17 G Needle length: 9 cm Needle insertion depth: 8 cm Catheter type: closed end flexible Catheter size: 19 Gauge Catheter at skin depth: 14 cm Test dose: negative and Other  Assessment Events: blood not aspirated, injection not painful, no injection resistance, negative IV test and no paresthesia  Additional Notes Patient identified. Risks and benefits discussed including failed block, incomplete  Pain control, post dural puncture headache, nerve damage, paralysis, blood pressure Changes, nausea, vomiting, reactions to medications-both toxic and allergic and post Partum back pain. All questions were answered. Patient expressed understanding and wished to proceed. Sterile technique was used throughout procedure. Epidural site was Dressed with sterile barrier dressing. No paresthesias, signs of intravascular injection Or signs of intrathecal spread were encountered.  Patient was more comfortable after the epidural was dosed. Please see RN's note for documentation of vital signs and FHR which are stable.

## 2011-12-17 LAB — CBC
HCT: 28.9 % — ABNORMAL LOW (ref 36.0–46.0)
Hemoglobin: 9.5 g/dL — ABNORMAL LOW (ref 12.0–15.0)
MCH: 31.4 pg (ref 26.0–34.0)
MCHC: 32.9 g/dL (ref 30.0–36.0)
MCV: 95.4 fL (ref 78.0–100.0)
Platelets: 183 10*3/uL (ref 150–400)
RBC: 3.03 MIL/uL — ABNORMAL LOW (ref 3.87–5.11)
RDW: 13.9 % (ref 11.5–15.5)
WBC: 17.7 10*3/uL — ABNORMAL HIGH (ref 4.0–10.5)

## 2011-12-17 MED ORDER — TRIAMTERENE-HCTZ 75-50 MG PO TABS
1.0000 | ORAL_TABLET | Freq: Every day | ORAL | Status: DC
  Administered 2011-12-17: 2 via ORAL
  Filled 2011-12-17 (×2): qty 1

## 2011-12-17 MED ORDER — CLONIDINE HCL 0.3 MG PO TABS
0.3000 mg | ORAL_TABLET | Freq: Two times a day (BID) | ORAL | Status: DC
  Administered 2011-12-17 – 2011-12-19 (×5): 0.3 mg via ORAL
  Filled 2011-12-17 (×10): qty 1

## 2011-12-17 MED ORDER — TRIAMTERENE-HCTZ 37.5-25 MG PO TABS
2.0000 | ORAL_TABLET | Freq: Every day | ORAL | Status: DC
  Administered 2011-12-18 – 2011-12-19 (×2): 2 via ORAL
  Filled 2011-12-17 (×5): qty 2

## 2011-12-17 NOTE — Progress Notes (Signed)
Dr. Debroah Loop in to see pt. On "rounds" Pt. Explained her normal "non-pregnant" antihypertensive regimine and orders received  for change in her oral anti-hypertensives. Magnesium gtt stopped. Pt. without c/o, ambulating well and in good spirits. Will monitor start new po meds and monitor b/p

## 2011-12-17 NOTE — Progress Notes (Signed)
Post Partum Day 1 Subjective: no complaints  Objective: Blood pressure 172/127, pulse 117, temperature 97.6 F (36.4 C), temperature source Oral, resp. rate 18, height 5\' 3"  (1.6 m), weight 127.943 kg (282 lb 1 oz), last menstrual period 04/15/2011, SpO2 100.00%, unknown if currently breastfeeding. Current facility-administered medications:benzocaine-Menthol (DERMOPLAST) 20-0.5 % topical spray 1 application, 1 application, Topical, PRN, Myra C. Dove, MD;  cloNIDine (CATAPRES) tablet 0.3 mg, 0.3 mg, Oral, BID, Scheryl Darter, MD;  dibucaine (NUPERCAINAL) 1 % rectal ointment 1 application, 1 application, Rectal, PRN, Myra C. Marice Potter, MD;  diphenhydrAMINE (BENADRYL) capsule 25 mg, 25 mg, Oral, Q6H PRN, Myra C. Marice Potter, MD ibuprofen (ADVIL,MOTRIN) tablet 600 mg, 600 mg, Oral, Q6H, Myra C. Dove, MD, 600 mg at 12/17/11 0521;  labetalol (NORMODYNE,TRANDATE) injection 10 mg, 10 mg, Intravenous, Q10 min PRN, Virginia Smith, CNM, 10 mg at 12/16/11 1506;  lactated ringers infusion, , Intravenous, Continuous, Myra C. Marice Potter, MD, Last Rate: 100 mL/hr at 12/17/11 0800;  lanolin ointment, , Topical, PRN, Myra C. Marice Potter, MD metoprolol tartrate (LOPRESSOR) tablet 25 mg, 25 mg, Oral, BID, Karyl Kinnier Stinson, DO, 25 mg at 12/16/11 2134;  nalbuphine (NUBAIN) injection 10 mg, 10 mg, Intravenous, Q3H PRN, Dorathy Kinsman, CNM, 10 mg at 12/16/11 0428;  ondansetron (ZOFRAN) injection 4 mg, 4 mg, Intravenous, Q4H PRN, Myra C. Dove, MD;  ondansetron [facility]) tablet 4 mg, 4 mg, Oral, Q4H PRN, Myra C. Marice Potter, MD oxyCODONE-acetaminophen (PERCOCET) 5-325 MG per tablet 1-2 tablet, 1-2 tablet, Oral, Q3H PRN, Myra C. Marice Potter, MD, 1 tablet at 12/16/11 2321;  prenatal multivitamin tablet 1 tablet, 1 tablet, Oral, Daily, Myra C. Marice Potter, MD, 1 tablet at 12/16/11 1048;  senna-docusate (Senokot-S) tablet 2 tablet, 2 tablet, Oral, QHS, Myra C. Marice Potter, MD, 2 tablet at 12/16/11 2133;  simethicone [facility]) chewable tablet 80 mg, 80 mg, Oral, PRN, Myra C. Marice Potter,  MD TDaP (BOOSTRIX) injection 0.5 mL, 0.5 mL, Intramuscular, Once, Myra C. Marice Potter, MD;  triamterene-hydrochlorothiazide (MAXZIDE) 75-50 MG per tablet 1 tablet, 1 tablet, Oral, Daily, Scheryl Darter, MD;  witch hazel-glycerin (TUCKS) pad 1 application, 1 application, Topical, PRN, Myra C. Dove, MD;  zolpidem (AMBIEN) tablet 5 mg, 5 mg, Oral, QHS PRN, Myra C. Marice Potter, MD;  DISCONTD: acetaminophen (TYLENOL) tablet 650 mg, 650 mg, Oral, Q4H PRN, Myra C. Marice Potter, MD DISCONTD: citric acid-sodium citrate (ORACIT) solution 30 mL, 30 mL, Oral, Q2H PRN, Myra C. Marice Potter, MD;  DISCONTD: diphenhydrAMINE (BENADRYL) injection 12.5 mg, 12.5 mg, Intravenous, Q15 min PRN, Tyrone Apple. Malen Gauze, MD;  DISCONTD: ePHEDrine injection 10 mg, 10 mg, Intravenous, PRN, Tyrone Apple. Malen Gauze, MD;  DISCONTD: ePHEDrine injection 10 mg, 10 mg, Intravenous, PRN, Tyrone Apple. Malen Gauze, MD DISCONTD: fentaNYL 2.5 mcg/ml w/bupivacaine 0.1% epidural (60 ml syringe), 14 mL/hr, Epidural, Continuous, Michael A. Malen Gauze, MD;  DISCONTD: hydrochlorothiazide (HYDRODIURIL) tablet 50 mg, 50 mg, Oral, Daily, Virginia Smith, CNM, 50 mg at 12/16/11 1047;  DISCONTD: ibuprofen (ADVIL,MOTRIN) tablet 600 mg, 600 mg, Oral, Q6H PRN, Myra C. Marice Potter, MD;  DISCONTD: lactated ringers infusion 500 mL, 500 mL, Intravenous, Once, Michael A. Malen Gauze, MD DISCONTD: lactated ringers infusion 500-1,000 mL, 500-1,000 mL, Intravenous, PRN, Myra C. Marice Potter, MD;  DISCONTD: lactated ringers infusion, , Intravenous, Continuous, Myra C. Marice Potter, MD;  DISCONTD: lactated ringers infusion, , Intrauterine, Continuous, Myra C. Marice Potter, MD;  DISCONTD: lidocaine (XYLOCAINE) 1 % injection 30 mL, 30 mL, Subcutaneous, PRN, Myra C. Marice Potter, MD DISCONTD: magnesium sulfate 40 grams in LR 500 mL OB infusion, 2 g/hr, Intravenous, Titrated, Myra C. Marice Potter, MD, Last  Rate: 25 mL/hr at 12/17/11 0800, 2 g/hr at 12/17/11 0800;  DISCONTD: ondansetron (ZOFRAN) injection 4 mg, 4 mg, Intravenous, Q6H PRN, Myra C. Marice Potter, MD;  DISCONTD:  oxyCODONE-acetaminophen (PERCOCET) 5-325 MG per tablet 1-2 tablet, 1-2 tablet, Oral, Q3H PRN, Myra C. Marice Potter, MD DISCONTD: oxytocin (PITOCIN) IV BOLUS FROM BAG, 500 mL, Intravenous, Once, Myra C. Marice Potter, MD;  DISCONTD: phenylephrine injection 80 mcg, 80 mcg, Intravenous, PRN, Tyrone Apple. Malen Gauze, MD;  DISCONTD: phenylephrine injection 80 mcg, 80 mcg, Intravenous, PRN, Tyrone Apple. Malen Gauze, MD;  DISCONTD: sodium phosphate (FLEET) 7-19 GM/118ML enema 1 enema, 1 enema, Rectal, PRN, Myra C. Marice Potter, MD Facility-Administered Medications Ordered in Other Encounters: DISCONTD: fentaNYL 2.5 mcg/ml w/bupivacaine 0.1% epidural (60 ml syringe), , , Continuous PRN, Tyrone Apple. Malen Gauze, MD, 14 mL/hr at 12/16/11 [id number];  DISCONTD: lidocaine (XYLOCAINE) 1 % injection, , , PRN, Casimiro Needle A. Malen Gauze, MD, 4 mL at 12/16/11 8119  Physical Exam:  General: alert, cooperative and no distress Lochia: appropriate Uterine Fundus: firm, enlarged c/w fibroids  DVT Evaluation: No evidence of DVT seen on physical exam.   Basename 12/17/11 0430 12/16/11 0841  HGB 9.5* 11.2*  HCT 28.9* 34.1*    Assessment/Plan: Severe CHTN, s/p magnesium for [facility], states she needs her usual HTN meds. Start clonidine, Maxzide, continue metoprolol   LOS: 1 day   Santoria Chason 12/17/2011, 8:56 AM

## 2011-12-18 NOTE — Progress Notes (Signed)
Post Partum Day 1 Subjective: no complaints, up ad lib, voiding and tolerating PO  Objective: Blood pressure 142/98, pulse 104, temperature 98.8 F (37.1 C), temperature source Oral, resp. rate 20, height 5\' 3"  (1.6 m), weight 278 lb (126.1 kg), last menstrual period 04/15/2011, SpO2 99.00%, unknown if currently breastfeeding.   Filed Vitals:   12/17/11 1956 12/17/11 2142 12/18/11 0201 12/18/11 0552  BP: 158/118 152/104 150/110 142/98  Pulse: 105 97 105 104  Temp:  98.6 F (37 C) 98.2 F (36.8 C) 98.8 F (37.1 C)  TempSrc:  Oral Oral Oral  Resp:  22 20 20   Height:      Weight:    278 lb (126.1 kg)  SpO2:   100% 99%    Physical Exam:  General: alert, cooperative and no distress Lochia: appropriate Uterine Fundus: firm Incision: na DVT Evaluation: No evidence of DVT seen on physical exam.   Basename 12/17/11 0430 12/16/11 0841  HGB 9.5* 11.2*  HCT 28.9* 34.1*    Assessment/Plan: BP better, continue current regimen which was pre pregnancy regimen ACE inhibitor may also be a good choice in the weeks ahead as well. Plan for discharge tomorrow and Contraception will plan salpingectomy at 6-8 weeks, interval depo   LOS: 2 days   EURE,LUTHER H 12/18/2011, 7:45 AM

## 2011-12-18 NOTE — Progress Notes (Signed)
Patient passed large orange-sized clot.  Save for MD to see.  Lochia small, fundus UE and firm.  Patient denies any pain at this time

## 2011-12-19 MED ORDER — IBUPROFEN 600 MG PO TABS: 600.0000 mg | ORAL_TABLET | Freq: Four times a day (QID) | ORAL | Status: AC

## 2011-12-19 MED ORDER — OXYCODONE-ACETAMINOPHEN 5-325 MG PO TABS: 1.0000 | ORAL_TABLET | ORAL | Status: DC | PRN

## 2011-12-19 NOTE — Discharge Instructions (Signed)

## 2011-12-19 NOTE — Progress Notes (Signed)
Pt d/c home teaching complete   Baby in nicu

## 2011-12-19 NOTE — Discharge Summary (Addendum)
Patient seen and agree with note except pt may need to be seen at 4 weeks instead of 6 at Dr Forestine Chute office.

## 2011-12-19 NOTE — Discharge Summary (Signed)
Obstetric Discharge Summary  [name] is a 41 yo [id number] at PPD #3 after SVD at [redacted] wks gestation doing well with no complaints at this time.  Her pain is well controlled with Ibuprofen.  She has minimal lochia.  She is urinating, walking, passing stools and gas without difficulty. She continues to bottle feed.  She plans on having a bilateral salpingectomy for future family planning.  She denies headache, lightheadedness, or chest pain.  Reason for Admission: Placental Abruption and Gestational Hypertension Prenatal Procedures: NST and ultrasound, magnesium, and antibiotics Intrapartum Procedures: spontaneous vaginal delivery Postpartum Procedures: HTN medications Complications-Operative and Postpartum: placental abruption Hemoglobin  Date Value Range Status  12/17/2011 9.5* 12.0-15.0 (g/dL) Final     HCT  Date Value Range Status  12/17/2011 28.9* 36.0-46.0 (%) Final    Discharge Diagnoses: Premature labor and placental abruption, and CHTN  Discharge Information: Date: 12/19/2011 Activity: pelvic rest Diet: routine Medications: Ibuprofen Condition: stable Instructions: refer to practice specific booklet Discharge to: home  PE  GEN: A&Ox3.  Cooperative, pleasant, in no acute distress. CV: RRR. No murmurs, rubs, gallops noted. Lungs: CTAB.  No wheezes, rales, rhonchi noted. Abd: Soft, non-tender, fundus firm at umbilicus Ext: Non-tender, - Homan's  Newborn Data: Live born female  Birth Weight: 3 lb 15.7 oz (1806 g) APGAR: 7, 8 Infant will continue to be an admission in NICU at this point.   Assessment [name] is a 41 yo [id number] at PPD #3 after SVD at [redacted] wks gestation  Plan Continue hypertension medications Clonidine, Maxzide, and Metoprolol. Patient will follow up at [facility] in 6 weeks.  Will plan bilateral salpingectomy in 6-8 weeks with interval Depo.  Mardene Speak 12/19/2011, 12:19 PM

## 2011-12-19 NOTE — Progress Notes (Signed)
PSYCHOSOCIAL ASSESSMENT ~ MATERNAL/CHILD Name: [name]                                                                                                          Age: 41   Referral Date: 12/19/11 Reason/Source: NICU Support, MOB positive for Amphetamines/NICU  I. FAMILY/HOME ENVIRONMENT A. Child's Legal Guardian _x__Parent(s) ___Grandparent ___Foster parent ___DSS_________________ Name: Youlanda Wiltse                                                               DOB: 11/21/1971                     Age: 39  Address: [address], [place], [place] [address]  Name: Thurman [name]                                                     DOB: //                               Age:   Address: same  B. Other Household Members/Support Persons Name: Tyrone Mills (20)             Relationship: brother                   Name: Taylor Mills (11)              Relationship: sister                    Name: Tru [name] (2)                 Relationship: sister              DOB: 10/25/09                 C. Other Support: Great support system-mainly MGM, Maternal Aunt and FOB   II. PSYCHOSOCIAL DATA A. Information Source                                                                                             _x_Patient Interview  __Family Interview           _x_Other: chart  B. Financial and Community Resources _x_Employment: MOB-[employer] _x_Medicaid    County: [place]                __Private Insurance:                   __Self Pay  __Food Stamps   _x_WIC __[employer]     __Public Housing     __Section 8    __Maternity Care Coordination/Child Service Coordination/Early Intervention  __School:                                                                         Grade:  __Other:   C. Cultural and Environment Information Cultural Issues Impacting Care: none known  III. STRENGTHS _x__Supportive family/friends _x__Adequate Resources _x__Compliance with medical plan _x__Home prepared for Child  (including basic supplies) _x__Understanding of illness      _x__Other: Pediatric follow up will be with Dr. Amy Boyd at [facility] in [place]. IV. RISK FACTORS AND CURRENT PROBLEMS         __x__No Problems Noted                                                                                                                                                                                                                                       Pt              Family     Substance Abuse                                                                ___              ___        Mental Illness                                                                          ___              ___  Family/Relationship Issues                                      ___               ___             Abuse/Neglect/Domestic Violence                                         ___         ___  Financial Resources                                        ___              ___             Transportation                                                                        ___               ___  DSS Involvement                                                                   ___              ___  Adjustment to Illness                                                               ___              ___  Knowledge/Cognitive Deficit                                                   ___              ___             Compliance with Treatment                                                 ___                ___  Basic Needs (food, housing, etc.)                                          ___              ___             Housing Concerns                                       ___              ___ Other_____________________________________________________________            V. SOCIAL WORK ASSESSMENT SW met with MOB in her third floor room to introduce myself, complete assessment and evaluate how she is coping with baby's admission to NICU.  MOB  was extremely friendly and pleasant to talk with.  She states that she and baby are doing well and that she is ready to get home to her other children even though it will be hard to leave baby in the hospital at her discharge today.  She states that she has a great support system who is caring for her 40 year old and 40 year old while she is in the hospital.  Her mother, sister, and FOB are her main support people and she also has a 20 year old son living at home.  She states her mother and sister live within walking distance and her large extended family all live in [place].  She reports that her 40 year old was born prematurely at 33 weeks and spent two weeks in the NICU at [facility].  She conceived that baby unexpectedly after having a partial hysterectomy after delivering her 40 year old.  She reports that she had a tubal ligation after her 40 year old, so this pregnancy was another surprise.  She states that she already can't imagine life without baby and is incredibly happy about having another child, even if it was not planned.  She reports having a good relationship with FOB and that he is involved and supportive.  She has worked for [employer] for 12 years, but was going to be forced to move to another state to keep her current position so she took a package.  She says that she has four more months to reapply for another position to not lose her seniority with the company.  She states that since her last baby came prematurely, she was prepared for the possibility of this baby coming being born early and has everything prepared at home.  She states she is much calmer this time since she knows what to expect from the NICU experience and remembers some of the staff and how well her baby was cared for.  SW asked MOB about her positive drug screen for Amphetamines.  MOB was aware of this and denies all drug use.  She asked if the medication Coricidin she took a week or so ago for a cold could have  caused this.  SW spoke to Julie C./Pharmacist who states that this medication has a drug ingredient that could cause a false positive.  MOB also called her doctor's office to inquire and they stated that she was on another medication, Methyldopa, that would cause a positive screen for Amphetamines.    SW is not concerned since there is an explanation for the positive screen.  SW has no social concerns at this time.  SW explained support services offered by NICU SW and told MOB how to contact SW if any needs arise.  MOB seemed appreciative and thanked SW for visiting.  VI. SOCIAL WORK PLAN  ___No Further Intervention Required/No Barriers to Discharge   _x__Psychosocial Support and Ongoing Assessment of Needs   ___Patient/Family Education:   ___Child Protective Services Report   County___________ Date___/____/____   ___Information/Referral to Community Resources_________________________   ___Other: 

## 2011-12-20 LAB — TYPE AND SCREEN
ABO/RH(D): O POS
Antibody Screen: NEGATIVE
Unit division: 0
Unit division: 0

## 2012-02-02 ENCOUNTER — Encounter (HOSPITAL_COMMUNITY): Payer: Self-pay | Admitting: Pharmacy Technician

## 2012-02-02 NOTE — Patient Instructions (Addendum)
21 [name]  02/02/2012   Your procedure is scheduled on:   02/11/2012  Report to [facility] at  820  AM.  Call this number if you have problems the morning of surgery: [phone]   Remember:   Do not eat food:After Midnight.  May have clear liquids:until Midnight .  Clear liquids include soda, tea, black coffee, apple or grape juice, broth.  Take these medicines the morning of surgery with A SIP OF WATER: labetolol,aldomet   Do not wear jewelry, make-up or nail polish.  Do not wear lotions, powders, or perfumes. You may wear deodorant.  Do not shave 48 hours prior to surgery.  Do not bring valuables to the hospital.  Contacts, dentures or bridgework may not be worn into surgery.  Leave suitcase in the car. After surgery it may be brought to your room.  For patients admitted to the hospital, checkout time is 11:00 AM the day of discharge.   Patients discharged the day of surgery will not be allowed to drive home.  Name and phone number of your driver: family  Special Instructions: CHG Shower Use Special Wash: 1/2 bottle night before surgery and 1/2 bottle morning of surgery.   Please read over the following fact sheets that you were given: Pain Booklet, MRSA Information, Surgical Site Infection Prevention, Anesthesia Post-op Instructions and Care and Recovery After Surgery Bilateral Salpingo-Oophorectomy Removal of both fallopian tubes and ovaries is called a Bilateral Salpingo-oophorectomy (BSO). The fallopian tubes transport the egg from the ovary to the womb (uterus). The fallopian tube is also where the sperm and egg meet and become fertilized and move down into the uterus. Usually when a BSO is done, the uterus was previously removed. Removing both tubes and ovaries will:  Put you into the menopause. You will no longer have menstrual periods.   May cause you to have symptoms of menopause (hot flashes, night sweats, mood changes).   Not affect your sex drive or physical  relationship.   Cause you to not be able to become pregnant (sterile).  LET YOUR CAREGIVER KNOW ABOUT:  Allergies to food or medication.   Medications taken including herbs, eye drops, over-the-counter medications, and creams.   Use of steroids (by mouth or creams).   Previous problems with anesthetics or numbing medication.   Possibility of pregnancy, if this applies.   Your smoking habits   History of blood clots (thrombophlebitis).   History of bleeding or blood problems.   Previous surgeries.   Other health problems.  RISKS AND COMPLICATIONS All surgery is associated with risks. Some of these risks are:  Injury to surrounding organs.   Bleeding.   Infection.   Blood clots in the legs or lungs.   Problems with the anesthesia.   The surgery does not help the problem.   Death.  BEFORE THE PROCEDURE  Do not take aspirin or blood thinners because it can make you bleed.   Do not eat or drink anything at least 8 hours before the surgery.   Let your caregiver know if you develop a cold or an infection.   If you are being admitted the day of surgery, arrive at least 1 hour before the surgery to read and sign the necessary forms and consents.   Arrange for help when you go home from the hospital.   If you smoke, do not smoke for at least 2 weeks before the surgery.  PROCEDURE  You will change into a hospital gown. Then,  you will be given an IV (intravenous) and a medication to relax you. You will be put to sleep with an anesthetic. Any hair on your lower belly (abdomen) will be removed, and a catheter will be placed in your bladder. The fallopian tubes and ovaries will be removed either through 2 very small cuts (incisions) or through large incision in the lower abdomen. AFTER THE PROCEDURE  You will be taken to the recovery room for 1 to 3 hours until your blood pressure, pulse, and temperature are stable and you are waking up.   If you had a laparoscopy, you may  be discharged in several hours.   If you had a laparoscopy, you may have shoulder pain for a day or two from air left in the abdomen. The air can irritate the nerve that goes from the diaphragm to the shoulder.   You will be given pain medication as is necessary.   The intravenous and catheter will be removed.   Have someone available to take you home from the hospital.  HOME CARE INSTRUCTIONS   Only take over-the-counter or prescription medicines for pain, discomfort, or fever as directed by your caregiver.   Do not take aspirin. It can cause bleeding.   Do not drive when taking pain medication.   Follow your caregiver's advice regarding diet, exercise, lifting, driving, and general activities.   You may resume your usual diet as directed and allowed.   Get plenty of rest and sleep.   Do not douche, use tampons, or have sexual intercourse until your caregiver says it is okay.   Change your bandages (dressings) as directed.   Take your temperature twice a day and write it down.   Your caregiver may recommend showers instead of baths for a few weeks.   Do not drink alcohol until your caregiver says it is okay.   If you develop constipation, you may take a mild laxative with your caregiver's permission. Bran foods and drinking fluids helps with constipation problems.   Try to have someone home with you for a week or two to help with the household activities.   Make sure you and your family understands everything about your operation and recovery.   Do not sign any legal documents until you feel normal again.   Keep all your follow-up appointments.  SEEK MEDICAL CARE IF:   There is swelling, redness, or increasing pain in the wound area.   Pus is coming from the wound.   You notice a bad smell from the wound or surgical dressing.   You have pain, redness, or swelling from the intravenous site.   The wound is breaking open (the edges are not staying together).   You  feel dizzy or feel like fainting.   You develop pain or bleeding when you urinate.   You develop diarrhea.   You develop nausea and vomiting.   You develop abnormal vaginal discharge.   You develop a rash.   You have any type of abnormal reaction or develop an allergy to your medication.   You need stronger pain medication for your pain.  SEEK IMMEDIATE MEDICAL CARE IF:   You develop an unexplained temperature above 100 F (37.8 C).   You develop abdominal pain.   You develop chest pain.   You develop shortness of breath.   You pass out.   You develop pain, swelling, or redness of your leg.   You develop heavy vaginal bleeding with or without blood clots.  Document Released: 09/29/2005 Document Revised: 09/18/2011 Document Reviewed: 02/23/2009 [facility] Patient Information 2012 [place], [place].PATIENT INSTRUCTIONS POST-ANESTHESIA  IMMEDIATELY FOLLOWING SURGERY:  Do not drive or operate machinery for the first twenty four hours after surgery.  Do not make any important decisions for twenty four hours after surgery or while taking narcotic pain medications or sedatives.  If you develop intractable nausea and vomiting or a severe headache please notify your doctor immediately.  FOLLOW-UP:  Please make an appointment with your surgeon as instructed. You do not need to follow up with anesthesia unless specifically instructed to do so.  WOUND CARE INSTRUCTIONS (if applicable):  Keep a dry clean dressing on the anesthesia/puncture wound site if there is drainage.  Once the wound has quit draining you may leave it open to air.  Generally you should leave the bandage intact for twenty four hours unless there is drainage.  If the epidural site drains for more than 36-48 hours please call the anesthesia department.  QUESTIONS?:  Please feel free to call your physician or the hospital operator if you have any questions, and they will be happy to assist you.     [facility]  Anesthesia Department [address] [place] [phone]

## 2012-02-03 ENCOUNTER — Encounter (HOSPITAL_COMMUNITY)
Admission: RE | Admit: 2012-02-03 | Discharge: 2012-02-03 | Disposition: A | Discharge status: Home / Self Care | Payer: Medicaid Other | Source: Ambulatory Visit | Attending: Obstetrics & Gynecology | Admitting: Obstetrics & Gynecology

## 2012-02-03 ENCOUNTER — Other Ambulatory Visit: Payer: Self-pay

## 2012-02-03 ENCOUNTER — Other Ambulatory Visit: Payer: Self-pay | Admitting: Obstetrics & Gynecology

## 2012-02-03 ENCOUNTER — Encounter (HOSPITAL_COMMUNITY): Payer: Self-pay

## 2012-02-03 LAB — COMPREHENSIVE METABOLIC PANEL
ALT: 25 U/L (ref 0–35)
AST: 26 U/L (ref 0–37)
Albumin: 3.2 g/dL — ABNORMAL LOW (ref 3.5–5.2)
Alkaline Phosphatase: 111 U/L (ref 39–117)
BUN: 13 mg/dL (ref 6–23)
CO2: 25 mEq/L (ref 19–32)
Calcium: 8.9 mg/dL (ref 8.4–10.5)
Chloride: 105 mEq/L (ref 96–112)
Creatinine, Ser: 0.75 mg/dL (ref 0.50–1.10)
GFR calc Af Amer: >90 mL/min (ref >90)
GFR calc non Af Amer: >90 mL/min (ref >90)
Glucose, Bld: 100 mg/dL — ABNORMAL HIGH (ref 70–99)
Potassium: 3.3 mEq/L — ABNORMAL LOW (ref 3.5–5.1)
Sodium: 138 mEq/L (ref 135–145)
Total Bilirubin: 0.7 mg/dL (ref 0.3–1.2)
Total Protein: 6.3 g/dL (ref 6.0–8.3)

## 2012-02-03 LAB — URINALYSIS, ROUTINE W REFLEX MICROSCOPIC
Bilirubin Urine: NEGATIVE
Glucose, UA: NEGATIVE mg/dL
Ketones, ur: NEGATIVE mg/dL
Nitrite: NEGATIVE
Protein, ur: NEGATIVE mg/dL
Specific Gravity, Urine: 1.025 (ref 1.005–1.030)
Urobilinogen, UA: 1 mg/dL (ref 0.0–1.0)
pH: 6 (ref 5.0–8.0)

## 2012-02-03 LAB — CBC
HCT: 33.2 % — ABNORMAL LOW (ref 36.0–46.0)
Hemoglobin: 10.7 g/dL — ABNORMAL LOW (ref 12.0–15.0)
MCH: 28.5 pg (ref 26.0–34.0)
MCHC: 32.2 g/dL (ref 30.0–36.0)
MCV: 88.3 fL (ref 78.0–100.0)
Platelets: 271 10*3/uL (ref 150–400)
RBC: 3.76 MIL/uL — ABNORMAL LOW (ref 3.87–5.11)
RDW: 13.7 % (ref 11.5–15.5)
WBC: 7.8 10*3/uL (ref 4.0–10.5)

## 2012-02-03 LAB — URINE MICROSCOPIC-ADD ON

## 2012-02-03 LAB — HCG, QUANTITATIVE, PREGNANCY: hCG, Beta Chain, Quant, S: 1 m[IU]/mL (ref <5)

## 2012-02-03 LAB — SURGICAL PCR SCREEN
MRSA, PCR: NEGATIVE
Staphylococcus aureus: NEGATIVE

## 2012-02-11 ENCOUNTER — Encounter (HOSPITAL_COMMUNITY): Payer: Self-pay | Admitting: *Deleted

## 2012-02-11 ENCOUNTER — Ambulatory Visit (HOSPITAL_COMMUNITY): Payer: Medicaid Other | Admitting: Anesthesiology

## 2012-02-11 ENCOUNTER — Encounter (HOSPITAL_COMMUNITY): Payer: Self-pay | Admitting: Anesthesiology

## 2012-02-11 ENCOUNTER — Ambulatory Visit (HOSPITAL_COMMUNITY)
Admission: RE | Admit: 2012-02-11 | Discharge: 2012-02-11 | Disposition: A | Discharge status: Home / Self Care | Payer: Medicaid Other | Source: Ambulatory Visit | Attending: Obstetrics & Gynecology | Admitting: Obstetrics & Gynecology

## 2012-02-11 ENCOUNTER — Encounter (HOSPITAL_COMMUNITY)
Admission: RE | Disposition: A | Discharge status: Home / Self Care | Payer: Self-pay | Source: Ambulatory Visit | Attending: Obstetrics & Gynecology

## 2012-02-11 DIAGNOSIS — Z01810 Encounter for preprocedural cardiovascular examination: Secondary | ICD-10-CM | POA: Insufficient documentation

## 2012-02-11 DIAGNOSIS — IMO0002 Reserved for concepts with insufficient information to code with codable children: Secondary | ICD-10-CM | POA: Insufficient documentation

## 2012-02-11 DIAGNOSIS — Z01812 Encounter for preprocedural laboratory examination: Secondary | ICD-10-CM | POA: Insufficient documentation

## 2012-02-11 DIAGNOSIS — Z302 Encounter for sterilization: Secondary | ICD-10-CM | POA: Insufficient documentation

## 2012-02-11 DIAGNOSIS — Y838 Other surgical procedures as the cause of abnormal reaction of the patient, or of later complication, without mention of misadventure at the time of the procedure: Secondary | ICD-10-CM | POA: Insufficient documentation

## 2012-02-11 DIAGNOSIS — Z9851 Tubal ligation status: Secondary | ICD-10-CM

## 2012-02-11 DIAGNOSIS — I10 Essential (primary) hypertension: Secondary | ICD-10-CM | POA: Insufficient documentation

## 2012-02-11 SURGERY — SALPINGECTOMY, BILATERAL, LAPAROSCOPIC
Anesthesia: General | Laterality: Bilateral | Wound class: Clean Contaminated

## 2012-02-11 MED ORDER — ONDANSETRON HCL 4 MG/2ML IJ SOLN: 4.0000 mg | Freq: Once | INTRAMUSCULAR | Status: DC | PRN

## 2012-02-11 MED ORDER — ONDANSETRON HCL 8 MG PO TABS: 8.0000 mg | ORAL_TABLET | Freq: Three times a day (TID) | ORAL | Status: AC | PRN

## 2012-02-11 MED ORDER — FENTANYL CITRATE 0.05 MG/ML IJ SOLN
25.0000 ug | INTRAMUSCULAR | Status: DC | PRN
Start: 2012-02-11 — End: 2012-02-11
  Administered 2012-02-11: 25 ug via INTRAVENOUS

## 2012-02-11 MED ORDER — LABETALOL HCL 5 MG/ML IV SOLN
5.0000 mg | INTRAVENOUS | Status: DC | PRN
  Administered 2012-02-11: 5 mg via INTRAVENOUS

## 2012-02-11 MED ORDER — KETOROLAC TROMETHAMINE 10 MG PO TABS: 10.0000 mg | ORAL_TABLET | Freq: Three times a day (TID) | ORAL | Status: AC | PRN

## 2012-02-11 MED ORDER — CEFAZOLIN SODIUM-DEXTROSE 2-3 GM-% IV SOLR: 2.0000 g | Freq: Once | INTRAVENOUS | Status: DC

## 2012-02-11 MED ORDER — 0.9 % SODIUM CHLORIDE (POUR BTL) OPTIME
TOPICAL | Status: DC | PRN
  Administered 2012-02-11: 1000 mL

## 2012-02-11 MED ORDER — NEOSTIGMINE METHYLSULFATE 1 MG/ML IJ SOLN
INTRAMUSCULAR | Status: DC | PRN
  Administered 2012-02-11: 4 mg via INTRAVENOUS

## 2012-02-11 MED ORDER — MIDAZOLAM HCL 2 MG/2ML IJ SOLN
1.0000 mg | INTRAMUSCULAR | Status: DC | PRN
  Administered 2012-02-11: 2 mg via INTRAVENOUS

## 2012-02-11 MED ORDER — GLYCOPYRROLATE 0.2 MG/ML IJ SOLN
INTRAMUSCULAR | Status: DC | PRN
  Administered 2012-02-11: .8 mg via INTRAVENOUS

## 2012-02-11 MED ORDER — LACTATED RINGERS IV SOLN
INTRAVENOUS | Status: DC
  Administered 2012-02-11: 12:00:00 via INTRAVENOUS
  Administered 2012-02-11: 1000 mL via INTRAVENOUS

## 2012-02-11 MED ORDER — ONDANSETRON HCL 4 MG/2ML IJ SOLN
4.0000 mg | Freq: Once | INTRAMUSCULAR | Status: AC
  Administered 2012-02-11: 4 mg via INTRAVENOUS

## 2012-02-11 MED ORDER — PROPOFOL 10 MG/ML IV EMUL
INTRAVENOUS | Status: AC
  Filled 2012-02-11: qty 20

## 2012-02-11 MED ORDER — FENTANYL CITRATE 0.05 MG/ML IJ SOLN
INTRAMUSCULAR | Status: DC | PRN
  Administered 2012-02-11 (×4): 50 ug via INTRAVENOUS

## 2012-02-11 MED ORDER — LIDOCAINE HCL (PF) 1 % IJ SOLN
INTRAMUSCULAR | Status: AC
  Filled 2012-02-11: qty 5

## 2012-02-11 MED ORDER — CEFAZOLIN SODIUM 1-5 GM-% IV SOLN: 1.0000 g | INTRAVENOUS | Status: DC

## 2012-02-11 MED ORDER — CEFAZOLIN SODIUM-DEXTROSE 2-3 GM-% IV SOLR
INTRAVENOUS | Status: AC
  Filled 2012-02-11: qty 50

## 2012-02-11 MED ORDER — KETOROLAC TROMETHAMINE 30 MG/ML IJ SOLN
30.0000 mg | Freq: Once | INTRAMUSCULAR | Status: AC
  Administered 2012-02-11: 30 mg via INTRAVENOUS

## 2012-02-11 MED ORDER — PROPOFOL 10 MG/ML IV BOLUS
INTRAVENOUS | Status: DC | PRN
  Administered 2012-02-11: 175 mg via INTRAVENOUS

## 2012-02-11 MED ORDER — KETOROLAC TROMETHAMINE 30 MG/ML IJ SOLN
INTRAMUSCULAR | Status: AC
  Filled 2012-02-11: qty 1

## 2012-02-11 MED ORDER — FENTANYL CITRATE 0.05 MG/ML IJ SOLN
INTRAMUSCULAR | Status: AC
  Filled 2012-02-11: qty 2

## 2012-02-11 MED ORDER — LABETALOL HCL 5 MG/ML IV SOLN
INTRAVENOUS | Status: DC | PRN
  Administered 2012-02-11 (×4): 5 mg via INTRAVENOUS

## 2012-02-11 MED ORDER — LABETALOL HCL 5 MG/ML IV SOLN
INTRAVENOUS | Status: AC
  Administered 2012-02-11: 5 mg via INTRAVENOUS
  Filled 2012-02-11: qty 4

## 2012-02-11 MED ORDER — FENTANYL CITRATE 0.05 MG/ML IJ SOLN
INTRAMUSCULAR | Status: AC
  Administered 2012-02-11: 25 ug via INTRAVENOUS
  Filled 2012-02-11: qty 5

## 2012-02-11 MED ORDER — HYDROCODONE-ACETAMINOPHEN 5-500 MG PO TABS: 1.0000 | ORAL_TABLET | Freq: Four times a day (QID) | ORAL | Status: AC | PRN

## 2012-02-11 MED ORDER — LIDOCAINE HCL 1 % IJ SOLN
INTRAMUSCULAR | Status: DC | PRN
  Administered 2012-02-11: 40 mg via INTRADERMAL

## 2012-02-11 MED ORDER — ROCURONIUM BROMIDE 100 MG/10ML IV SOLN
INTRAVENOUS | Status: DC | PRN
  Administered 2012-02-11: 35 mg via INTRAVENOUS
  Administered 2012-02-11: 5 mg via INTRAVENOUS

## 2012-02-11 MED ORDER — ROCURONIUM BROMIDE 50 MG/5ML IV SOLN
INTRAVENOUS | Status: AC
  Filled 2012-02-11: qty 1

## 2012-02-11 MED ORDER — ONDANSETRON HCL 4 MG/2ML IJ SOLN
INTRAMUSCULAR | Status: AC
  Administered 2012-02-11: 4 mg via INTRAVENOUS
  Filled 2012-02-11: qty 2

## 2012-02-11 MED ORDER — GLYCOPYRROLATE 0.2 MG/ML IJ SOLN
INTRAMUSCULAR | Status: AC
  Filled 2012-02-11: qty 4

## 2012-02-11 MED ORDER — MIDAZOLAM HCL 2 MG/2ML IJ SOLN
INTRAMUSCULAR | Status: AC
  Filled 2012-02-11: qty 2

## 2012-02-11 SURGICAL SUPPLY — 47 items
BAG HAMPER (MISCELLANEOUS) ×2 IMPLANT
BAG SPEC RTRVL LRG 6X4 10 (ENDOMECHANICALS)
BLADE MORCELLATOR LAPAROSCOPIC (BLADE) IMPLANT
BLADE SURG SZ11 CARB STEEL (BLADE) ×2 IMPLANT
CLOTH BEACON ORANGE TIMEOUT ST (SAFETY) ×2 IMPLANT
COVER SURGICAL LIGHT HANDLE (MISCELLANEOUS) ×4 IMPLANT
DISSECTOR BLUNT TIP ENDO 5MM (MISCELLANEOUS) IMPLANT
ELECT REM PT RETURN 9FT ADLT (ELECTROSURGICAL) ×2
ELECTRODE REM PT RTRN 9FT ADLT (ELECTROSURGICAL) ×1 IMPLANT
FILTER SMOKE EVAC LAPAROSHD (FILTER) ×3 IMPLANT
FORMALIN 10 PREFIL 480ML (MISCELLANEOUS) ×2 IMPLANT
GAUZE PACKING IODOFORM 1 (PACKING) IMPLANT
GAUZE SPONGE 4X4 16PLY XRAY LF (GAUZE/BANDAGES/DRESSINGS) IMPLANT
GLOVE BIOGEL PI IND STRL 8 (GLOVE) ×1 IMPLANT
GLOVE BIOGEL PI INDICATOR 8 (GLOVE) ×1
GLOVE ECLIPSE 6.5 STRL STRAW (GLOVE) ×1 IMPLANT
GLOVE ECLIPSE 7.0 STRL STRAW (GLOVE) ×2 IMPLANT
GLOVE ECLIPSE 8.0 STRL XLNG CF (GLOVE) ×2 IMPLANT
GLOVE EXAM NITRILE MD LF STRL (GLOVE) ×1 IMPLANT
GLOVE INDICATOR 7.0 STRL GRN (GLOVE) ×1 IMPLANT
GOWN SRG XL XLNG 56XLVL 4 (GOWN DISPOSABLE) ×1 IMPLANT
GOWN STRL NON-REIN XL XLG LVL4 (GOWN DISPOSABLE) ×2
GOWN STRL REIN XL XLG (GOWN DISPOSABLE) ×3 IMPLANT
HAND ACTIVATED (MISCELLANEOUS) ×1 IMPLANT
INST SET LAPROSCOPIC GYN AP (KITS) ×2 IMPLANT
IV NS IRRIG 3000ML ARTHROMATIC (IV SOLUTION) ×1 IMPLANT
KIT ROOM TURNOVER APOR (KITS) ×2 IMPLANT
KIT TROCAR LAP GYN (TROCAR) ×2 IMPLANT
MANIFOLD NEPTUNE II (INSTRUMENTS) ×2 IMPLANT
PACK PERI GYN (CUSTOM PROCEDURE TRAY) ×2 IMPLANT
PAD ARMBOARD 7.5X6 YLW CONV (MISCELLANEOUS) ×3 IMPLANT
POUCH SPECIMEN RETRIEVAL 10MM (ENDOMECHANICALS) IMPLANT
SCALPEL HARMONIC ACE (MISCELLANEOUS) ×2 IMPLANT
SET BASIN LINEN APH (SET/KITS/TRAYS/PACK) ×2 IMPLANT
SET TUBE IRRIG SUCTION NO TIP (IRRIGATION / IRRIGATOR) ×1 IMPLANT
SOLUTION ANTI FOG 6CC (MISCELLANEOUS) ×2 IMPLANT
SPONGE GAUZE 2X2 8PLY STRL LF (GAUZE/BANDAGES/DRESSINGS) ×3 IMPLANT
SPONGE GAUZE 4X4 12PLY (GAUZE/BANDAGES/DRESSINGS) ×1 IMPLANT
STAPLER VISISTAT 35W (STAPLE) ×2 IMPLANT
SUT VIC AB 2-0 CT2 27 (SUTURE) ×1 IMPLANT
SUT VIC AB 3-0 SH 27 (SUTURE)
SUT VIC AB 3-0 SH 27X BRD (SUTURE) ×1 IMPLANT
SUT VICRYL 0 UR6 27IN ABS (SUTURE) ×2 IMPLANT
TAPE CLOTH SURG 4X10 WHT LF (GAUZE/BANDAGES/DRESSINGS) ×1 IMPLANT
TRAY FOLEY CATH 14FR (SET/KITS/TRAYS/PACK) ×2 IMPLANT
TUBING HI FLO HEAT INSUFFLATOR (IRRIGATION / IRRIGATOR) ×2 IMPLANT
WARMER LAPAROSCOPE (MISCELLANEOUS) ×2 IMPLANT

## 2012-02-11 NOTE — Anesthesia Preprocedure Evaluation (Signed)
Anesthesia Evaluation  Patient identified by MRN, date of birth, ID band Patient awake    Reviewed: Allergy & Precautions, H&P , NPO status , Patient's Chart, lab work & pertinent test results  Airway Mallampati: II TM Distance: >3 FB     Dental  (+) Teeth Intact   Pulmonary  breath sounds clear to auscultation        Cardiovascular hypertension, Pt. on medications Rhythm:Regular     Neuro/Psych    GI/Hepatic   Endo/Other  Morbid obesity  Renal/GU      Musculoskeletal   Abdominal   Peds  Hematology   Anesthesia Other Findings   Reproductive/Obstetrics                           Anesthesia Physical Anesthesia Plan  ASA: II  Anesthesia Plan: General   Post-op Pain Management:    Induction: Intravenous  Airway Management Planned: Oral ETT  Additional Equipment:   Intra-op Plan:   Post-operative Plan: Extubation in OR  Informed Consent: I have reviewed the patients History and Physical, chart, labs and discussed the procedure including the risks, benefits and alternatives for the proposed anesthesia with the patient or authorized representative who has indicated his/her understanding and acceptance.     Plan Discussed with:   Anesthesia Plan Comments:         Anesthesia Quick Evaluation

## 2012-02-11 NOTE — Op Note (Signed)
Preoperative Diagnosis:  Multiparous female desires permanent sterilization, failure previous Filshie clip placement  Postoperative Diagnosis:  Same as above  Procedure:  Laparoscopic Bilateral Salpingectomy  Surgeon:  Rockne Coons MD  Anaesthesia:  LMA  Findings:  Patient had normal pelvic anatomy and no intraperitoneal abnormalities.  Description of Operation:  Patient was taken to the OR and placed into supine position where she underwent LMA anaesthesia.  She was placed in the dorsal lithotomy position and prepped and draped in the usual sterile fashion.  An incision was made in the umbilicus and dissection taken down to the rectus fascia which was incised and opened.  The non bladed trocar was then placed and the peritoneal cavity was insufflated.  The above noted findings were observed.  Additional trocars were placed in the LLQ and midline suprapubic area without difficulty.  Harmonic scalpel was used and both tubes were removed in total.  The right Filshie clip was free in the pelvis.  The left had transected the left tube and was in place.  There was good hemostasis.  The fascia, peritoneum and subcutaneous tissue were closed using 0 vicryl.  The skin was closed using staples.  The patient was awakened from anaesthesia and taken to the PACU with all counts being correct x 3.  The patient received Ancef 1 gram and Toradol 30 mg IV preoperatively.  Tiffanni Scarfo H 02/11/2012 11:41 AM

## 2012-02-11 NOTE — Anesthesia Postprocedure Evaluation (Signed)
  Anesthesia Post-op Note  Patient: [name]  Procedure(s) Performed: Procedure(s) (LRB): LAPAROSCOPIC BILATERAL SALPINGECTOMY (Bilateral)  Patient Location: PACU  Anesthesia Type: General  Level of Consciousness: awake, alert , oriented and patient cooperative  Airway and Oxygen Therapy: Patient Spontanous Breathing and Patient connected to face mask oxygen  Post-op Pain: mild  Post-op Assessment: Post-op Vital signs reviewed, Patient's Cardiovascular Status Stable, Respiratory Function Stable and Patent Airway  Post-op Vital Signs: Reviewed and stable  Complications: No apparent anesthesia complications

## 2012-02-11 NOTE — OR Nursing (Signed)
Dr Jayme Cloud notified O2 sat 89-91%.  OK to discharge.  Order given to instruct patient to use incentive spirometry every hour while awake for the next two days then twice a day as needed.

## 2012-02-11 NOTE — H&P (Signed)
[name] is an 41 y.o. female.   OB History    Grav Para Term Preterm Abortions TAB SAB Ect Mult Living   6 4  4 1  1   4      status post post partum tubal ligation after her previous pregnancy which failed.  She delivered her next child and now wants bilateral salpingectomy.    Past Medical History  Diagnosis Date  . Obesity   . Hypertension   . Fibroid     Past Surgical History  Procedure Date  . Bilateral breast reduction 2004  . Appendectomy   . Cholecystectomy   . Removal of left ovarian cyst 2003  . Btl 2011  . Tubal ligation 2011    Women's    Family History  Problem Relation Age of Onset  . Coronary artery disease Mother   . Hypertension Mother   . Coronary artery disease Father   . Kidney disease Father   . Hypertension Father   . Hypertension Sister   . Anesthesia problems Neg Hx   . Hypotension Neg Hx   . Malignant hyperthermia Neg Hx   . Pseudochol deficiency Neg Hx     Social History:  reports that she has never smoked. She has never used smokeless tobacco. She reports that she drinks alcohol. She reports that she does not use illicit drugs.  Allergies: No Known Allergies  Prescriptions prior to admission  Medication Sig Dispense Refill  . acetaminophen (TYLENOL) 325 MG tablet Take 650 mg by mouth every 6 (six) hours as needed. As needed for pain      . cloNIDine (CATAPRES) 0.3 MG tablet Take 0.3 mg by mouth 2 (two) times daily.      . Prenatal Vit-Fe Fumarate-FA (PRENATAL MULTIVITAMIN) TABS Take 1 tablet by mouth daily.      Marland Kitchen triamterene-hydrochlorothiazide (MAXZIDE) 75-50 MG per tablet Take 1 tablet by mouth daily.        ROS  Review of Systems  Constitutional: Negative for fever, chills, weight loss, malaise/fatigue and diaphoresis.  HENT: Negative for hearing loss, ear pain, nosebleeds, congestion, sore throat, neck pain, tinnitus and ear discharge.   Eyes: Negative for blurred vision, double vision, photophobia, pain, discharge and  redness.  Respiratory: Negative for cough, hemoptysis, sputum production, shortness of breath, wheezing and stridor.   Cardiovascular: Negative for chest pain, palpitations, orthopnea, claudication, leg swelling and PND.  Gastrointestinal: Negative for abdominal pain. Negative for heartburn, nausea, vomiting, diarrhea, constipation, blood in stool and melena.  Genitourinary: Negative for dysuria, urgency, frequency, hematuria and flank pain.  Musculoskeletal: Negative for myalgias, back pain, joint pain and falls.  Skin: Negative for itching and rash.  Neurological: Negative for dizziness, tingling, tremors, sensory change, speech change, focal weakness, seizures, loss of consciousness, weakness and headaches.  Endo/Heme/Allergies: Negative for environmental allergies and polydipsia. Does not bruise/bleed easily.  Psychiatric/Behavioral: Negative for depression, suicidal ideas, hallucinations, memory loss and substance abuse. The patient is not nervous/anxious and does not have insomnia.      Blood pressure 135/93, pulse 83, temperature 98 F (36.7 C), temperature source Oral, resp. rate 16, last menstrual period 01/17/2012, SpO2 98.00%, not currently breastfeeding. Physical Exam Physical Exam  Vitals reviewed. Constitutional: She is oriented to person, place, and time. She appears well-developed and well-nourished.  HENT:  Head: Normocephalic and atraumatic.  Right Ear: External ear normal.  Left Ear: External ear normal.  Nose: Nose normal.  Mouth/Throat: Oropharynx is clear and moist.  Eyes: Conjunctivae and EOM  are normal. Pupils are equal, round, and reactive to light. Right eye exhibits no discharge. Left eye exhibits no discharge. No scleral icterus.  Neck: Normal range of motion. Neck supple. No tracheal deviation present. No thyromegaly present.  Cardiovascular: Normal rate, regular rhythm, normal heart sounds and intact distal pulses.  Exam reveals no gallop and no friction rub.    No murmur heard. Respiratory: Effort normal and breath sounds normal. No respiratory distress. She has no wheezes. She has no rales. She exhibits no tenderness.  GI: Soft. Bowel sounds are normal. She exhibits no distension and no mass. There is tenderness. There is no rebound and no guarding.  Genitourinary:       Vulva is normal without lesions Vagina is pink moist without discharge Cervix normal in appearance and pap is normal Uterus is enlarged to 6-8 weeks size due to multiple fibroids  Adnexa is negative with normal sized ovaries by sonogram  Musculoskeletal: Normal range of motion. She exhibits no edema and no tenderness.  Neurological: She is alert and oriented to person, place, and time. She has normal reflexes. She displays normal reflexes. No cranial nerve deficit. She exhibits normal muscle tone. Coordination normal.  Skin: Skin is warm and dry. No rash noted. No erythema. No pallor.  Psychiatric: She has a normal mood and affect. Her behavior is normal. Judgment and thought content normal.     Recent Results (from the past 336 hour(s))  URINALYSIS, ROUTINE W REFLEX MICROSCOPIC   Collection Time   02/03/12  9:18 AM      Component Value Range   Color, Urine YELLOW  YELLOW    APPearance CLEAR  CLEAR    Specific Gravity, Urine 1.025  1.005 - 1.030    pH 6.0  5.0 - 8.0    Glucose, UA NEGATIVE  NEGATIVE (mg/dL)   Hgb urine dipstick TRACE (*) NEGATIVE    Bilirubin Urine NEGATIVE  NEGATIVE    Ketones, ur NEGATIVE  NEGATIVE (mg/dL)   Protein, ur NEGATIVE  NEGATIVE (mg/dL)   Urobilinogen, UA 1.0  0.0 - 1.0 (mg/dL)   Nitrite NEGATIVE  NEGATIVE    Leukocytes, UA TRACE (*) NEGATIVE   URINE MICROSCOPIC-ADD ON   Collection Time   02/03/12  9:18 AM      Component Value Range   Squamous Epithelial / LPF MANY (*) RARE    WBC, UA 3-6  <3 (WBC/hpf)   RBC / HPF 3-6  <3 (RBC/hpf)   Bacteria, UA MANY (*) RARE   SURGICAL PCR SCREEN   Collection Time   02/03/12  9:30 AM      Component  Value Range   MRSA, PCR NEGATIVE  NEGATIVE    Staphylococcus aureus NEGATIVE  NEGATIVE   CBC   Collection Time   02/03/12  9:30 AM      Component Value Range   WBC 7.8  4.0 - 10.5 (K/uL)   RBC 3.76 (*) 3.87 - 5.11 (MIL/uL)   Hemoglobin 10.7 (*) 12.0 - 15.0 (g/dL)   HCT 13.2 (*) 44.0 - 46.0 (%)   MCV 88.3  78.0 - 100.0 (fL)   MCH 28.5  26.0 - 34.0 (pg)   MCHC 32.2  30.0 - 36.0 (g/dL)   RDW 10.2  72.5 - 36.6 (%)   Platelets 271  150 - 400 (K/uL)  COMPREHENSIVE METABOLIC PANEL   Collection Time   02/03/12  9:30 AM      Component Value Range   Sodium 138  135 - 145 (mEq/L)  Potassium 3.3 (*) 3.5 - 5.1 (mEq/L)   Chloride 105  96 - 112 (mEq/L)   CO2 25  19 - 32 (mEq/L)   Glucose, Bld 100 (*) 70 - 99 (mg/dL)   BUN 13  6 - 23 (mg/dL)   Creatinine, Ser 4.09  0.50 - 1.10 (mg/dL)   Calcium 8.9  8.4 - 81.1 (mg/dL)   Total Protein 6.3  6.0 - 8.3 (g/dL)   Albumin 3.2 (*) 3.5 - 5.2 (g/dL)   AST 26  0 - 37 (U/L)   ALT 25  0 - 35 (U/L)   Alkaline Phosphatase 111  39 - 117 (U/L)   Total Bilirubin 0.7  0.3 - 1.2 (mg/dL)   GFR calc non Af Amer >90  >90 (mL/min)   GFR calc Af Amer >90  >90 (mL/min)  HCG, QUANTITATIVE, PREGNANCY   Collection Time   02/03/12  9:30 AM      Component Value Range   hCG, Beta Chain, Quant, S 1  <5 (mIU/mL)     No results found.  Assessment/Plan: 1.  Multiparous female desires bilateral salpingectomy for permanent sterilization  Saren Corkern H 02/11/2012, 10:00 AM

## 2012-02-11 NOTE — Anesthesia Procedure Notes (Signed)
Procedure Name: Intubation Date/Time: 02/11/2012 10:41 AM Performed by: Glynn Octave E Pre-anesthesia Checklist: Patient identified, Patient being monitored, Timeout performed, Emergency Drugs available and Suction available Patient Re-evaluated:Patient Re-evaluated prior to inductionOxygen Delivery Method: Circle System Utilized Preoxygenation: Pre-oxygenation with 100% oxygen Intubation Type: IV induction Ventilation: Mask ventilation without difficulty Laryngoscope Size: Mac and 3 Grade View: Grade I Tube type: Oral Tube size: 7.0 mm Number of attempts: 1 Airway Equipment and Method: stylet Placement Confirmation: ETT inserted through vocal cords under direct vision,  positive ETCO2 and breath sounds checked- equal and bilateral Secured at: 21 cm Tube secured with: Tape Dental Injury: Teeth and Oropharynx as per pre-operative assessment

## 2012-02-11 NOTE — Transfer of Care (Signed)
Immediate Anesthesia Transfer of Care Note  Patient: [name]  Procedure(s) Performed: Procedure(s) (LRB): LAPAROSCOPIC BILATERAL SALPINGECTOMY (Bilateral)  Patient Location: PACU  Anesthesia Type: General  Level of Consciousness: awake, alert , oriented and patient cooperative  Airway & Oxygen Therapy: Patient Spontanous Breathing and Patient connected to face mask oxygen  Post-op Assessment: Report given to PACU RN and Post -op Vital signs reviewed and stable  Post vital signs: Reviewed and stable  Complications: No apparent anesthesia complications

## 2012-02-11 NOTE — Discharge Instructions (Signed)
.  Laparoscopic Tubal Ligation Care After Laparoscopic tubal ligation is an operation done with a long, lighted tube inserted through a small cut (incision) in the abdomen. The fallopian tubes are blocked by tying, clamping with a plastic clamp, or burning them closed with an electrocautery. Read the instructions outlined below and refer to this sheet in the next few weeks. These instructions provide you with general information on caring for yourself after you leave the hospital. Your caregiver may also give you specific instructions. While your treatment has been planned according to the most current medical practices available, unavoidable complications may occur. If you have any problems or questions after discharge, please call your caregiver. HOME CARE INSTRUCTIONS   It will be normal to be sore for a couple days following surgery.   Take your medicine and follow the instructions from your caregiver.   You may resume usual diet, exercise, driving and activities as allowed by your caregiver.   Do not have sexual intercourse until your caregiver gives you permission.   Do not drive while taking pain medicine.   Avoid lifting until you are instructed otherwise.   Use showers for bathing, until you are seen by your caregiver.   Change dressings if needed, and as directed.   Only take over-the-counter or prescription medicines for pain, discomfort or fever as directed by your caregiver.   Do not take aspirin because it can cause bleeding.   Take your temperature twice a day and record it.   Have someone stay with you the day you have the operation, and for a couple days afterward.   Make an appointment to see your caregiver for stitches (sutures) or staple removal and postoperative exams, as instructed.  SEEK MEDICAL CARE IF:   There is redness, swelling, or increasing pain in a wound.   There is drainage from a wound lasting longer than one day.   Your pain is getting worse.    You develop a rash.   You are having a reaction to your medicine.   You become dizzy or lightheaded.   You need stronger medicine or a change in your pain medicine.   You notice a foul smell coming from a wound or dressing.   There is a breaking open of a wound after the stitches, staples, or skin adhesive strips have been removed.   You develop constipation.  SEEK IMMEDIATE MEDICAL CARE IF:   You have an oral temperature above 102 F (38.9 C), not controlled by medicine.   There is increasing abdominal pain.   You develop pain in your shoulders (shoulder strap areas) which becomes more severe. Some pain is common, because of the gas inserted into your abdomen during the procedure.   You develop bleeding or drainage from the suture sites or vagina (birth canal) following surgery.   You pass out.   You develop shortness of breath or difficulty breathing.   You develop chest or leg pain.   You develop persistent nausea, vomiting or diarrhea.  MAKE SURE YOU:   Understand these instructions.   Watch your condition.   Get help right away if you are not doing well or get worse.  Document Released: 04/18/2005 Document Revised: 09/18/2011 Document Reviewed: 10/15/2009 [facility] Patient Information 2012 [place], [facility]   Use incentive Spirometry every hour while awake for two days the 2 times a day per Dr Jayme Cloud

## 2012-02-12 NOTE — Addendum Note (Signed)
Addendum  created 02/12/12 0745 by Franco Nones, CRNA   Modules edited:Charges VN

## 2012-03-18 ENCOUNTER — Encounter: Payer: Self-pay | Admitting: Family Medicine

## 2012-03-18 ENCOUNTER — Ambulatory Visit (INDEPENDENT_AMBULATORY_CARE_PROVIDER_SITE_OTHER): Payer: Medicaid Other | Admitting: Family Medicine

## 2012-03-18 ENCOUNTER — Other Ambulatory Visit: Payer: Self-pay | Admitting: Family Medicine

## 2012-03-18 VITALS — BP 140/84 | HR 93 | Resp 16 | Ht 64.0 in | Wt 272.0 lb

## 2012-03-18 DIAGNOSIS — R5383 Other fatigue: Secondary | ICD-10-CM

## 2012-03-18 DIAGNOSIS — L0390 Cellulitis, unspecified: Secondary | ICD-10-CM | POA: Insufficient documentation

## 2012-03-18 DIAGNOSIS — I10 Essential (primary) hypertension: Secondary | ICD-10-CM

## 2012-03-18 DIAGNOSIS — E669 Obesity, unspecified: Secondary | ICD-10-CM

## 2012-03-18 DIAGNOSIS — R5381 Other malaise: Secondary | ICD-10-CM

## 2012-03-18 DIAGNOSIS — Z139 Encounter for screening, unspecified: Secondary | ICD-10-CM

## 2012-03-18 DIAGNOSIS — R0789 Other chest pain: Secondary | ICD-10-CM

## 2012-03-18 DIAGNOSIS — L0291 Cutaneous abscess, unspecified: Secondary | ICD-10-CM

## 2012-03-18 DIAGNOSIS — R071 Chest pain on breathing: Secondary | ICD-10-CM

## 2012-03-18 DIAGNOSIS — E559 Vitamin D deficiency, unspecified: Secondary | ICD-10-CM

## 2012-03-18 LAB — CBC WITH DIFFERENTIAL/PLATELET
Basophils Absolute: 0.1 10*3/uL (ref 0.0–0.1)
Basophils Relative: 1 % (ref 0–1)
Eosinophils Absolute: 0.3 10*3/uL (ref 0.0–0.7)
Eosinophils Relative: 3 % (ref 0–5)
HCT: 34.2 % — ABNORMAL LOW (ref 36.0–46.0)
Hemoglobin: 10.7 g/dL — ABNORMAL LOW (ref 12.0–15.0)
Lymphocytes Relative: 19 % (ref 12–46)
Lymphs Abs: 1.7 10*3/uL (ref 0.7–4.0)
MCH: 26.5 pg (ref 26.0–34.0)
MCHC: 31.3 g/dL (ref 30.0–36.0)
MCV: 84.7 fL (ref 78.0–100.0)
Monocytes Absolute: 0.6 10*3/uL (ref 0.1–1.0)
Monocytes Relative: 7 % (ref 3–12)
Neutro Abs: 6.1 10*3/uL (ref 1.7–7.7)
Neutrophils Relative %: 70 % (ref 43–77)
Platelets: 278 10*3/uL (ref 150–400)
RBC: 4.04 MIL/uL (ref 3.87–5.11)
RDW: 15.6 % — ABNORMAL HIGH (ref 11.5–15.5)
WBC: 8.6 10*3/uL (ref 4.0–10.5)

## 2012-03-18 LAB — LIPID PANEL
Cholesterol: 161 mg/dL (ref 0–200)
HDL: 49 mg/dL (ref >39)
LDL Cholesterol: 96 mg/dL (ref 0–99)
Total CHOL/HDL Ratio: 3.3 Ratio
Triglycerides: 80 mg/dL (ref <150)
VLDL: 16 mg/dL (ref 0–40)

## 2012-03-18 LAB — TSH: TSH: 1.45 u[IU]/mL (ref 0.350–4.500)

## 2012-03-18 LAB — BASIC METABOLIC PANEL
BUN: 15 mg/dL (ref 6–23)
CO2: 25 mEq/L (ref 19–32)
Calcium: 8.9 mg/dL (ref 8.4–10.5)
Chloride: 107 mEq/L (ref 96–112)
Creat: 0.76 mg/dL (ref 0.50–1.10)
Glucose, Bld: 101 mg/dL — ABNORMAL HIGH (ref 70–99)
Potassium: 3.7 mEq/L (ref 3.5–5.3)
Sodium: 140 mEq/L (ref 135–145)

## 2012-03-18 LAB — HEMOGLOBIN A1C
Hgb A1c MFr Bld: 5.3 % (ref <5.7)
Mean Plasma Glucose: 105 mg/dL (ref <117)

## 2012-03-18 MED ORDER — IBUPROFEN 800 MG PO TABS: 800.0000 mg | ORAL_TABLET | Freq: Three times a day (TID) | ORAL | Status: AC | PRN

## 2012-03-18 MED ORDER — DOXYCYCLINE HYCLATE 100 MG PO TABS: 100.0000 mg | ORAL_TABLET | Freq: Two times a day (BID) | ORAL | Status: AC

## 2012-03-18 MED ORDER — AMLODIPINE BESYLATE 2.5 MG PO TABS: 2.5000 mg | ORAL_TABLET | Freq: Every day | ORAL | Status: DC

## 2012-03-18 NOTE — Progress Notes (Signed)
  Subjective:    Patient ID: [name], female    DOB: 1971/08/26, 41 y.o.   MRN: [id number]  HPI The PT is here for follow up and re-evaluation of chronic medical conditions, medication management and review of any available recent lab and radiology data.She is 32 months post partum with her 4th child. She had her 3rd pregnancy after she had surgical sterilization with tubes tied, states she recently had them removed. Is concerned about malodorous d/c from area and piece of flesh sticking out in the area . She also has a 3 day h/o reproducible right chest wall pain, aggravated by raising the right shoulder, unable to recall any inciting trauma Preventive health is updated, specifically  Cancer screening and Immunization.   Questions or concerns regarding consultations or procedures which the PT has had in the interim are  addressed. The PT denies any adverse reactions to current medications since the last visit.      Review of Systems See HPI Denies recent fever or chills. Denies sinus pressure, nasal congestion, ear pain or sore throat. Denies chest congestion, productive cough or wheezing. Denies c palpitations and leg swelling Denies abdominal pain, nausea, vomiting,diarrhea or constipation.   Denies dysuria, frequency, hesitancy or incontinence. Denies joint pain, swelling and limitation in mobility. Denies headaches, seizures, numbness, or tingling. Denies depression, anxiety or insomnia. h.       Objective:   Physical Exam Patient alert and oriented and in no cardiopulmonary distress.  HEENT: No facial asymmetry, EOMI, no sinus tenderness,  oropharynx pink and moist.  Neck supple no adenopathy.  Chest: Clear to auscultation bilaterally.Reproducible pain at 3rd right CC jumntion  CVS: S1, S2 no murmurs, no S3.  ABD: Soft non tender. Bowel sounds normal.  Ext: No edema  MS: Adequate ROM spine, shoulders, hips and knees.  Skin: Intact, mild rash and odor at  laparoscopy incision site, with skin tag protruding from site  Psych: Good eye contact, normal affect. Memory intact not anxious or depressed appearing.  CNS: CN 2-12 intact, power, tone and sensation normal throughout.        Assessment & Plan:

## 2012-03-18 NOTE — Patient Instructions (Addendum)
F/u in 5 weeks.  New additional medication for your blood pressure.Continue what you are already taking  Medication sent in for mild infection at navel and you are referred to Dr Despina Hidden  Medication is sent in for chest wall pain  It is important that you exercise regularly at least 30 minutes 5 times a week. If you develop chest pain, have severe difficulty breathing, or feel very tired, stop exercising immediately and seek medical attention  A healthy diet is rich in fruit, vegetables and whole grains. Poultry fish, nuts and beans are a healthy choice for protein rather then red meat. A low sodium diet and drinking 64 ounces of water daily is generally recommended. Oils and sweet should be limited. Carbohydrates especially for those who are diabetic or overweight, should be limited to 34-45 gram per meal. It is important to eat on a regular schedule, at least 3 times daily. Snacks should be primarily fruits, vegetables or nuts.  Weight loss goal of 4 pounds per month  CBc, fasting chem 7, lipid, tSh and HBa1C, vit D  today

## 2012-03-19 DIAGNOSIS — E559 Vitamin D deficiency, unspecified: Secondary | ICD-10-CM | POA: Insufficient documentation

## 2012-03-19 LAB — FERRITIN: Ferritin: 8 ng/mL — ABNORMAL LOW (ref 10–291)

## 2012-03-19 LAB — IRON: Iron: 45 ug/dL (ref 42–145)

## 2012-03-19 LAB — VITAMIN D 25 HYDROXY (VIT D DEFICIENCY, FRACTURES): Vit D, 25-Hydroxy: 11 ng/mL — ABNORMAL LOW (ref 30–89)

## 2012-03-19 MED ORDER — ERGOCALCIFEROL 1.25 MG (50000 UT) PO CAPS: 50000.0000 [IU] | ORAL_CAPSULE | ORAL | Status: DC

## 2012-03-20 NOTE — Assessment & Plan Note (Signed)
Deteriorated. Patient re-educated about  the importance of commitment to a  minimum of 150 minutes of exercise per week. The importance of healthy food choices with portion control discussed. Encouraged to start a food diary, count calories and to consider  joining a support group. Sample diet sheets offered. Goals set by the patient for the next several months.    

## 2012-03-20 NOTE — Assessment & Plan Note (Signed)
Uncontrolled , additional med to be started 

## 2012-03-20 NOTE — Assessment & Plan Note (Signed)
Anti-inflammatory prescribed 

## 2012-03-20 NOTE — Assessment & Plan Note (Signed)
Importance of taking supplement to normalize level stressed

## 2012-03-20 NOTE — Assessment & Plan Note (Signed)
Malodorous discharge with skin tag in area of navel where pt had laparoscopic salpingectomy, antibiotic prescribed and referred to gyne

## 2012-04-22 ENCOUNTER — Ambulatory Visit: Payer: Medicaid Other | Admitting: Family Medicine

## 2012-08-28 ENCOUNTER — Encounter (HOSPITAL_COMMUNITY): Payer: Self-pay

## 2012-08-28 ENCOUNTER — Emergency Department (HOSPITAL_COMMUNITY)
Admission: EM | Admit: 2012-08-28 | Discharge: 2012-08-28 | Disposition: A | Discharge status: Home / Self Care | Payer: Self-pay | Attending: Emergency Medicine | Admitting: Emergency Medicine

## 2012-08-28 ENCOUNTER — Emergency Department (HOSPITAL_COMMUNITY): Payer: Self-pay

## 2012-08-28 DIAGNOSIS — E669 Obesity, unspecified: Secondary | ICD-10-CM | POA: Insufficient documentation

## 2012-08-28 DIAGNOSIS — I10 Essential (primary) hypertension: Secondary | ICD-10-CM | POA: Insufficient documentation

## 2012-08-28 DIAGNOSIS — Z8742 Personal history of other diseases of the female genital tract: Secondary | ICD-10-CM | POA: Insufficient documentation

## 2012-08-28 DIAGNOSIS — R51 Headache: Secondary | ICD-10-CM | POA: Insufficient documentation

## 2012-08-28 DIAGNOSIS — J40 Bronchitis, not specified as acute or chronic: Secondary | ICD-10-CM | POA: Insufficient documentation

## 2012-08-28 IMAGING — CR DG CHEST 1V PORT
1 series · 1 of 1 positions shown · non-contrast
Comparison: [DATE].

CLINICAL DATA: Cough. Shortness of breath..  Congestion.

PORTABLE CHEST - 1 VIEW

[view not recorded]
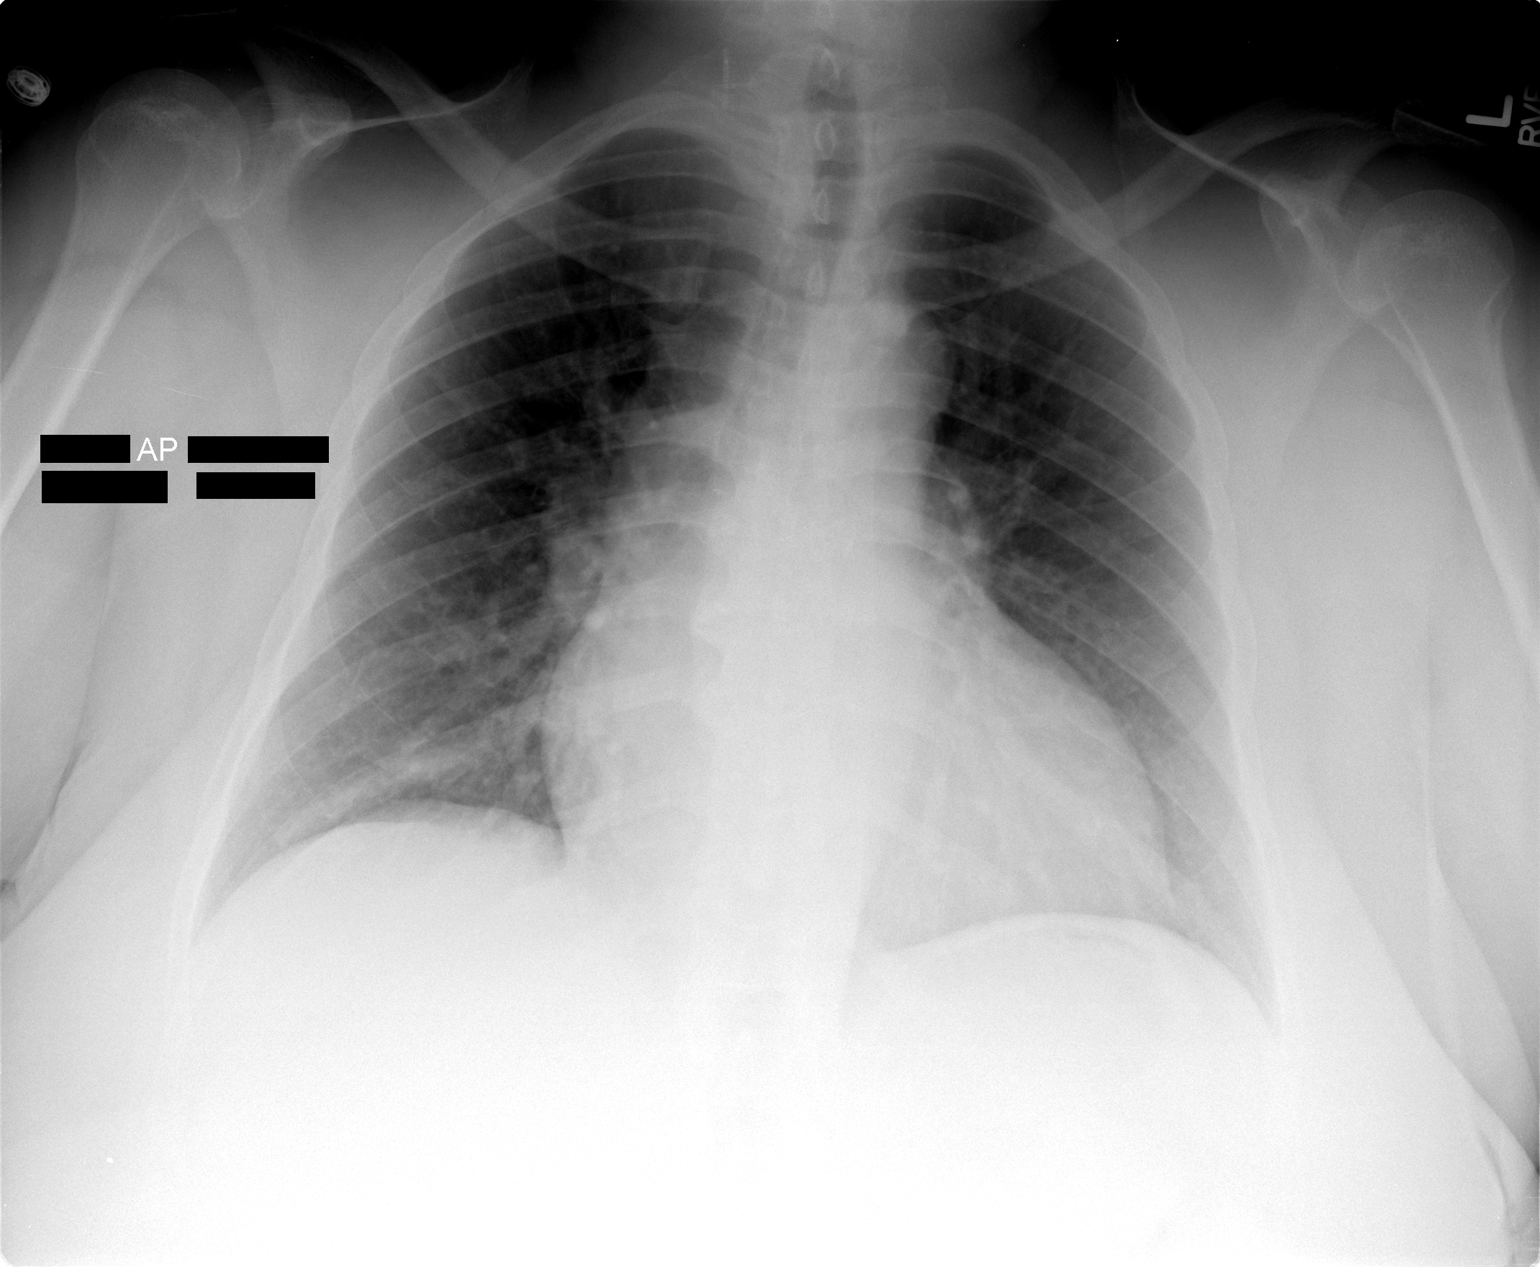

[1 of 1 positions shown; findings below may reference images not displayed]

FINDINGS: Cardiomegaly.  Central pulmonary vascular prominence.  No
segmental consolidation or gross pneumothorax.

Slightly tortuous aorta.
IMPRESSION: Cardiomegaly.

Central pulmonary vascular prominence.

No segmental consolidation or gross pneumothorax.

Slightly tortuous aorta.

## 2012-08-28 MED ORDER — LABETALOL HCL 5 MG/ML IV SOLN
20.0000 mg | Freq: Once | INTRAVENOUS | Status: AC
  Administered 2012-08-28: 20 mg via INTRAVENOUS
  Filled 2012-08-28: qty 4

## 2012-08-28 MED ORDER — AMOXICILLIN 500 MG PO CAPS: 500.0000 mg | ORAL_CAPSULE | Freq: Three times a day (TID) | ORAL | Status: DC

## 2012-08-28 NOTE — ED Notes (Signed)
Dr Zammit at bedside,  

## 2012-08-28 NOTE — ED Notes (Signed)
Pt c/o head cold and headache for the past few days, states that she took her blood pressure today and it was elevated, has had problems with controlling her blood pressure prior to today,

## 2012-08-28 NOTE — ED Notes (Signed)
edp in with pt 

## 2012-08-28 NOTE — ED Provider Notes (Signed)
History   This chart was scribed for Benny Lennert, MD scribed by Magnus Sinning. The patient was seen in room [facility]07 at 18:39   CSN: [id number]  Arrival date & time 08/28/12  1818    Chief Complaint  Patient presents with  . Nasal Congestion  . Headache  . Hypertension    (Consider location/radiation/quality/duration/timing/severity/associated sxs/prior treatment) HPI Comments: [name] is a 41 y.o. female who presents to the Emergency Department complaining of constant moderate "pounding" HA with associated cough and congestion, onset 4 days. She says she went to the store to pick up her HTN rx and that when she checked her BP she noticed it was very high at 245/143 at the store this evening.  Pt says she has been taking Clonidine 3.0 mg  twice a day, and Maxide 75-50 mg for treatment of HTN and that she  has maintained medication regiment.   Patient is a 41 y.o. female presenting with headaches and hypertension. The history is provided by the patient. No language interpreter was used.  Headache  This is a new problem. The current episode started more than 2 days ago. The problem occurs constantly. The problem has not changed since onset.The headache is associated with nothing. The quality of the pain is described as throbbing. The pain is moderate. The pain does not radiate.  Hypertension This is a new problem. The current episode started less than 1 hour ago. The problem has been gradually improving. Associated symptoms include headaches.    Past Medical History  Diagnosis Date  . Obesity   . Hypertension   . Fibroid     Past Surgical History  Procedure Date  . Bilateral breast reduction 2004  . Appendectomy   . Cholecystectomy   . Removal of left ovarian cyst 2003  . Btl 2011  . Tubal ligation 2011    Women's    Family History  Problem Relation Age of Onset  . Coronary artery disease Mother   . Hypertension Mother   . Coronary artery disease Father   .  Kidney disease Father   . Hypertension Father   . Hypertension Sister   . Anesthesia problems Neg Hx   . Hypotension Neg Hx   . Malignant hyperthermia Neg Hx   . Pseudochol deficiency Neg Hx     History  Substance Use Topics  . Smoking status: Never Smoker   . Smokeless tobacco: Never Used  . Alcohol Use: Yes     Comment: rarely    OB History    Grav Para Term Preterm Abortions TAB SAB Ect Mult Living   6 4  4 1  1   4       Review of Systems  Neurological: Positive for headaches.  All other systems reviewed and are negative.    Allergies  Review of patient's allergies indicates no known allergies.  Home Medications   Current Outpatient Rx  Name  Route  Sig  Dispense  Refill  . ACETAMINOPHEN 325 MG PO TABS   Oral   Take 650 mg by mouth every 6 (six) hours as needed. As needed for pain         . AMLODIPINE BESYLATE 2.5 MG PO TABS   Oral   Take 1 tablet (2.5 mg total) by mouth daily.   30 tablet   11   . CLONIDINE HCL 0.3 MG PO TABS   Oral   Take 0.3 mg by mouth 2 (two) times daily.         Marland Kitchen  ERGOCALCIFEROL 50000 UNITS PO CAPS   Oral   Take 1 capsule (50,000 Units total) by mouth once a week. One capsule once weekly   12 capsule   1   . PRENATAL MULTIVITAMIN CH   Oral   Take 1 tablet by mouth daily.         . TRIAMTERENE-HCTZ 75-50 MG PO TABS   Oral   Take 1 tablet by mouth daily.           BP 186/123  Pulse 88  Temp 97.4 F (36.3 C) (Oral)  Resp 22  Ht 5\' 4"  (1.626 m)  Wt 270 lb (122.471 kg)  BMI 46.35 kg/m2  SpO2 99%  LMP 08/17/2012  Physical Exam  Nursing note and vitals reviewed. Constitutional: She is oriented to person, place, and time. She appears well-developed.  HENT:  Head: Normocephalic and atraumatic.  Eyes: Conjunctivae normal and EOM are normal. No scleral icterus.  Neck: Neck supple. No thyromegaly present.  Cardiovascular: Normal rate and regular rhythm.  Exam reveals no gallop and no friction rub.   No murmur  heard. Pulmonary/Chest: No stridor. She has no wheezes. She has no rales. She exhibits no tenderness.  Abdominal: She exhibits no distension. There is no tenderness. There is no rebound.  Musculoskeletal: Normal range of motion. She exhibits no edema.  Lymphadenopathy:    She has no cervical adenopathy.  Neurological: She is alert and oriented to person, place, and time. Coordination normal.  Skin: Skin is warm and dry. No rash noted. No erythema.  Psychiatric: She has a normal mood and affect. Her behavior is normal.    ED Course  Procedures (including critical care time) DIAGNOSTIC STUDIES: Oxygen Saturation is 99% on room air, normal by my interpretation.    COORDINATION OF CARE:  Labs Reviewed - No data to display Dg Chest Portable 1 View  08/28/2012  *RADIOLOGY REPORT*  Clinical Data: Cough. Shortness of breath.  Congestion.  PORTABLE CHEST - 1 VIEW  Comparison: 09/20/2007.  Findings: Cardiomegaly.  Central pulmonary vascular prominence.  No segmental consolidation or gross pneumothorax.  Slightly tortuous aorta.  IMPRESSION:  Cardiomegaly.  Central pulmonary vascular prominence.  No segmental consolidation or gross pneumothorax.  Slightly tortuous aorta.   Original Report Authenticated By: Lacy Duverney, M.D.      No diagnosis found.    MDM   The chart was scribed for me under my direct supervision.  I personally performed the history, physical, and medical decision making and all procedures in the evaluation of this patient.Benny Lennert, MD 08/28/12 2011

## 2012-08-28 NOTE — ED Notes (Signed)
Pt reports being sick w/ cough, congestion and headache for last 3 days.   Checked bp today at drug store and was high.

## 2012-09-22 ENCOUNTER — Other Ambulatory Visit: Payer: Self-pay | Admitting: Family Medicine

## 2012-12-28 ENCOUNTER — Other Ambulatory Visit: Payer: Self-pay | Admitting: Family Medicine

## 2013-01-12 ENCOUNTER — Encounter: Payer: Self-pay | Admitting: Family Medicine

## 2013-01-12 ENCOUNTER — Ambulatory Visit (INDEPENDENT_AMBULATORY_CARE_PROVIDER_SITE_OTHER): Payer: BC Managed Care – PPO | Admitting: Family Medicine

## 2013-01-12 ENCOUNTER — Other Ambulatory Visit: Payer: Self-pay | Admitting: Family Medicine

## 2013-01-12 VITALS — BP 170/110 | HR 92 | Resp 18 | Ht 64.0 in | Wt 256.1 lb

## 2013-01-12 DIAGNOSIS — E559 Vitamin D deficiency, unspecified: Secondary | ICD-10-CM

## 2013-01-12 DIAGNOSIS — Z139 Encounter for screening, unspecified: Secondary | ICD-10-CM

## 2013-01-12 DIAGNOSIS — E669 Obesity, unspecified: Secondary | ICD-10-CM

## 2013-01-12 DIAGNOSIS — I10 Essential (primary) hypertension: Secondary | ICD-10-CM

## 2013-01-12 LAB — FERRITIN: Ferritin: 6 ng/mL — ABNORMAL LOW (ref 10–291)

## 2013-01-12 LAB — CBC
HCT: 37.1 % (ref 36.0–46.0)
Hemoglobin: 11.7 g/dL — ABNORMAL LOW (ref 12.0–15.0)
MCH: 25.3 pg — ABNORMAL LOW (ref 26.0–34.0)
MCHC: 31.5 g/dL (ref 30.0–36.0)
MCV: 80.3 fL (ref 78.0–100.0)
Platelets: 358 10*3/uL (ref 150–400)
RBC: 4.62 MIL/uL (ref 3.87–5.11)
RDW: 16 % — ABNORMAL HIGH (ref 11.5–15.5)
WBC: 8.8 10*3/uL (ref 4.0–10.5)

## 2013-01-12 LAB — BASIC METABOLIC PANEL
BUN: 16 mg/dL (ref 6–23)
CO2: 29 mEq/L (ref 19–32)
Calcium: 9.4 mg/dL (ref 8.4–10.5)
Chloride: 100 mEq/L (ref 96–112)
Creat: 0.71 mg/dL (ref 0.50–1.10)
Glucose, Bld: 91 mg/dL (ref 70–99)
Potassium: 3.7 mEq/L (ref 3.5–5.3)
Sodium: 139 mEq/L (ref 135–145)

## 2013-01-12 MED ORDER — ERGOCALCIFEROL 1.25 MG (50000 UT) PO CAPS: 50000.0000 [IU] | ORAL_CAPSULE | ORAL | Status: DC

## 2013-01-12 MED ORDER — AMLODIPINE BESYLATE 10 MG PO TABS: 10.0000 mg | ORAL_TABLET | Freq: Every day | ORAL | Status: DC

## 2013-01-12 NOTE — Patient Instructions (Addendum)
CPE in 2 month    cBC ,iron , chem 7 hBA1C today.  New additional medication for blood pressure, amlodipine 10mg  one daily, continue others  It is important that you exercise regularly at least 30 minutes 5 times a week. If you develop chest pain, have severe difficulty breathing, or feel very tired, stop exercising immediately and seek medical attention   Congrats on weight loss, keep it up  Mammogram to be scheduled at checkout

## 2013-01-13 LAB — HEMOGLOBIN A1C
Hgb A1c MFr Bld: 5.3 % (ref <5.7)
Mean Plasma Glucose: 105 mg/dL (ref <117)

## 2013-01-14 NOTE — Assessment & Plan Note (Signed)
Improved. Pt applauded on succesful weight loss through lifestyle change, and encouraged to continue same. Weight loss goal set for the next several months.  

## 2013-01-14 NOTE — Progress Notes (Signed)
  Subjective:    Patient ID: [name], female    DOB: 1971/08/01, 42 y.o.   MRN: [id number]  HPI The PT is here for follow up and re-evaluation of chronic medical conditions, medication management and review of any available recent lab and radiology data.  Preventive health is updated, specifically  Cancer screening and Immunization.    The PT denies any adverse reactions to current medications since the last visit.  There are no new concerns.  There are no specific complaints       Review of Systems See HPI Denies recent fever or chills. Denies sinus pressure, nasal congestion, ear pain or sore throat. Denies chest congestion, productive cough or wheezing. Denies chest pains, palpitations and leg swelling Denies abdominal pain, nausea, vomiting,diarrhea or constipation.   Denies dysuria, frequency, hesitancy or incontinence. Denies joint pain, swelling and limitation in mobility. Denies headaches, seizures, numbness, or tingling. Denies depression, anxiety or insomnia. Denies skin break down or rash.        Objective:   Physical Exam  Patient alert and oriented and in no cardiopulmonary distress.  HEENT: No facial asymmetry, EOMI, no sinus tenderness,  oropharynx pink and moist.  Neck supple no adenopathy.  Chest: Clear to auscultation bilaterally.  CVS: S1, S2 no murmurs, no S3.  ABD: Soft non tender. Bowel sounds normal.  Ext: No edema  MS: Adequate ROM spine, shoulders, hips and knees.  Skin: Intact, no ulcerations or rash noted.  Psych: Good eye contact, normal affect. Memory intact not anxious or depressed appearing.  CNS: CN 2-12 intact, power, tone and sensation normal throughout.       Assessment & Plan:

## 2013-01-14 NOTE — Assessment & Plan Note (Signed)
Needs up dated lab 

## 2013-01-14 NOTE — Assessment & Plan Note (Signed)
Uncontrolled, amlodipine added. DASH diet and commitment to daily physical activity for a minimum of 30 minutes discussed and encouraged, as a part of hypertension management. The importance of attaining a healthy weight is also discussed.

## 2013-01-18 ENCOUNTER — Ambulatory Visit (HOSPITAL_COMMUNITY)
Admission: RE | Admit: 2013-01-18 | Discharge: 2013-01-18 | Disposition: A | Discharge status: Home / Self Care | Payer: BC Managed Care – PPO | Source: Ambulatory Visit | Attending: Family Medicine | Admitting: Family Medicine

## 2013-01-18 DIAGNOSIS — Z1231 Encounter for screening mammogram for malignant neoplasm of breast: Secondary | ICD-10-CM | POA: Insufficient documentation

## 2013-01-18 DIAGNOSIS — Z139 Encounter for screening, unspecified: Secondary | ICD-10-CM

## 2013-01-18 IMAGING — MG MM DIGITAL SCREENING BILAT
4 series · 4 of 4 positions shown · non-contrast
Comparison: None.

CLINICAL DATA: Screening.

DIGITAL BILATERAL SCREENING MAMMOGRAM WITH CAD

[L CC]
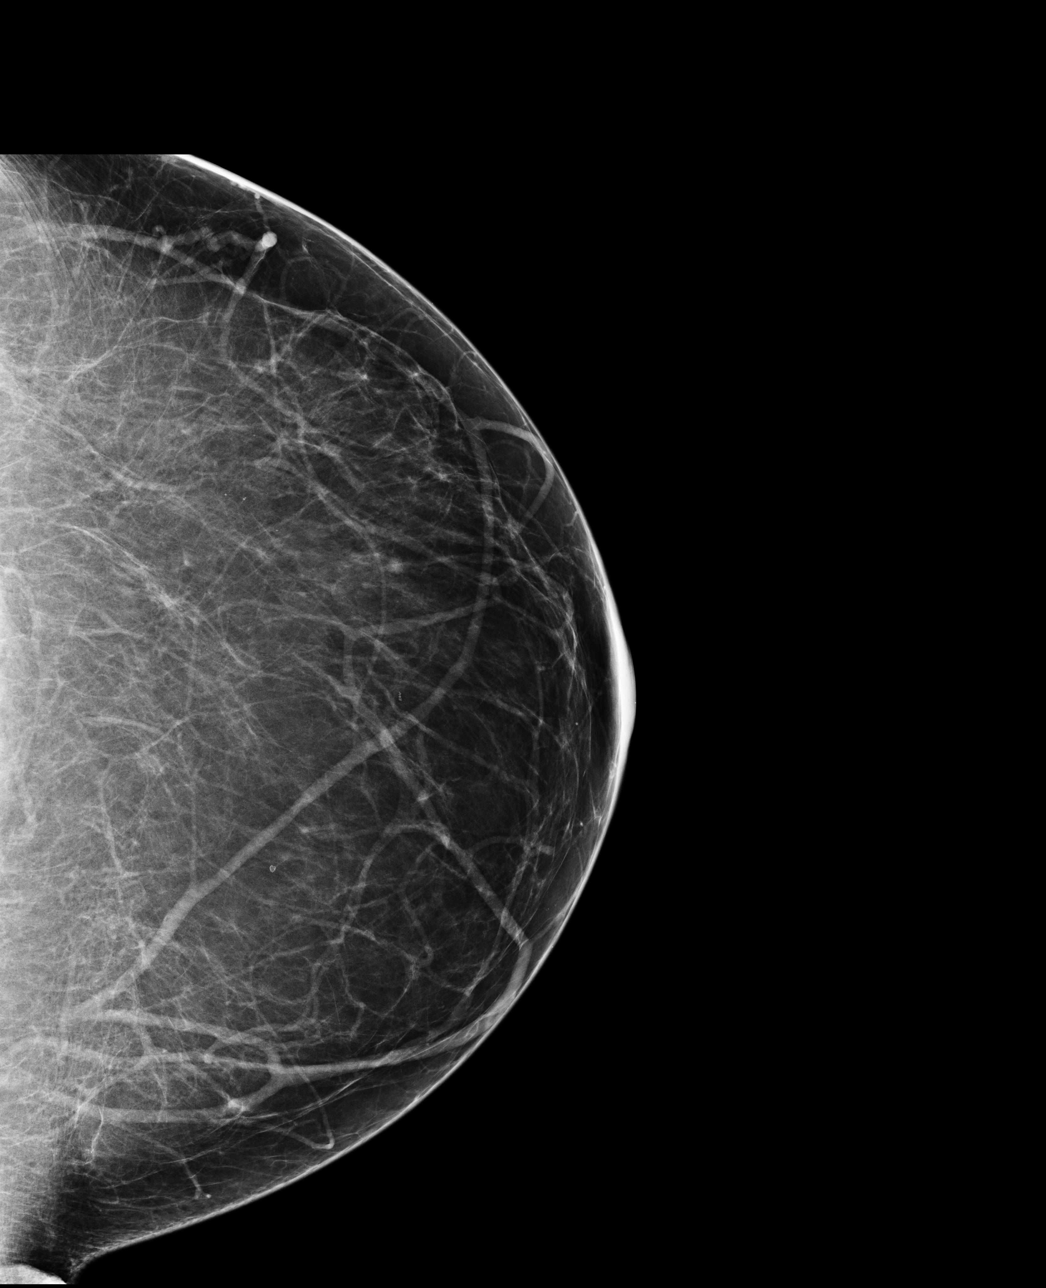

[L MLO]
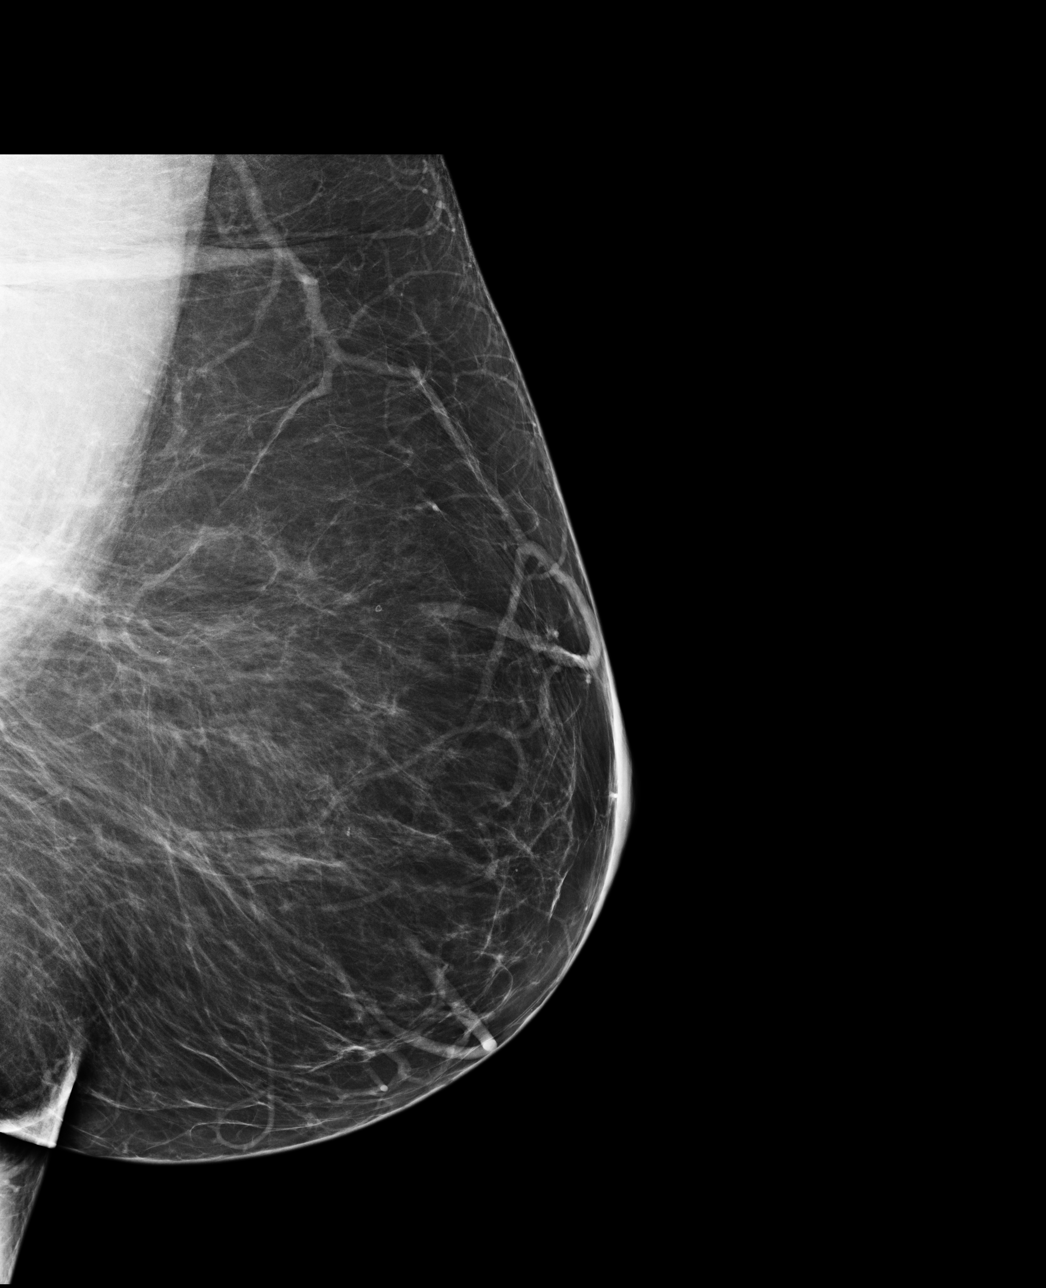

[R CC]
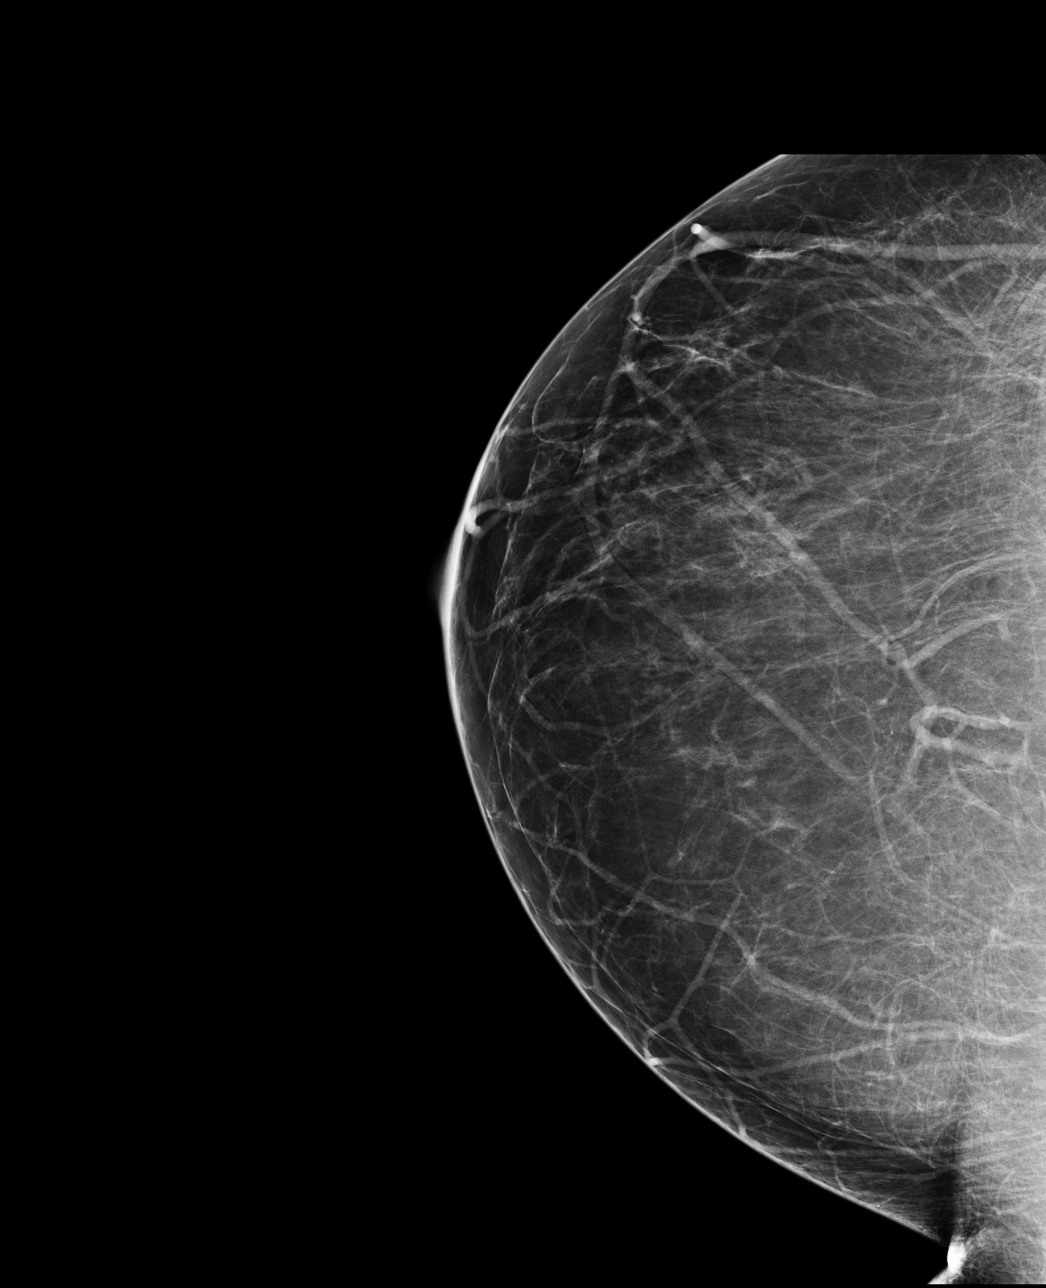

[R MLO]
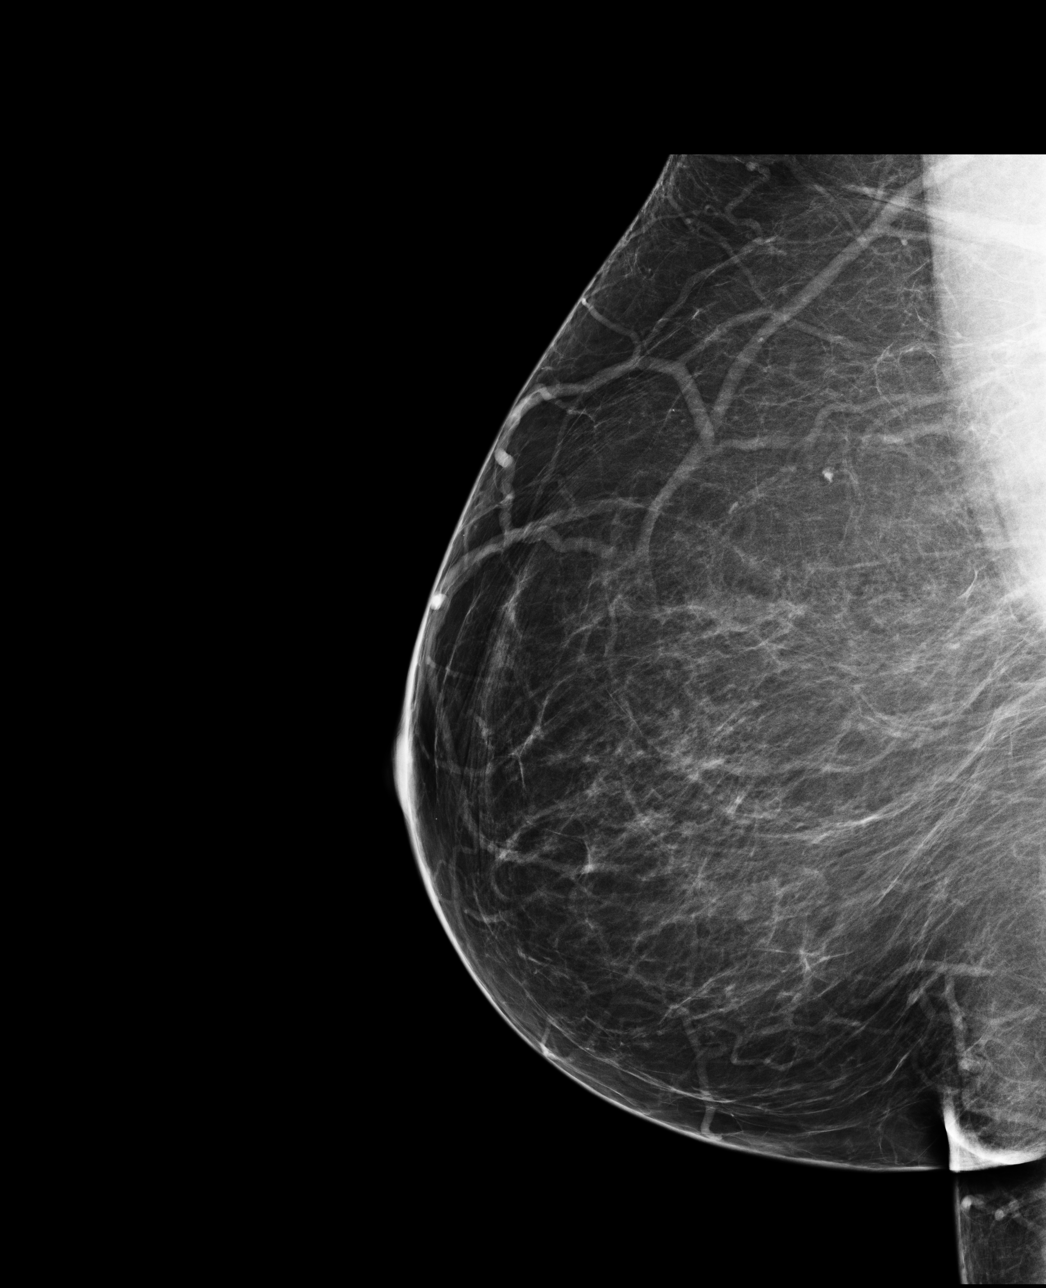

[4 of 4 positions shown; findings below may reference images not displayed]

FINDINGS: ACR Breast Density Category 2: There is a scattered fibroglandular
pattern.

No suspicious masses, architectural distortion, or calcifications
are present.

Images were processed with CAD.
IMPRESSION: No mammographic evidence of malignancy.

A result letter of this screening mammogram will be mailed directly
to the patient.

RECOMMENDATION:
Screening mammogram in one year. (Code:[XD])

BI-RADS CATEGORY 1:  Negative.

## 2013-03-18 ENCOUNTER — Encounter: Payer: BC Managed Care – PPO | Admitting: Family Medicine

## 2013-03-29 ENCOUNTER — Other Ambulatory Visit: Payer: Self-pay

## 2013-03-29 MED ORDER — TRIAMTERENE-HCTZ 75-50 MG PO TABS: ORAL_TABLET | ORAL | Status: DC

## 2013-04-13 ENCOUNTER — Encounter: Payer: BC Managed Care – PPO | Admitting: Family Medicine

## 2013-10-12 ENCOUNTER — Other Ambulatory Visit: Payer: Self-pay | Admitting: Family Medicine

## 2013-10-16 ENCOUNTER — Other Ambulatory Visit: Payer: Self-pay | Admitting: Family Medicine

## 2013-10-27 ENCOUNTER — Encounter (INDEPENDENT_AMBULATORY_CARE_PROVIDER_SITE_OTHER): Payer: Self-pay

## 2013-10-27 ENCOUNTER — Ambulatory Visit (HOSPITAL_COMMUNITY)
Admission: RE | Admit: 2013-10-27 | Discharge: 2013-10-27 | Disposition: A | Discharge status: Home / Self Care | Payer: BC Managed Care – PPO | Source: Ambulatory Visit | Attending: Family Medicine | Admitting: Family Medicine

## 2013-10-27 ENCOUNTER — Encounter: Payer: Self-pay | Admitting: Family Medicine

## 2013-10-27 ENCOUNTER — Ambulatory Visit (INDEPENDENT_AMBULATORY_CARE_PROVIDER_SITE_OTHER): Payer: BC Managed Care – PPO | Admitting: Family Medicine

## 2013-10-27 VITALS — BP 190/130 | HR 99 | Resp 18 | Wt 263.0 lb

## 2013-10-27 DIAGNOSIS — Z87898 Personal history of other specified conditions: Secondary | ICD-10-CM | POA: Insufficient documentation

## 2013-10-27 DIAGNOSIS — Z91199 Patient's noncompliance with other medical treatment and regimen due to unspecified reason: Secondary | ICD-10-CM

## 2013-10-27 DIAGNOSIS — K219 Gastro-esophageal reflux disease without esophagitis: Secondary | ICD-10-CM

## 2013-10-27 DIAGNOSIS — G4733 Obstructive sleep apnea (adult) (pediatric): Secondary | ICD-10-CM | POA: Insufficient documentation

## 2013-10-27 DIAGNOSIS — R079 Chest pain, unspecified: Secondary | ICD-10-CM

## 2013-10-27 DIAGNOSIS — M479 Spondylosis, unspecified: Secondary | ICD-10-CM | POA: Insufficient documentation

## 2013-10-27 DIAGNOSIS — Z9189 Other specified personal risk factors, not elsewhere classified: Secondary | ICD-10-CM

## 2013-10-27 DIAGNOSIS — E669 Obesity, unspecified: Secondary | ICD-10-CM

## 2013-10-27 DIAGNOSIS — I10 Essential (primary) hypertension: Secondary | ICD-10-CM

## 2013-10-27 DIAGNOSIS — R894 Abnormal immunological findings in specimens from other organs, systems and tissues: Secondary | ICD-10-CM

## 2013-10-27 DIAGNOSIS — Z23 Encounter for immunization: Secondary | ICD-10-CM

## 2013-10-27 DIAGNOSIS — I509 Heart failure, unspecified: Secondary | ICD-10-CM

## 2013-10-27 DIAGNOSIS — R0602 Shortness of breath: Secondary | ICD-10-CM | POA: Insufficient documentation

## 2013-10-27 DIAGNOSIS — Z9119 Patient's noncompliance with other medical treatment and regimen: Secondary | ICD-10-CM

## 2013-10-27 LAB — CBC
HCT: 36.3 % (ref 36.0–46.0)
Hemoglobin: 11.4 g/dL — ABNORMAL LOW (ref 12.0–15.0)
MCH: 25.1 pg — ABNORMAL LOW (ref 26.0–34.0)
MCHC: 31.4 g/dL (ref 30.0–36.0)
MCV: 80 fL (ref 78.0–100.0)
Platelets: 388 10*3/uL (ref 150–400)
RBC: 4.54 MIL/uL (ref 3.87–5.11)
RDW: 15.7 % — ABNORMAL HIGH (ref 11.5–15.5)
WBC: 10.3 10*3/uL (ref 4.0–10.5)

## 2013-10-27 IMAGING — CR DG CHEST 2V
2 series · 2 of 2 positions shown · non-contrast
Comparison: [DATE] an [DATE]

CLINICAL DATA: Shortness of breath and chest pain for 6 months.

EXAM:
CHEST  2 VIEW

[view not recorded (1 of 2)]
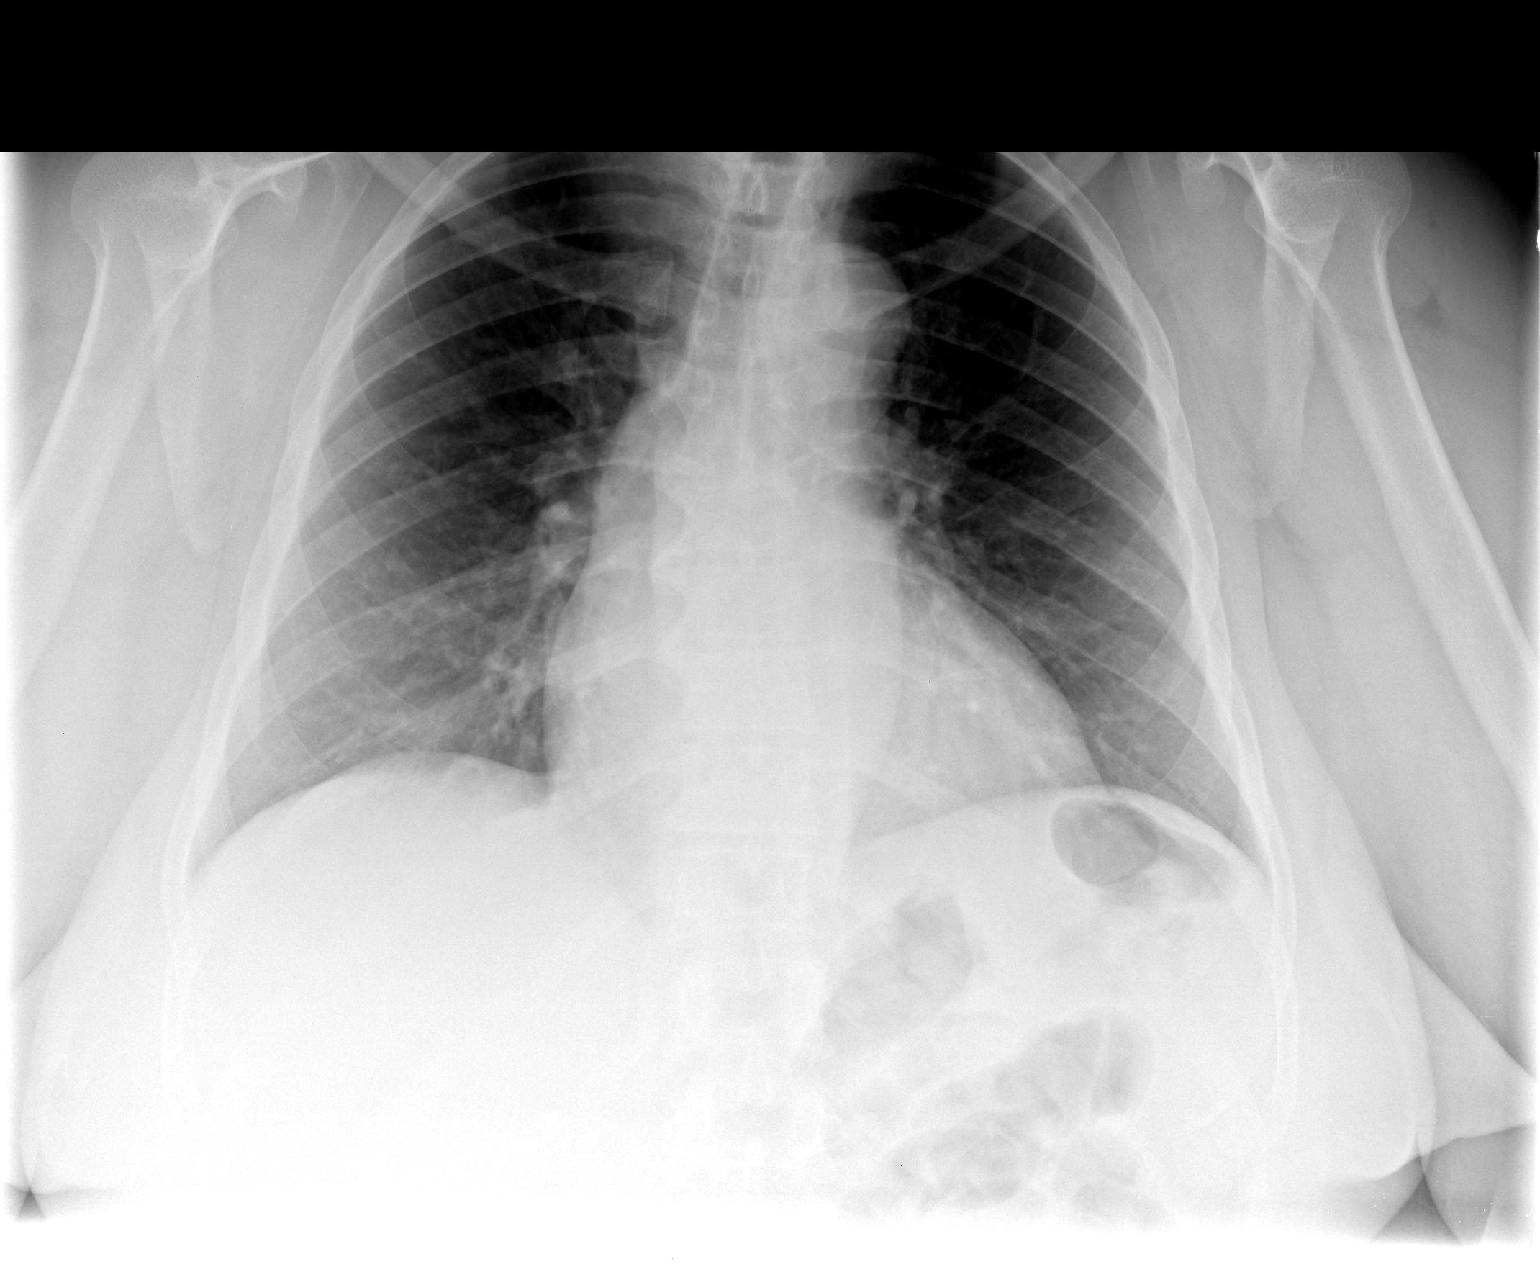

[view not recorded (2 of 2)]
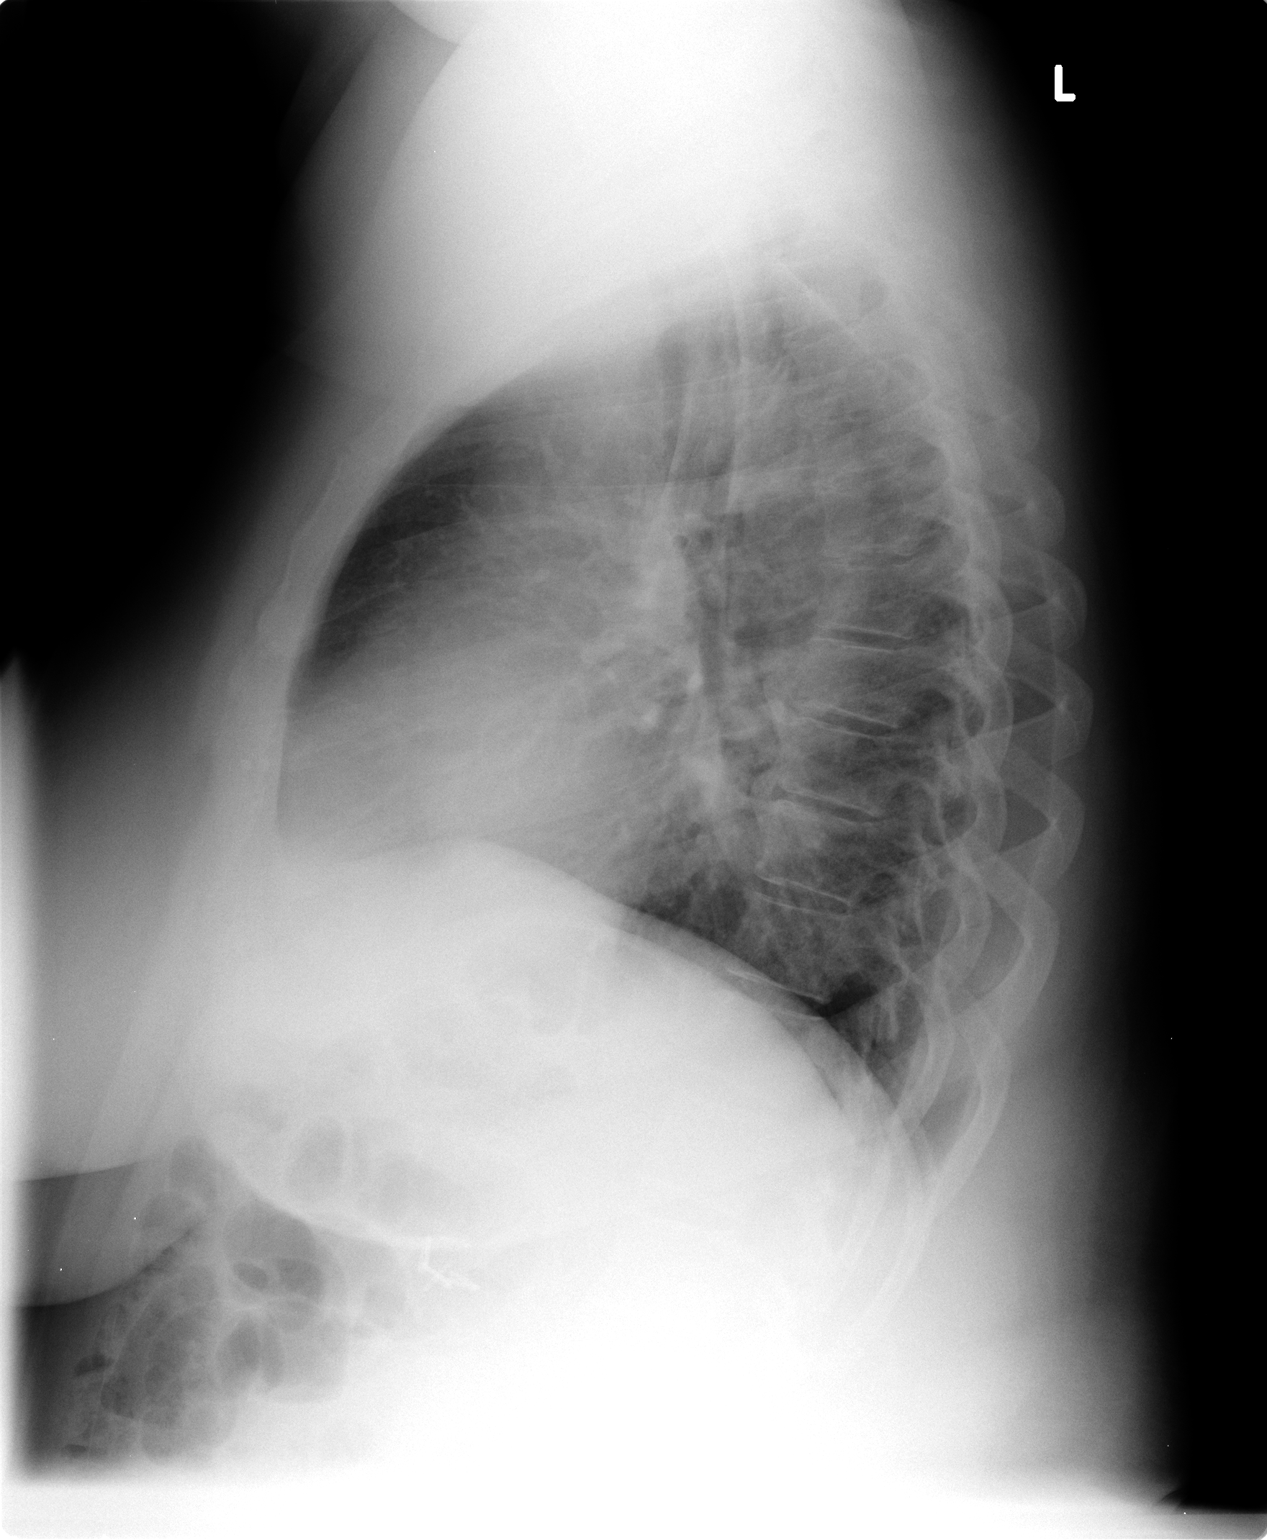

[2 of 2 positions shown; findings below may reference images not displayed]

FINDINGS: Lungs are adequately inflated without focal consolidation or
effusion. Cardiomediastinal silhouette is within normal. There is
mild spondylosis of the spine.
IMPRESSION: No active cardiopulmonary disease.

## 2013-10-27 MED ORDER — PANTOPRAZOLE SODIUM 20 MG PO TBEC: 20.0000 mg | DELAYED_RELEASE_TABLET | Freq: Every day | ORAL | Status: DC

## 2013-10-27 MED ORDER — CLONIDINE HCL 0.3 MG PO TABS: 0.3000 mg | ORAL_TABLET | Freq: Three times a day (TID) | ORAL | Status: DC

## 2013-10-27 MED ORDER — HYDRALAZINE HCL 25 MG PO TABS: 25.0000 mg | ORAL_TABLET | Freq: Three times a day (TID) | ORAL | Status: DC

## 2013-10-27 NOTE — Progress Notes (Signed)
   Subjective:    Patient ID: [name], female    DOB: 1971/01/20, 43 y.o.   MRN: [id number]  HPI  6 month h/o 4 pillow orthopnea, was using 2 prior to this , also waking up short of breath. Notes exertional fatigue in past 4 to 6 months with chest pain with activity, accompanied by nausea and diaphoresis. Experiences palpitations  Also experiences substernal chest pain lying down, also "hot liquid" coming up in her chest when se lies down Positive h/o excessive snoring, pt herself is unaware, c/o fatigue  Review of Systems    See HPI Denies recent fever or chills. Denies sinus pressure, nasal congestion, ear pain or sore throat. Denies chest congestion, productive cough or wheezing.  Denies nausea, vomiting,diarrhea or constipation.   Denies dysuria, frequency, hesitancy or incontinence. Denies joint pain, swelling and limitation in mobility. C/o increased  Headaches,states she feels this is associated with increased BP and sometimes  Takes an extra clonidine , denies  seizures, numbness, weakness or tingling. Denies depression, anxiety or insomnia.Works on avg 6 days per week, 14 hr shifts , uses this as a reason why she does not keep f/u as she should Denies skin break down or rash.     Objective:   Physical Exam  Patient alert and oriented and in no cardiopulmonary distress.  HEENT: No facial asymmetry, EOMI, no sinus tenderness,  oropharynx pink and moist.  Neck supple no adenopathy.No jVD  Chest: Clear to auscultation bilaterally.No reproducible chest wall pain  CVS: S1, S2 no murmurs, no S3.  ABD: Soft mild epigastric tenderness, no guarding or rebound, no organomegaly or mas palpable. Bowel sounds normal.  Ext: No edema  MS: Adequate ROM spine, shoulders, hips and knees.  Skin: Intact, no ulcerations or rash noted.  Psych: Good eye contact, normal affect. Memory intact not anxious or depressed appearing.  CNS: CN 2-12 intact, power, tone and sensation  normal throughout.       Assessment & Plan:

## 2013-10-27 NOTE — Patient Instructions (Addendum)
F/u 11/04/2013, call if you need me before  Flu vaccine today  If you develop sever headache, localized weaknessor numbness go directly to the ED  Blood pressure  Very high, change in clonidine 0.106m one every 8 hours, new is hydrallazine 243mone every 8 hours, continue amlodipine and maxzide as before  You need a CXr today  You are referred to cardiology , stop at checkout to get appt info  No work starting today , and not till you are re evaluated in the office next week  Labs today CBC, lipid, cmp and EGFr, HBa1C. TSH, Hpylori  New medication for reflux protonix 2058mne daily  You are referred to Dr DooMerlene Laughterr evaluation of sleep apnea

## 2013-10-28 ENCOUNTER — Other Ambulatory Visit: Payer: Self-pay

## 2013-10-28 ENCOUNTER — Telehealth: Payer: Self-pay | Admitting: Family Medicine

## 2013-10-28 LAB — COMPLETE METABOLIC PANEL WITH GFR
ALT: 21 U/L (ref 0–35)
AST: 22 U/L (ref 0–37)
Albumin: 4.1 g/dL (ref 3.5–5.2)
Alkaline Phosphatase: 107 U/L (ref 39–117)
BUN: 16 mg/dL (ref 6–23)
CO2: 27 mEq/L (ref 19–32)
Calcium: 9.3 mg/dL (ref 8.4–10.5)
Chloride: 100 mEq/L (ref 96–112)
Creat: 0.61 mg/dL (ref 0.50–1.10)
GFR, Est African American: >89 mL/min
GFR, Est Non African American: >89 mL/min
Glucose, Bld: 79 mg/dL (ref 70–99)
Potassium: 3.6 mEq/L (ref 3.5–5.3)
Sodium: 136 mEq/L (ref 135–145)
Total Bilirubin: 0.9 mg/dL (ref 0.3–1.2)
Total Protein: 7 g/dL (ref 6.0–8.3)

## 2013-10-28 LAB — LIPID PANEL
Cholesterol: 170 mg/dL (ref 0–200)
HDL: 62 mg/dL (ref >39)
LDL Cholesterol: 87 mg/dL (ref 0–99)
Total CHOL/HDL Ratio: 2.7 Ratio
Triglycerides: 103 mg/dL (ref <150)
VLDL: 21 mg/dL (ref 0–40)

## 2013-10-28 LAB — HEMOGLOBIN A1C
Hgb A1c MFr Bld: 5.3 % (ref <5.7)
Mean Plasma Glucose: 105 mg/dL (ref <117)

## 2013-10-28 LAB — TSH: TSH: 1.016 u[IU]/mL (ref 0.350–4.500)

## 2013-10-28 LAB — H. PYLORI ANTIBODY, IGG: H Pylori IgG: 7.63 {ISR} — ABNORMAL HIGH

## 2013-10-28 MED ORDER — AMOXICILL-CLARITHRO-LANSOPRAZ PO MISC: Freq: Two times a day (BID) | ORAL | Status: DC

## 2013-10-28 NOTE — Telephone Encounter (Signed)
Noted, and I appreciate your help

## 2013-10-29 ENCOUNTER — Encounter: Payer: Self-pay | Admitting: Family Medicine

## 2013-10-29 DIAGNOSIS — R7689 Other specified abnormal immunological findings in serum: Secondary | ICD-10-CM | POA: Insufficient documentation

## 2013-10-29 DIAGNOSIS — Z9119 Patient's noncompliance with other medical treatment and regimen: Secondary | ICD-10-CM | POA: Insufficient documentation

## 2013-10-29 DIAGNOSIS — R768 Other specified abnormal immunological findings in serum: Secondary | ICD-10-CM | POA: Insufficient documentation

## 2013-10-29 DIAGNOSIS — Z91199 Patient's noncompliance with other medical treatment and regimen due to unspecified reason: Secondary | ICD-10-CM | POA: Insufficient documentation

## 2013-10-29 DIAGNOSIS — R079 Chest pain, unspecified: Secondary | ICD-10-CM | POA: Insufficient documentation

## 2013-10-29 NOTE — Assessment & Plan Note (Signed)
Uncontrolled stage 2 HTN  In non compliant pt. DASH diet and commitment to daily physical activity for a minimum of 30 minutes discussed and encouraged, as a part of hypertension management. The importance of attaining a healthy weight is also discussed. Pt out of worlk x 1 week, refer to cardiology based on complaints concerning for heart failur and possible angina Medication adjustment made also, with 1 week f/u

## 2013-10-29 NOTE — Assessment & Plan Note (Signed)
Deteriorated. Patient re-educated about  the importance of commitment to a  minimum of 150 minutes of exercise per week. The importance of healthy food choices with portion control discussed. Encouraged to start a food diary, count calories and to consider  joining a support group. Sample diet sheets offered. Goals set by the patient for the next several months.    

## 2013-10-29 NOTE — Assessment & Plan Note (Signed)
Stage 2 HTN, symptoms suggestive of CHF in pt with uncontrolled HTN, office EKG shows no acute ischemia , however pt definitely needs cardiology eval, referred for asap appt

## 2013-10-29 NOTE — Assessment & Plan Note (Signed)
Uncontrolled HTN , morbid obesity, thick neck, likely has OSa refer for sleep eval

## 2013-10-29 NOTE — Assessment & Plan Note (Signed)
Educated re need to d/c caffeine also to eat 3 hrs before lying down, PPI prescribed

## 2013-10-29 NOTE — Assessment & Plan Note (Signed)
Chronically uncontrolled HTN, fails to keep f/u appt, mother has established CAD in her 47's

## 2013-11-02 ENCOUNTER — Encounter: Payer: Self-pay | Admitting: Cardiology

## 2013-11-02 ENCOUNTER — Ambulatory Visit (INDEPENDENT_AMBULATORY_CARE_PROVIDER_SITE_OTHER): Payer: BC Managed Care – PPO | Admitting: Cardiology

## 2013-11-02 VITALS — BP 183/44 | HR 109 | Ht 63.0 in | Wt 258.4 lb

## 2013-11-02 DIAGNOSIS — I10 Essential (primary) hypertension: Secondary | ICD-10-CM

## 2013-11-02 DIAGNOSIS — R079 Chest pain, unspecified: Secondary | ICD-10-CM

## 2013-11-02 DIAGNOSIS — R0602 Shortness of breath: Secondary | ICD-10-CM

## 2013-11-02 MED ORDER — LISINOPRIL 10 MG PO TABS: 10.0000 mg | ORAL_TABLET | Freq: Every day | ORAL | Status: DC

## 2013-11-02 NOTE — Patient Instructions (Signed)
Your physician recommends that you schedule a follow-up appointment in: 3 weeks. Your physician has recommended you make the following change in your medication: Start lisinopril 10 mg daily. Your new prescription has been sent to your pharmacy. All other medications will remain the same. Your physician has requested that you have an echocardiogram. Echocardiography is a painless test that uses sound waves to create images of your heart. It provides your doctor with information about the size and shape of your heart and how well your heart's chambers and valves are working. This procedure takes approximately one hour. There are no restrictions for this procedure. Your physician recommends that you have lab work to check your BMET, Renin level, Aldosterone level and Renin Aldosterone Ratio. Your physician has requested that you regularly monitor and record your blood pressure readings at home. Please use the same machine at the same time of day to check your readings and record them to bring to your follow-up visit.

## 2013-11-02 NOTE — Progress Notes (Addendum)
Clinical Summary Ms. Nola is a 43 y.o.female seen today as a new patient, she is referred for symptoms of congestive heart failure and chest pain.    1. Dyspnea - reports some SOB, DOE that started approx 3 months. Can walk approx 5 blocks before getting short of breath. Reports prior to 3 months ago she could walk further distances without troubles. Occasional orthopea, no LE edema, no PND  2. Chest pain - tightness in midchest, 2/10. +Nausea, can feel diaporetic. No SOB or palps. Pain lasts approx 5 minutes. Can occur at rest or with exertion. Better with deep breaths. Occurs only 1-2 times a week.  CAD risk factors: HTN, mother MI in late 56s  3. HTN - per notes chronically uncontrolled - checks bp at home daily, typically 170s. - reports she was in her 37s when diagnosed with HTN after second pregancy. Reports hydralazine started just earlier this month. Labs Jan 2015: K 3.6 Cr 0.61 TSH 1.016 - being worked up for sleep apnea - other history regarding primary hypertension, she is being worked up for sleep apnea. She denies any significant alcohol use. No history of kidney disease.      Past Medical History  Diagnosis Date  . Obesity   . Hypertension   . Fibroid      No Known Allergies   Current Outpatient Prescriptions  Medication Sig Dispense Refill  . amLODipine (NORVASC) 10 MG tablet Take 1 tablet (10 mg total) by mouth daily.  30 tablet  11  . amoxicillin-clarithromycin-lansoprazole (PREVPAC) combo pack Take by mouth 2 (two) times daily. Follow package directions.  1 kit  0  . cloNIDine (CATAPRES) 0.3 MG tablet Take 1 tablet (0.3 mg total) by mouth 3 (three) times daily.  90 tablet  11  . hydrALAZINE (APRESOLINE) 25 MG tablet Take 1 tablet (25 mg total) by mouth 3 (three) times daily.  90 tablet  1  . pantoprazole (PROTONIX) 20 MG tablet Take 1 tablet (20 mg total) by mouth daily.  30 tablet  2  . triamterene-hydrochlorothiazide (MAXZIDE) 75-50 MG per tablet  TAKE ONE TABLET BY MOUTH ONCE DAILY  30 tablet  0   No current facility-administered medications for this visit.     Past Surgical History  Procedure Laterality Date  . Bilateral breast reduction  2004  . Appendectomy    . Cholecystectomy    . Removal of left ovarian cyst  2003  . Btl  2011  . Tubal ligation  2011    [facility]     No Known Allergies    Family History  Problem Relation Age of Onset  . Coronary artery disease Mother   . Hypertension Mother   . Coronary artery disease Father   . Kidney disease Father   . Hypertension Father   . Hypertension Sister   . Anesthesia problems Neg Hx   . Hypotension Neg Hx   . Malignant hyperthermia Neg Hx   . Pseudochol deficiency Neg Hx      Social History Ms. Morning reports that she has never smoked. She has never used smokeless tobacco. Ms. Rappa reports that she drinks alcohol.   Review of Systems CONSTITUTIONAL: No weight loss, fever, chills, weakness or fatigue.  HEENT: Eyes: No visual loss, blurred vision, double vision or yellow sclerae.No hearing loss, sneezing, congestion, runny nose or sore throat.  SKIN: No rash or itching.  CARDIOVASCULAR: per HPI RESPIRATORY: No shortness of breath, cough or sputum.  GASTROINTESTINAL: No anorexia, nausea,  vomiting or diarrhea. No abdominal pain or blood.  GENITOURINARY: No burning on urination, no polyuria NEUROLOGICAL: No headache, dizziness, syncope, paralysis, ataxia, numbness or tingling in the extremities. No change in bowel or bladder control.  MUSCULOSKELETAL: No muscle, back pain, joint pain or stiffness.  LYMPHATICS: No enlarged nodes. No history of splenectomy.  PSYCHIATRIC: No history of depression or anxiety.  ENDOCRINOLOGIC: No reports of sweating, cold or heat intolerance. No polyuria or polydipsia.  Marland Kitchen   Physical Examination p 109 bp 190/110 Wt 258 pounds Wt 45 Gen: resting comfortably, no acute distress HEENT: no scleral icterus, pupils equal round and  reactive, no palptable cervical adenopathy,  CV: RRR, no m/r/g, no JVD, no carotid bruits Resp: Clear to auscultation bilaterally GI: abdomen is soft, non-tender, non-distended, normal bowel sounds, no hepatosplenomegaly MSK: extremities are warm, no edema.  Skin: warm, no rash Neuro:  no focal deficits Psych: appropriate affect   Diagnostic Studies 10/27/13 CXR FINDINGS: Lungs are adequately inflated without focal consolidation or effusion. Cardiomediastinal silhouette is within normal. There is mild spondylosis of the spine.  IMPRESSION: No active cardiopulmonary disease.   11/02/13 Clinic EKG Normal sinus rhythm Assessment and Plan  1. Dyspnea - symptoms concerning for possible cardio etiology, given her long history of difficult to control HTN she is at risk for cardiac dysfunction (either systolic or diastolic) - will check echo to further evaluate  2. HTN - early onset, very difficult to control. Suggestive of possible primary hypertension. She also meets criteria for resistant HTN. - agree with sleep apnea eval initated by PCP - she has normal thyroid function, denies significant EtOH use - will check renin/aldo levels and ratio - will start lisinopril 79m daily (she reports history of tubal ligation with no planned further pregnancies) - consider starting spironolactone at follow up in the setting of resistant hypertension  3. Chest pain - atypical for angina, she does have some CAD risk factors including a family history of CAD - pending echo results, likely consider non-invasive ischemic evaluation  Follow up 3-4 weeks.     JArnoldo Lenis M.D., F.A.C.C.

## 2013-11-04 ENCOUNTER — Ambulatory Visit (INDEPENDENT_AMBULATORY_CARE_PROVIDER_SITE_OTHER): Payer: BC Managed Care – PPO | Admitting: Family Medicine

## 2013-11-04 ENCOUNTER — Emergency Department (HOSPITAL_COMMUNITY)
Admission: EM | Admit: 2013-11-04 | Discharge: 2013-11-04 | Disposition: A | Discharge status: Home / Self Care | Payer: BC Managed Care – PPO | Attending: Emergency Medicine | Admitting: Emergency Medicine

## 2013-11-04 ENCOUNTER — Encounter (HOSPITAL_COMMUNITY): Payer: Self-pay | Admitting: Emergency Medicine

## 2013-11-04 ENCOUNTER — Encounter: Payer: Self-pay | Admitting: Family Medicine

## 2013-11-04 VITALS — BP 200/130 | HR 98 | Resp 16 | Ht 64.0 in | Wt 260.0 lb

## 2013-11-04 DIAGNOSIS — R51 Headache: Secondary | ICD-10-CM

## 2013-11-04 DIAGNOSIS — I10 Essential (primary) hypertension: Secondary | ICD-10-CM

## 2013-11-04 DIAGNOSIS — E669 Obesity, unspecified: Secondary | ICD-10-CM | POA: Insufficient documentation

## 2013-11-04 DIAGNOSIS — R519 Headache, unspecified: Secondary | ICD-10-CM | POA: Insufficient documentation

## 2013-11-04 DIAGNOSIS — Z792 Long term (current) use of antibiotics: Secondary | ICD-10-CM | POA: Insufficient documentation

## 2013-11-04 DIAGNOSIS — Z8742 Personal history of other diseases of the female genital tract: Secondary | ICD-10-CM | POA: Insufficient documentation

## 2013-11-04 DIAGNOSIS — Z79899 Other long term (current) drug therapy: Secondary | ICD-10-CM | POA: Insufficient documentation

## 2013-11-04 LAB — CBC WITH DIFFERENTIAL/PLATELET
Basophils Absolute: 0 10*3/uL (ref 0.0–0.1)
Basophils Relative: 0 % (ref 0–1)
Eosinophils Absolute: 0.1 10*3/uL (ref 0.0–0.7)
Eosinophils Relative: 1 % (ref 0–5)
HCT: 37.7 % (ref 36.0–46.0)
Hemoglobin: 12 g/dL (ref 12.0–15.0)
Lymphocytes Relative: 23 % (ref 12–46)
Lymphs Abs: 2.4 10*3/uL (ref 0.7–4.0)
MCH: 26.3 pg (ref 26.0–34.0)
MCHC: 31.8 g/dL (ref 30.0–36.0)
MCV: 82.7 fL (ref 78.0–100.0)
Monocytes Absolute: 0.7 10*3/uL (ref 0.1–1.0)
Monocytes Relative: 7 % (ref 3–12)
Neutro Abs: 7.3 10*3/uL (ref 1.7–7.7)
Neutrophils Relative %: 69 % (ref 43–77)
Platelets: 321 10*3/uL (ref 150–400)
RBC: 4.56 MIL/uL (ref 3.87–5.11)
RDW: 15.7 % — ABNORMAL HIGH (ref 11.5–15.5)
WBC: 10.6 10*3/uL — ABNORMAL HIGH (ref 4.0–10.5)

## 2013-11-04 LAB — COMPREHENSIVE METABOLIC PANEL
ALT: 21 U/L (ref 0–35)
AST: 21 U/L (ref 0–37)
Albumin: 3.6 g/dL (ref 3.5–5.2)
Alkaline Phosphatase: 119 U/L — ABNORMAL HIGH (ref 39–117)
BUN: 16 mg/dL (ref 6–23)
CO2: 26 mEq/L (ref 19–32)
Calcium: 9.3 mg/dL (ref 8.4–10.5)
Chloride: 100 mEq/L (ref 96–112)
Creatinine, Ser: 0.96 mg/dL (ref 0.50–1.10)
GFR calc Af Amer: 84 mL/min — ABNORMAL LOW (ref >90)
GFR calc non Af Amer: 72 mL/min — ABNORMAL LOW (ref >90)
Glucose, Bld: 94 mg/dL (ref 70–99)
Potassium: 3.2 mEq/L — ABNORMAL LOW (ref 3.7–5.3)
Sodium: 137 mEq/L (ref 137–147)
Total Bilirubin: 0.8 mg/dL (ref 0.3–1.2)
Total Protein: 7.7 g/dL (ref 6.0–8.3)

## 2013-11-04 MED ORDER — HYDROCODONE-ACETAMINOPHEN 5-325 MG PO TABS
1.0000 | ORAL_TABLET | Freq: Once | ORAL | Status: AC
  Administered 2013-11-04: 1 via ORAL
  Filled 2013-11-04: qty 1

## 2013-11-04 MED ORDER — LABETALOL HCL 200 MG PO TABS: 100.0000 mg | ORAL_TABLET | Freq: Three times a day (TID) | ORAL | Status: DC

## 2013-11-04 MED ORDER — LABETALOL HCL 5 MG/ML IV SOLN
40.0000 mg | Freq: Once | INTRAVENOUS | Status: AC
Start: 2013-11-04 — End: 2013-11-04
  Administered 2013-11-04: 40 mg via INTRAVENOUS
  Filled 2013-11-04: qty 8

## 2013-11-04 NOTE — Progress Notes (Signed)
   Subjective:    Patient ID: [name], female    DOB: [birth year], 43 y.o.   MRN: [id number]  HPI Pt in for re evaluation of HTN. She was seen 1 week ago, taken out iof work, both because of severity of the hTN as well as concerns re symptoms of underlying heart disease. She saw cardiology yesterday, there are plans for further testing and possible medication changes, however , of note yesterday BP documented by Doc showed no real improvement despite pt being on multiple potent meds which she reports taking as prescribed . Other than persistent severe headache over the past week she has no neurologic complaints, she does say however, that she feels extremely tiired  Review of Systems See HPI Denies recent fever or chills. Denies sinus pressure, nasal congestion, ear pain or sore throat. Denies chest congestion, productive cough or wheezing. Denies abdominal pain, nausea, vomiting   Denies  seizures, numbness, or tingling.       Objective:   Physical Exam Patient alert and oriented and in no cardiopulmonary distress.Pt appears fatigued, not her usual "chirpy self"  HEENT: No facial asymmetry, EOMI, no sinus tenderness,  oropharynx pink and moist.  Neck supple no adenopathy.  Chest: Clear to auscultation bilaterally.  CVS: S1, S2 no murmurs, no S3.  ABD: Soft non tender. Ext: No edema  MS: Adequate ROM spine, shoulders, hips and knees.    Psych: Good eye contact, normal affect. Memory intact not anxious or depressed appearing.  CNS: CN 2-12 intact, power, normal throughout.        Assessment & Plan:

## 2013-11-04 NOTE — Discharge Instructions (Signed)
Stop your hydralazine.  Follow up in one week or sooner if problems

## 2013-11-04 NOTE — ED Provider Notes (Signed)
CSN: [id number]     Arrival date & time 11/04/13  1253 History  This chart was scribed for Maudry Diego, MD by Roxan Diesel, ED scribe.  This patient was seen in room APA01/[facility]01 and the patient's care was started at 4:17 PM.   Chief Complaint  Patient presents with  . Hypertension    Patient is a 43 y.o. female presenting with hypertension. The history is provided by the patient. No language interpreter was used.  Hypertension This is a chronic problem. The current episode started more than 1 week ago. The problem occurs daily. The problem has not changed since onset.Associated symptoms include headaches. Pertinent negatives include no chest pain and no abdominal pain. Treatments tried: 5 BP medications. The treatment provided no relief.    HPI Comments: [name] is a 43 y.o. female sent by PCP to the Emergency Department complaining of intermittent hypertension over the past week that has not been controlled by her multiple BP medications.  Pt has been to her PCP mutiple times over the past week for issues with her blood pressure.  She states that when she checks it at home it is usually over 446 systolic and has peaked over 200.  These episodes are associated with throbbing headaches.  Pt has been using 3 BP medications for some time (Clonidine 0.71m 3x/day, Norvasc 119m1x/day, and Maxzide 1x/day).  One week ago she was also placed on hydralazine 2569mx/day, and 2 days ago her cardiologist placed her on lisinopril 12m49m/day.  Pt states she has been compliant with all of these medications but her BP has continued to fluctuate even at rest.  Since arrival it has ranged from 182/120-200/130.   Past Medical History  Diagnosis Date  . Obesity   . Hypertension   . Fibroid     Past Surgical History  Procedure Laterality Date  . Bilateral breast reduction  2004  . Appendectomy    . Cholecystectomy    . Removal of left ovarian cyst  2003  . Btl  2011  . Tubal ligation  2011     [facility]    Family History  Problem Relation Age of Onset  . Coronary artery disease Mother   . Hypertension Mother   . Coronary artery disease Father   . Kidney disease Father   . Hypertension Father   . Hypertension Sister   . Anesthesia problems Neg Hx   . Hypotension Neg Hx   . Malignant hyperthermia Neg Hx   . Pseudochol deficiency Neg Hx     History  Substance Use Topics  . Smoking status: Never Smoker   . Smokeless tobacco: Never Used  . Alcohol Use: Yes     Comment: rarely    OB History   Grav Para Term Preterm Abortions TAB SAB Ect Mult Living   '6 4  4 1  1   4      ' Review of Systems  Constitutional: Negative for appetite change and fatigue.  HENT: Negative for congestion, ear discharge and sinus pressure.   Eyes: Negative for discharge.  Respiratory: Negative for cough.   Cardiovascular: Negative for chest pain.  Gastrointestinal: Negative for abdominal pain and diarrhea.  Genitourinary: Negative for frequency and hematuria.  Musculoskeletal: Negative for back pain.  Skin: Negative for rash.  Neurological: Positive for headaches. Negative for seizures.  Psychiatric/Behavioral: Negative for hallucinations.     Allergies  Review of patient's allergies indicates no known allergies.  Home Medications   Current  Outpatient Rx  Name  Route  Sig  Dispense  Refill  . amLODipine (NORVASC) 10 MG tablet   Oral   Take 1 tablet (10 mg total) by mouth daily.   30 tablet   11   . amoxicillin-clarithromycin-lansoprazole (PREVPAC) combo pack   Oral   Take by mouth 2 (two) times daily. Follow package directions.   1 kit   0   . cloNIDine (CATAPRES) 0.3 MG tablet   Oral   Take 1 tablet (0.3 mg total) by mouth 3 (three) times daily.   90 tablet   11     Dose increase effective 10/27/2013   . hydrALAZINE (APRESOLINE) 25 MG tablet   Oral   Take 1 tablet (25 mg total) by mouth 3 (three) times daily.   90 tablet   1   . lisinopril (PRINIVIL,ZESTRIL) 10  MG tablet   Oral   Take 1 tablet (10 mg total) by mouth daily.   90 tablet   3   . pantoprazole (PROTONIX) 20 MG tablet   Oral   Take 1 tablet (20 mg total) by mouth daily.   30 tablet   2   . triamterene-hydrochlorothiazide (MAXZIDE) 75-50 MG per tablet      TAKE ONE TABLET BY MOUTH ONCE DAILY   30 tablet   0    BP 183/120  Pulse 103  Temp(Src) 98.4 F (36.9 C) (Oral)  Resp 18  Ht '5\' 3"'  (1.6 m)  Wt 258 lb (117.028 kg)  BMI 45.71 kg/m2  SpO2 99%  LMP 10/15/2013  Breastfeeding? No  Physical Exam  Nursing note and vitals reviewed. Constitutional: She is oriented to person, place, and time. She appears well-developed.  HENT:  Head: Normocephalic.  Eyes: Conjunctivae and EOM are normal. No scleral icterus.  Neck: Neck supple. No thyromegaly present.  Cardiovascular: Normal rate and regular rhythm.  Exam reveals no gallop and no friction rub.   No murmur heard. Pulmonary/Chest: No stridor. She has no wheezes. She has no rales. She exhibits no tenderness.  Abdominal: She exhibits no distension. There is no tenderness. There is no rebound.  Musculoskeletal: Normal range of motion. She exhibits no edema.  Lymphadenopathy:    She has no cervical adenopathy.  Neurological: She is oriented to person, place, and time. She exhibits normal muscle tone. Coordination normal.  Skin: No rash noted. No erythema.  Psychiatric: She has a normal mood and affect. Her behavior is normal.    ED Course  Procedures (including critical care time)  DIAGNOSTIC STUDIES: Oxygen Saturation is 99% on room air, normal by my interpretation.    COORDINATION OF CARE: 4:21 PM-Discussed treatment plan which includes labs with pt at bedside and pt agreed to plan.    Labs Review Labs Reviewed  CBC WITH DIFFERENTIAL - Abnormal; Notable for the following:    WBC 10.6 (*)    RDW 15.7 (*)    All other components within normal limits  COMPREHENSIVE METABOLIC PANEL - Abnormal; Notable for the  following:    Potassium 3.2 (*)    Alkaline Phosphatase 119 (*)    GFR calc non Af Amer 72 (*)    GFR calc Af Amer 84 (*)    All other components within normal limits    Imaging Review No results found.   EKG Interpretation   None       MDM  Htn,  Pt responded to labetolol and she used this with her pregnancy and it work.  The chart was scribed for me under my direct supervision.  I personally performed the history, physical, and medical decision making and all procedures in the evaluation of this patient.Maudry Diego, MD 11/04/13 [phone]

## 2013-11-04 NOTE — Assessment & Plan Note (Signed)
Headache x over 1 week, no relief with OTC medication, non focal neuro exam at OV

## 2013-11-04 NOTE — ED Notes (Signed)
Sent from Dr Camillia Herter office for hypertension

## 2013-11-04 NOTE — Assessment & Plan Note (Signed)
Unchanged, uncontrolled despite 1 week trial of aggressive out patient management, refer to ED. IDirect contact nade with ED  Doc. Out of work for an additional week

## 2013-11-04 NOTE — Patient Instructions (Signed)
You need to go directly to the ED, I have spoken to the ED Doctor directly and you are expected  Work excuse  Form last Friday to return Feb 2, you will need a return visit with me next Friday.  I am very concerned about your blood pressure remaining  Extremely elevated despite being out of work and taking so many medications to get it nearer normal

## 2013-11-09 ENCOUNTER — Other Ambulatory Visit: Payer: BC Managed Care – PPO

## 2013-11-09 DIAGNOSIS — R0989 Other specified symptoms and signs involving the circulatory and respiratory systems: Secondary | ICD-10-CM

## 2013-11-10 ENCOUNTER — Ambulatory Visit: Payer: BC Managed Care – PPO | Admitting: Cardiovascular Disease

## 2013-11-11 ENCOUNTER — Encounter: Payer: Self-pay | Admitting: Family Medicine

## 2013-11-11 ENCOUNTER — Ambulatory Visit (INDEPENDENT_AMBULATORY_CARE_PROVIDER_SITE_OTHER): Payer: BC Managed Care – PPO | Admitting: Family Medicine

## 2013-11-11 VITALS — BP 110/80 | HR 84 | Resp 18 | Ht 64.0 in | Wt 253.0 lb

## 2013-11-11 DIAGNOSIS — R894 Abnormal immunological findings in specimens from other organs, systems and tissues: Secondary | ICD-10-CM

## 2013-11-11 DIAGNOSIS — E669 Obesity, unspecified: Secondary | ICD-10-CM

## 2013-11-11 DIAGNOSIS — R51 Headache: Secondary | ICD-10-CM

## 2013-11-11 DIAGNOSIS — I10 Essential (primary) hypertension: Secondary | ICD-10-CM

## 2013-11-11 DIAGNOSIS — R768 Other specified abnormal immunological findings in serum: Secondary | ICD-10-CM

## 2013-11-11 MED ORDER — LISINOPRIL 20 MG PO TABS: 20.0000 mg | ORAL_TABLET | Freq: Every day | ORAL | Status: DC

## 2013-11-11 NOTE — Patient Instructions (Addendum)
CPE in end March or early April, call if you need me before  Blood prrssure is  perfect.  Non fasting chem 7  today  Return to work 11/14/2013  No  Sodas, 55% intake fruit and vegetable , fresh or frozen Commit to 30 mins total every day of walking  Weight loss goal of  3 to 6 pounds per month

## 2013-11-12 NOTE — Assessment & Plan Note (Signed)
Completing current course of medication

## 2013-11-12 NOTE — Assessment & Plan Note (Signed)
Improved. Pt applauded on succesful weight loss through lifestyle change, and encouraged to continue same. Weight loss goal set for the next several months.  

## 2013-11-12 NOTE — Progress Notes (Signed)
   Subjective:    Patient ID: [name], female    DOB: May 19, 1971, 43 y.o.   MRN: [id number]  HPI Pt in for re evaluation of uncontrolled hypertension associated with headache. Labetalol has indeed proved to be her "wonder drug" she denies headache , and has a NORMAL blood Pressure. She has been out of work and will be t returned to work next week Monday. She understands the need to stay on her consistent regular schedule of taking her meds at the same time every day. She still has further cardiology evaluation and knows she needs to keep this. Fortunately for  Her , at this time, hypertension and obesity are her only 2 chronic diseases She will commit to consistent lifestyle change to lose weight   Review of Systems See HPI Denies recent fever or chills. Denies sinus pressure, nasal congestion, ear pain or sore throat. Denies chest congestion, productive cough or wheezing. Denies chest pains, palpitations and leg swelling Denies abdominal pain, nausea, vomiting,diarrhea or constipation.   Denies dysuria, frequency, hesitancy or incontinence. Denies joint pain, swelling and limitation in mobility. Denies headaches, seizures, numbness, or tingling. Denies depression, anxiety or insomnia. Denies skin break down or rash.        Objective:   Physical Exam Patient alert and oriented and in no cardiopulmonary distress.  HEENT: No facial asymmetry, EOMI, no sinus tenderness,  oropharynx pink and moist.  Neck supple no adenopathy.  Chest: Clear to auscultation bilaterally.  CVS: S1, S2 no murmurs, no S3.  ABD: Soft non tender. Bowel sounds normal.  Ext: No edema  MS: Adequate ROM spine, shoulders, hips and knees.  Skin: Intact, no ulcerations or rash noted.  Psych: Good eye contact, normal affect. Memory intact not anxious or depressed appearing.  CNS: CN 2-12 intact, power, tone and sensation normal throughout.        Assessment & Plan:

## 2013-11-12 NOTE — Assessment & Plan Note (Signed)
Controlled, no change in medication DASH diet and commitment to daily physical activity for a minimum of 30 minutes discussed and encouraged, as a part of hypertension management. The importance of attaining a healthy weight is also discussed.  

## 2013-11-12 NOTE — Assessment & Plan Note (Signed)
Resolved

## 2013-11-21 ENCOUNTER — Other Ambulatory Visit: Payer: Self-pay

## 2013-11-21 ENCOUNTER — Encounter: Payer: Self-pay | Admitting: Family Medicine

## 2013-11-21 MED ORDER — TRIAMTERENE-HCTZ 75-50 MG PO TABS: 1.0000 | ORAL_TABLET | Freq: Every day | ORAL | Status: DC

## 2013-12-01 ENCOUNTER — Telehealth: Payer: Self-pay | Admitting: Cardiology

## 2013-12-01 NOTE — Telephone Encounter (Signed)
Called and left message for pt to return call.  

## 2013-12-01 NOTE — Telephone Encounter (Signed)
Message copied by Lewayne Bunting on Thu Dec 01, 2013  9:16 AM ------      Message from: Lewayne Bunting      Created: Thu Nov 03, 2013  2:06 PM      Regarding: Dr. Harl Bowie FU       BMET      Renin / Aldosterone      Renin / Aldosterone ratio ------

## 2013-12-05 ENCOUNTER — Encounter: Payer: Self-pay | Admitting: Cardiology

## 2013-12-05 ENCOUNTER — Ambulatory Visit: Payer: BC Managed Care – PPO | Admitting: Cardiology

## 2013-12-05 NOTE — Progress Notes (Unsigned)
Clinical Summary [name] is a 43 y.o.female seen today for follow up of the following medical problems.   1. Dyspnea  - reports some SOB, DOE that started approx 3 months. Can walk approx 5 blocks before getting short of breath. Reports prior to 3 months ago she could walk further distances without troubles. Occasional orthopea, no LE edema, no PND   2. Chest pain  - tightness in midchest, 2/10. +Nausea, can feel diaporetic. No SOB or palps. Pain lasts approx 5 minutes. Can occur at rest or with exertion. Better with deep breaths. Occurs only 1-2 times a week.  CAD risk factors: HTN, mother MI in late 39s   3. HTN  - per notes chronically uncontrolled  - checks bp at home daily, typically 170s.  - reports she was in her 78s when diagnosed with HTN after second pregancy. Reports hydralazine started just earlier this month.  Labs Jan 2015: K 3.6 Cr 0.61 TSH 1.016  - being worked up for sleep apnea  - other history regarding primary hypertension, she is being worked up for sleep apnea. She denies any significant alcohol use. No history of kidney disease.   Past Medical History  Diagnosis Date  . Obesity   . Hypertension   . Fibroid      No Known Allergies   Current Outpatient Prescriptions  Medication Sig Dispense Refill  . acetaminophen (TYLENOL) 500 MG tablet Take 1,000 mg by mouth every 6 (six) hours as needed.      Marland Kitchen amLODipine (NORVASC) 10 MG tablet Take 1 tablet (10 mg total) by mouth daily.  30 tablet  11  . cloNIDine (CATAPRES) 0.3 MG tablet Take 1 tablet (0.3 mg total) by mouth 3 (three) times daily.  90 tablet  11  . labetalol (NORMODYNE) 200 MG tablet Take 0.5 tablets (100 mg total) by mouth 3 (three) times daily.  90 tablet  0  . lisinopril (PRINIVIL,ZESTRIL) 10 MG tablet Take 1 tablet (10 mg total) by mouth daily.  90 tablet  3  . triamterene-hydrochlorothiazide (MAXZIDE) 75-50 MG per tablet Take 1 tablet by mouth daily.  30 tablet  3   No current  facility-administered medications for this visit.     Past Surgical History  Procedure Laterality Date  . Bilateral breast reduction  2004  . Appendectomy    . Cholecystectomy    . Removal of left ovarian cyst  2003  . Btl  2011  . Tubal ligation  2011    [facility]     No Known Allergies    Family History  Problem Relation Age of Onset  . Coronary artery disease Mother   . Hypertension Mother   . Coronary artery disease Father   . Kidney disease Father   . Hypertension Father   . Hypertension Sister   . Anesthesia problems Neg Hx   . Hypotension Neg Hx   . Malignant hyperthermia Neg Hx   . Pseudochol deficiency Neg Hx      Social History [name] reports that she has never smoked. She has never used smokeless tobacco. [name] reports that she drinks alcohol.   Review of Systems CONSTITUTIONAL: No weight loss, fever, chills, weakness or fatigue.  HEENT: Eyes: No visual loss, blurred vision, double vision or yellow sclerae.No hearing loss, sneezing, congestion, runny nose or sore throat.  SKIN: No rash or itching.  CARDIOVASCULAR:  RESPIRATORY: No shortness of breath, cough or sputum.  GASTROINTESTINAL: No anorexia, nausea, vomiting or diarrhea.  No abdominal pain or blood.  GENITOURINARY: No burning on urination, no polyuria NEUROLOGICAL: No headache, dizziness, syncope, paralysis, ataxia, numbness or tingling in the extremities. No change in bowel or bladder control.  MUSCULOSKELETAL: No muscle, back pain, joint pain or stiffness.  LYMPHATICS: No enlarged nodes. No history of splenectomy.  PSYCHIATRIC: No history of depression or anxiety.  ENDOCRINOLOGIC: No reports of sweating, cold or heat intolerance. No polyuria or polydipsia.  Marland Kitchen   Physical Examination There were no vitals filed for this visit. There were no vitals filed for this visit.  Gen: resting comfortably, no acute distress HEENT: no scleral icterus, pupils equal round and reactive, no palptable  cervical adenopathy,  CV Resp: Clear to auscultation bilaterally GI: abdomen is soft, non-tender, non-distended, normal bowel sounds, no hepatosplenomegaly MSK: extremities are warm, no edema.  Skin: warm, no rash Neuro:  no focal deficits Psych: appropriate affect   Diagnostic Studies 10/27/13 CXR  FINDINGS: Lungs are adequately inflated without focal consolidation or effusion. Cardiomediastinal silhouette is within normal. There is mild spondylosis of the spine.  IMPRESSION: No active cardiopulmonary disease.  11/02/13 Clinic EKG  Normal sinus rhythm  11/02/13 Echo Not done   Assessment and Plan  1. Dyspnea  - symptoms concerning for possible cardio etiology, given her long history of difficult to control HTN she is at risk for cardiac dysfunction (either systolic or diastolic)  - will check echo to further evaluate   2. HTN  - early onset, very difficult to control. Suggestive of possible primary hypertension. She also meets criteria for resistant HTN.  - agree with sleep apnea eval initated by PCP  - she has normal thyroid function, denies significant EtOH use  - will check renin/aldo levels and ratio  - will start lisinopril 10mg  daily (she reports history of tubal ligation with no planned further pregnancies)  - consider starting spironolactone at follow up in the setting of resistant hypertension   3. Chest pain  - atypical for angina, she does have some CAD risk factors including a family history of CAD  - pending echo results, likely consider non-invasive ischemic evaluation          Arnoldo Lenis, M.D., F.A.C.C.

## 2013-12-05 NOTE — Telephone Encounter (Signed)
Pt reschedule office visit and echocardiogram and agreed to both appt dates and times.

## 2013-12-07 ENCOUNTER — Telehealth: Payer: Self-pay | Admitting: Family Medicine

## 2013-12-08 ENCOUNTER — Other Ambulatory Visit: Payer: BC Managed Care – PPO

## 2013-12-09 ENCOUNTER — Ambulatory Visit: Payer: BC Managed Care – PPO | Admitting: Cardiology

## 2013-12-09 NOTE — Telephone Encounter (Signed)
noted 

## 2013-12-09 NOTE — Progress Notes (Unsigned)
Clinical Summary [name] is a 43 y.o.female 1. Dyspnea  - reports some SOB, DOE that started approx 3 months. Can walk approx 5 blocks before getting short of breath. Reports prior to 3 months ago she could walk further distances without troubles. Occasional orthopea, no LE edema, no PND   2. Chest pain  - tightness in midchest, 2/10. +Nausea, can feel diaporetic. No SOB or palps. Pain lasts approx 5 minutes. Can occur at rest or with exertion. Better with deep breaths. Occurs only 1-2 times a week.  CAD risk factors: HTN, mother MI in late 85s   3. HTN  - per notes chronically uncontrolled  - checks bp at home daily, typically 170s.  - reports she was in her 21s when diagnosed with HTN after second pregancy. Reports hydralazine started just earlier this month.  Labs Jan 2015: K 3.6 Cr 0.61 TSH 1.016  - being worked up for sleep apnea  - other history regarding primary hypertension, she is being worked up for sleep apnea. She denies any significant alcohol use. No history of kidney disease.   - last visit ordered renin/aldo which has not been done yet  Past Medical History  Diagnosis Date  . Obesity   . Hypertension   . Fibroid      No Known Allergies   Current Outpatient Prescriptions  Medication Sig Dispense Refill  . acetaminophen (TYLENOL) 500 MG tablet Take 1,000 mg by mouth every 6 (six) hours as needed.      Marland Kitchen amLODipine (NORVASC) 10 MG tablet Take 1 tablet (10 mg total) by mouth daily.  30 tablet  11  . cloNIDine (CATAPRES) 0.3 MG tablet Take 1 tablet (0.3 mg total) by mouth 3 (three) times daily.  90 tablet  11  . labetalol (NORMODYNE) 200 MG tablet Take 0.5 tablets (100 mg total) by mouth 3 (three) times daily.  90 tablet  0  . lisinopril (PRINIVIL,ZESTRIL) 10 MG tablet Take 1 tablet (10 mg total) by mouth daily.  90 tablet  3  . triamterene-hydrochlorothiazide (MAXZIDE) 75-50 MG per tablet Take 1 tablet by mouth daily.  30 tablet  3   No current  facility-administered medications for this visit.     Past Surgical History  Procedure Laterality Date  . Bilateral breast reduction  2004  . Appendectomy    . Cholecystectomy    . Removal of left ovarian cyst  2003  . Btl  2011  . Tubal ligation  2011    [facility]     No Known Allergies    Family History  Problem Relation Age of Onset  . Coronary artery disease Mother   . Hypertension Mother   . Coronary artery disease Father   . Kidney disease Father   . Hypertension Father   . Hypertension Sister   . Anesthesia problems Neg Hx   . Hypotension Neg Hx   . Malignant hyperthermia Neg Hx   . Pseudochol deficiency Neg Hx      Social History [name] reports that she has never smoked. She has never used smokeless tobacco. [name] reports that she drinks alcohol.   Review of Systems CONSTITUTIONAL: No weight loss, fever, chills, weakness or fatigue.  HEENT: Eyes: No visual loss, blurred vision, double vision or yellow sclerae.No hearing loss, sneezing, congestion, runny nose or sore throat.  SKIN: No rash or itching.  CARDIOVASCULAR:  RESPIRATORY: No shortness of breath, cough or sputum.  GASTROINTESTINAL: No anorexia, nausea, vomiting or diarrhea.  No abdominal pain or blood.  GENITOURINARY: No burning on urination, no polyuria NEUROLOGICAL: No headache, dizziness, syncope, paralysis, ataxia, numbness or tingling in the extremities. No change in bowel or bladder control.  MUSCULOSKELETAL: No muscle, back pain, joint pain or stiffness.  LYMPHATICS: No enlarged nodes. No history of splenectomy.  PSYCHIATRIC: No history of depression or anxiety.  ENDOCRINOLOGIC: No reports of sweating, cold or heat intolerance. No polyuria or polydipsia.  Marland Kitchen   Physical Examination There were no vitals filed for this visit. There were no vitals filed for this visit.  Gen: resting comfortably, no acute distress HEENT: no scleral icterus, pupils equal round and reactive, no palptable  cervical adenopathy,  CV Resp: Clear to auscultation bilaterally GI: abdomen is soft, non-tender, non-distended, normal bowel sounds, no hepatosplenomegaly MSK: extremities are warm, no edema.  Skin: warm, no rash Neuro:  no focal deficits Psych: appropriate affect   Diagnostic Studies 10/27/13 CXR  FINDINGS: Lungs are adequately inflated without focal consolidation or effusion. Cardiomediastinal silhouette is within normal. There is mild spondylosis of the spine.  IMPRESSION: No active cardiopulmonary disease.  11/02/13 Clinic EKG  Normal sinus rhythm   11/02/13 Echo  Not done     Assessment and Plan   1. Dyspnea  - symptoms concerning for possible cardio etiology, given her long history of difficult to control HTN she is at risk for cardiac dysfunction (either systolic or diastolic)  - will check echo to further evaluate   2. HTN  - early onset, very difficult to control. Suggestive of possible primary hypertension. She also meets criteria for resistant HTN.  - agree with sleep apnea eval initated by PCP  - she has normal thyroid function, denies significant EtOH use  - will check renin/aldo levels and ratio  - will start lisinopril 10mg  daily (she reports history of tubal ligation with no planned further pregnancies)  - consider starting spironolactone at follow up in the setting of resistant hypertension   3. Chest pain  - atypical for angina, she does have some CAD risk factors including a family history of CAD  - pending echo results, likely consider non-invasive ischemic evaluation       Arnoldo Lenis, M.D., F.A.C.C.

## 2013-12-21 ENCOUNTER — Other Ambulatory Visit: Payer: BC Managed Care – PPO

## 2013-12-28 ENCOUNTER — Telehealth: Payer: Self-pay | Admitting: Cardiology

## 2013-12-28 NOTE — Telephone Encounter (Signed)
Pt informed me she will pick lab orders up in AM at echo appt and will have labs done after echo.

## 2013-12-28 NOTE — Telephone Encounter (Signed)
Message copied by Lewayne Bunting on Wed Dec 28, 2013  2:31 PM ------      Message from: Lewayne Bunting      Created: Thu Nov 03, 2013  2:06 PM      Regarding: Dr. Harl Bowie FU       BMET      Renin / Aldosterone      Renin / Aldosterone ratio ------

## 2013-12-29 ENCOUNTER — Encounter (INDEPENDENT_AMBULATORY_CARE_PROVIDER_SITE_OTHER): Payer: BC Managed Care – PPO | Admitting: Cardiology

## 2013-12-29 DIAGNOSIS — R079 Chest pain, unspecified: Secondary | ICD-10-CM

## 2013-12-29 DIAGNOSIS — R0602 Shortness of breath: Secondary | ICD-10-CM

## 2014-01-03 ENCOUNTER — Encounter: Payer: Self-pay | Admitting: Cardiology

## 2014-01-03 ENCOUNTER — Other Ambulatory Visit: Payer: Self-pay | Admitting: Cardiology

## 2014-01-03 ENCOUNTER — Ambulatory Visit (INDEPENDENT_AMBULATORY_CARE_PROVIDER_SITE_OTHER): Payer: BC Managed Care – PPO | Admitting: Cardiology

## 2014-01-03 VITALS — BP 118/80 | HR 91 | Wt 256.1 lb

## 2014-01-03 DIAGNOSIS — I10 Essential (primary) hypertension: Secondary | ICD-10-CM

## 2014-01-03 DIAGNOSIS — I519 Heart disease, unspecified: Secondary | ICD-10-CM

## 2014-01-03 DIAGNOSIS — I5189 Other ill-defined heart diseases: Secondary | ICD-10-CM

## 2014-01-03 DIAGNOSIS — R0989 Other specified symptoms and signs involving the circulatory and respiratory systems: Secondary | ICD-10-CM

## 2014-01-03 DIAGNOSIS — R0609 Other forms of dyspnea: Secondary | ICD-10-CM

## 2014-01-03 DIAGNOSIS — R0600 Dyspnea, unspecified: Secondary | ICD-10-CM

## 2014-01-03 DIAGNOSIS — R079 Chest pain, unspecified: Secondary | ICD-10-CM

## 2014-01-03 LAB — BASIC METABOLIC PANEL
BUN: 13 mg/dL (ref 6–23)
CO2: 26 mEq/L (ref 19–32)
Calcium: 9.1 mg/dL (ref 8.4–10.5)
Chloride: 103 mEq/L (ref 96–112)
Creat: 0.83 mg/dL (ref 0.50–1.10)
Glucose, Bld: 94 mg/dL (ref 70–99)
Potassium: 3.5 mEq/L (ref 3.5–5.3)
Sodium: 138 mEq/L (ref 135–145)

## 2014-01-03 NOTE — Progress Notes (Signed)
Clinical Summary [name] is a 43 y.o.female seen today for follow up of the following medical problems.   1. Dyspnea  - reports some DOE that started approx 3 months. Can walk approx 5 blocks before getting short of breath. Reports prior to 3 months ago she could walk further distances without troubles. Occasional orthopea, no LE edema, no PND. Symptoms unchanged since last visit.   - 12/2013 echo shows normal LV systolic function at 77-82%, grade I diastolic dysfunction, dilated IVC  2. Chest pain  - occasional tightness in midchest, 2/10. +Nausea, can feel diaporetic. No SOB or palps. Pain lasts approx 5 minutes. Can occur at rest or with exertion.   CAD risk factors: HTN, mother MI in late 94s    3. HTN  - per notes chronically uncontrolled  - reports she was in her 77s when diagnosed with HTN after second pregancy.  Labs Jan 2015: K 3.6 Cr 0.61 TSH 1.016  - A sleep study is pending. She just had her renin/aldo levels drawn and we are awaiting the results. She denies any significant alcohol use. No history of kidney disease.     Past Medical History  Diagnosis Date  . Obesity   . Hypertension   . Fibroid      No Known Allergies   Current Outpatient Prescriptions  Medication Sig Dispense Refill  . acetaminophen (TYLENOL) 500 MG tablet Take 1,000 mg by mouth every 6 (six) hours as needed.      Marland Kitchen amLODipine (NORVASC) 10 MG tablet Take 1 tablet (10 mg total) by mouth daily.  30 tablet  11  . cloNIDine (CATAPRES) 0.3 MG tablet Take 1 tablet (0.3 mg total) by mouth 3 (three) times daily.  90 tablet  11  . labetalol (NORMODYNE) 200 MG tablet Take 0.5 tablets (100 mg total) by mouth 3 (three) times daily.  90 tablet  0  . lisinopril (PRINIVIL,ZESTRIL) 10 MG tablet Take 1 tablet (10 mg total) by mouth daily.  90 tablet  3  . triamterene-hydrochlorothiazide (MAXZIDE) 75-50 MG per tablet Take 1 tablet by mouth daily.  30 tablet  3   No current facility-administered  medications for this visit.     Past Surgical History  Procedure Laterality Date  . Bilateral breast reduction  2004  . Appendectomy    . Cholecystectomy    . Removal of left ovarian cyst  2003  . Btl  2011  . Tubal ligation  2011    [facility]     No Known Allergies    Family History  Problem Relation Age of Onset  . Coronary artery disease Mother   . Hypertension Mother   . Coronary artery disease Father   . Kidney disease Father   . Hypertension Father   . Hypertension Sister   . Anesthesia problems Neg Hx   . Hypotension Neg Hx   . Malignant hyperthermia Neg Hx   . Pseudochol deficiency Neg Hx      Social History [name] reports that she has never smoked. She has never used smokeless tobacco. [name] reports that she drinks alcohol.   Review of Systems CONSTITUTIONAL: No weight loss, fever, chills, weakness or fatigue.  HEENT: Eyes: No visual loss, blurred vision, double vision or yellow sclerae.No hearing loss, sneezing, congestion, runny nose or sore throat.  SKIN: No rash or itching.  CARDIOVASCULAR: per HPI RESPIRATORY: Dyspnea  GASTROINTESTINAL: No anorexia, nausea, vomiting or diarrhea. No abdominal pain or blood.  GENITOURINARY: No  burning on urination, no polyuria NEUROLOGICAL: No headache, dizziness, syncope, paralysis, ataxia, numbness or tingling in the extremities. No change in bowel or bladder control.  MUSCULOSKELETAL: No muscle, back pain, joint pain or stiffness.  LYMPHATICS: No enlarged nodes. No history of splenectomy.  PSYCHIATRIC: No history of depression or anxiety.  ENDOCRINOLOGIC: No reports of sweating, cold or heat intolerance. No polyuria or polydipsia.  Marland Kitchen   Physical Examination p 91 bp 118/80 Wt 256 lbs Gen: resting comfortably, no acute distress HEENT: no scleral icterus, pupils equal round and reactive, no palptable cervical adenopathy,  CV: RRR, no m/r/g, no JVD, no carotid bruits Resp: Clear to auscultation  bilaterally GI: abdomen is soft, non-tender, non-distended, normal bowel sounds, no hepatosplenomegaly MSK: extremities are warm, no edema.  Skin: warm, no rash Neuro:  no focal deficits Psych: appropriate affect   Diagnostic Studies  CXR  FINDINGS: Lungs are adequately inflated without focal consolidation or effusion. Cardiomediastinal silhouette is within normal. There is mild spondylosis of the spine.  IMPRESSION: No active cardiopulmonary disease.  11/02/13 Clinic EKG  Normal sinus rhythm   12/29/13 Echo Technically difficult study, LVEF 55-60%, moderate basal septal hypertrophy, no WMAs, grade I diastolic dysfunction, dilated IVC     Assessment and Plan  1. Dyspnea  - normal LV systolic function on echo, grade I diastolic dysfunction which could be contributing - she has been having chest pain at times as well, will obtain stress test as described below.   2. HTN  - early onset, very difficult to control. Suggestive of possible primary hypertension. She also meets criteria for resistant HTN.  - agree with sleep apnea eval initated by PCP  - she has normal thyroid function, denies significant EtOH use  - awaiting renin/aldo levels and ratio  - currently her bp is well controlled  3. Chest pain  - atypical for angina, she does have some CAD risk factors including a strong family history of CAD  - will obtain exercise nuclear stress. She had poor acoustic windows on echo, thus will obtain MPI.     Follow up 6 months, earlier pending stress test results   Arnoldo Lenis, M.D., F.A.C.C.

## 2014-01-03 NOTE — Patient Instructions (Signed)
Your physician recommends that you schedule a follow-up appointment in: 6 months with Dr. Harl Bowie. You should receive a letter in the mail in 4 months. If you do not receive this letter by July 2015 call our office to schedule this appointment.   Your physician recommends that you continue on your current medications as directed. Please refer to the Current Medication list given to you today.  Your physician has requested that you have en exercise stress myoview. For further information please visit [email or web address]. Please follow instruction sheet, as given.

## 2014-01-07 LAB — ALDOSTERONE + RENIN ACTIVITY W/ RATIO
ALDO / PRA Ratio: 6.6 Ratio (ref 0.9–28.9)
Aldosterone: 9 ng/dL
PRA LC/MS/MS: 1.37 ng/mL/h (ref 0.25–5.82)

## 2014-01-09 ENCOUNTER — Telehealth: Payer: Self-pay | Admitting: Cardiology

## 2014-01-09 NOTE — Telephone Encounter (Signed)
Message copied by Lewayne Bunting on Mon Jan 09, 2014 11:58 AM ------      Message from: [place] F      Created: Mon Jan 09, 2014  9:21 AM       Please let patient know that labs look good            Carlyle Dolly MD ------

## 2014-01-09 NOTE — Telephone Encounter (Signed)
Informed pt of results. Pt verbalized understanding. 

## 2014-01-10 ENCOUNTER — Other Ambulatory Visit: Payer: Self-pay | Admitting: Cardiology

## 2014-01-10 DIAGNOSIS — R079 Chest pain, unspecified: Secondary | ICD-10-CM

## 2014-01-11 ENCOUNTER — Encounter (HOSPITAL_COMMUNITY): Payer: BC Managed Care – PPO

## 2014-01-12 ENCOUNTER — Encounter (HOSPITAL_COMMUNITY): Payer: BC Managed Care – PPO

## 2014-01-12 ENCOUNTER — Encounter: Payer: BC Managed Care – PPO | Admitting: Family Medicine

## 2014-01-18 ENCOUNTER — Ambulatory Visit (HOSPITAL_COMMUNITY): Admission: RE | Admit: 2014-01-18 | Payer: BC Managed Care – PPO | Source: Ambulatory Visit

## 2014-01-26 ENCOUNTER — Encounter: Payer: Self-pay | Admitting: Family Medicine

## 2014-01-27 ENCOUNTER — Other Ambulatory Visit: Payer: Self-pay

## 2014-01-27 MED ORDER — LABETALOL HCL 200 MG PO TABS: 100.0000 mg | ORAL_TABLET | Freq: Three times a day (TID) | ORAL | Status: DC

## 2014-02-17 NOTE — Progress Notes (Signed)
This encounter was created in error - please disregard.

## 2014-03-14 ENCOUNTER — Encounter: Payer: Self-pay | Admitting: Family Medicine

## 2014-03-14 ENCOUNTER — Encounter: Payer: BC Managed Care – PPO | Admitting: Family Medicine

## 2014-03-14 ENCOUNTER — Ambulatory Visit (INDEPENDENT_AMBULATORY_CARE_PROVIDER_SITE_OTHER): Payer: BC Managed Care – PPO | Admitting: Family Medicine

## 2014-03-14 ENCOUNTER — Other Ambulatory Visit (HOSPITAL_COMMUNITY)
Admission: RE | Admit: 2014-03-14 | Discharge: 2014-03-14 | Disposition: A | Discharge status: Home / Self Care | Payer: BC Managed Care – PPO | Source: Ambulatory Visit | Attending: Family Medicine | Admitting: Family Medicine

## 2014-03-14 VITALS — BP 160/110 | HR 68 | Resp 18 | Ht 64.0 in | Wt 254.0 lb

## 2014-03-14 DIAGNOSIS — Z0000 Encounter for general adult medical examination without abnormal findings: Secondary | ICD-10-CM

## 2014-03-14 DIAGNOSIS — Z124 Encounter for screening for malignant neoplasm of cervix: Secondary | ICD-10-CM

## 2014-03-14 DIAGNOSIS — Z1151 Encounter for screening for human papillomavirus (HPV): Secondary | ICD-10-CM | POA: Insufficient documentation

## 2014-03-14 DIAGNOSIS — I10 Essential (primary) hypertension: Secondary | ICD-10-CM

## 2014-03-14 DIAGNOSIS — Z1211 Encounter for screening for malignant neoplasm of colon: Secondary | ICD-10-CM

## 2014-03-14 DIAGNOSIS — Z01419 Encounter for gynecological examination (general) (routine) without abnormal findings: Secondary | ICD-10-CM | POA: Insufficient documentation

## 2014-03-14 LAB — POC HEMOCCULT BLD/STL (OFFICE/1-CARD/DIAGNOSTIC): Fecal Occult Blood, POC: NEGATIVE

## 2014-03-14 MED ORDER — LABETALOL HCL 200 MG PO TABS: 200.0000 mg | ORAL_TABLET | Freq: Three times a day (TID) | ORAL | Status: DC

## 2014-03-14 NOTE — Patient Instructions (Addendum)
F/u in 64month, call if you need me before  Please continue to work on healthy lifestyle with a diet rich in fresh fruit and vegetable and regular exercise  Weight loss goal of 2.5 pounds  per month  BP is high so labetalol dose is increased to 200mg  three tiimes daily  Fasting chem 7 in 3 month, before visit  PLEASE reschedule your appt with Dr Merlene Laughter  For sleep evaluation

## 2014-03-19 DIAGNOSIS — Z0000 Encounter for general adult medical examination without abnormal findings: Secondary | ICD-10-CM | POA: Insufficient documentation

## 2014-03-19 NOTE — Assessment & Plan Note (Signed)
Uncontrolled. Denies chest pain , PND or r orthopnea, denies headache localized weakness or numbness, and neuro exam is normal Increased dose of labetalol, slat restriction and DASH diet

## 2014-03-19 NOTE — Assessment & Plan Note (Signed)
Annual exam as documented. Counseling done  re healthy lifestyle involving commitment to 150 minutes exercise per week, heart healthy diet, and attaining healthy weight.The importance of adequate sleep also discussed.Needs to reschedule sleep eval Regular seat belt use and safe storage  of firearms if patient has them, is also discussed. Changes in health habits are decided on by the patient with goals and time frames  set for achieving them. Immunization and cancer screening needs are specifically addressed at this visit.

## 2014-03-19 NOTE — Progress Notes (Signed)
   Subjective:    Patient ID: [name], female    DOB: Dec 06, 1970, 43 y.o.   MRN: [id number]  HPI The patient is for an annual exam. Health maintenance is reviewed and updated, specifically  recommended,  screening tests and immunizations. Recent lab and radiologic data is reviewed also. Pt has no concerns which are new, still needs sleep study. BP at visit markedly elevated so this is also addressed. States she feela well and has no neurologic or  CV symptoms    Review of Systems See HPI        Objective:   Physical Exam Pleasant obese female, alert and oriented x 3, in no cardio-pulmonary distress. Afebrile. HEENT No facial trauma or asymetry. Sinuses non tender.  EOMI, PERTL, fundoscopic exam  no hemorhage or exudate.  External ears normal, tympanic membranes clear. Oropharynx moist, no exudate, good dentition. Neck: supple, no adenopathy,JVD or thyromegaly.No bruits.  Chest: Clear to ascultation bilaterally.No crackles or wheezes. Non tender to palpation  Breast: No asymetry,no masses or lumps. No tenderness. No nipple discharge or inversion. No axillary or supraclavicular adenopathy  Cardiovascular system; Heart sounds normal,  S1 and  S2 ,no S3.  No murmur, or thrill. Apical beat not displaced Peripheral pulses normal.  Abdomen: Soft, non tender, no organomegaly or masses. No bruits. Bowel sounds normal. No guarding, tenderness or rebound.  Rectal:  Normal sphincter tone. No mass.No rectal masses.  Guaiac negative stool.  GU: External genitalia normal female genitalia , female distribution of hair. No lesions. Urethral meatus normal in size, no  Prolapse, no lesions visibly  Present. Bladder non tender. Vagina pink and moist , with no visible lesions , discharge present . Adequate pelvic support no  cystocele or rectocele noted Cervix pink and appears healthy, no lesions or ulcerations noted, fishy  discharge noted from os Uterus normal size, no  adnexal masses, no cervical motion or adnexal tenderness.   Musculoskeletal exam: Full ROM of spine, hips , shoulders and knees. No deformity ,swelling or crepitus noted. No muscle wasting or atrophy.   Neurologic: Cranial nerves 2 to 12 intact. Power, tone ,sensation and reflexes normal throughout. No disturbance in gait. No tremor.  Skin: Intact, no ulceration, erythema , scaling or rash noted. Pigmentation normal throughout  Psych; Normal mood and affect. Judgement and concentration normal        Assessment & Plan:  Annual physical exam Annual exam as documented. Counseling done  re healthy lifestyle involving commitment to 150 minutes exercise per week, heart healthy diet, and attaining healthy weight.The importance of adequate sleep also discussed.Needs to reschedule sleep eval Regular seat belt use and safe storage  of firearms if patient has them, is also discussed. Changes in health habits are decided on by the patient with goals and time frames  set for achieving them. Immunization and cancer screening needs are specifically addressed at this visit.   Malignant hypertension Uncontrolled. Denies chest pain , PND or r orthopnea, denies headache localized weakness or numbness, and neuro exam is normal Increased dose of labetalol, slat restriction and DASH diet

## 2014-03-20 LAB — CYTOLOGY - PAP

## 2014-03-24 MED ORDER — METRONIDAZOLE 500 MG PO TABS: ORAL_TABLET | ORAL | Status: DC

## 2014-03-24 NOTE — Addendum Note (Signed)
Addended by: Eual Fines on: 03/24/2014 08:57 AM   Modules accepted: Orders

## 2014-05-17 ENCOUNTER — Encounter: Payer: BC Managed Care – PPO | Admitting: Family Medicine

## 2014-05-24 ENCOUNTER — Other Ambulatory Visit: Payer: Self-pay | Admitting: Family Medicine

## 2014-08-14 ENCOUNTER — Encounter: Payer: Self-pay | Admitting: Family Medicine

## 2014-09-20 ENCOUNTER — Telehealth: Payer: Self-pay

## 2014-09-20 DIAGNOSIS — I10 Essential (primary) hypertension: Secondary | ICD-10-CM

## 2014-09-20 DIAGNOSIS — D509 Iron deficiency anemia, unspecified: Secondary | ICD-10-CM

## 2014-09-20 NOTE — Telephone Encounter (Signed)
Order mailed to patient for labs.

## 2014-11-03 ENCOUNTER — Other Ambulatory Visit: Payer: Self-pay | Admitting: Family Medicine

## 2014-11-03 ENCOUNTER — Encounter (HOSPITAL_COMMUNITY): Payer: Self-pay | Admitting: *Deleted

## 2014-11-03 ENCOUNTER — Emergency Department (HOSPITAL_COMMUNITY)
Admission: EM | Admit: 2014-11-03 | Discharge: 2014-11-03 | Disposition: A | Discharge status: Home / Self Care | Payer: BLUE CROSS/BLUE SHIELD | Attending: Emergency Medicine | Admitting: Emergency Medicine

## 2014-11-03 DIAGNOSIS — E669 Obesity, unspecified: Secondary | ICD-10-CM | POA: Insufficient documentation

## 2014-11-03 DIAGNOSIS — K529 Noninfective gastroenteritis and colitis, unspecified: Secondary | ICD-10-CM | POA: Insufficient documentation

## 2014-11-03 DIAGNOSIS — R51 Headache: Secondary | ICD-10-CM | POA: Insufficient documentation

## 2014-11-03 DIAGNOSIS — Z3202 Encounter for pregnancy test, result negative: Secondary | ICD-10-CM | POA: Insufficient documentation

## 2014-11-03 DIAGNOSIS — Z79899 Other long term (current) drug therapy: Secondary | ICD-10-CM | POA: Diagnosis not present

## 2014-11-03 DIAGNOSIS — Z9089 Acquired absence of other organs: Secondary | ICD-10-CM | POA: Diagnosis not present

## 2014-11-03 DIAGNOSIS — I10 Essential (primary) hypertension: Secondary | ICD-10-CM | POA: Diagnosis not present

## 2014-11-03 DIAGNOSIS — R109 Unspecified abdominal pain: Secondary | ICD-10-CM | POA: Diagnosis present

## 2014-11-03 DIAGNOSIS — Z9049 Acquired absence of other specified parts of digestive tract: Secondary | ICD-10-CM | POA: Diagnosis not present

## 2014-11-03 LAB — URINALYSIS, ROUTINE W REFLEX MICROSCOPIC
Bilirubin Urine: NEGATIVE
Glucose, UA: NEGATIVE mg/dL
Hgb urine dipstick: NEGATIVE
Ketones, ur: 15 mg/dL — AB
Leukocytes, UA: NEGATIVE
Nitrite: NEGATIVE
Protein, ur: NEGATIVE mg/dL
Specific Gravity, Urine: 1.025 (ref 1.005–1.030)
Urobilinogen, UA: 1 mg/dL (ref 0.0–1.0)
pH: 6 (ref 5.0–8.0)

## 2014-11-03 LAB — COMPREHENSIVE METABOLIC PANEL
ALT: 32 U/L (ref 0–35)
AST: 27 U/L (ref 0–37)
Albumin: 3.4 g/dL — ABNORMAL LOW (ref 3.5–5.2)
Alkaline Phosphatase: 128 U/L — ABNORMAL HIGH (ref 39–117)
Anion gap: 6 (ref 5–15)
BUN: 10 mg/dL (ref 6–23)
CO2: 25 mmol/L (ref 19–32)
Calcium: 8.2 mg/dL — ABNORMAL LOW (ref 8.4–10.5)
Chloride: 106 mmol/L (ref 96–112)
Creatinine, Ser: 0.74 mg/dL (ref 0.50–1.10)
GFR calc Af Amer: >90 mL/min (ref >90)
GFR calc non Af Amer: >90 mL/min (ref >90)
Glucose, Bld: 100 mg/dL — ABNORMAL HIGH (ref 70–99)
Potassium: 3.2 mmol/L — ABNORMAL LOW (ref 3.5–5.1)
Sodium: 137 mmol/L (ref 135–145)
Total Bilirubin: 1.3 mg/dL — ABNORMAL HIGH (ref 0.3–1.2)
Total Protein: 6.8 g/dL (ref 6.0–8.3)

## 2014-11-03 LAB — CBC WITH DIFFERENTIAL/PLATELET
Basophils Absolute: 0 10*3/uL (ref 0.0–0.1)
Basophils Relative: 0 % (ref 0–1)
Eosinophils Absolute: 0 10*3/uL (ref 0.0–0.7)
Eosinophils Relative: 0 % (ref 0–5)
HCT: 37.2 % (ref 36.0–46.0)
Hemoglobin: 12 g/dL (ref 12.0–15.0)
Lymphocytes Relative: 12 % (ref 12–46)
Lymphs Abs: 0.7 10*3/uL (ref 0.7–4.0)
MCH: 28.3 pg (ref 26.0–34.0)
MCHC: 32.3 g/dL (ref 30.0–36.0)
MCV: 87.7 fL (ref 78.0–100.0)
Monocytes Absolute: 0.2 10*3/uL (ref 0.1–1.0)
Monocytes Relative: 4 % (ref 3–12)
Neutro Abs: 5.2 10*3/uL (ref 1.7–7.7)
Neutrophils Relative %: 84 % — ABNORMAL HIGH (ref 43–77)
Platelets: 239 10*3/uL (ref 150–400)
RBC: 4.24 MIL/uL (ref 3.87–5.11)
RDW: 14.4 % (ref 11.5–15.5)
WBC: 6.2 10*3/uL (ref 4.0–10.5)

## 2014-11-03 LAB — LIPASE, BLOOD: Lipase: 20 U/L (ref 11–59)

## 2014-11-03 LAB — PREGNANCY, URINE: Preg Test, Ur: NEGATIVE

## 2014-11-03 MED ORDER — HYDROCODONE-ACETAMINOPHEN 5-325 MG PO TABS: 1.0000 | ORAL_TABLET | ORAL | Status: DC | PRN

## 2014-11-03 MED ORDER — SODIUM CHLORIDE 0.9 % IV SOLN: 1000.0000 mL | Freq: Once | INTRAVENOUS | Status: DC

## 2014-11-03 MED ORDER — ONDANSETRON HCL 4 MG/2ML IJ SOLN
4.0000 mg | Freq: Once | INTRAMUSCULAR | Status: AC
  Administered 2014-11-03: 4 mg via INTRAMUSCULAR
  Filled 2014-11-03: qty 2

## 2014-11-03 MED ORDER — ONDANSETRON HCL 4 MG PO TABS: 4.0000 mg | ORAL_TABLET | Freq: Four times a day (QID) | ORAL | Status: DC

## 2014-11-03 MED ORDER — HYDROMORPHONE HCL 1 MG/ML IJ SOLN
0.5000 mg | Freq: Once | INTRAMUSCULAR | Status: AC
  Administered 2014-11-03: 0.5 mg via INTRAVENOUS
  Filled 2014-11-03: qty 1

## 2014-11-03 MED ORDER — SODIUM CHLORIDE 0.9 % IV SOLN: 1000.0000 mL | INTRAVENOUS | Status: DC

## 2014-11-03 MED ORDER — SODIUM CHLORIDE 0.9 % IV SOLN
1000.0000 mL | INTRAVENOUS | Status: DC
  Administered 2014-11-03: 1000 mL via INTRAVENOUS

## 2014-11-03 MED ORDER — ONDANSETRON HCL 4 MG/2ML IJ SOLN
4.0000 mg | Freq: Once | INTRAMUSCULAR | Status: AC
  Administered 2014-11-03: 4 mg via INTRAVENOUS
  Filled 2014-11-03: qty 2

## 2014-11-03 MED ORDER — SODIUM CHLORIDE 0.9 % IV SOLN
1000.0000 mL | Freq: Once | INTRAVENOUS | Status: AC
  Administered 2014-11-03: 1000 mL via INTRAVENOUS

## 2014-11-03 NOTE — ED Notes (Signed)
Pt. Ambulatory to restroom.

## 2014-11-03 NOTE — ED Notes (Signed)
Patient's ride is on the way.

## 2014-11-03 NOTE — ED Notes (Signed)
[facility]PA aware of patient' s BP.

## 2014-11-03 NOTE — ED Notes (Addendum)
Patient c/o abd pain since yesterday; n/v/d

## 2014-11-03 NOTE — ED Provider Notes (Signed)
CSN: [id number]     Arrival date & time 11/03/14  1127 History   First MD Initiated Contact with Patient 11/03/14 1135     Chief Complaint  Patient presents with  . Abdominal Pain     (Consider location/radiation/quality/duration/timing/severity/associated sxs/prior Treatment) HPI Comments: Patient is a 44 year old female who presents to the emergency department with complaint of abdominal pain, nausea, vomiting, and diarrhea. The patient states that on yesterday she started having some abdominal pain. She had 6 episodes of vomiting on yesterday, 10 episodes of vomiting today. She's had 8 episodes of diarrhea since it started on yesterday up until this time. On last night she had a temperature elevation of 102. She was able to get this down with Tylenol and ibuprofen, but states that she had problems keeping these medications down. She has tried clear liquids but can't keep these down either. She's not had any new medicines. She's not had any foods that she thought was ill prepared. She's not been out of the country recently.  The history is provided by the patient.    Past Medical History  Diagnosis Date  . Obesity   . Hypertension   . Fibroid    Past Surgical History  Procedure Laterality Date  . Bilateral breast reduction  2004  . Appendectomy    . Cholecystectomy    . Removal of left ovarian cyst  2003  . Btl  2011  . Tubal ligation  2011    Women's   Family History  Problem Relation Age of Onset  . Coronary artery disease Mother   . Hypertension Mother   . Coronary artery disease Father   . Kidney disease Father   . Hypertension Father   . Hypertension Sister   . Anesthesia problems Neg Hx   . Hypotension Neg Hx   . Malignant hyperthermia Neg Hx   . Pseudochol deficiency Neg Hx    History  Substance Use Topics  . Smoking status: Never Smoker   . Smokeless tobacco: Never Used  . Alcohol Use: Yes     Comment: rarely   OB History    Gravida Para Term Preterm AB  TAB SAB Ectopic Multiple Living   6 4  4 1  1   4      Review of Systems  Constitutional: Negative for activity change.       All ROS Neg except as noted in HPI  Eyes: Negative for photophobia and discharge.  Respiratory: Negative for cough, shortness of breath and wheezing.   Cardiovascular: Negative for chest pain and palpitations.  Gastrointestinal: Positive for nausea, vomiting and diarrhea. Negative for abdominal pain and blood in stool.  Genitourinary: Negative for dysuria, frequency and hematuria.  Musculoskeletal: Negative for back pain, arthralgias and neck pain.  Skin: Negative.   Neurological: Positive for headaches. Negative for dizziness, seizures and speech difficulty.  Psychiatric/Behavioral: Negative for hallucinations and confusion.      Allergies  Review of patient's allergies indicates no known allergies.  Home Medications   Prior to Admission medications   Medication Sig Start Date End Date Taking? Authorizing Provider  bismuth subsalicylate (PEPTO BISMOL) 262 MG chewable tablet Chew 524 mg by mouth as needed for indigestion or diarrhea or loose stools.   Yes Historical Provider, MD  cloNIDine (CATAPRES) 0.3 MG tablet Take 1 tablet (0.3 mg total) by mouth 3 (three) times daily. 10/27/13 11/03/14 Yes Fayrene Helper, MD  labetalol (NORMODYNE) 200 MG tablet Take 1 tablet (200 mg total) by mouth  3 (three) times daily. 03/14/14  Yes Fayrene Helper, MD  lisinopril (PRINIVIL,ZESTRIL) 10 MG tablet Take 10 mg by mouth at bedtime.  11/11/13  Yes Fayrene Helper, MD  triamterene-hydrochlorothiazide (MAXZIDE) 75-50 MG per tablet TAKE ONE TABLET BY MOUTH ONCE DAILY 05/25/14  Yes Fayrene Helper, MD  acetaminophen (TYLENOL) 500 MG tablet Take 1,000 mg by mouth every 6 (six) hours as needed.    Historical Provider, MD  metroNIDAZOLE (FLAGYL) 500 MG tablet Take 4 tablets by mouth once Patient not taking: Reported on 11/03/2014 03/24/14   Fayrene Helper, MD   BP  177/116 mmHg  Pulse 98  Temp(Src) 98.5 F (36.9 C) (Oral)  Resp 21  Ht 5\' 4"  (1.626 m)  Wt 263 lb (119.296 kg)  BMI 45.12 kg/m2  SpO2 95%  LMP 10/23/2014 Physical Exam  Constitutional: She is oriented to person, place, and time. She appears well-developed and well-nourished.  Non-toxic appearance.  HENT:  Head: Normocephalic.  Right Ear: Tympanic membrane and external ear normal.  Left Ear: Tympanic membrane and external ear normal.  Eyes: EOM and lids are normal. Pupils are equal, round, and reactive to light.  Neck: Normal range of motion. Neck supple. Carotid bruit is not present.  Cardiovascular: Normal rate, regular rhythm, normal heart sounds, intact distal pulses and normal pulses.   Pulmonary/Chest: Breath sounds normal. No respiratory distress.  Abdominal: Soft. Bowel sounds are normal. She exhibits no distension, no fluid wave, no ascites and no mass. There is no hepatosplenomegaly. There is tenderness in the right upper quadrant, epigastric area and left lower quadrant. There is no rigidity, no rebound and no guarding.    Musculoskeletal: Normal range of motion.  Lymphadenopathy:       Head (right side): No submandibular adenopathy present.       Head (left side): No submandibular adenopathy present.    She has no cervical adenopathy.  Neurological: She is alert and oriented to person, place, and time. She has normal strength. No cranial nerve deficit or sensory deficit.  Skin: Skin is warm and dry.  Psychiatric: She has a normal mood and affect. Her speech is normal.  Nursing note and vitals reviewed.   ED Course  Procedures (including critical care time) Labs Review Labs Reviewed  CBC WITH DIFFERENTIAL - Abnormal; Notable for the following:    Neutrophils Relative % 84 (*)    All other components within normal limits  COMPREHENSIVE METABOLIC PANEL - Abnormal; Notable for the following:    Potassium 3.2 (*)    Glucose, Bld 100 (*)    Calcium 8.2 (*)    Albumin  3.4 (*)    Alkaline Phosphatase 128 (*)    Total Bilirubin 1.3 (*)    All other components within normal limits  URINALYSIS, ROUTINE W REFLEX MICROSCOPIC - Abnormal; Notable for the following:    Ketones, ur 15 (*)    All other components within normal limits  LIPASE, BLOOD  PREGNANCY, URINE    Imaging Review No results found.   EKG Interpretation   Date/Time:  Friday November 03 2014 11:53:23 EST Ventricular Rate:  94 PR Interval:  181 QRS Duration: 81 QT Interval:  352 QTC Calculation: 440 R Axis:   51 Text Interpretation:  Sinus rhythm Confirmed by Denetra Formoso  MD, Luanna Weesner ([id number])  on 11/03/2014 12:58:16 PM      MDM Blood pressure elevated. Heart rate 101. Vital signs otherwise within normal limits. Patient treated initially with IV Zofran and Dilaudid.  Complete  blood count is well within normal limits. Components of metabolic panel reveals potassium be slightly low at 3.2, albumin is low at 3.4, the alkaline phosphatase is high at 128, the total bilirubin is slightly elevated at 1.3. The lipase is within normal limits at 20. Urine pregnancy test is negative. Urinalysis shows 15 mg/daL of ketone, otherwise within normal limits.   2:20 pm.  Nausea improved. Pain improved, but headache still present. Will give another liter of fluid and dose of dilaudid.  Discharge the pain is significantly improved. His been no active vomiting or diarrhea in the emergency department. Patient is been able to take liquids without throwing up.  The plan at this time is for the patient be treated with Zofran every 6 hours. Prescription is also given for Norco every 4 hours if needed for pain and aching. The patient is been given instructions on good handwashing, and not sharing utensils until this has resolved. The patient acknowledges understanding of these discharge plans.    Final diagnoses:  Gastroenteritis  Medical screening examination/treatment/procedure(s) were performed by non-physician  practitioner and as supervising physician I was immediately available for consultation/collaboration.   EKG Interpretation   Date/Time:  Friday November 03 2014 11:53:23 EST Ventricular Rate:  94 PR Interval:  181 QRS Duration: 81 QT Interval:  352 QTC Calculation: 440 R Axis:   51 Text Interpretation:  Sinus rhythm Confirmed by Daila Elbert  MD, Alpha Mysliwiec [phone])  on 11/03/2014 12:58:16 PM          Lenox Ahr, PA-C 11/03/14 [place], MD 11/17/14 [phone]

## 2014-11-03 NOTE — ED Notes (Signed)
ED PA at bedside

## 2014-11-03 NOTE — Discharge Instructions (Signed)
Your testing your examination suggest a gastroenteritis. Please wash hands frequently. Please increase fluids. Use Zofran every 6 hours if needed for nausea. Use Norco every 4 hours if needed for headache or other bodily aching. Please see your primary physician, or return to the emergency department if not improving. Your blood pressure has been elevated during your stay here in the emergency department. Review of your records reveals malignant hypertension. Please have this rechecked next week by Dr. Moshe Cipro. Please return to the emergency department immediately if any changes, problems, or concerns. Viral Gastroenteritis Viral gastroenteritis is also known as stomach flu. This condition affects the stomach and intestinal tract. It can cause sudden diarrhea and vomiting. The illness typically lasts 3 to 8 days. Most people develop an immune response that eventually gets rid of the virus. While this natural response develops, the virus can make you quite ill. CAUSES  Many different viruses can cause gastroenteritis, such as rotavirus or noroviruses. You can catch one of these viruses by consuming contaminated food or water. You may also catch a virus by sharing utensils or other personal items with an infected person or by touching a contaminated surface. SYMPTOMS  The most common symptoms are diarrhea and vomiting. These problems can cause a severe loss of body fluids (dehydration) and a body salt (electrolyte) imbalance. Other symptoms may include:  Fever.  Headache.  Fatigue.  Abdominal pain. DIAGNOSIS  Your caregiver can usually diagnose viral gastroenteritis based on your symptoms and a physical exam. A stool sample may also be taken to test for the presence of viruses or other infections. TREATMENT  This illness typically goes away on its own. Treatments are aimed at rehydration. The most serious cases of viral gastroenteritis involve vomiting so severely that you are not able to keep fluids  down. In these cases, fluids must be given through an intravenous line (IV). HOME CARE INSTRUCTIONS   Drink enough fluids to keep your urine clear or pale yellow. Drink small amounts of fluids frequently and increase the amounts as tolerated.  Ask your caregiver for specific rehydration instructions.  Avoid:  Foods high in sugar.  Alcohol.  Carbonated drinks.  Tobacco.  Juice.  Caffeine drinks.  Extremely hot or cold fluids.  Fatty, greasy foods.  Too much intake of anything at one time.  Dairy products until 24 to 48 hours after diarrhea stops.  You may consume probiotics. Probiotics are active cultures of beneficial bacteria. They may lessen the amount and number of diarrheal stools in adults. Probiotics can be found in yogurt with active cultures and in supplements.  Wash your hands well to avoid spreading the virus.  Only take over-the-counter or prescription medicines for pain, discomfort, or fever as directed by your caregiver. Do not give aspirin to children. Antidiarrheal medicines are not recommended.  Ask your caregiver if you should continue to take your regular prescribed and over-the-counter medicines.  Keep all follow-up appointments as directed by your caregiver. SEEK IMMEDIATE MEDICAL CARE IF:   You are unable to keep fluids down.  You do not urinate at least once every 6 to 8 hours.  You develop shortness of breath.  You notice blood in your stool or vomit. This may look like coffee grounds.  You have abdominal pain that increases or is concentrated in one small area (localized).  You have persistent vomiting or diarrhea.  You have a fever.  The patient is a child younger than 3 months, and he or she has a fever.  The patient is a child older than 3 months, and he or she has a fever and persistent symptoms.  The patient is a child older than 3 months, and he or she has a fever and symptoms suddenly get worse.  The patient is a baby, and he  or she has no tears when crying. MAKE SURE YOU:   Understand these instructions.  Will watch your condition.  Will get help right away if you are not doing well or get worse. Document Released: 09/29/2005 Document Revised: 12/22/2011 Document Reviewed: 07/16/2011 [facility] Patient Information 2015 [place], [place]. This information is not intended to replace advice given to you by your health care provider. Make sure you discuss any questions you have with your health care provider.

## 2014-11-07 ENCOUNTER — Encounter: Payer: Self-pay | Admitting: Family Medicine

## 2014-11-07 ENCOUNTER — Ambulatory Visit (INDEPENDENT_AMBULATORY_CARE_PROVIDER_SITE_OTHER): Payer: BLUE CROSS/BLUE SHIELD | Admitting: Family Medicine

## 2014-11-07 ENCOUNTER — Emergency Department (HOSPITAL_COMMUNITY): Payer: BLUE CROSS/BLUE SHIELD

## 2014-11-07 ENCOUNTER — Emergency Department (HOSPITAL_COMMUNITY)
Admission: EM | Admit: 2014-11-07 | Discharge: 2014-11-07 | Disposition: A | Discharge status: Home / Self Care | Payer: BLUE CROSS/BLUE SHIELD | Attending: Emergency Medicine | Admitting: Emergency Medicine

## 2014-11-07 ENCOUNTER — Encounter (HOSPITAL_COMMUNITY): Payer: Self-pay | Admitting: Emergency Medicine

## 2014-11-07 VITALS — BP 178/110 | HR 79 | Resp 16 | Ht 64.0 in | Wt 263.0 lb

## 2014-11-07 DIAGNOSIS — Z9049 Acquired absence of other specified parts of digestive tract: Secondary | ICD-10-CM | POA: Diagnosis not present

## 2014-11-07 DIAGNOSIS — Z9851 Tubal ligation status: Secondary | ICD-10-CM | POA: Diagnosis not present

## 2014-11-07 DIAGNOSIS — K529 Noninfective gastroenteritis and colitis, unspecified: Secondary | ICD-10-CM

## 2014-11-07 DIAGNOSIS — R109 Unspecified abdominal pain: Secondary | ICD-10-CM

## 2014-11-07 DIAGNOSIS — Z8742 Personal history of other diseases of the female genital tract: Secondary | ICD-10-CM | POA: Insufficient documentation

## 2014-11-07 DIAGNOSIS — I10 Essential (primary) hypertension: Secondary | ICD-10-CM | POA: Insufficient documentation

## 2014-11-07 DIAGNOSIS — Z23 Encounter for immunization: Secondary | ICD-10-CM

## 2014-11-07 DIAGNOSIS — E669 Obesity, unspecified: Secondary | ICD-10-CM | POA: Diagnosis not present

## 2014-11-07 DIAGNOSIS — Z79899 Other long term (current) drug therapy: Secondary | ICD-10-CM | POA: Diagnosis not present

## 2014-11-07 DIAGNOSIS — R1084 Generalized abdominal pain: Secondary | ICD-10-CM

## 2014-11-07 DIAGNOSIS — Z3202 Encounter for pregnancy test, result negative: Secondary | ICD-10-CM | POA: Insufficient documentation

## 2014-11-07 LAB — URINALYSIS, ROUTINE W REFLEX MICROSCOPIC
Bilirubin Urine: NEGATIVE
Glucose, UA: NEGATIVE mg/dL
Ketones, ur: NEGATIVE mg/dL
Leukocytes, UA: NEGATIVE
Nitrite: NEGATIVE
Protein, ur: NEGATIVE mg/dL
Specific Gravity, Urine: 1.015 (ref 1.005–1.030)
Urobilinogen, UA: 0.2 mg/dL (ref 0.0–1.0)
pH: 5.5 (ref 5.0–8.0)

## 2014-11-07 LAB — CBC WITH DIFFERENTIAL/PLATELET
Basophils Absolute: 0 10*3/uL (ref 0.0–0.1)
Basophils Relative: 0 % (ref 0–1)
Eosinophils Absolute: 0.1 10*3/uL (ref 0.0–0.7)
Eosinophils Relative: 1 % (ref 0–5)
HCT: 36 % (ref 36.0–46.0)
Hemoglobin: 11.4 g/dL — ABNORMAL LOW (ref 12.0–15.0)
Lymphocytes Relative: 26 % (ref 12–46)
Lymphs Abs: 2.1 10*3/uL (ref 0.7–4.0)
MCH: 27.4 pg (ref 26.0–34.0)
MCHC: 31.7 g/dL (ref 30.0–36.0)
MCV: 86.5 fL (ref 78.0–100.0)
Monocytes Absolute: 0.6 10*3/uL (ref 0.1–1.0)
Monocytes Relative: 7 % (ref 3–12)
Neutro Abs: 5.5 10*3/uL (ref 1.7–7.7)
Neutrophils Relative %: 66 % (ref 43–77)
Platelets: 317 10*3/uL (ref 150–400)
RBC: 4.16 MIL/uL (ref 3.87–5.11)
RDW: 14 % (ref 11.5–15.5)
WBC: 8.4 10*3/uL (ref 4.0–10.5)

## 2014-11-07 LAB — COMPREHENSIVE METABOLIC PANEL
ALT: 27 U/L (ref 0–35)
AST: 30 U/L (ref 0–37)
Albumin: 3.7 g/dL (ref 3.5–5.2)
Alkaline Phosphatase: 109 U/L (ref 39–117)
Anion gap: 5 (ref 5–15)
BUN: 11 mg/dL (ref 6–23)
CO2: 28 mmol/L (ref 19–32)
Calcium: 9 mg/dL (ref 8.4–10.5)
Chloride: 102 mmol/L (ref 96–112)
Creatinine, Ser: 0.74 mg/dL (ref 0.50–1.10)
GFR calc Af Amer: >90 mL/min (ref >90)
GFR calc non Af Amer: >90 mL/min (ref >90)
Glucose, Bld: 99 mg/dL (ref 70–99)
Potassium: 3.1 mmol/L — ABNORMAL LOW (ref 3.5–5.1)
Sodium: 135 mmol/L (ref 135–145)
Total Bilirubin: 1.1 mg/dL (ref 0.3–1.2)
Total Protein: 7.1 g/dL (ref 6.0–8.3)

## 2014-11-07 LAB — URINE MICROSCOPIC-ADD ON

## 2014-11-07 LAB — PREGNANCY, URINE: Preg Test, Ur: NEGATIVE

## 2014-11-07 LAB — LIPASE, BLOOD: Lipase: 24 U/L (ref 11–59)

## 2014-11-07 IMAGING — CT CT ABD-PELV W/ CM
2 of 5 series · 17 of 46 positions shown, 19 images · IV contrast (omnipaque)
Comparison: [DATE]

CLINICAL DATA: Abdominal pain and tenderness. Recently diagnosed
with gastritis.

EXAM:
CT ABDOMEN AND PELVIS WITH CONTRAST
TECHNIQUE: Multidetector CT imaging of the abdomen and pelvis was performed
using the standard protocol following bolus administration of
intravenous contrast.
CONTRAST:  25mL OMNIPAQUE IOHEXOL 300 MG/ML SOLN, 100mL OMNIPAQUE
IOHEXOL 300 MG/ML SOLN

[Series 2: abd_pel_with 5.0 b40s · axial · 0.91mm/px · z∈[-464,-54]mm · 14 of 94 slices shown, 16 images]
[im 6/94  soft-tissue]
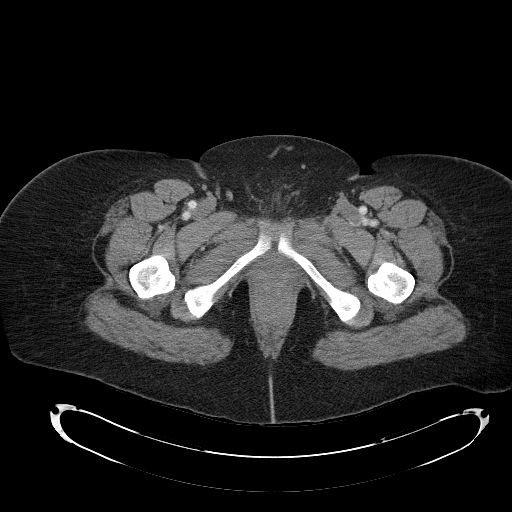
[im 6/94  bone]
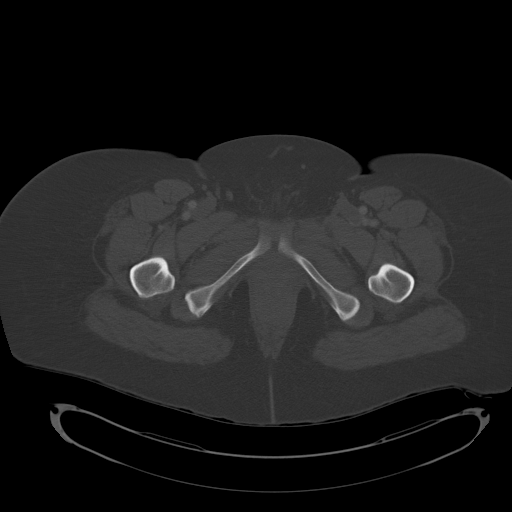
[im 11/94  soft-tissue]
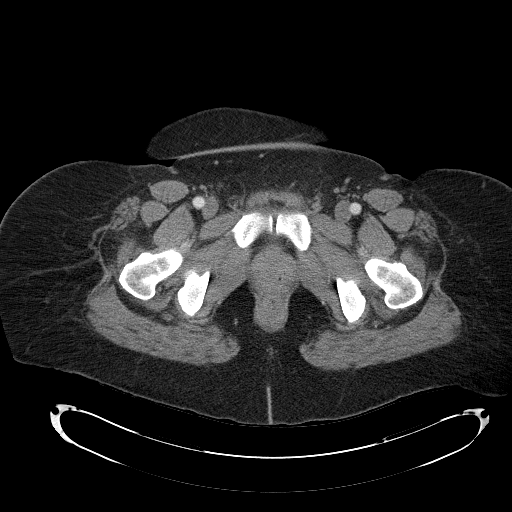
[im 17/94  soft-tissue]
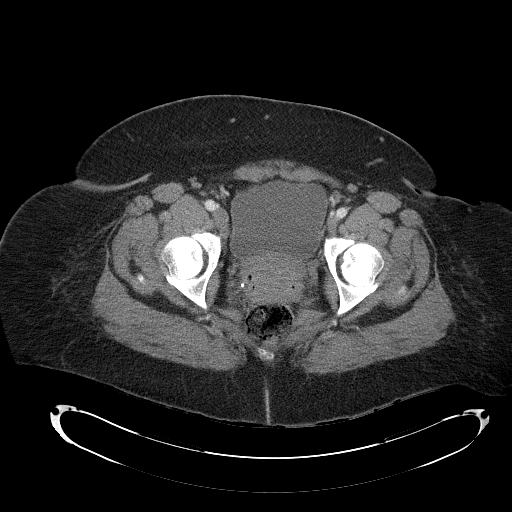
[im 28/94  soft-tissue]
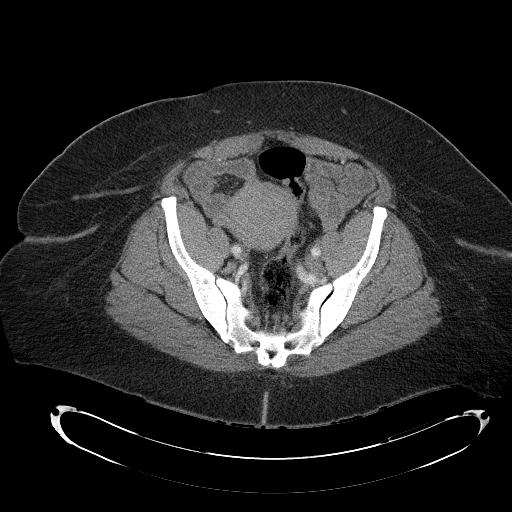
[im 33/94  soft-tissue]
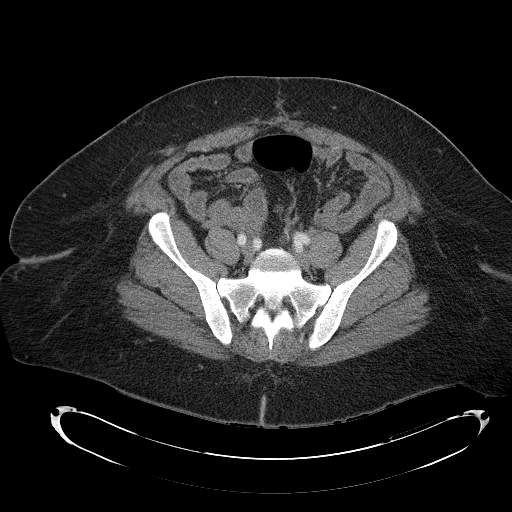
[im 39/94  soft-tissue]
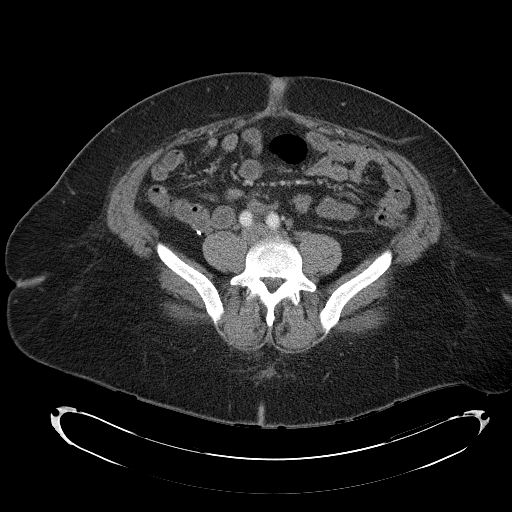
[im 44/94  soft-tissue]
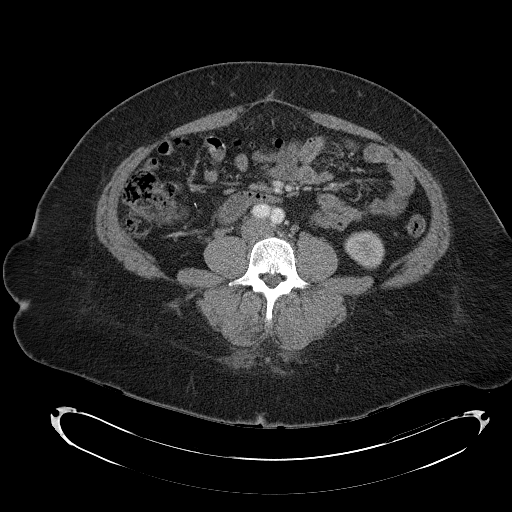
[im 50/94  soft-tissue]
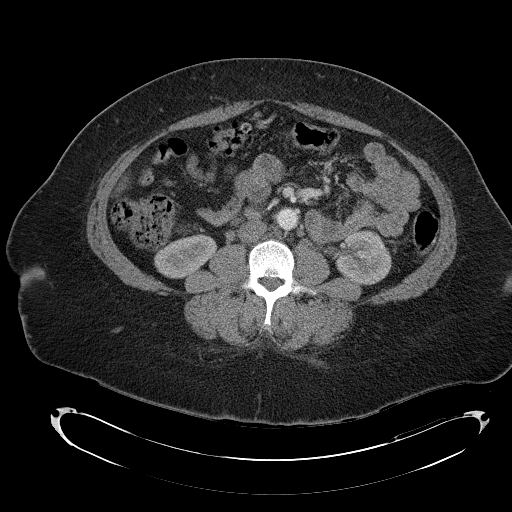
[im 55/94  soft-tissue]
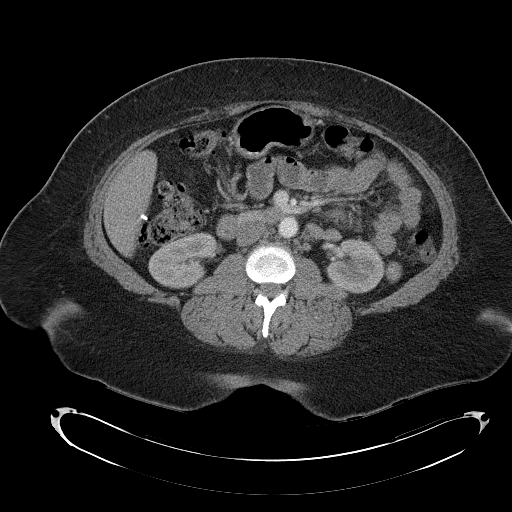
[im 55/94  bone]
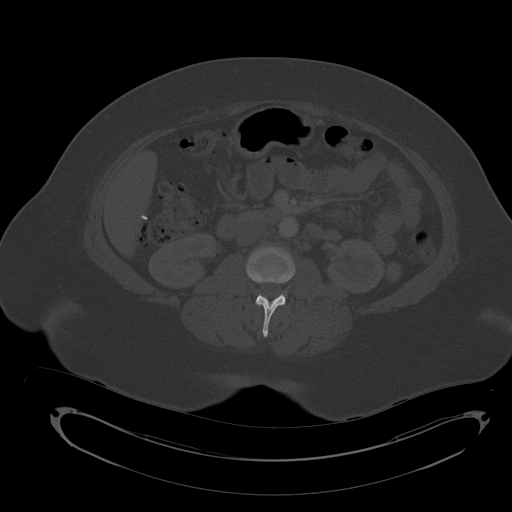
[im 61/94  soft-tissue]
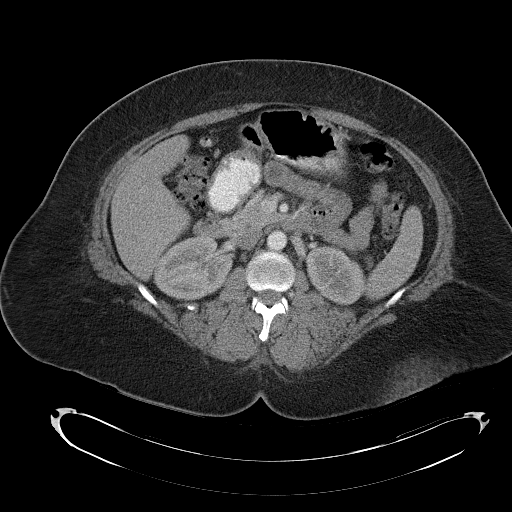
[im 72/94  soft-tissue]
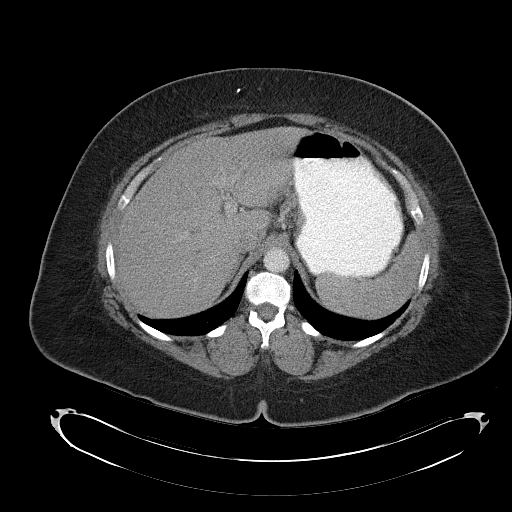
[im 77/94  soft-tissue]
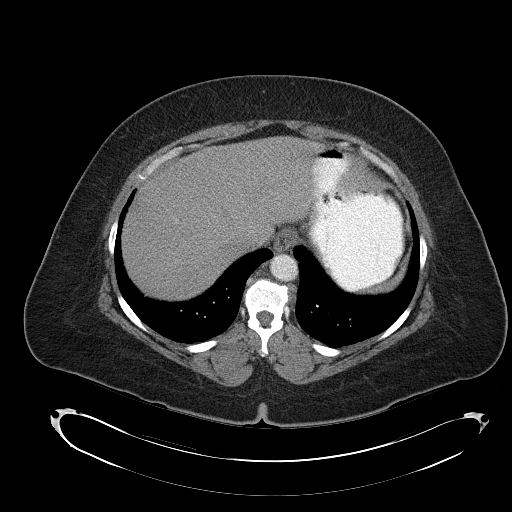
[im 83/94  soft-tissue]
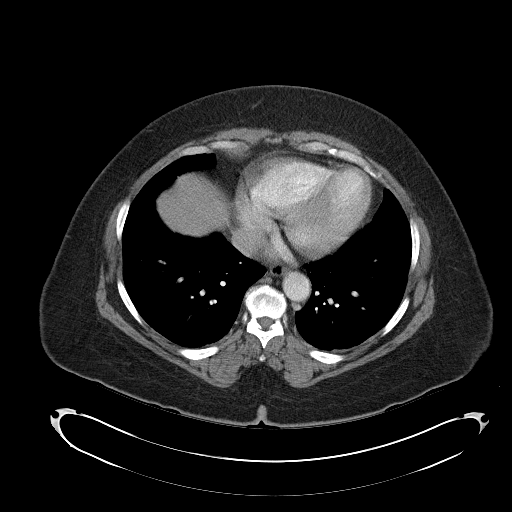
[im 88/94  soft-tissue]
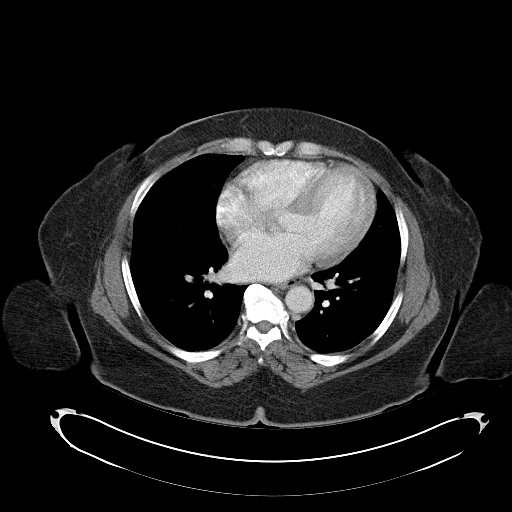

[Series 4: mpr cor post contrast 3.0mm · coronal · 0.86mm/px · 3 of 102 slices shown]
[im 34/102  soft-tissue]
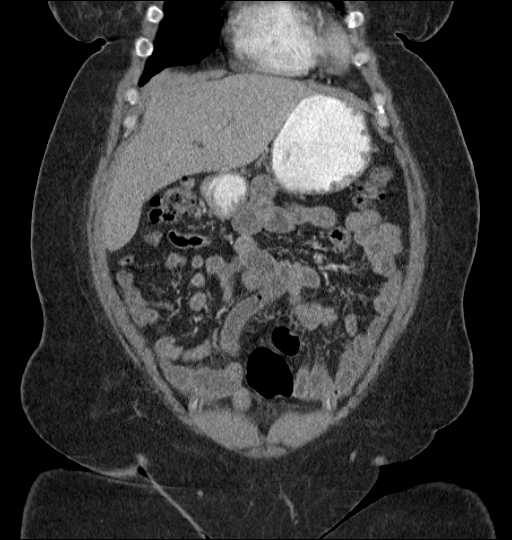
[im 45/102  soft-tissue]
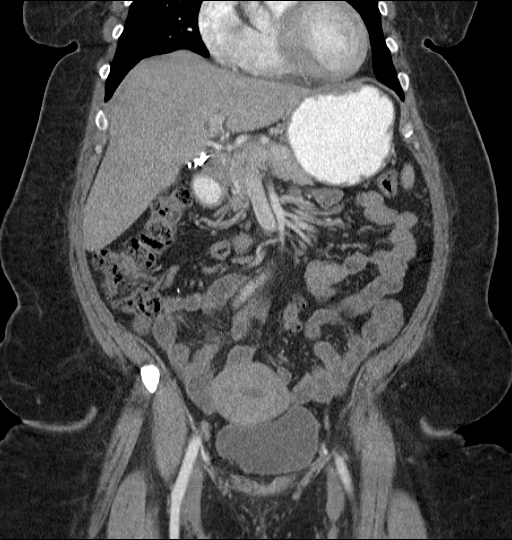
[im 57/102  soft-tissue]
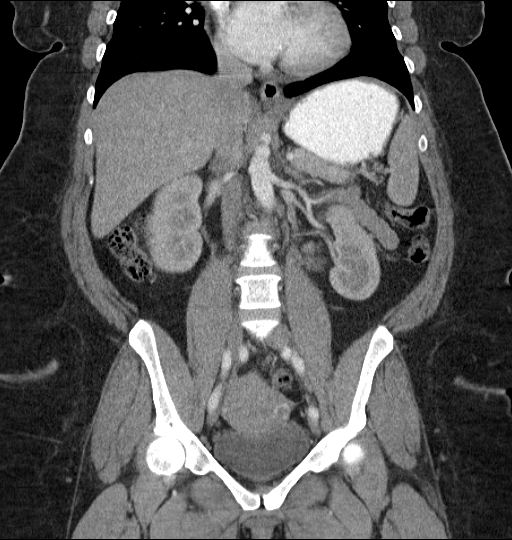

[17 of 46 positions shown; findings below may reference images not displayed]

FINDINGS: Lower chest:  No acute findings.

Hepatobiliary: There is mild fatty infiltration of the liver without
focal lesion. There is no bile duct dilatation. There is prior
cholecystectomy.

Pancreas: Normal

Spleen: Normal

Adrenals/Urinary Tract: The adrenals and kidneys are normal in
appearance. There is no hydronephrosis or ureteral dilatation. No
urinary calculi are evident.

Stomach/Bowel: The stomach and small bowel are slightly distended,
without caliber transition. The colon is remarkable only for mild
diverticulosis.

Vascular/Lymphatic: The abdominal aorta is normal in caliber. No
significant atherosclerotic disease is evident. There is no
adenopathy in the abdomen or pelvis.

Reproductive: There are uterine fibroids measuring up to 2.9 cm.
There are otherwise unremarkable appearances of the uterus and
adnexal structures.

Other: There is a fat containing umbilical hernia.

Musculoskeletal: Moderate degenerative changes are present of the
lower thoracic spine.
IMPRESSION: No significant acute findings are evident in the abdomen or pelvis.

Mild hepatic fatty infiltration.

Fat containing umbilical hernia.

Diverticulosis.

## 2014-11-07 MED ORDER — IOHEXOL 300 MG/ML  SOLN
25.0000 mL | Freq: Once | INTRAMUSCULAR | Status: AC | PRN
  Administered 2014-11-07: 25 mL via ORAL

## 2014-11-07 MED ORDER — MORPHINE SULFATE 4 MG/ML IJ SOLN
4.0000 mg | INTRAMUSCULAR | Status: DC | PRN
  Administered 2014-11-07: 4 mg via INTRAVENOUS
  Filled 2014-11-07: qty 1

## 2014-11-07 MED ORDER — SODIUM CHLORIDE 0.9 % IV BOLUS (SEPSIS)
500.0000 mL | Freq: Once | INTRAVENOUS | Status: AC
  Administered 2014-11-07: 500 mL via INTRAVENOUS

## 2014-11-07 MED ORDER — ONDANSETRON HCL 4 MG/2ML IJ SOLN
4.0000 mg | Freq: Once | INTRAMUSCULAR | Status: AC
  Administered 2014-11-07: 4 mg via INTRAVENOUS
  Filled 2014-11-07: qty 2

## 2014-11-07 MED ORDER — TRAMADOL HCL 50 MG PO TABS: 50.0000 mg | ORAL_TABLET | Freq: Four times a day (QID) | ORAL | Status: DC | PRN

## 2014-11-07 MED ORDER — SODIUM CHLORIDE 0.9 % IV SOLN: Freq: Once | INTRAVENOUS | Status: DC

## 2014-11-07 MED ORDER — IOHEXOL 300 MG/ML  SOLN
100.0000 mL | Freq: Once | INTRAMUSCULAR | Status: AC | PRN
  Administered 2014-11-07: 100 mL via INTRAVENOUS

## 2014-11-07 NOTE — ED Notes (Signed)
Pt seen here on Fri for gastritis, seen by PCP today. Pt reports abdominal pain and tenderness.

## 2014-11-07 NOTE — ED Notes (Signed)
Pt reporting continued improvement in abdominal pain.  States she does continue to have minor headache and some intermittent nausea.  No vomiting noted.

## 2014-11-07 NOTE — Discharge Instructions (Signed)
Abdominal Pain Many things can cause abdominal pain. Usually, abdominal pain is not caused by a disease and will improve without treatment. It can often be observed and treated at home. Your health care provider will do a physical exam and possibly order blood tests and X-rays to help determine the seriousness of your pain. However, in many cases, more time must pass before a clear cause of the pain can be found. Before that point, your health care provider may not know if you need more testing or further treatment. HOME CARE INSTRUCTIONS  Monitor your abdominal pain for any changes. The following actions may help to alleviate any discomfort you are experiencing:  Only take over-the-counter or prescription medicines as directed by your health care provider.  Do not take laxatives unless directed to do so by your health care provider.  Try a clear liquid diet (broth, tea, or water) as directed by your health care provider. Slowly move to a bland diet as tolerated. SEEK MEDICAL CARE IF:  You have unexplained abdominal pain.  You have abdominal pain associated with nausea or diarrhea.  You have pain when you urinate or have a bowel movement.  You experience abdominal pain that wakes you in the night.  You have abdominal pain that is worsened or improved by eating food.  You have abdominal pain that is worsened with eating fatty foods.  You have a fever. SEEK IMMEDIATE MEDICAL CARE IF:   Your pain does not go away within 2 hours.  You keep throwing up (vomiting).  Your pain is felt only in portions of the abdomen, such as the right side or the left lower portion of the abdomen.  You pass bloody or black tarry stools. MAKE SURE YOU:  Understand these instructions.   Will watch your condition.   Will get help right away if you are not doing well or get worse.  Document Released: 07/09/2005 Document Revised: 10/04/2013 Document Reviewed: 06/08/2013 [facility] Patient Information  2015 [place], [place]. This information is not intended to replace advice given to you by your health care provider. Make sure you discuss any questions you have with your health care provider.  Viral Gastroenteritis Viral gastroenteritis is also called stomach flu. This illness is caused by a certain type of germ (virus). It can cause sudden watery poop (diarrhea) and throwing up (vomiting). This can cause you to lose body fluids (dehydration). This illness usually lasts for 3 to 8 days. It usually goes away on its own. HOME CARE   Drink enough fluids to keep your pee (urine) clear or pale yellow. Drink small amounts of fluids often.  Ask your doctor how to replace body fluid losses (rehydration).  Avoid:  Foods high in sugar.  Alcohol.  Bubbly (carbonated) drinks.  Tobacco.  Juice.  Caffeine drinks.  Very hot or cold fluids.  Fatty, greasy foods.  Eating too much at one time.  Dairy products until 24 to 48 hours after your watery poop stops.  You may eat foods with active cultures (probiotics). They can be found in some yogurts and supplements.  Wash your hands well to avoid spreading the illness.  Only take medicines as told by your doctor. Do not give aspirin to children. Do not take medicines for watery poop (antidiarrheals).  Ask your doctor if you should keep taking your regular medicines.  Keep all doctor visits as told. GET HELP RIGHT AWAY IF:   You cannot keep fluids down.  You do not pee at least once  every 6 to 8 hours.  You are short of breath.  You see blood in your poop or throw up. This may look like coffee grounds.  You have belly (abdominal) pain that gets worse or is just in one small spot (localized).  You keep throwing up or having watery poop.  You have a fever.  The patient is a child younger than 3 months, and he or she has a fever.  The patient is a child older than 3 months, and he or she has a fever and problems that do not go  away.  The patient is a child older than 3 months, and he or she has a fever and problems that suddenly get worse.  The patient is a baby, and he or she has no tears when crying. MAKE SURE YOU:   Understand these instructions.  Will watch your condition.  Will get help right away if you are not doing well or get worse. Document Released: 03/17/2008 Document Revised: 12/22/2011 Document Reviewed: 07/16/2011 [facility] Patient Information 2015 [place], [place]. This information is not intended to replace advice given to you by your health care provider. Make sure you discuss any questions you have with your health care provider.

## 2014-11-07 NOTE — Progress Notes (Signed)
   Subjective:    Patient ID: [name], female    DOB: 02-Feb-1971, 44 y.o.   MRN: [id number]  HPI Pt in for Ed f/u c/o ongoing generalized abdominal pain. Had acute gastroenteritis at initial presentation, those symptoms have improved significantly, but her abdominal pain is worse, in her words, she has to be "guarding " her abdomen, and is bent forward in pain   Review of Systems See HPI Denies recent fever or chills. Denies sinus pressure, nasal congestion, ear pain or sore throat. Denies chest congestion, productive cough or wheezing. Denies chest pains, palpitations and leg swelling n.   Denies dysuria, frequency, hesitancy or incontinence. Denies joint pain, swelling and limitation in mobility. Denies headaches, seizures, numbness, or tingling. Denies depression, anxiety or insomnia. Denies skin break down or rash.        Objective:   Physical Exam BP 178/110 mmHg  Pulse 79  Resp 16  Ht 5\' 4"  (1.626 m)  Wt 263 lb (119.296 kg)  BMI 45.12 kg/m2  SpO2 98%  LMP 10/23/2014 Patient alert and oriented and in obvious pain  HEENT: No facial asymmetry, EOMI,   oropharynx pink and moist.  Neck supple no JVD, no mass.  Chest: Clear to auscultation bilaterally.  CVS: S1, S2 no murmurs, no S3.Regular rate.  ABD: Soft , obese, generalized superficial tenderness, wit rebound in LLQ  Ext: No edema  MS: Adequate ROM spine, shoulders, hips and knees.  Skin: Intact, no ulcerations or rash noted.  Psych: Good eye contact, normal affect. Memory intact not anxious or depressed appearing.  CNS: CN 2-12 intact, power,  normal throughout.no focal deficits noted.        Assessment & Plan:  Abdominal pain Ongoing severe abdominal pain, generalized, with guarding nad rebound, initially presented to Ed, due to symptom severity, needs re evaluation in ED   Malignant hypertension Uncontrolled, has had recent intestinal upset, re eval in 2 weeks DASH diet and commitment to  daily physical activity for a minimum of 30 minutes discussed and encouraged, as a part of hypertension management. The importance of attaining a healthy weight is also discussed.    Need for prophylactic vaccination and inoculation against influenza Vaccine administered at visit.

## 2014-11-07 NOTE — ED Notes (Signed)
Pt given fluids. Tolerating well.

## 2014-11-07 NOTE — ED Provider Notes (Signed)
CSN: [id number]     Arrival date & time 11/07/14  1204 History   First MD Initiated Contact with Patient 11/07/14 1455     Chief Complaint  Patient presents with  . Abdominal Pain      HPI  She presents evaluation of abdominal pain. Seen and evaluated here 1/22 with nausea vomiting diarrhea. Diagnosed treated for gastroenteritis. States he has "tenderness". States it still hurts to touch of her abdomen. Her other symptoms have improved although she is still taking Zofran. No fever. No restaurant complaints or cough or sputum production. No blood in her emesis. Heme-negative nonbilious. No blood pus or mucus in her stools. No dysuria figures hematuria. No joint pain skin rash or other symptoms.  Past Medical History  Diagnosis Date  . Obesity   . Hypertension   . Fibroid    Past Surgical History  Procedure Laterality Date  . Bilateral breast reduction  2004  . Appendectomy    . Cholecystectomy    . Removal of left ovarian cyst  2003  . Btl  2011  . Tubal ligation  2011    Women's   Family History  Problem Relation Age of Onset  . Coronary artery disease Mother   . Hypertension Mother   . Coronary artery disease Father   . Kidney disease Father   . Hypertension Father   . Hypertension Sister   . Anesthesia problems Neg Hx   . Hypotension Neg Hx   . Malignant hyperthermia Neg Hx   . Pseudochol deficiency Neg Hx    History  Substance Use Topics  . Smoking status: Never Smoker   . Smokeless tobacco: Never Used  . Alcohol Use: Yes     Comment: rarely   OB History    Gravida Para Term Preterm AB TAB SAB Ectopic Multiple Living   6 4  4 1  1   4      Review of Systems  Constitutional: Negative for fever, chills, diaphoresis, appetite change and fatigue.  HENT: Negative for mouth sores, sore throat and trouble swallowing.   Eyes: Negative for visual disturbance.  Respiratory: Negative for cough, chest tightness, shortness of breath and wheezing.   Cardiovascular:  Negative for chest pain.  Gastrointestinal: Positive for nausea, vomiting, abdominal pain and diarrhea. Negative for abdominal distention.  Endocrine: Negative for polydipsia, polyphagia and polyuria.  Genitourinary: Negative for dysuria, frequency and hematuria.  Musculoskeletal: Negative for gait problem.  Skin: Negative for color change, pallor and rash.  Neurological: Negative for dizziness, syncope, light-headedness and headaches.  Hematological: Does not bruise/bleed easily.  Psychiatric/Behavioral: Negative for behavioral problems and confusion.      Allergies  Review of patient's allergies indicates no known allergies.  Home Medications   Prior to Admission medications   Medication Sig Start Date End Date Taking? Authorizing Provider  acetaminophen (TYLENOL) 500 MG tablet Take 1,000 mg by mouth every 6 (six) hours as needed for mild pain or moderate pain.    Yes Historical Provider, MD  bismuth subsalicylate (PEPTO BISMOL) 262 MG chewable tablet Chew 524 mg by mouth as needed for indigestion or diarrhea or loose stools.   Yes Historical Provider, MD  cloNIDine (CATAPRES) 0.3 MG tablet TAKE ONE TABLET BY MOUTH THREE TIMES DAILY 11/06/14  Yes Fayrene Helper, MD  HYDROcodone-acetaminophen (NORCO/VICODIN) 5-325 MG per tablet Take 1 tablet by mouth every 4 (four) hours as needed. 11/03/14  Yes Lenox Ahr, PA-C  labetalol (NORMODYNE) 200 MG tablet Take 1 tablet (200  mg total) by mouth 3 (three) times daily. 03/14/14  Yes Fayrene Helper, MD  lisinopril (PRINIVIL,ZESTRIL) 10 MG tablet Take 10 mg by mouth at bedtime.  11/11/13  Yes Fayrene Helper, MD  ondansetron (ZOFRAN) 4 MG tablet Take 1 tablet (4 mg total) by mouth every 6 (six) hours. 11/03/14  Yes Lenox Ahr, PA-C  triamterene-hydrochlorothiazide (MAXZIDE) 75-50 MG per tablet TAKE ONE TABLET BY MOUTH ONCE DAILY 11/06/14  Yes Fayrene Helper, MD  traMADol (ULTRAM) 50 MG tablet Take 1 tablet (50 mg total) by mouth  every 6 (six) hours as needed. 11/07/14   Tanna Furry, MD   BP 198/136 mmHg  Pulse 81  Temp(Src) 98.3 F (36.8 C) (Oral)  Resp 18  Ht 5\' 3"  (1.6 m)  Wt 263 lb (119.296 kg)  BMI 46.60 kg/m2  SpO2 100%  LMP 10/23/2014 Physical Exam  Constitutional: She is oriented to person, place, and time. She appears well-developed and well-nourished. No distress.  Sitting in a chair at the bedside. Appears in no distress.  HENT:  Head: Normocephalic.  Eyes: Conjunctivae are normal. Pupils are equal, round, and reactive to light. No scleral icterus.  Neck: Normal range of motion. Neck supple. No thyromegaly present.  Cardiovascular: Normal rate and regular rhythm.  Exam reveals no gallop and no friction rub.   No murmur heard. Pulmonary/Chest: Effort normal and breath sounds normal. No respiratory distress. She has no wheezes. She has no rales.  Abdominal: Soft. Bowel sounds are normal. She exhibits no distension. There is no tenderness. There is no rebound.  Abdomen obese. Soft. Nondistended.  Active bowel sounds. Mild diffuse tenderness without peritoneal irritation.  Musculoskeletal: Normal range of motion.  Neurological: She is alert and oriented to person, place, and time.  Skin: Skin is warm and dry. No rash noted.  Psychiatric: She has a normal mood and affect. Her behavior is normal.    ED Course  Procedures (including critical care time) Labs Review Labs Reviewed  CBC WITH DIFFERENTIAL/PLATELET - Abnormal; Notable for the following:    Hemoglobin 11.4 (*)    All other components within normal limits  COMPREHENSIVE METABOLIC PANEL - Abnormal; Notable for the following:    Potassium 3.1 (*)    All other components within normal limits  URINALYSIS, ROUTINE W REFLEX MICROSCOPIC - Abnormal; Notable for the following:    APPearance HAZY (*)    Hgb urine dipstick TRACE (*)    All other components within normal limits  URINE MICROSCOPIC-ADD ON - Abnormal; Notable for the following:     Squamous Epithelial / LPF MANY (*)    Bacteria, UA FEW (*)    All other components within normal limits  LIPASE, BLOOD  PREGNANCY, URINE    Imaging Review Ct Abdomen Pelvis W Contrast  11/07/2014   CLINICAL DATA:  Abdominal pain and tenderness. Recently diagnosed with gastritis.  EXAM: CT ABDOMEN AND PELVIS WITH CONTRAST  TECHNIQUE: Multidetector CT imaging of the abdomen and pelvis was performed using the standard protocol following bolus administration of intravenous contrast.  CONTRAST:  46mL OMNIPAQUE IOHEXOL 300 MG/ML SOLN, 130mL OMNIPAQUE IOHEXOL 300 MG/ML SOLN  COMPARISON:  04/10/2005  FINDINGS: Lower chest:  No acute findings.  Hepatobiliary: There is mild fatty infiltration of the liver without focal lesion. There is no bile duct dilatation. There is prior cholecystectomy.  Pancreas: Normal  Spleen: Normal  Adrenals/Urinary Tract: The adrenals and kidneys are normal in appearance. There is no hydronephrosis or ureteral dilatation. No urinary calculi  are evident.  Stomach/Bowel: The stomach and small bowel are slightly distended, without caliber transition. The colon is remarkable only for mild diverticulosis.  Vascular/Lymphatic: The abdominal aorta is normal in caliber. No significant atherosclerotic disease is evident. There is no adenopathy in the abdomen or pelvis.  Reproductive: There are uterine fibroids measuring up to 2.9 cm. There are otherwise unremarkable appearances of the uterus and adnexal structures.  Other: There is a fat containing umbilical hernia.  Musculoskeletal: Moderate degenerative changes are present of the lower thoracic spine.  IMPRESSION: No significant acute findings are evident in the abdomen or pelvis.  Mild hepatic fatty infiltration.  Fat containing umbilical hernia.  Diverticulosis.   Electronically Signed   By: Andreas Newport M.D.   On: 11/07/2014 16:19     EKG Interpretation None      MDM   Final diagnoses:  AP (abdominal pain)  Gastroenteritis    She is status post cholecystectomy, and appendectomy. ETT scan obtained to rule out diverticulitis. Exam limited by girth.  CT scan and labs are reassuring. I think is appropriate for home treatment. Continue previous plan with antiemetics as needed. Advancing her diet. Limited number Ultram for pain.    Tanna Furry, MD 11/07/14 Karl Bales

## 2014-11-07 NOTE — Patient Instructions (Addendum)
F/u in 2. weeks, call if you need me before, important to keep the appt  You need to go to the Ed for valuation of your abdominal pain  Flu vaccine today

## 2014-11-08 ENCOUNTER — Telehealth: Payer: Self-pay | Admitting: Family Medicine

## 2014-11-08 MED ORDER — METRONIDAZOLE 500 MG PO TABS: 500.0000 mg | ORAL_TABLET | Freq: Two times a day (BID) | ORAL | Status: DC

## 2014-11-08 NOTE — Telephone Encounter (Signed)
Patient aware of std.  States that abdominal pain is somewhat better.  She will return to work on tomorrow.  Med sent to pharmacy.

## 2014-11-08 NOTE — Telephone Encounter (Signed)
noted 

## 2014-11-08 NOTE — Telephone Encounter (Signed)
pls call and check on pt re pain Explain urine showed possible trichomonas I have sen in the flagyll for 1 week

## 2014-11-12 DIAGNOSIS — R109 Unspecified abdominal pain: Secondary | ICD-10-CM | POA: Insufficient documentation

## 2014-11-12 DIAGNOSIS — Z23 Encounter for immunization: Secondary | ICD-10-CM | POA: Insufficient documentation

## 2014-11-12 NOTE — Assessment & Plan Note (Signed)
Ongoing severe abdominal pain, generalized, with guarding nad rebound, initially presented to Ed, due to symptom severity, needs re evaluation in ED

## 2014-11-12 NOTE — Assessment & Plan Note (Signed)
Uncontrolled, has had recent intestinal upset, re eval in 2 weeks DASH diet and commitment to daily physical activity for a minimum of 30 minutes discussed and encouraged, as a part of hypertension management. The importance of attaining a healthy weight is also discussed.

## 2014-11-12 NOTE — Assessment & Plan Note (Signed)
Vaccine administered at visit.  

## 2014-11-21 ENCOUNTER — Encounter: Payer: Self-pay | Admitting: Family Medicine

## 2014-11-21 ENCOUNTER — Ambulatory Visit (INDEPENDENT_AMBULATORY_CARE_PROVIDER_SITE_OTHER): Payer: BLUE CROSS/BLUE SHIELD | Admitting: Family Medicine

## 2014-11-21 VITALS — BP 160/108 | HR 90 | Resp 18 | Ht 64.0 in | Wt 262.0 lb

## 2014-11-21 DIAGNOSIS — I10 Essential (primary) hypertension: Secondary | ICD-10-CM

## 2014-11-21 DIAGNOSIS — R1030 Lower abdominal pain, unspecified: Secondary | ICD-10-CM

## 2014-11-21 MED ORDER — POTASSIUM CHLORIDE ER 10 MEQ PO TBCR: 10.0000 meq | EXTENDED_RELEASE_TABLET | Freq: Two times a day (BID) | ORAL | Status: DC

## 2014-11-21 MED ORDER — AMLODIPINE BESYLATE 10 MG PO TABS: 10.0000 mg | ORAL_TABLET | Freq: Every day | ORAL | Status: DC

## 2014-11-21 NOTE — Progress Notes (Signed)
   Subjective:    Patient ID: [name], female    DOB: [birth year], 44 y.o.   MRN: [id number]  HPI {Pt in for Ed follow up for abdominal pain and uncontrolled hypertension Pain is essentially gone, she has been treated for trichomonas She is tolerating her blood pressure med, however  Unfortunately is not on amlodipine , did not realize that she needed to take that as well Weight challenge is not being won despite the fact that she eats little, unfortunately , she drinks an excessive amount of calories, needs to change this and she understands this   Review of Systems See HPI     Objective:   Physical Exam BP 160/108 mmHg  Pulse 90  Resp 18  Ht 5\' 4"  (1.626 m)  Wt 262 lb 0.6 oz (118.861 kg)  BMI 44.96 kg/m2  SpO2 98%  LMP 10/23/2014 Patient alert and oriented and in no cardiopulmonary distress.  HEENT: No facial asymmetry, EOMI,   oropharynx pink and moist.  Neck supple no JVD, no mass.  Chest: Clear to auscultation bilaterally.  CVS: S1, S2 no murmurs, no S3.Regular rate.  ABD: Soft non tender.   Ext: No edema  MS: Adequate ROM spine, shoulders, hips and knees.  Skin: Intact, no ulcerations or rash noted.  Psych: Good eye contact, normal affect. Memory intact not anxious or depressed appearing.  CNS: CN 2-12 intact, power,  normal throughout.no focal deficits noted.        Assessment & Plan:  Malignant hypertension Improved but not at goal, pt inadvertently stopped  Th amlodipine, sh eis to resume same DASH diet and commitment to daily physical activity for a minimum of 30 minutes discussed and encouraged, as a part of hypertension management. The importance of attaining a healthy weight is also discussed.    Morbid obesity Unchanged, drinks over 1200 calories of soda  Daily. Patient re-educated about  the importance of commitment to a  minimum of 150 minutes of exercise per week. The importance of healthy food choices with portion control  discussed. Encouraged to start a food diary, count calories and to consider  joining a support group. Sample diet sheets offered. Goals set by the patient for the next several months.      Abdominal pain Almost entirely resolved. Was treated for trichomonas, which probabaly contributed to her pain

## 2014-11-21 NOTE — Patient Instructions (Signed)
F/u in 2 month, call if you need me before   Pls start additionally amlodipine 10 mg one daily, also potassium 10 meq two daily  Chem 7  In 2 month

## 2014-12-08 NOTE — Assessment & Plan Note (Signed)
Improved but not at goal, pt inadvertently stopped  Th amlodipine, sh eis to resume same DASH diet and commitment to daily physical activity for a minimum of 30 minutes discussed and encouraged, as a part of hypertension management. The importance of attaining a healthy weight is also discussed.

## 2014-12-08 NOTE — Assessment & Plan Note (Signed)
Almost entirely resolved. Was treated for trichomonas, which probabaly contributed to her pain

## 2014-12-08 NOTE — Assessment & Plan Note (Signed)
Unchanged, drinks over 1200 calories of soda  Daily. Patient re-educated about  the importance of commitment to a  minimum of 150 minutes of exercise per week. The importance of healthy food choices with portion control discussed. Encouraged to start a food diary, count calories and to consider  joining a support group. Sample diet sheets offered. Goals set by the patient for the next several months.

## 2015-01-17 ENCOUNTER — Ambulatory Visit: Payer: BLUE CROSS/BLUE SHIELD | Admitting: Family Medicine

## 2015-01-17 ENCOUNTER — Encounter: Payer: Self-pay | Admitting: Family Medicine

## 2015-03-22 ENCOUNTER — Telehealth: Payer: Self-pay

## 2015-03-22 DIAGNOSIS — I10 Essential (primary) hypertension: Secondary | ICD-10-CM

## 2015-03-22 NOTE — Telephone Encounter (Signed)
Appointment for 05/31/2015 @ 145.  Labs mailed to patient.

## 2015-04-25 ENCOUNTER — Other Ambulatory Visit: Payer: Self-pay | Admitting: Family Medicine

## 2015-05-31 ENCOUNTER — Ambulatory Visit (INDEPENDENT_AMBULATORY_CARE_PROVIDER_SITE_OTHER): Payer: BLUE CROSS/BLUE SHIELD | Admitting: Family Medicine

## 2015-05-31 ENCOUNTER — Encounter: Payer: Self-pay | Admitting: Family Medicine

## 2015-05-31 VITALS — BP 130/86 | HR 76 | Resp 18 | Ht 64.0 in | Wt 262.0 lb

## 2015-05-31 DIAGNOSIS — Z1231 Encounter for screening mammogram for malignant neoplasm of breast: Secondary | ICD-10-CM

## 2015-05-31 DIAGNOSIS — I10 Essential (primary) hypertension: Secondary | ICD-10-CM

## 2015-05-31 DIAGNOSIS — R760 Raised antibody titer: Secondary | ICD-10-CM

## 2015-05-31 DIAGNOSIS — M79641 Pain in right hand: Secondary | ICD-10-CM | POA: Diagnosis not present

## 2015-05-31 DIAGNOSIS — R7302 Impaired glucose tolerance (oral): Secondary | ICD-10-CM | POA: Diagnosis not present

## 2015-05-31 DIAGNOSIS — R768 Other specified abnormal immunological findings in serum: Secondary | ICD-10-CM

## 2015-05-31 DIAGNOSIS — E559 Vitamin D deficiency, unspecified: Secondary | ICD-10-CM

## 2015-05-31 NOTE — Patient Instructions (Addendum)
F/u in January, call if you need me before'   Flu vaccine next month  BP is good, no med changes at thsi time Work on weight loss, goal of 12 pounds in 5.5 month  You are referred for right carpal tunnel eval and mangement and also for mammogram   Chem 7 and EGFR today   Fasting lipid, cmp and EGFr, TSH, HBA1C , CBC 1 week before Jan appt,

## 2015-06-03 DIAGNOSIS — R7302 Impaired glucose tolerance (oral): Secondary | ICD-10-CM | POA: Insufficient documentation

## 2015-06-03 DIAGNOSIS — M79641 Pain in right hand: Secondary | ICD-10-CM | POA: Insufficient documentation

## 2015-06-03 NOTE — Assessment & Plan Note (Signed)
Patient educated about the importance of limiting  Carbohydrate intake , the need to commit to daily physical activity for a minimum of 30 minutes , and to commit weight loss. The fact that changes in all these areas will reduce or eliminate all together the development of diabetes is stressed.   Diabetic Labs Latest Ref Rng 11/07/2014 11/03/2014 01/03/2014 11/04/2013 10/27/2013  HbA1c <5.7 % - - - - 5.3  Chol 0 - 200 mg/dL - - - - 170  HDL >39 mg/dL - - - - 62  Calc LDL 0 - 99 mg/dL - - - - 87  Triglycerides <150 mg/dL - - - - 103  Creatinine 0.50 - 1.10 mg/dL 0.74 0.74 0.83 0.96 0.61   BP/Weight 05/31/2015 11/21/2014 11/07/2014 11/07/2014 11/03/2014 03/14/2014 5/99/3570  Systolic BP 177 939 030 092 330 076 226  Diastolic BP 86 333 545 625 110 110 80  Wt. (Lbs) 262 262.04 263 263 263 254 256.12  BMI 44.95 44.96 46.6 45.12 45.12 43.58 43.94   No flowsheet data found.

## 2015-06-03 NOTE — Assessment & Plan Note (Signed)
Disabling hand pain symptoms and signs consistent with carpal tunnel syndrome, referred to hand surgeon

## 2015-06-03 NOTE — Progress Notes (Signed)
[name]     MRN: [id number]      DOB: 03/28/1971   HPI [name] is here for follow up and re-evaluation of chronic medical conditions, medication management and review of any available recent lab and radiology data.  Preventive health is updated, specifically  Cancer screening and Immunization.   Questions or concerns regarding consultations or procedures which the PT has had in the interim are  addressed. The PT denies any adverse reactions to current medications since the last visit.  Progressive right hand pain with numbness and tingling, disturbs sleep , has to shake hand for relief   ROS Denies recent fever or chills. Denies sinus pressure, nasal congestion, ear pain or sore throat. Denies chest congestion, productive cough or wheezing. Denies chest pains, palpitations and leg swelling Denies abdominal pain, nausea, vomiting,diarrhea or constipation.   Denies dysuria, frequency, hesitancy or incontinence.  Denies headaches, seizures, numbness, or tingling. Denies depression, anxiety or insomnia. Denies skin break down or rash.   PE  BP 130/86 mmHg  Pulse 76  Resp 18  Ht 5\' 4"  (1.626 m)  Wt 262 lb (118.842 kg)  BMI 44.95 kg/m2  SpO2 98%  Patient alert and oriented and in no cardiopulmonary distress.  HEENT: No facial asymmetry, EOMI,   oropharynx pink and moist.  Neck supple no JVD, no mass.  Chest: Clear to auscultation bilaterally.  CVS: S1, S2 no murmurs, no S3.Regular rate.  ABD: Soft non tender.   Ext: No edema  MS: Adequate ROM spine, shoulders, hips and knees. Right hand: grade 4 power, positive tinnel's sign, thenar wasting Skin: Intact, no ulcerations or rash noted.  Psych: Good eye contact, normal affect. Memory intact not anxious or depressed appearing.  CNS: CN 2-12 intact, power,  normal throughout.no focal deficits noted.   Assessment & Plan   Malignant hypertension Controlled, no change in medication DASH diet and commitment to daily  physical activity for a minimum of 30 minutes discussed and encouraged, as a part of hypertension management. The importance of attaining a healthy weight is also discussed.  BP/Weight 05/31/2015 11/21/2014 11/07/2014 11/07/2014 11/03/2014 03/14/2014 1/82/9937  Systolic BP 169 678 938 101 751 025 852  Diastolic BP 86 778 242 353 110 110 80  Wt. (Lbs) 262 262.04 263 263 263 254 256.12  BMI 44.95 44.96 46.6 45.12 45.12 43.58 43.94        Morbid obesity Unchanged. Patient re-educated about  the importance of commitment to a  minimum of 150 minutes of exercise per week.  The importance of healthy food choices with portion control discussed. Encouraged to start a food diary, count calories and to consider  joining a support group. Sample diet sheets offered. Goals set by the patient for the next several months.   Weight /BMI 05/31/2015 11/21/2014 11/07/2014  WEIGHT 262 lb 262 lb 0.6 oz 263 lb  HEIGHT 5\' 4"  5\' 4"  5\' 3"   BMI 44.95 kg/m2 44.96 kg/m2 46.6 kg/m2    Current exercise per week 90 minutes.   Hand pain, right Disabling hand pain symptoms and signs consistent with carpal tunnel syndrome, referred to hand surgeon  IGT (impaired glucose tolerance) Patient educated about the importance of limiting  Carbohydrate intake , the need to commit to daily physical activity for a minimum of 30 minutes , and to commit weight loss. The fact that changes in all these areas will reduce or eliminate all together the development of diabetes is stressed.   Diabetic Labs Latest Ref  Rng 11/07/2014 11/03/2014 01/03/2014 11/04/2013 10/27/2013  HbA1c <5.7 % - - - - 5.3  Chol 0 - 200 mg/dL - - - - 170  HDL >39 mg/dL - - - - 62  Calc LDL 0 - 99 mg/dL - - - - 87  Triglycerides <150 mg/dL - - - - 103  Creatinine 0.50 - 1.10 mg/dL 0.74 0.74 0.83 0.96 0.61   BP/Weight 05/31/2015 11/21/2014 11/07/2014 11/07/2014 11/03/2014 03/14/2014 7/51/0258  Systolic BP 527 782 423 536 144 315 400  Diastolic BP 86 867 619 509 110 110 80   Wt. (Lbs) 262 262.04 263 263 263 254 256.12  BMI 44.95 44.96 46.6 45.12 45.12 43.58 43.94   No flowsheet data found.     Vitamin D deficiency Updated lab needed at/ before next visit.

## 2015-06-03 NOTE — Assessment & Plan Note (Signed)
Unchanged. Patient re-educated about  the importance of commitment to a  minimum of 150 minutes of exercise per week.  The importance of healthy food choices with portion control discussed. Encouraged to start a food diary, count calories and to consider  joining a support group. Sample diet sheets offered. Goals set by the patient for the next several months.   Weight /BMI 05/31/2015 11/21/2014 11/07/2014  WEIGHT 262 lb 262 lb 0.6 oz 263 lb  HEIGHT 5\' 4"  5\' 4"  5\' 3"   BMI 44.95 kg/m2 44.96 kg/m2 46.6 kg/m2    Current exercise per week 90 minutes.

## 2015-06-03 NOTE — Assessment & Plan Note (Signed)
Updated lab needed at/ before next visit.   

## 2015-06-03 NOTE — Assessment & Plan Note (Signed)
Controlled, no change in medication DASH diet and commitment to daily physical activity for a minimum of 30 minutes discussed and encouraged, as a part of hypertension management. The importance of attaining a healthy weight is also discussed.  BP/Weight 05/31/2015 11/21/2014 11/07/2014 11/07/2014 11/03/2014 03/14/2014 4/38/8875  Systolic BP 797 282 060 156 153 794 327  Diastolic BP 86 614 709 295 110 110 80  Wt. (Lbs) 262 262.04 263 263 263 254 256.12  BMI 44.95 44.96 46.6 45.12 45.12 43.58 43.94

## 2015-06-06 NOTE — Addendum Note (Signed)
Addended by: Denman George B on: 06/06/2015 10:21 AM   Modules accepted: Orders

## 2015-06-27 ENCOUNTER — Other Ambulatory Visit: Payer: Self-pay

## 2015-06-27 ENCOUNTER — Other Ambulatory Visit: Payer: Self-pay | Admitting: Family Medicine

## 2015-06-27 DIAGNOSIS — I10 Essential (primary) hypertension: Secondary | ICD-10-CM

## 2015-06-27 MED ORDER — LABETALOL HCL 200 MG PO TABS: 200.0000 mg | ORAL_TABLET | Freq: Three times a day (TID) | ORAL | Status: DC

## 2015-06-27 MED ORDER — LISINOPRIL 10 MG PO TABS: 10.0000 mg | ORAL_TABLET | Freq: Every day | ORAL | Status: DC

## 2015-06-27 MED ORDER — TRIAMTERENE-HCTZ 75-50 MG PO TABS: 1.0000 | ORAL_TABLET | Freq: Every day | ORAL | Status: DC

## 2015-06-27 MED ORDER — AMLODIPINE BESYLATE 10 MG PO TABS: 10.0000 mg | ORAL_TABLET | Freq: Every day | ORAL | Status: DC

## 2015-11-06 ENCOUNTER — Ambulatory Visit (INDEPENDENT_AMBULATORY_CARE_PROVIDER_SITE_OTHER): Payer: BLUE CROSS/BLUE SHIELD | Admitting: Family Medicine

## 2015-11-06 VITALS — BP 128/82 | HR 92 | Resp 18 | Ht 64.0 in | Wt 263.0 lb

## 2015-11-06 DIAGNOSIS — E559 Vitamin D deficiency, unspecified: Secondary | ICD-10-CM | POA: Diagnosis not present

## 2015-11-06 DIAGNOSIS — Y92009 Unspecified place in unspecified non-institutional (private) residence as the place of occurrence of the external cause: Secondary | ICD-10-CM

## 2015-11-06 DIAGNOSIS — Z23 Encounter for immunization: Secondary | ICD-10-CM

## 2015-11-06 DIAGNOSIS — W19XXXA Unspecified fall, initial encounter: Secondary | ICD-10-CM

## 2015-11-06 DIAGNOSIS — M722 Plantar fascial fibromatosis: Secondary | ICD-10-CM

## 2015-11-06 DIAGNOSIS — R531 Weakness: Secondary | ICD-10-CM

## 2015-11-06 DIAGNOSIS — R7302 Impaired glucose tolerance (oral): Secondary | ICD-10-CM

## 2015-11-06 DIAGNOSIS — Y92099 Unspecified place in other non-institutional residence as the place of occurrence of the external cause: Secondary | ICD-10-CM

## 2015-11-06 DIAGNOSIS — I10 Essential (primary) hypertension: Secondary | ICD-10-CM

## 2015-11-06 MED ORDER — PREDNISONE 5 MG (21) PO TBPK: ORAL_TABLET | ORAL | Status: DC

## 2015-11-06 MED ORDER — IBUPROFEN 800 MG PO TABS: 800.0000 mg | ORAL_TABLET | Freq: Three times a day (TID) | ORAL | Status: DC

## 2015-11-06 NOTE — Progress Notes (Signed)
Subjective:    Patient ID: [name], female    DOB: 1971-01-01, 45 y.o.   MRN: [id number]  HPI   [name]     MRN: [id number]      DOB: 1971/08/28   HPI [name] is here for follow up and re-evaluation of chronic medical conditions, medication management and review of any available recent lab and radiology data.  Preventive health is updated, specifically  Cancer screening and Immunization.    The PT denies any adverse reactions to current medications since the last visit.  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 No LOC, no associated  warning symptoms, no neurologic complaints Of note, pt's sibling has MS, when asked she  States that she is unaware of neurologic disease in her family  ROS Denies recent fever or chills. Denies sinus pressure, nasal congestion, ear pain or sore throat. Denies chest congestion, productive cough or wheezing. Denies chest pains, palpitations and leg swelling Denies abdominal pain, nausea, vomiting,diarrhea or constipation.   Denies dysuria, frequency, hesitancy or incontinence. Denies joint pain, swelling and limitation in mobility. Denies headaches, seizures, numbness, or tingling. Denies depression, anxiety or insomnia. Denies skin break down or rash.   PE  BP 128/82 mmHg  Pulse 92  Resp 18  Ht 5\' 4"  (1.626 m)  Wt 263 lb (119.296 kg)  BMI 45.12 kg/m2  SpO2 100%  Patient alert and oriented and in no cardiopulmonary distress.  HEENT: No facial asymmetry, EOMI,   oropharynx [name] and moist.  Neck supple no JVD, no mass.  Chest: Clear to auscultation bilaterally.  CVS: S1, S2 no murmurs, no S3.Regular rate.  ABD: Soft non tender.   Ext: No edema  MS: Adequate ROM spine, shoulders, hips and knees. Tender under both insteps and balls of the feet, point  tenderness also at base of 2nd and 3rd toe on right foot  Skin: Intact, no ulcerations or rash noted.  Psych: Good eye contact, normal affect. Memory intact not anxious or depressed appearing.  CNS: CN 2-12 intact, power,  normal throughout.no focal deficits noted.   Assessment & Plan   Malignant hypertension Controlled, no change in medication DASH diet and commitment to daily physical activity for a minimum of 30 minutes discussed and encouraged, as a part of hypertension management. The importance of attaining a healthy weight is also discussed.  BP/Weight 11/06/2015 05/31/2015 11/21/2014 11/07/2014 11/07/2014 [id number]  Systolic BP 0000000 [id number]  Diastolic BP 82 86 [id number] 110 110 110  Wt. (Lbs) 263 262 262.04 263 263 263 254  BMI 45.12 44.95 44.96 46.6 45.12 45.12 43.58        Morbid obesity Unchanged Patient re-educated about  the importance of commitment to a  minimum of 150 minutes of exercise per week.  The importance of healthy food choices with portion control discussed. Encouraged to start a food diary, count calories and to consider  joining a support group. Sample diet sheets offered. Goals set by the patient for the next several months.   Weight /BMI 11/06/2015 05/31/2015 11/21/2014  WEIGHT 263 lb 262 lb 262 lb 0.6 oz  HEIGHT 5\' 4"  5\' 4"  5\' 4"   BMI 45.12 kg/m2 44.95 kg/m2 44.96 kg/m2    Current exercise per week 60  minutes.   Plantar fasciitis, bilateral Acute onset , anti inflammatory course prescribed and pt educated re topical anti inflammatory, inserts and stretches , she will call back if no improvement for podiatry referral  Need for prophylactic vaccination and inoculation against influenza After obtaining informed consent, the vaccine is  administered by LPN.   Fall at home Single episode to date of fall due to acute lower extremity weakness, unknown cause. Denies joint pain, denies chest pain or light headedness. P has a  sibling with MS , unknown to her. The acute presentation of sudden weakness causing a fall needs neuro eval, her neurologic exam today is normal She has no h/o visual disturbance or ,memory impairment      Review of Systems     Objective:   Physical Exam        Assessment & Plan:

## 2015-11-06 NOTE — Patient Instructions (Signed)
Physical exam in 4 month, call if you need me sooner  Flu vaccine today  You are being treated for plantar fascitis , as the cause of foot pain. 1 week of medication is prescribed, rub soles on bottles of ice every evening, wear gel cushions in shoes and work on weight loss, stretching exercises for feet also recommended  I am referring you to dr Merlene Laughter to further evaluate you as far as the unexplained fall  Mammogram PAST due pls get this done!  Fasting labs 1 week before follow up  Thanks for choosing [facility], we consider it a privelige to serve you.  All the best!    Plantar Fasciitis Plantar fasciitis is a painful foot condition that affects the heel. It occurs when the band of tissue that connects the toes to the heel bone (plantar fascia) becomes irritated. This can happen after exercising too much or doing other repetitive activities (overuse injury). The pain from plantar fasciitis can range from mild irritation to severe pain that makes it difficult for you to walk or move. The pain is usually worse in the morning or after you have been sitting or lying down for a while. CAUSES This condition may be caused by:  Standing for long periods of time.  Wearing shoes that do not fit.  Doing high-impact activities, including running, aerobics, and ballet.  Being overweight.  Having an abnormal way of walking (gait).  Having tight calf muscles.  Having high arches in your feet.  Starting a new athletic activity. SYMPTOMS The main symptom of this condition is heel pain. Other symptoms include:  Pain that gets worse after activity or exercise.  Pain that is worse in the morning or after resting.  Pain that goes away after you walk for a few minutes. DIAGNOSIS This condition may be diagnosed based on your signs and symptoms. Your health care provider will also do a physical exam to check for:  A tender area on the bottom of your foot.  A high arch in  your foot.  Pain when you move your foot.  Difficulty moving your foot. You may also need to have imaging studies to confirm the diagnosis. These can include:  X-rays.  Ultrasound.  MRI. TREATMENT  Treatment for plantar fasciitis depends on the severity of the condition. Your treatment may include:  Rest, ice, and over-the-counter pain medicines to manage your pain.  Exercises to stretch your calves and your plantar fascia.  A splint that holds your foot in a stretched, upward position while you sleep (night splint).  Physical therapy to relieve symptoms and prevent problems in the future.  Cortisone injections to relieve severe pain.  Extracorporeal shock wave therapy (ESWT) to stimulate damaged plantar fascia with electrical impulses. It is often used as a last resort before surgery.  Surgery, if other treatments have not worked after 12 months. HOME CARE INSTRUCTIONS  Take medicines only as directed by your health care provider.  Avoid activities that cause pain.  Roll the bottom of your foot over a bag of ice or a bottle of cold water. Do this for 20 minutes, 3-4 times a day.  Perform simple stretches as directed by your health care provider.  Try wearing athletic shoes with air-sole or gel-sole cushions or soft shoe inserts.  Wear a night splint while sleeping, if directed by your health care provider.  Keep all follow-up appointments with your health care provider. PREVENTION   Do not perform exercises or activities that  cause heel pain.  Consider finding low-impact activities if you continue to have problems.  Lose weight if you need to. The best way to prevent plantar fasciitis is to avoid the activities that aggravate your plantar fascia. SEEK MEDICAL CARE IF:  Your symptoms do not go away after treatment with home care measures.  Your pain gets worse.  Your pain affects your ability to move or do your daily activities.   This information is not  intended to replace advice given to you by your health care provider. Make sure you discuss any questions you have with your health care provider.   Document Released: 06/24/2001 Document Revised: 06/20/2015 Document Reviewed: 08/09/2014 Elsevier Interactive Patient Education [employer].

## 2015-11-10 ENCOUNTER — Encounter: Payer: Self-pay | Admitting: Family Medicine

## 2015-11-10 DIAGNOSIS — Y92009 Unspecified place in unspecified non-institutional (private) residence as the place of occurrence of the external cause: Secondary | ICD-10-CM | POA: Insufficient documentation

## 2015-11-10 DIAGNOSIS — W19XXXA Unspecified fall, initial encounter: Secondary | ICD-10-CM | POA: Insufficient documentation

## 2015-11-10 DIAGNOSIS — M722 Plantar fascial fibromatosis: Secondary | ICD-10-CM | POA: Insufficient documentation

## 2015-11-10 DIAGNOSIS — Z23 Encounter for immunization: Secondary | ICD-10-CM | POA: Insufficient documentation

## 2015-11-10 NOTE — Assessment & Plan Note (Signed)
Single episode to date of fall due to acute lower extremity weakness, unknown cause. Denies joint pain, denies chest pain or light headedness. P has a sibling with MS , unknown to her. The acute presentation of sudden weakness causing a fall needs neuro eval, her neurologic exam today is normal She has no h/o visual disturbance or ,memory impairment

## 2015-11-10 NOTE — Assessment & Plan Note (Signed)
Acute onset , anti inflammatory course prescribed and pt educated re topical anti inflammatory, inserts and stretches , she will call back if no improvement for podiatry referral

## 2015-11-10 NOTE — Assessment & Plan Note (Signed)
After obtaining informed consent, the vaccine is  administered by LPN.  

## 2015-11-10 NOTE — Assessment & Plan Note (Signed)
Controlled, no change in medication DASH diet and commitment to daily physical activity for a minimum of 30 minutes discussed and encouraged, as a part of hypertension management. The importance of attaining a healthy weight is also discussed.  BP/Weight 11/06/2015 05/31/2015 11/21/2014 11/07/2014 11/07/2014 [id number]  Systolic BP 0000000 [id number]  Diastolic BP 82 86 [id number] 110 110 110  Wt. (Lbs) 263 262 262.04 263 263 263 254  BMI 45.12 44.95 44.96 46.6 45.12 45.12 43.58

## 2015-11-10 NOTE — Assessment & Plan Note (Signed)
Unchanged Patient re-educated about  the importance of commitment to a  minimum of 150 minutes of exercise per week.  The importance of healthy food choices with portion control discussed. Encouraged to start a food diary, count calories and to consider  joining a support group. Sample diet sheets offered. Goals set by the patient for the next several months.   Weight /BMI 11/06/2015 05/31/2015 11/21/2014  WEIGHT 263 lb 262 lb 262 lb 0.6 oz  HEIGHT 5\' 4"  5\' 4"  5\' 4"   BMI 45.12 kg/m2 44.95 kg/m2 44.96 kg/m2    Current exercise per week 60 minutes.

## 2016-02-03 ENCOUNTER — Other Ambulatory Visit: Payer: Self-pay | Admitting: Family Medicine

## 2016-02-22 ENCOUNTER — Other Ambulatory Visit: Payer: Self-pay

## 2016-02-22 ENCOUNTER — Encounter: Payer: Self-pay | Admitting: Family Medicine

## 2016-02-22 MED ORDER — CLONIDINE HCL 0.3 MG PO TABS: 0.3000 mg | ORAL_TABLET | Freq: Three times a day (TID) | ORAL | Status: DC

## 2016-03-05 ENCOUNTER — Encounter: Payer: BLUE CROSS/BLUE SHIELD | Admitting: Family Medicine

## 2016-03-05 ENCOUNTER — Encounter: Payer: Self-pay | Admitting: Family Medicine

## 2016-03-15 ENCOUNTER — Other Ambulatory Visit: Payer: Self-pay | Admitting: Family Medicine

## 2016-05-05 ENCOUNTER — Other Ambulatory Visit: Payer: Self-pay | Admitting: Family Medicine

## 2016-07-30 ENCOUNTER — Other Ambulatory Visit: Payer: Self-pay | Admitting: Family Medicine

## 2016-07-30 DIAGNOSIS — I10 Essential (primary) hypertension: Secondary | ICD-10-CM

## 2016-10-04 ENCOUNTER — Other Ambulatory Visit: Payer: Self-pay | Admitting: Family Medicine

## 2016-10-21 ENCOUNTER — Encounter: Payer: Self-pay | Admitting: Family Medicine

## 2016-10-22 ENCOUNTER — Other Ambulatory Visit: Payer: Self-pay

## 2016-10-22 MED ORDER — LABETALOL HCL 200 MG PO TABS: 200.0000 mg | ORAL_TABLET | Freq: Three times a day (TID) | ORAL | 3 refills | Status: DC

## 2016-11-03 ENCOUNTER — Ambulatory Visit: Payer: Self-pay | Admitting: Family Medicine

## 2017-02-26 ENCOUNTER — Other Ambulatory Visit: Payer: Self-pay | Admitting: Family Medicine

## 2017-02-26 DIAGNOSIS — I10 Essential (primary) hypertension: Secondary | ICD-10-CM

## 2017-04-17 ENCOUNTER — Other Ambulatory Visit: Payer: Self-pay | Admitting: Family Medicine

## 2017-11-05 ENCOUNTER — Other Ambulatory Visit: Payer: Self-pay | Admitting: Family Medicine

## 2017-11-05 ENCOUNTER — Encounter: Payer: Self-pay | Admitting: Family Medicine

## 2017-11-05 ENCOUNTER — Ambulatory Visit (INDEPENDENT_AMBULATORY_CARE_PROVIDER_SITE_OTHER): Payer: BLUE CROSS/BLUE SHIELD | Admitting: Family Medicine

## 2017-11-05 VITALS — BP 124/84 | HR 96 | Resp 16 | Ht 64.0 in | Wt 280.0 lb

## 2017-11-05 DIAGNOSIS — R7302 Impaired glucose tolerance (oral): Secondary | ICD-10-CM

## 2017-11-05 DIAGNOSIS — Z1231 Encounter for screening mammogram for malignant neoplasm of breast: Secondary | ICD-10-CM | POA: Diagnosis not present

## 2017-11-05 DIAGNOSIS — Z23 Encounter for immunization: Secondary | ICD-10-CM

## 2017-11-05 DIAGNOSIS — I10 Essential (primary) hypertension: Secondary | ICD-10-CM

## 2017-11-05 DIAGNOSIS — E559 Vitamin D deficiency, unspecified: Secondary | ICD-10-CM

## 2017-11-05 MED ORDER — CLONIDINE HCL 0.3 MG PO TABS: 0.3000 mg | ORAL_TABLET | Freq: Three times a day (TID) | ORAL | 3 refills | Status: DC

## 2017-11-05 MED ORDER — LISINOPRIL 10 MG PO TABS: 10.0000 mg | ORAL_TABLET | Freq: Every day | ORAL | 3 refills | Status: DC

## 2017-11-05 MED ORDER — LABETALOL HCL 200 MG PO TABS: 200.0000 mg | ORAL_TABLET | Freq: Three times a day (TID) | ORAL | 3 refills | Status: DC

## 2017-11-05 MED ORDER — POTASSIUM CHLORIDE ER 10 MEQ PO TBCR: 10.0000 meq | EXTENDED_RELEASE_TABLET | Freq: Two times a day (BID) | ORAL | 5 refills | Status: DC

## 2017-11-05 MED ORDER — AMLODIPINE BESYLATE 10 MG PO TABS: 10.0000 mg | ORAL_TABLET | Freq: Every day | ORAL | 3 refills | Status: DC

## 2017-11-05 MED ORDER — TRIAMTERENE-HCTZ 75-50 MG PO TABS: 1.0000 | ORAL_TABLET | Freq: Every day | ORAL | 3 refills | Status: DC

## 2017-11-05 NOTE — Assessment & Plan Note (Signed)
Controlled, no change in medication DASH diet and commitment to daily physical activity for a minimum of 30 minutes discussed and encouraged, as a part of hypertension management. The importance of attaining a healthy weight is also discussed.  BP/Weight 11/05/2017 11/06/2015 05/31/2015 11/21/2014 11/07/2014 11/07/2014 4/69/5072  Systolic BP 257 505 183 358 251 898 421  Diastolic BP 84 82 86 031 281 110 110  Wt. (Lbs) 280 263 262 262.04 263 263 263  BMI 48.06 45.12 44.95 44.96 46.6 45.12 45.12

## 2017-11-05 NOTE — Assessment & Plan Note (Signed)
After obtaining informed consent, the vaccine is  administered by LPN.  

## 2017-11-05 NOTE — Progress Notes (Signed)
[name]     MRN: [id number]      DOB: [birth year]   HPI Ms. Caban is here for follow up and re-evaluation of chronic medical conditions, medication management and review of any available recent lab and radiology data.  Preventive health is updated, specifically  Cancer screening and Immunization.    The PT denies any adverse reactions to current medications since the last visit.  C/o weight gain and is interested in gastric bypass    ROS Denies recent fever or chills. Denies sinus pressure, nasal congestion, ear pain or sore throat. Denies chest congestion, productive cough or wheezing. Denies chest pains, palpitations and leg swelling Denies abdominal pain, nausea, vomiting,diarrhea or constipation.   Denies dysuria, frequency, hesitancy or incontinence. Denies joint pain, swelling and limitation in mobility. Denies headaches, seizures, numbness, or tingling. Denies depression, anxiety or insomnia. Denies skin break down or rash.   PE  BP 124/84   Pulse 96   Resp 16   Ht 5\' 4"  (1.626 m)   Wt 280 lb (127 kg)   SpO2 96%   BMI 48.06 kg/m   Patient alert and oriented and in no cardiopulmonary distress.  HEENT: No facial asymmetry, EOMI,   oropharynx pink and moist.  Neck supple no JVD, no mass.  Chest: Clear to auscultation bilaterally.  CVS: S1, S2 no murmurs, no S3.Regular rate.  ABD: Soft non tender.   Ext: No edema  MS: Adequate ROM spine, shoulders, hips and knees.  Skin: Intact, no ulcerations or rash noted.  Psych: Good eye contact, normal affect. Memory intact not anxious or depressed appearing.  CNS: CN 2-12 intact, power,  normal throughout.no focal deficits noted.   Assessment & Plan  Malignant hypertension Controlled, no change in medication DASH diet and commitment to daily physical activity for a minimum of 30 minutes discussed and encouraged, as a part of hypertension management. The importance of attaining a healthy weight is also  discussed.  BP/Weight 11/05/2017 11/06/2015 05/31/2015 11/21/2014 11/07/2014 11/07/2014 1/49/7026  Systolic BP 378 588 502 774 128 786 767  Diastolic BP 84 82 86 209 470 110 110  Wt. (Lbs) 280 263 262 262.04 263 263 263  BMI 48.06 45.12 44.95 44.96 46.6 45.12 45.12       Morbid obesity Deteriorated. Patient re-educated about  the importance of commitment to a  minimum of 150 minutes of exercise per week.  The importance of healthy food choices with portion control discussed. Encouraged to start a food diary, count calories and to consider  joining a support group. Sample diet sheets offered. Goals set by the patient for the next several months.   Weight /BMI 11/05/2017 11/06/2015 05/31/2015  WEIGHT 280 lb 263 lb 262 lb  HEIGHT 5\' 4"  5\' 4"  5\' 4"   BMI 48.06 kg/m2 45.12 kg/m2 44.95 kg/m2      Vitamin D deficiency Updated lab needed at/ before next visit.   Need for prophylactic vaccination and inoculation against influenza After obtaining informed consent, the vaccine is  administered by LPN. '  IGT (impaired glucose tolerance) Patient educated about the importance of limiting  Carbohydrate intake , the need to commit to daily physical activity for a minimum of 30 minutes , and to commit weight loss. The fact that changes in all these areas will reduce or eliminate all together the development of diabetes is stressed.  Updated lab needed .   Diabetic Labs Latest Ref Rng & Units 11/05/2017 11/07/2014 11/03/2014 01/03/2014 11/04/2013  HbA1c <5.7 % - - - - -  Chol <200 mg/dL 161 - - - -  HDL >50 mg/dL 52 - - - -  Calc LDL 0 - 99 mg/dL - - - - -  Triglycerides <150 mg/dL 89 - - - -  Creatinine 0.50 - 1.10 mg/dL 0.76 0.74 0.74 0.83 0.96   BP/Weight 11/05/2017 11/06/2015 05/31/2015 11/21/2014 11/07/2014 11/07/2014 02/27/6159  Systolic BP 737 106 269 485 462 703 500  Diastolic BP 84 82 86 938 182 110 110  Wt. (Lbs) 280 263 262 262.04 263 263 263  BMI 48.06 45.12 44.95 44.96 46.6 45.12 45.12    No flowsheet data found.

## 2017-11-05 NOTE — Assessment & Plan Note (Signed)
Updated lab needed at/ before next visit.   

## 2017-11-05 NOTE — Assessment & Plan Note (Signed)
Deteriorated. Patient re-educated about  the importance of commitment to a  minimum of 150 minutes of exercise per week.  The importance of healthy food choices with portion control discussed. Encouraged to start a food diary, count calories and to consider  joining a support group. Sample diet sheets offered. Goals set by the patient for the next several months.   Weight /BMI 11/05/2017 11/06/2015 05/31/2015  WEIGHT 280 lb 263 lb 262 lb  HEIGHT 5\' 4"  5\' 4"  5\' 4"   BMI 48.06 kg/m2 45.12 kg/m2 44.95 kg/m2

## 2017-11-05 NOTE — Assessment & Plan Note (Signed)
Patient educated about the importance of limiting  Carbohydrate intake , the need to commit to daily physical activity for a minimum of 30 minutes , and to commit weight loss. The fact that changes in all these areas will reduce or eliminate all together the development of diabetes is stressed.  Updated lab needed .   Diabetic Labs Latest Ref Rng & Units 11/05/2017 11/07/2014 11/03/2014 01/03/2014 11/04/2013  HbA1c <5.7 % - - - - -  Chol <200 mg/dL 161 - - - -  HDL >50 mg/dL 52 - - - -  Calc LDL 0 - 99 mg/dL - - - - -  Triglycerides <150 mg/dL 89 - - - -  Creatinine 0.50 - 1.10 mg/dL 0.76 0.74 0.74 0.83 0.96   BP/Weight 11/05/2017 11/06/2015 05/31/2015 11/21/2014 11/07/2014 11/07/2014 0/48/8891  Systolic BP 694 503 888 280 034 917 915  Diastolic BP 84 82 86 056 979 110 110  Wt. (Lbs) 280 263 262 262.04 263 263 263  BMI 48.06 45.12 44.95 44.96 46.6 45.12 45.12   No flowsheet data found.

## 2017-11-05 NOTE — Patient Instructions (Signed)
Physical and pap las week in March, call if you need me sooner  Please schedule mammogram at checkout, morning preferred  Flu vaccine today  90 day supplies of BP meds to be sent in for next 12 months   Labs today CBC, HBA1c, lipid, cmp and EGFR, Tsh and Vit D  Excellent BP  Info on gastric bypass provided  Food diary, change food choice and start 30 min exercise every evening at home  Thank you  for choosing [facility]. We consider it a privelige to serve you.  Delivering excellent health care in a caring and  compassionate way is our goal.  Partnering with you,  so that together we can achieve this goal is our strategy.

## 2017-11-06 ENCOUNTER — Other Ambulatory Visit: Payer: Self-pay | Admitting: Family Medicine

## 2017-11-06 ENCOUNTER — Encounter: Payer: Self-pay | Admitting: Family Medicine

## 2017-11-06 LAB — LIPID PANEL
Cholesterol: 161 mg/dL (ref <200)
HDL: 52 mg/dL (ref >50)
LDL Cholesterol (Calc): 91 mg/dL (calc)
Non-HDL Cholesterol (Calc): 109 mg/dL (calc) (ref <130)
Total CHOL/HDL Ratio: 3.1 (calc) (ref <5.0)
Triglycerides: 89 mg/dL (ref <150)

## 2017-11-06 LAB — CBC
HCT: 35.4 % (ref 35.0–45.0)
Hemoglobin: 11.9 g/dL (ref 11.7–15.5)
MCH: 29.8 pg (ref 27.0–33.0)
MCHC: 33.6 g/dL (ref 32.0–36.0)
MCV: 88.5 fL (ref 80.0–100.0)
MPV: 11.8 fL (ref 7.5–12.5)
Platelets: 234 10*3/uL (ref 140–400)
RBC: 4 10*6/uL (ref 3.80–5.10)
RDW: 12.4 % (ref 11.0–15.0)
WBC: 6.4 10*3/uL (ref 3.8–10.8)

## 2017-11-06 LAB — COMPLETE METABOLIC PANEL WITH GFR
AG Ratio: 1.4 (calc) (ref 1.0–2.5)
ALT: 22 U/L (ref 6–29)
AST: 21 U/L (ref 10–35)
Albumin: 3.7 g/dL (ref 3.6–5.1)
Alkaline phosphatase (APISO): 107 U/L (ref 33–115)
BUN: 12 mg/dL (ref 7–25)
CO2: 26 mmol/L (ref 20–32)
Calcium: 8.7 mg/dL (ref 8.6–10.2)
Chloride: 108 mmol/L (ref 98–110)
Creat: 0.76 mg/dL (ref 0.50–1.10)
GFR, Est African American: 110 mL/min/{1.73_m2} (ref >=60)
GFR, Est Non African American: 95 mL/min/{1.73_m2} (ref >=60)
Globulin: 2.7 g/dL (calc) (ref 1.9–3.7)
Glucose, Bld: 88 mg/dL (ref 65–99)
Potassium: 4 mmol/L (ref 3.5–5.3)
Sodium: 140 mmol/L (ref 135–146)
Total Bilirubin: 1 mg/dL (ref 0.2–1.2)
Total Protein: 6.4 g/dL (ref 6.1–8.1)

## 2017-11-06 LAB — HEMOGLOBIN A1C
Hgb A1c MFr Bld: 5 % of total Hgb (ref <5.7)
Mean Plasma Glucose: 97 (calc)
eAG (mmol/L): 5.4 (calc)

## 2017-11-06 LAB — VITAMIN D 25 HYDROXY (VIT D DEFICIENCY, FRACTURES): Vit D, 25-Hydroxy: 7 ng/mL — ABNORMAL LOW (ref 30–100)

## 2017-11-06 LAB — TSH: TSH: 1.89 mIU/L

## 2017-11-06 MED ORDER — ERGOCALCIFEROL 1.25 MG (50000 UT) PO CAPS: 50000.0000 [IU] | ORAL_CAPSULE | ORAL | 3 refills | Status: DC

## 2017-11-09 ENCOUNTER — Encounter (HOSPITAL_COMMUNITY): Payer: Self-pay

## 2017-11-09 ENCOUNTER — Ambulatory Visit (HOSPITAL_COMMUNITY)
Admission: RE | Admit: 2017-11-09 | Discharge: 2017-11-09 | Disposition: A | Discharge status: Home / Self Care | Payer: BLUE CROSS/BLUE SHIELD | Source: Ambulatory Visit | Attending: Family Medicine | Admitting: Family Medicine

## 2017-11-09 DIAGNOSIS — Z1231 Encounter for screening mammogram for malignant neoplasm of breast: Secondary | ICD-10-CM | POA: Diagnosis not present

## 2018-01-05 ENCOUNTER — Encounter: Payer: Self-pay | Admitting: Family Medicine

## 2018-01-05 ENCOUNTER — Other Ambulatory Visit: Payer: Self-pay

## 2018-01-05 ENCOUNTER — Other Ambulatory Visit (HOSPITAL_COMMUNITY)
Admission: RE | Admit: 2018-01-05 | Discharge: 2018-01-05 | Disposition: A | Discharge status: Home / Self Care | Payer: BLUE CROSS/BLUE SHIELD | Source: Ambulatory Visit | Attending: Family Medicine | Admitting: Family Medicine

## 2018-01-05 ENCOUNTER — Ambulatory Visit (INDEPENDENT_AMBULATORY_CARE_PROVIDER_SITE_OTHER): Payer: BLUE CROSS/BLUE SHIELD | Admitting: Family Medicine

## 2018-01-05 VITALS — BP 116/80 | HR 91 | Ht 63.0 in | Wt 274.1 lb

## 2018-01-05 DIAGNOSIS — N760 Acute vaginitis: Secondary | ICD-10-CM | POA: Diagnosis not present

## 2018-01-05 DIAGNOSIS — Z0000 Encounter for general adult medical examination without abnormal findings: Secondary | ICD-10-CM | POA: Diagnosis not present

## 2018-01-05 DIAGNOSIS — B369 Superficial mycosis, unspecified: Secondary | ICD-10-CM | POA: Diagnosis not present

## 2018-01-05 DIAGNOSIS — Z124 Encounter for screening for malignant neoplasm of cervix: Secondary | ICD-10-CM | POA: Diagnosis not present

## 2018-01-05 MED ORDER — CLOTRIMAZOLE-BETAMETHASONE 1-0.05 % EX CREA: TOPICAL_CREAM | CUTANEOUS | 0 refills | Status: DC

## 2018-01-05 NOTE — Patient Instructions (Signed)
F/u in 5.5 months, call if you need me sooner   I am VERY EXCITED that you are making GREAT strides in a POSITVE direction as far as your health is concerned, please keep this up. I believe that you will since it feels so good!   You have lost 6 pounds since your last visit  Bariatric surgery will help to propel you toward improved health , so I DO encourage you to pursue this.  Medication Is sent for rash on the back of your neck  If you decide to have your carpal tunnel syndrome addressed call for the referral, you may you use braces at night to reduce the pain you feel when asleep

## 2018-01-08 ENCOUNTER — Other Ambulatory Visit: Payer: Self-pay | Admitting: Family Medicine

## 2018-01-08 ENCOUNTER — Encounter: Payer: Self-pay | Admitting: Family Medicine

## 2018-01-08 DIAGNOSIS — N760 Acute vaginitis: Secondary | ICD-10-CM | POA: Insufficient documentation

## 2018-01-08 LAB — CYTOLOGY - PAP
Bacterial vaginitis: POSITIVE — AB
Candida vaginitis: NEGATIVE
Chlamydia: NEGATIVE
Diagnosis: NEGATIVE
HPV: NOT DETECTED
Neisseria Gonorrhea: NEGATIVE
Trichomonas: NEGATIVE

## 2018-01-08 MED ORDER — METRONIDAZOLE 500 MG PO TABS: 500.0000 mg | ORAL_TABLET | Freq: Two times a day (BID) | ORAL | 0 refills | Status: DC

## 2018-01-08 NOTE — Assessment & Plan Note (Signed)
Symptomatic  With abnormal exam , will treat when report is available

## 2018-01-08 NOTE — Assessment & Plan Note (Signed)
Fungal rash on posterior neck, clotrimazole/betamethasone cream prescribed for twice daily use

## 2018-01-08 NOTE — Assessment & Plan Note (Addendum)
Improved, she is applaided on this Patient re-educated about  the importance of commitment to a  minimum of 150 minutes of exercise per week.  The importance of healthy food choices with portion control discussed. Encouraged to start a food diary, count calories and to consider  joining a support group. She is provided with information re bariatric surgery in which she is very interested. F/u in 5.5 months with 15 lb weight loss target   Weight /BMI 01/05/2018 11/05/2017 11/06/2015  WEIGHT 274 lb 1.9 oz 280 lb 263 lb  HEIGHT 5\' 3"  5\' 4"  5\' 4"   BMI 48.56 kg/m2 48.06 kg/m2 45.12 kg/m2

## 2018-01-08 NOTE — Assessment & Plan Note (Addendum)
Annual exam as documented. Counseling done  re healthy lifestyle involving commitment to 150 minutes exercise per week, heart healthy diet, and attaining healthy weight.The importance of adequate sleep also discussed. Regular seat belt use and home safety, is also discussed. Changes in health habits are decided on by the patient with goals and time frames  set for achieving them. Icancer screening needs are specifically addressed at this visit.

## 2018-01-08 NOTE — Progress Notes (Signed)
[name]     MRN: [id number]      DOB: [birth year]  HPI: Patient is in for annual physical exam. C/o vaginal discharge, wants STD testing Requests information on bariatric surgery Recent labs, if available are reviewed. Immunization is reviewed , and  updated if needed.   PE: BP 116/80   Pulse 91   Ht 5\' 3"  (1.6 m)   Wt 274 lb 1.9 oz (124.3 kg)   LMP 01/02/2018   SpO2 98%   BMI 48.56 kg/m   Pleasant  female, alert and oriented x 3, in no cardio-pulmonary distress. Afebrile. HEENT No facial trauma or asymetry. Sinuses non tender.  Extra occullar muscles intact, pupils equally reactive to light. External ears normal, tympanic membranes clear. Oropharynx moist, no exudate. Neck: supple, no adenopathy,JVD or thyromegaly.No bruits.  Chest: Clear to ascultation bilaterally.No crackles or wheezes. Non tender to palpation  Breast: No asymetry,no masses or lumps. No tenderness. No nipple discharge or inversion. No axillary or supraclavicular adenopathy  Cardiovascular system; Heart sounds normal,  S1 and  S2 ,no S3.  No murmur, or thrill. Apical beat not displaced Peripheral pulses normal.  Abdomen: Soft, non tender, no organomegaly or masses. No bruits. Bowel sounds normal. No guarding, tenderness or rebound.  Rectal:  Pt to return 3 stool cards  GU: External genitalia normal female genitalia , normal female distribution of hair. No lesions. Urethral meatus normal in size, no  Prolapse, no lesions visibly  Present. Bladder non tender. Vagina pink and moist , with no visible lesions ,fishy  discharge present .bloody , currently on menses Adequate pelvic support no  cystocele or rectocele noted Cervix pink and appears healthy, no lesions or ulcerations noted, fishy discharge noted from os Uterus normal size, no adnexal masses, no cervical motion or adnexal tenderness.   Musculoskeletal exam: Full ROM of spine, hips , shoulders and knees. No deformity  ,swelling or crepitus noted. No muscle wasting or atrophy.   Neurologic: Cranial nerves 2 to 12 intact. Power, tone ,sensation and reflexes normal throughout. No disturbance in gait. No tremor.  Skin: Intact, hyperpigmented macular rash on 50% of posterior neck   Psych; Normal mood and affect. Judgement and concentration normal   Assessment & Plan:  Annual physical exam Annual exam as documented. Counseling done  re healthy lifestyle involving commitment to 150 minutes exercise per week, heart healthy diet, and attaining healthy weight.The importance of adequate sleep also discussed. Regular seat belt use and home safety, is also discussed. Changes in health habits are decided on by the patient with goals and time frames  set for achieving them. Icancer screening needs are specifically addressed at this visit.   Dermatomycosis Fungal rash on posterior neck, clotrimazole/betamethasone cream prescribed for twice daily use  Morbid obesity Improved, she is applaided on this Patient re-educated about  the importance of commitment to a  minimum of 150 minutes of exercise per week.  The importance of healthy food choices with portion control discussed. Encouraged to start a food diary, count calories and to consider  joining a support group. She is provided with information re bariatric surgery in which she is very interested. F/u in 5.5 months with 15 lb weight loss target   Weight /BMI 01/05/2018 11/05/2017 11/06/2015  WEIGHT 274 lb 1.9 oz 280 lb 263 lb  HEIGHT 5\' 3"  5\' 4"  5\' 4"   BMI 48.56 kg/m2 48.06 kg/m2 45.12 kg/m2      Vaginitis and vulvovaginitis Symptomatic  With abnormal exam ,  will treat when report is available

## 2018-05-07 DIAGNOSIS — R609 Edema, unspecified: Secondary | ICD-10-CM | POA: Diagnosis not present

## 2018-05-07 DIAGNOSIS — S20219A Contusion of unspecified front wall of thorax, initial encounter: Secondary | ICD-10-CM | POA: Diagnosis not present

## 2018-05-07 DIAGNOSIS — I10 Essential (primary) hypertension: Secondary | ICD-10-CM | POA: Diagnosis not present

## 2018-05-07 DIAGNOSIS — S82891A Other fracture of right lower leg, initial encounter for closed fracture: Secondary | ICD-10-CM | POA: Diagnosis not present

## 2018-05-07 DIAGNOSIS — M79644 Pain in right finger(s): Secondary | ICD-10-CM | POA: Diagnosis not present

## 2018-05-07 DIAGNOSIS — R0789 Other chest pain: Secondary | ICD-10-CM | POA: Diagnosis not present

## 2018-05-07 DIAGNOSIS — S8002XA Contusion of left knee, initial encounter: Secondary | ICD-10-CM | POA: Diagnosis not present

## 2018-05-07 DIAGNOSIS — Z79899 Other long term (current) drug therapy: Secondary | ICD-10-CM | POA: Diagnosis not present

## 2018-05-07 DIAGNOSIS — R079 Chest pain, unspecified: Secondary | ICD-10-CM | POA: Diagnosis not present

## 2018-05-10 ENCOUNTER — Encounter: Payer: Self-pay | Admitting: Family Medicine

## 2018-05-10 ENCOUNTER — Telehealth: Payer: Self-pay | Admitting: Orthopedic Surgery

## 2018-05-10 NOTE — Telephone Encounter (Signed)
Call from patient following being seen at [facility] Emergency room for injury to ankle. Relayed we have no providers today, and Dr Aline Brochure is out of clinic for 2 weeks; Dr Luna Glasgow is here limited hours this week. States feels needs to be seen today; therefore, will contact orthopaedist referred to in that area.

## 2018-05-11 ENCOUNTER — Ambulatory Visit (INDEPENDENT_AMBULATORY_CARE_PROVIDER_SITE_OTHER): Payer: Self-pay | Admitting: Orthopedic Surgery

## 2018-05-12 ENCOUNTER — Ambulatory Visit (INDEPENDENT_AMBULATORY_CARE_PROVIDER_SITE_OTHER): Payer: Self-pay

## 2018-05-12 ENCOUNTER — Ambulatory Visit (INDEPENDENT_AMBULATORY_CARE_PROVIDER_SITE_OTHER): Payer: BLUE CROSS/BLUE SHIELD | Admitting: Orthopaedic Surgery

## 2018-05-12 DIAGNOSIS — Z6841 Body Mass Index (BMI) 40.0 and over, adult: Secondary | ICD-10-CM | POA: Insufficient documentation

## 2018-05-12 DIAGNOSIS — M25571 Pain in right ankle and joints of right foot: Secondary | ICD-10-CM | POA: Diagnosis not present

## 2018-05-12 MED ORDER — HYDROCODONE-ACETAMINOPHEN 5-325 MG PO TABS: ORAL_TABLET | ORAL | 0 refills | Status: DC
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
## 2018-05-18 ENCOUNTER — Encounter (INDEPENDENT_AMBULATORY_CARE_PROVIDER_SITE_OTHER): Payer: Self-pay

## 2018-05-18 ENCOUNTER — Telehealth (INDEPENDENT_AMBULATORY_CARE_PROVIDER_SITE_OTHER): Payer: Self-pay | Admitting: Orthopaedic Surgery

## 2018-05-18 NOTE — Telephone Encounter (Signed)
Patient called job is requesting a detailed work note. Would like the note to include how many hours a day can patient be on feet? How many pounds can patient lift,or can she lift?How many hours a day can patient work? Patient would like work note emailed if possible    [email or web address]

## 2018-05-18 NOTE — Telephone Encounter (Signed)
Please advise. Let me know I can do note.

## 2018-05-18 NOTE — Telephone Encounter (Signed)
Desk work only.  She can be on her feet off and on for no more than a few hours per day while at work.  No lifting more than 5 pounds.  This is until fu appt

## 2018-05-18 NOTE — Telephone Encounter (Signed)
Could not email for some reason rejected sending email to her email. Called her advised her note is ready for pick up at the front desk.

## 2018-05-25 ENCOUNTER — Telehealth (INDEPENDENT_AMBULATORY_CARE_PROVIDER_SITE_OTHER): Payer: Self-pay | Admitting: Orthopaedic Surgery

## 2018-05-25 NOTE — Telephone Encounter (Signed)
Sorry needs a RTW DATE

## 2018-05-25 NOTE — Telephone Encounter (Signed)
What does she need it so say?

## 2018-05-25 NOTE — Telephone Encounter (Signed)
Letter is still unacceptable from HR.

## 2018-05-25 NOTE — Telephone Encounter (Signed)
Patient called stated note still unacceptable from HR.  Please call patient as she can discuss exactly what needs to be put in note.  Several details are still missing and she needs to talk to someone for another letter.  Call patient [phone]

## 2018-05-25 NOTE — Telephone Encounter (Signed)
05/26/18

## 2018-05-25 NOTE — Telephone Encounter (Signed)
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  Do not want her on her feet more than 2 consecutive hours per day (needs to be able to elevate).  No lifting more than 5 pounds.

## 2018-05-25 NOTE — Telephone Encounter (Signed)
Needs new work needs to include the following. Please advise  Desk Duty -Needs RTW date? How many hours a day she can work? How many hours a day she can stand ? How many pounds can she lift?  All this has to be ALL in one letter.    FAX: [phone]   Need letter faxed to fax number above.

## 2018-05-27 ENCOUNTER — Encounter (INDEPENDENT_AMBULATORY_CARE_PROVIDER_SITE_OTHER): Payer: Self-pay

## 2018-05-27 NOTE — Telephone Encounter (Signed)
Per patients request faxed letter to her.

## 2018-05-27 NOTE — Telephone Encounter (Signed)
Patient calling to see when letter can be picked up. 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  Please call patient as soon as she can pick up letter.  [phone]

## 2018-06-02 ENCOUNTER — Ambulatory Visit (INDEPENDENT_AMBULATORY_CARE_PROVIDER_SITE_OTHER): Payer: BLUE CROSS/BLUE SHIELD | Admitting: Physician Assistant

## 2018-06-02 ENCOUNTER — Ambulatory Visit (INDEPENDENT_AMBULATORY_CARE_PROVIDER_SITE_OTHER): Payer: Self-pay

## 2018-06-02 ENCOUNTER — Encounter (INDEPENDENT_AMBULATORY_CARE_PROVIDER_SITE_OTHER): Payer: Self-pay | Admitting: Orthopaedic Surgery

## 2018-06-02 VITALS — Ht 63.0 in | Wt 273.0 lb

## 2018-06-02 DIAGNOSIS — M25571 Pain in right ankle and joints of right foot: Secondary | ICD-10-CM | POA: Diagnosis not present

## 2018-06-02 DIAGNOSIS — Z6841 Body Mass Index (BMI) 40.0 and over, adult: Secondary | ICD-10-CM

## 2018-06-02 NOTE — Progress Notes (Signed)
Post-Op Visit Note   Patient: [name]           Date of Birth: May 06, 1971           MRN: [id number] Visit Date: 06/02/2018 PCP: Fayrene Helper, MD   Assessment & Plan:  Chief Complaint:  Chief Complaint  Patient presents with  . Right Ankle - Fracture, Follow-up   Visit Diagnoses:  1. Pain in right ankle and joints of right foot   2. Morbid obesity ([place])   3. Body mass index 45.0-49.9, adult [facility])     Plan: Patient is a pleasant 47 year old female presents to our clinic today for follow-up of her right ankle avulsion fracture lateral malleolus, date of injury 05/07/2018.  She has been compliant weightbearing as tolerated in her cam walker.  Elevating for swelling.  She does note moderate calf tenderness right lower extremity, but no history of DVT.  She has not previously been on oral contraceptives and is a non-smoker.  No family history.  Examination of the right lower extremity reveals moderate tenderness over the lateral malleolus.  She does have moderate calf tenderness with a positive Homans.  She is neurovascularly intact distally.  At this point, her fracture is healing and we will transition her into an ASO weightbearing as tolerated.  We will also start her in formal physical therapy where a prescription was given to the patient today.  In addition, we will we will obtain a venous Doppler ultrasound to rule out DVT.  She will follow-up with [place] in 3 weeks time for repeat evaluation.  In the meantime, we will continue her current work restrictions for another 4 weeks.  Call with concerns or questions in the meantime  The patient meets the AMA guidelines for Morbid (severe) obesity with a BMI > 40.0 and I have recommended weight loss.   Follow-Up Instructions: Return in about 3 weeks (around 06/23/2018).   Orders:  Orders Placed This Encounter  Procedures  . XR Ankle Complete Right   No orders of the defined types were placed in this encounter.   Imaging: Xr  Ankle Complete Right  Result Date: 06/02/2018 Stable alignment of the small avulsion fracture off of the lateral malleolus   PMFS History: Patient Active Problem List   Diagnosis Date Noted  . Pain in right ankle and joints of right foot 05/12/2018  . Body mass index 45.0-49.9, adult ([place]) 05/12/2018  . Vaginitis and vulvovaginitis 01/08/2018  . Dermatomycosis 01/05/2018  . Plantar fasciitis, bilateral 11/10/2015  . IGT (impaired glucose tolerance) 06/03/2015  . Annual physical exam 03/19/2014  . Helicobacter pylori ab+ 10/29/2013  . GERD (gastroesophageal reflux disease) 10/27/2013  . History of snoring 10/27/2013  . Vitamin D deficiency 03/19/2012  . Morbid obesity ([place]) 01/20/2008  . Malignant hypertension 01/20/2008   Past Medical History:  Diagnosis Date  . Fibroid   . Hypertension   . Obesity     Family History  Problem Relation Age of Onset  . Coronary artery disease Mother   . Hypertension Mother   . Coronary artery disease Father   . Kidney disease Father   . Hypertension Father   . Hypertension Sister   . Anesthesia problems Neg Hx   . Hypotension Neg Hx   . Malignant hyperthermia Neg Hx   . Pseudochol deficiency Neg Hx     Past Surgical History:  Procedure Laterality Date  . APPENDECTOMY    . bilateral breast reduction  2004  . bTL  2011  . CHOLECYSTECTOMY    . REDUCTION MAMMAPLASTY Bilateral   . removal of left ovarian cyst  2003  . TUBAL LIGATION  2011   Women's   Social History   Occupational History  . Occupation: umemployed, layed off from [employer]  Tobacco Use  . Smoking status: Never Smoker  . Smokeless tobacco: Never Used  Substance and Sexual Activity  . Alcohol use: Yes    Comment: rarely  . Drug use: No  . Sexual activity: Yes    Birth control/protection: Abstinence, Surgical

## 2018-06-02 NOTE — Addendum Note (Signed)
Addended by: Meyer Cory on: 06/02/2018 02:26 PM   Modules accepted: Orders

## 2018-06-03 ENCOUNTER — Ambulatory Visit (HOSPITAL_COMMUNITY): Payer: BLUE CROSS/BLUE SHIELD

## 2018-06-04 ENCOUNTER — Telehealth (INDEPENDENT_AMBULATORY_CARE_PROVIDER_SITE_OTHER): Payer: Self-pay | Admitting: Physician Assistant

## 2018-06-04 NOTE — Telephone Encounter (Signed)
Insur/demo/last ov note faxed to [facility] [phone]

## 2018-06-08 DIAGNOSIS — M6281 Muscle weakness (generalized): Secondary | ICD-10-CM | POA: Diagnosis not present

## 2018-06-08 DIAGNOSIS — M25471 Effusion, right ankle: Secondary | ICD-10-CM | POA: Diagnosis not present

## 2018-06-08 DIAGNOSIS — M25671 Stiffness of right ankle, not elsewhere classified: Secondary | ICD-10-CM | POA: Diagnosis not present

## 2018-06-08 DIAGNOSIS — M25571 Pain in right ankle and joints of right foot: Secondary | ICD-10-CM | POA: Diagnosis not present

## 2018-06-10 DIAGNOSIS — M25671 Stiffness of right ankle, not elsewhere classified: Secondary | ICD-10-CM | POA: Diagnosis not present

## 2018-06-10 DIAGNOSIS — M25571 Pain in right ankle and joints of right foot: Secondary | ICD-10-CM | POA: Diagnosis not present

## 2018-06-10 DIAGNOSIS — M6281 Muscle weakness (generalized): Secondary | ICD-10-CM | POA: Diagnosis not present

## 2018-06-10 DIAGNOSIS — M25471 Effusion, right ankle: Secondary | ICD-10-CM | POA: Diagnosis not present

## 2018-06-15 DIAGNOSIS — M25571 Pain in right ankle and joints of right foot: Secondary | ICD-10-CM | POA: Diagnosis not present

## 2018-06-15 DIAGNOSIS — M25671 Stiffness of right ankle, not elsewhere classified: Secondary | ICD-10-CM | POA: Diagnosis not present

## 2018-06-15 DIAGNOSIS — M6281 Muscle weakness (generalized): Secondary | ICD-10-CM | POA: Diagnosis not present

## 2018-06-15 DIAGNOSIS — M25471 Effusion, right ankle: Secondary | ICD-10-CM | POA: Diagnosis not present

## 2018-06-16 DIAGNOSIS — M25671 Stiffness of right ankle, not elsewhere classified: Secondary | ICD-10-CM | POA: Diagnosis not present

## 2018-06-16 DIAGNOSIS — M25471 Effusion, right ankle: Secondary | ICD-10-CM | POA: Diagnosis not present

## 2018-06-16 DIAGNOSIS — M6281 Muscle weakness (generalized): Secondary | ICD-10-CM | POA: Diagnosis not present

## 2018-06-16 DIAGNOSIS — M25571 Pain in right ankle and joints of right foot: Secondary | ICD-10-CM | POA: Diagnosis not present

## 2018-06-17 DIAGNOSIS — M25471 Effusion, right ankle: Secondary | ICD-10-CM | POA: Diagnosis not present

## 2018-06-17 DIAGNOSIS — M6281 Muscle weakness (generalized): Secondary | ICD-10-CM | POA: Diagnosis not present

## 2018-06-17 DIAGNOSIS — M25571 Pain in right ankle and joints of right foot: Secondary | ICD-10-CM | POA: Diagnosis not present

## 2018-06-17 DIAGNOSIS — M25671 Stiffness of right ankle, not elsewhere classified: Secondary | ICD-10-CM | POA: Diagnosis not present

## 2018-06-22 DIAGNOSIS — M25571 Pain in right ankle and joints of right foot: Secondary | ICD-10-CM | POA: Diagnosis not present

## 2018-06-22 DIAGNOSIS — M6281 Muscle weakness (generalized): Secondary | ICD-10-CM | POA: Diagnosis not present

## 2018-06-22 DIAGNOSIS — M25671 Stiffness of right ankle, not elsewhere classified: Secondary | ICD-10-CM | POA: Diagnosis not present

## 2018-06-22 DIAGNOSIS — M25471 Effusion, right ankle: Secondary | ICD-10-CM | POA: Diagnosis not present

## 2018-06-23 DIAGNOSIS — M25571 Pain in right ankle and joints of right foot: Secondary | ICD-10-CM | POA: Diagnosis not present

## 2018-06-23 DIAGNOSIS — M25671 Stiffness of right ankle, not elsewhere classified: Secondary | ICD-10-CM | POA: Diagnosis not present

## 2018-06-23 DIAGNOSIS — M6281 Muscle weakness (generalized): Secondary | ICD-10-CM | POA: Diagnosis not present

## 2018-06-23 DIAGNOSIS — M25471 Effusion, right ankle: Secondary | ICD-10-CM | POA: Diagnosis not present

## 2018-06-24 ENCOUNTER — Encounter (INDEPENDENT_AMBULATORY_CARE_PROVIDER_SITE_OTHER): Payer: Self-pay

## 2018-06-24 ENCOUNTER — Ambulatory Visit (INDEPENDENT_AMBULATORY_CARE_PROVIDER_SITE_OTHER): Payer: BLUE CROSS/BLUE SHIELD | Admitting: Orthopaedic Surgery

## 2018-06-24 ENCOUNTER — Encounter (INDEPENDENT_AMBULATORY_CARE_PROVIDER_SITE_OTHER): Payer: Self-pay | Admitting: Orthopaedic Surgery

## 2018-06-24 DIAGNOSIS — M25471 Effusion, right ankle: Secondary | ICD-10-CM | POA: Diagnosis not present

## 2018-06-24 DIAGNOSIS — S82899D Other fracture of unspecified lower leg, subsequent encounter for closed fracture with routine healing: Secondary | ICD-10-CM | POA: Insufficient documentation

## 2018-06-24 DIAGNOSIS — S82891D Other fracture of right lower leg, subsequent encounter for closed fracture with routine healing: Secondary | ICD-10-CM | POA: Diagnosis not present

## 2018-06-24 DIAGNOSIS — M25571 Pain in right ankle and joints of right foot: Secondary | ICD-10-CM | POA: Diagnosis not present

## 2018-06-24 DIAGNOSIS — M25671 Stiffness of right ankle, not elsewhere classified: Secondary | ICD-10-CM | POA: Diagnosis not present

## 2018-06-24 DIAGNOSIS — M6281 Muscle weakness (generalized): Secondary | ICD-10-CM | POA: Diagnosis not present

## 2018-06-24 DIAGNOSIS — Z6841 Body Mass Index (BMI) 40.0 and over, adult: Secondary | ICD-10-CM | POA: Diagnosis not present

## 2018-06-24 NOTE — Progress Notes (Signed)
Office Visit Note   Patient: [name]           Date of Birth: 12-11-1970           MRN: [id number] Visit Date: 06/24/2018              Requested by: Fayrene Helper, [address], [place], [place] [address] PCP: Fayrene Helper, MD   Assessment & Plan: Visit Diagnoses:  1. Closed avulsion fracture of right ankle with routine healing, subsequent encounter   2. Morbid obesity ([place])   3. Body mass index 45.0-49.9, adult ([facility])     Plan: Impression is 6 weeks status post right lateral malleolus avulsion fracture.  Patient is doing much better at this point.  She can start weightbearing as tolerated without an ASO.  She will wear the ASO on uneven grounds.  She will continue with physical therapy to work on range of motion gait stabilization.  We will continue her current work note for another 4 weeks.  She will follow-up with [place] in 4 weeks time for final recheck.  Call with concerns or questions in the meantime.  Follow-Up Instructions: Return in about 4 weeks (around 07/22/2018).   Orders:  No orders of the defined types were placed in this encounter.  No orders of the defined types were placed in this encounter.     Procedures: No procedures performed   Clinical Data: No additional findings.   Subjective: Chief Complaint  Patient presents with  . Right Ankle - Pain    HPI patient is a pleasant 47 year old female who presents to our clinic today 6 weeks out from a small right ankle avulsion fracture off the lateral malleolus less.,  Date of injury 05/07/2018.  She has been weightbearing as tolerated and ASO brace.  Doing well.  She still has some pain with walking and at the end of the day, although this has improved.  She has been taking Tylenol as needed.  She has been in physical therapy where she is regaining range of motion.  Review of Systems as detailed in HPI.  All others reviewed and are negative.  Objective: Vital Signs: There were no  vitals taken for this visit.  Physical Exam well-developed well-nourished female in no acute distress.  Alert and oriented x3.  Ortho Exam examination of the right ankle reveals mild swelling.  Minimal tenderness to the fracture site.  Calf is soft and nontender.  She can dorsiflex to neutral position.  She is neurovascular intact distally.  Specialty Comments:  No specialty comments available.  Imaging: No new imaging today   PMFS History: Patient Active Problem List   Diagnosis Date Noted  . Closed avulsion fracture of ankle with routine healing 06/24/2018  . Pain in right ankle and joints of right foot 05/12/2018  . Body mass index 45.0-49.9, adult ([place]) 05/12/2018  . Vaginitis and vulvovaginitis 01/08/2018  . Dermatomycosis 01/05/2018  . Plantar fasciitis, bilateral 11/10/2015  . IGT (impaired glucose tolerance) 06/03/2015  . Annual physical exam 03/19/2014  . Helicobacter pylori ab+ 10/29/2013  . GERD (gastroesophageal reflux disease) 10/27/2013  . History of snoring 10/27/2013  . Vitamin D deficiency 03/19/2012  . Morbid obesity ([place]) 01/20/2008  . Malignant hypertension 01/20/2008   Past Medical History:  Diagnosis Date  . Fibroid   . Hypertension   . Obesity     Family History  Problem Relation Age of Onset  . Coronary artery disease Mother   .  Hypertension Mother   . Coronary artery disease Father   . Kidney disease Father   . Hypertension Father   . Hypertension Sister   . Anesthesia problems Neg Hx   . Hypotension Neg Hx   . Malignant hyperthermia Neg Hx   . Pseudochol deficiency Neg Hx     Past Surgical History:  Procedure Laterality Date  . APPENDECTOMY    . bilateral breast reduction  2004  . bTL  2011  . CHOLECYSTECTOMY    . REDUCTION MAMMAPLASTY Bilateral   . removal of left ovarian cyst  2003  . TUBAL LIGATION  2011   Women's   Social History   Occupational History  . Occupation: umemployed, layed off from health industry[employer]  Tobacco Use   . Smoking status: Never Smoker  . Smokeless tobacco: Never Used  Substance and Sexual Activity  . Alcohol use: Yes    Comment: rarely  . Drug use: No  . Sexual activity: Yes    Birth control/protection: Abstinence, Surgical

## 2018-06-29 DIAGNOSIS — M25671 Stiffness of right ankle, not elsewhere classified: Secondary | ICD-10-CM | POA: Diagnosis not present

## 2018-06-29 DIAGNOSIS — M25571 Pain in right ankle and joints of right foot: Secondary | ICD-10-CM | POA: Diagnosis not present

## 2018-06-29 DIAGNOSIS — M6281 Muscle weakness (generalized): Secondary | ICD-10-CM | POA: Diagnosis not present

## 2018-06-29 DIAGNOSIS — M25471 Effusion, right ankle: Secondary | ICD-10-CM | POA: Diagnosis not present

## 2018-06-30 DIAGNOSIS — M6281 Muscle weakness (generalized): Secondary | ICD-10-CM | POA: Diagnosis not present

## 2018-06-30 DIAGNOSIS — M25671 Stiffness of right ankle, not elsewhere classified: Secondary | ICD-10-CM | POA: Diagnosis not present

## 2018-06-30 DIAGNOSIS — M25471 Effusion, right ankle: Secondary | ICD-10-CM | POA: Diagnosis not present

## 2018-06-30 DIAGNOSIS — M25571 Pain in right ankle and joints of right foot: Secondary | ICD-10-CM | POA: Diagnosis not present

## 2018-07-07 DIAGNOSIS — M6281 Muscle weakness (generalized): Secondary | ICD-10-CM | POA: Diagnosis not present

## 2018-07-07 DIAGNOSIS — M25571 Pain in right ankle and joints of right foot: Secondary | ICD-10-CM | POA: Diagnosis not present

## 2018-07-07 DIAGNOSIS — M25671 Stiffness of right ankle, not elsewhere classified: Secondary | ICD-10-CM | POA: Diagnosis not present

## 2018-07-07 DIAGNOSIS — M25471 Effusion, right ankle: Secondary | ICD-10-CM | POA: Diagnosis not present

## 2018-07-13 DIAGNOSIS — M25571 Pain in right ankle and joints of right foot: Secondary | ICD-10-CM | POA: Diagnosis not present

## 2018-07-13 DIAGNOSIS — M25671 Stiffness of right ankle, not elsewhere classified: Secondary | ICD-10-CM | POA: Diagnosis not present

## 2018-07-13 DIAGNOSIS — M6281 Muscle weakness (generalized): Secondary | ICD-10-CM | POA: Diagnosis not present

## 2018-07-13 DIAGNOSIS — M25471 Effusion, right ankle: Secondary | ICD-10-CM | POA: Diagnosis not present

## 2018-07-17 ENCOUNTER — Encounter (INDEPENDENT_AMBULATORY_CARE_PROVIDER_SITE_OTHER): Payer: Self-pay | Admitting: Orthopaedic Surgery

## 2018-07-22 ENCOUNTER — Ambulatory Visit (INDEPENDENT_AMBULATORY_CARE_PROVIDER_SITE_OTHER): Payer: BLUE CROSS/BLUE SHIELD | Admitting: Orthopaedic Surgery

## 2018-07-22 ENCOUNTER — Encounter (INDEPENDENT_AMBULATORY_CARE_PROVIDER_SITE_OTHER): Payer: Self-pay | Admitting: Orthopaedic Surgery

## 2018-07-22 ENCOUNTER — Ambulatory Visit (INDEPENDENT_AMBULATORY_CARE_PROVIDER_SITE_OTHER): Payer: BLUE CROSS/BLUE SHIELD

## 2018-07-22 DIAGNOSIS — Z6841 Body Mass Index (BMI) 40.0 and over, adult: Secondary | ICD-10-CM | POA: Diagnosis not present

## 2018-07-22 DIAGNOSIS — S82891D Other fracture of right lower leg, subsequent encounter for closed fracture with routine healing: Secondary | ICD-10-CM | POA: Diagnosis not present

## 2018-07-22 NOTE — Progress Notes (Signed)
Patient: [name]           Date of Birth: [birth year]           MRN: [id number] Visit Date: 07/22/2018 PCP: Fayrene Helper, MD   Assessment & Plan:  Chief Complaint:  Chief Complaint  Patient presents with  . Right Ankle - Follow-up   Visit Diagnoses:  1. Closed avulsion fracture of right ankle with routine healing, subsequent encounter   2. Morbid obesity ([place])   3. Body mass index 45.0-49.9, adult [facility])     Plan: Patient is a pleasant 47 year old female who presents to our clinic today approximately 10 weeks out right ankle avulsion fracture to the fibula.  She has been doing fairly well over the past few weeks.  She is in physical therapy where she is regaining vision and strength.  She is ambulating without an ASO without any issues.  Her main issue remains swelling to the right lower extremity.  She has been wearing compression socks and elevating as much as possible.  No calf pain.  Examination of her right lower extremity reveals a soft calf which is nontender.  Negative Homans.  She is neurovascular intact distally.  She does have mild tenderness to the distal fibula.  Full range of motion of the ankle.  At this point, I would like for the patient to continue wearing her compression socks.  She can wean from formal therapy to home exercise program when ready.  We will provide her with a work note to return to full duty this coming Monday.  Follow-up with [place] as needed. The patient meets the AMA guidelines for Morbid (severe) obesity with a BMI > 40.0 and I have recommended weight loss.    Follow-Up Instructions: Return if symptoms worsen or fail to improve.   Orders:  Orders Placed This Encounter  Procedures  . XR Ankle Complete Right   No orders of the defined types were placed in this encounter.   Imaging: Xr Ankle Complete Right  Result Date: 07/22/2018 X-rays reveal a healed avulsion fracture distal fibula   PMFS History: Patient Active Problem  List   Diagnosis Date Noted  . Closed avulsion fracture of ankle with routine healing 06/24/2018  . Pain in right ankle and joints of right foot 05/12/2018  . Body mass index 45.0-49.9, adult ([place]) 05/12/2018  . Vaginitis and vulvovaginitis 01/08/2018  . Dermatomycosis 01/05/2018  . Plantar fasciitis, bilateral 11/10/2015  . IGT (impaired glucose tolerance) 06/03/2015  . Annual physical exam 03/19/2014  . Helicobacter pylori ab+ 10/29/2013  . GERD (gastroesophageal reflux disease) 10/27/2013  . History of snoring 10/27/2013  . Vitamin D deficiency 03/19/2012  . Morbid obesity ([place]) 01/20/2008  . Malignant hypertension 01/20/2008   Past Medical History:  Diagnosis Date  . Fibroid   . Hypertension   . Obesity     Family History  Problem Relation Age of Onset  . Coronary artery disease Mother   . Hypertension Mother   . Coronary artery disease Father   . Kidney disease Father   . Hypertension Father   . Hypertension Sister   . Anesthesia problems Neg Hx   . Hypotension Neg Hx   . Malignant hyperthermia Neg Hx   . Pseudochol deficiency Neg Hx     Past Surgical History:  Procedure Laterality Date  . APPENDECTOMY    . bilateral breast reduction  2004  . bTL  2011  . CHOLECYSTECTOMY    . REDUCTION  MAMMAPLASTY Bilateral   . removal of left ovarian cyst  2003  . TUBAL LIGATION  2011   Women's   Social History   Occupational History  . Occupation: umemployed, layed off from health industry[employer]  Tobacco Use  . Smoking status: Never Smoker  . Smokeless tobacco: Never Used  Substance and Sexual Activity  . Alcohol use: Yes    Comment: rarely  . Drug use: No  . Sexual activity: Yes    Birth control/protection: Abstinence, Surgical

## 2018-07-27 ENCOUNTER — Encounter: Payer: Self-pay | Admitting: Family Medicine

## 2018-07-27 ENCOUNTER — Ambulatory Visit (INDEPENDENT_AMBULATORY_CARE_PROVIDER_SITE_OTHER): Payer: BLUE CROSS/BLUE SHIELD | Admitting: Family Medicine

## 2018-07-27 VITALS — BP 122/70 | HR 94 | Resp 12 | Ht 64.0 in | Wt 271.0 lb

## 2018-07-27 DIAGNOSIS — I10 Essential (primary) hypertension: Secondary | ICD-10-CM

## 2018-07-27 DIAGNOSIS — Z1231 Encounter for screening mammogram for malignant neoplasm of breast: Secondary | ICD-10-CM

## 2018-07-27 DIAGNOSIS — E559 Vitamin D deficiency, unspecified: Secondary | ICD-10-CM

## 2018-07-27 DIAGNOSIS — Z23 Encounter for immunization: Secondary | ICD-10-CM

## 2018-07-27 NOTE — Assessment & Plan Note (Signed)
Controlled, no change in medication  DASH diet and commitment to daily physical activity for a minimum of 30 minutes discussed and encouraged, as a part of hypertension management. The importance of attaining a healthy weight is also discussed.  BP/Weight 07/27/2018 06/02/2018 01/05/2018 11/05/2017 11/06/2015 01/11/2507 04/12/9940  Systolic BP 290 - 475 339 179 217 837  Diastolic BP 70 - 80 84 82 86 108  Wt. (Lbs) 271.04 273 274.12 280 263 262 262.04  BMI 46.52 48.36 48.56 48.06 45.12 44.95 44.96

## 2018-07-27 NOTE — Patient Instructions (Addendum)
Annual physical exam March 27 or after , call if you need me before  Mammogram due January 29 or after  To be scheduled at checkout   Flu vaccine today  Fasting  cm and EGFr and Vit D level today  It is important that you exercise regularly at least 30 minutes 5 times a week. If you develop chest pain, have severe difficulty breathing, or feel very tired, stop exercising immediately and seek medical attention     Thanks for choosing [facility], we consider it a privelige to serve you. You will get info on bariatric surgery     

## 2018-08-29 ENCOUNTER — Encounter: Payer: Self-pay | Admitting: Family Medicine

## 2018-08-29 NOTE — Progress Notes (Signed)
   [name]     MRN: [id number]      DOB: [birth year]   HPI [name] is here for follow up and re-evaluation of chronic medical conditions, medication management and review of any available recent lab and radiology data.  Preventive health is updated, specifically  Cancer screening and Immunization.   Ankle fracture healing well The PT denies any adverse reactions to current medications since the last visit.  There are no new concerns.  There are no specific complaints   ROS Denies recent fever or chills. Denies sinus pressure, nasal congestion, ear pain or sore throat. Denies chest congestion, productive cough or wheezing. Denies chest pains, palpitations and leg swelling Denies abdominal pain, nausea, vomiting,diarrhea or constipation.   Denies dysuria, frequency, hesitancy or incontinence.  Denies headaches, seizures, numbness, or tingling. Denies depression, anxiety or insomnia. Denies skin break down or rash.   PE  BP 122/70 (BP Location: Right Arm, Patient Position: Sitting, Cuff Size: Large)   Pulse 94   Resp 12   Ht 5\' 4"  (1.626 m)   Wt 271 lb 0.6 oz (122.9 kg)   SpO2 97% Comment: room air  BMI 46.52 kg/m   Patient alert and oriented and in no cardiopulmonary distress.  HEENT: No facial asymmetry, EOMI,   oropharynx pink and moist.  Neck supple no JVD, no mass.  Chest: Clear to auscultation bilaterally.  CVS: S1, S2 no murmurs, no S3.Regular rate.  ABD: Soft non tender.   Ext: No edema  MS: Adequate ROM spine, shoulders, hips and knees.  Skin: Intact, no ulcerations or rash noted.  Psych: Good eye contact, normal affect. Memory intact not anxious or depressed appearing.  CNS: CN 2-12 intact, power,  normal throughout.no focal deficits noted.   Assessment & Plan  Malignant hypertension Controlled, no change in medication  DASH diet and commitment to daily physical activity for a minimum of 30 minutes discussed and encouraged, as a part of  hypertension management. The importance of attaining a healthy weight is also discussed.  BP/Weight 07/27/2018 06/02/2018 01/05/2018 11/05/2017 11/06/2015 2/56/3893 04/14/4286  Systolic BP 681 - 157 262 035 597 416  Diastolic BP 70 - 80 84 82 86 108  Wt. (Lbs) 271.04 273 274.12 280 263 262 262.04  BMI 46.52 48.36 48.56 48.06 45.12 44.95 44.96       Morbid obesity Improving gradually, she is encouraged to continue this Patient re-educated about  the importance of commitment to a  minimum of 150 minutes of exercise per week.  The importance of healthy food choices with portion control discussed. Encouraged to start a food diary, count calories and to consider  joining a support group. Sample diet sheets offered. Goals set by the patient for the next several months.   Weight /BMI 07/27/2018 06/02/2018 01/05/2018  WEIGHT 271 lb 0.6 oz 273 lb 274 lb 1.9 oz  HEIGHT 5\' 4"  5\' 3"  5\' 3"   BMI 46.52 kg/m2 48.36 kg/m2 48.56 kg/m2      Need for immunization against influenza After obtaining informed consent, the vaccine is  administered by LPN.

## 2018-08-29 NOTE — Assessment & Plan Note (Signed)
Improving gradually, she is encouraged to continue this Patient re-educated about  the importance of commitment to a  minimum of 150 minutes of exercise per week.  The importance of healthy food choices with portion control discussed. Encouraged to start a food diary, count calories and to consider  joining a support group. Sample diet sheets offered. Goals set by the patient for the next several months.   Weight /BMI 07/27/2018 06/02/2018 01/05/2018  WEIGHT 271 lb 0.6 oz 273 lb 274 lb 1.9 oz  HEIGHT 5\' 4"  5\' 3"  5\' 3"   BMI 46.52 kg/m2 48.36 kg/m2 48.56 kg/m2

## 2018-08-29 NOTE — Assessment & Plan Note (Signed)
After obtaining informed consent, the vaccine is  administered by LPN.  

## 2018-11-11 ENCOUNTER — Ambulatory Visit (HOSPITAL_COMMUNITY): Payer: BLUE CROSS/BLUE SHIELD

## 2018-11-24 ENCOUNTER — Other Ambulatory Visit: Payer: Self-pay | Admitting: Family Medicine

## 2018-11-24 DIAGNOSIS — I10 Essential (primary) hypertension: Secondary | ICD-10-CM

## 2018-12-02 ENCOUNTER — Other Ambulatory Visit: Payer: Self-pay | Admitting: Family Medicine

## 2018-12-02 ENCOUNTER — Encounter: Payer: Self-pay | Admitting: Family Medicine

## 2018-12-03 ENCOUNTER — Other Ambulatory Visit: Payer: Self-pay | Admitting: Family Medicine

## 2018-12-03 ENCOUNTER — Other Ambulatory Visit: Payer: Self-pay

## 2018-12-03 DIAGNOSIS — I10 Essential (primary) hypertension: Secondary | ICD-10-CM

## 2018-12-03 MED ORDER — AMLODIPINE BESYLATE 10 MG PO TABS: 10.0000 mg | ORAL_TABLET | Freq: Every day | ORAL | 5 refills | Status: DC

## 2018-12-03 MED ORDER — AMLODIPINE BESYLATE 10 MG PO TABS: 10.0000 mg | ORAL_TABLET | Freq: Every day | ORAL | 0 refills | Status: DC

## 2018-12-03 MED ORDER — TRIAMTERENE-HCTZ 75-50 MG PO TABS: 1.0000 | ORAL_TABLET | Freq: Every day | ORAL | 5 refills | Status: DC

## 2018-12-03 MED ORDER — TRIAMTERENE-HCTZ 75-50 MG PO TABS: 1.0000 | ORAL_TABLET | Freq: Every day | ORAL | 0 refills | Status: DC

## 2018-12-04 ENCOUNTER — Other Ambulatory Visit: Payer: Self-pay | Admitting: Family Medicine

## 2018-12-04 MED ORDER — CLONIDINE HCL 0.3 MG PO TABS: 0.3000 mg | ORAL_TABLET | Freq: Three times a day (TID) | ORAL | 3 refills | Status: DC

## 2018-12-30 ENCOUNTER — Other Ambulatory Visit: Payer: Self-pay | Admitting: Family Medicine

## 2018-12-31 ENCOUNTER — Other Ambulatory Visit: Payer: Self-pay | Admitting: Family Medicine

## 2018-12-31 DIAGNOSIS — I10 Essential (primary) hypertension: Secondary | ICD-10-CM

## 2018-12-31 MED ORDER — AMLODIPINE BESYLATE 10 MG PO TABS: 10.0000 mg | ORAL_TABLET | Freq: Every day | ORAL | 0 refills | Status: DC

## 2018-12-31 MED ORDER — LISINOPRIL 10 MG PO TABS: 10.0000 mg | ORAL_TABLET | Freq: Every day | ORAL | 0 refills | Status: DC

## 2018-12-31 MED ORDER — LABETALOL HCL 200 MG PO TABS: 200.0000 mg | ORAL_TABLET | Freq: Three times a day (TID) | ORAL | 0 refills | Status: DC

## 2019-01-07 ENCOUNTER — Encounter: Payer: Self-pay | Admitting: *Deleted

## 2019-01-11 ENCOUNTER — Encounter: Payer: BLUE CROSS/BLUE SHIELD | Admitting: Family Medicine

## 2019-03-02 ENCOUNTER — Other Ambulatory Visit: Payer: Self-pay | Admitting: Family Medicine

## 2019-03-02 MED ORDER — LISINOPRIL 10 MG PO TABS: 10.0000 mg | ORAL_TABLET | Freq: Every day | ORAL | 0 refills | Status: DC

## 2019-03-02 MED ORDER — LABETALOL HCL 200 MG PO TABS: 200.0000 mg | ORAL_TABLET | Freq: Three times a day (TID) | ORAL | 0 refills | Status: DC

## 2019-03-14 DIAGNOSIS — H35033 Hypertensive retinopathy, bilateral: Secondary | ICD-10-CM | POA: Diagnosis not present

## 2019-04-01 ENCOUNTER — Other Ambulatory Visit: Payer: Self-pay | Admitting: Family Medicine

## 2019-04-04 MED ORDER — LISINOPRIL 10 MG PO TABS: 10.0000 mg | ORAL_TABLET | Freq: Every day | ORAL | 0 refills | Status: DC

## 2019-04-05 ENCOUNTER — Encounter: Payer: BLUE CROSS/BLUE SHIELD | Admitting: Family Medicine

## 2019-04-28 ENCOUNTER — Encounter: Payer: BC Managed Care – PPO | Admitting: Family Medicine

## 2019-05-02 ENCOUNTER — Other Ambulatory Visit: Payer: Self-pay

## 2019-05-02 MED ORDER — LISINOPRIL 10 MG PO TABS: 10.0000 mg | ORAL_TABLET | Freq: Every day | ORAL | 0 refills | Status: DC

## 2019-05-12 ENCOUNTER — Other Ambulatory Visit: Payer: Self-pay

## 2019-05-12 ENCOUNTER — Ambulatory Visit (INDEPENDENT_AMBULATORY_CARE_PROVIDER_SITE_OTHER): Payer: BC Managed Care – PPO | Admitting: Family Medicine

## 2019-05-12 VITALS — BP 120/82 | Resp 12 | Ht 64.0 in | Wt 275.0 lb

## 2019-05-12 DIAGNOSIS — I10 Essential (primary) hypertension: Secondary | ICD-10-CM

## 2019-05-12 DIAGNOSIS — Z1231 Encounter for screening mammogram for malignant neoplasm of breast: Secondary | ICD-10-CM | POA: Diagnosis not present

## 2019-05-12 DIAGNOSIS — R200 Anesthesia of skin: Secondary | ICD-10-CM

## 2019-05-12 DIAGNOSIS — R7301 Impaired fasting glucose: Secondary | ICD-10-CM

## 2019-05-12 DIAGNOSIS — Z820 Family history of epilepsy and other diseases of the nervous system: Secondary | ICD-10-CM

## 2019-05-12 DIAGNOSIS — E559 Vitamin D deficiency, unspecified: Secondary | ICD-10-CM

## 2019-05-12 DIAGNOSIS — Z0000 Encounter for general adult medical examination without abnormal findings: Secondary | ICD-10-CM

## 2019-05-12 MED ORDER — AMLODIPINE BESYLATE 10 MG PO TABS: 10.0000 mg | ORAL_TABLET | Freq: Every day | ORAL | 1 refills | Status: DC

## 2019-05-12 MED ORDER — LABETALOL HCL 200 MG PO TABS: 200.0000 mg | ORAL_TABLET | Freq: Three times a day (TID) | ORAL | 1 refills | Status: DC

## 2019-05-12 MED ORDER — CLONIDINE HCL 0.3 MG PO TABS: 0.3000 mg | ORAL_TABLET | Freq: Three times a day (TID) | ORAL | 1 refills | Status: DC

## 2019-05-12 MED ORDER — LISINOPRIL 10 MG PO TABS: 10.0000 mg | ORAL_TABLET | Freq: Every day | ORAL | 1 refills | Status: DC

## 2019-05-12 MED ORDER — ERGOCALCIFEROL 1.25 MG (50000 UT) PO CAPS: 50000.0000 [IU] | ORAL_CAPSULE | ORAL | 3 refills | Status: DC

## 2019-05-12 MED ORDER — TRIAMTERENE-HCTZ 75-50 MG PO TABS: 1.0000 | ORAL_TABLET | Freq: Every day | ORAL | 1 refills | Status: DC

## 2019-05-12 MED ORDER — POTASSIUM CHLORIDE ER 10 MEQ PO TBCR: 10.0000 meq | EXTENDED_RELEASE_TABLET | Freq: Two times a day (BID) | ORAL | 1 refills | Status: DC

## 2019-05-12 NOTE — Progress Notes (Signed)
    [name]     MRN: [id number]      DOB: Jan 21, 1971  HPI: Patient is in for annual physical exam. 2 week h/o left facial numbness, involving cheek, periorbital area, mouth and tongue, denies weakness of affected side, no other neurologic symptoms , f/h of MS in her sister Immunization is reviewed , and  Is up to date Looking forward with great anticipation for her gastric bypass surgery, still working on foood choice and eating habits, needs to and wants to increase exercise   PE: BP 120/82   Resp 12   Ht 5\' 4"  (1.626 m)   Wt 275 lb (124.7 kg)   SpO2 98%   BMI 47.20 kg/m    Pleasant  female, alert and oriented x 3, in no cardio-pulmonary distress. Afebrile. HEENT No facial trauma or asymetry. Sinuses non tender.  Extra occullar muscles intact, External ears normal, tympanic membranes clear. Oropharynx moist, no exudate. Neck: supple, no adenopathy,JVD or thyromegaly.No bruits.  Chest: Clear to ascultation bilaterally.No crackles or wheezes. Non tender to palpation  Breast: No asymetry,no masses or lumps. No tenderness. No nipple discharge or inversion. No axillary or supraclavicular adenopathy  Cardiovascular system; Heart sounds normal,  S1 and  S2 ,no S3.  No murmur, or thrill. Apical beat not displaced Peripheral pulses normal.  Abdomen: Soft, non tender, no organomegaly or masses. No bruits. Bowel sounds normal. No guarding, tenderness or rebound.   Musculoskeletal exam: Full ROM of spine, hips , shoulders and knees. No deformity ,swelling or crepitus noted. No muscle wasting or atrophy.   Neurologic: Decreased sensation of left side of face, otherwise normal CN exam Power, tone , and reflexes normal throughout. No disturbance in gait. No tremor.  Skin: Intact, no ulceration, erythema , scaling or rash noted. Pigmentation normal throughout  Psych; Normal mood and affect. Judgement and concentration normal   Assessment & Plan:  Annual physical  exam Annual exam as documented. Counseling done  re healthy lifestyle involving commitment to 150 minutes exercise per week, heart healthy diet, and attaining healthy weight.The importance of adequate sleep also discussed. Regular seat belt use and home safety, is also discussed. Changes in health habits are decided on by the patient with goals and time frames  set for achieving them. Immunization and cancer screening needs are specifically addressed at this visit.   Morbid obesity  Patient re-educated about  the importance of commitment to a  minimum of 150 minutes of exercise per week as able.  The importance of healthy food choices with portion control discussed, as well as eating regularly and within a 12 hour window most days. The need to choose "clean , green" food 50 to 75% of the time is discussed, as well as to make water the primary drink and set a goal of 64 ounces water daily.    Weight /BMI 05/12/2019 07/27/2018 06/02/2018  WEIGHT 275 lb 271 lb 0.6 oz 273 lb  HEIGHT 5\' 4"  5\' 4"  5\' 3"   BMI 47.2 kg/m2 46.52 kg/m2 48.36 kg/m2      Left facial numbness 2 week h/o acute left facial numbness, unprovoked and persisting.No other neurological symptoms and exam otherwise normal Sister dx with MS in her 16's , refer to neurology for eval and management

## 2019-05-12 NOTE — Patient Instructions (Signed)
F/U with MD in January, call if you need me sooner  Please schedule mammogram at checkout  Fasting CBC, lipid, cmp and eGFr, HBA1c, TSH and vit D in the next 1 week  I support your approach  Re weight management with surgery   You will be referred to Neurology re 2 week h/o left facial numbness involvoing cheek, lips and tongue  It is important that you exercise regularly at least 30 minutes 5 times a week. If you develop chest pain, have severe difficulty breathing, or feel very tired, stop exercising immediately and seek medical attention   Think about what you will eat, plan ahead. Choose " clean, green, fresh or frozen" over canned, processed or packaged foods which are more sugary, salty and fatty. 70 to 75% of food eaten should be vegetables and fruit. Three meals at set times with snacks allowed between meals, but they must be fruit or vegetables. Aim to eat over a 12 hour period , example 7 am to 7 pm, and STOP after  your last meal of the day. Drink water,generally about 64 ounces per day, no other drink is as healthy. Fruit juice is best enjoyed in a healthy way, by EATING the fruit.

## 2019-05-13 ENCOUNTER — Encounter: Payer: Self-pay | Admitting: Family Medicine

## 2019-05-13 DIAGNOSIS — R200 Anesthesia of skin: Secondary | ICD-10-CM | POA: Insufficient documentation

## 2019-05-13 NOTE — Assessment & Plan Note (Signed)
2 week h/o acute left facial numbness, unprovoked and persisting.No other neurological symptoms and exam otherwise normal Sister dx with MS in her 78's , refer to neurology for eval and management

## 2019-05-13 NOTE — Assessment & Plan Note (Signed)
  Patient re-educated about  the importance of commitment to a  minimum of 150 minutes of exercise per week as able.  The importance of healthy food choices with portion control discussed, as well as eating regularly and within a 12 hour window most days. The need to choose "clean , green" food 50 to 75% of the time is discussed, as well as to make water the primary drink and set a goal of 64 ounces water daily.    Weight /BMI 05/12/2019 07/27/2018 06/02/2018  WEIGHT 275 lb 271 lb 0.6 oz 273 lb  HEIGHT 5\' 4"  5\' 4"  5\' 3"   BMI 47.2 kg/m2 46.52 kg/m2 48.36 kg/m2

## 2019-05-13 NOTE — Assessment & Plan Note (Signed)

## 2019-05-19 ENCOUNTER — Inpatient Hospital Stay (HOSPITAL_COMMUNITY): Admission: RE | Admit: 2019-05-19 | Payer: BC Managed Care – PPO | Source: Ambulatory Visit

## 2019-06-28 ENCOUNTER — Ambulatory Visit: Payer: BLUE CROSS/BLUE SHIELD | Admitting: Diagnostic Neuroimaging

## 2019-07-05 ENCOUNTER — Other Ambulatory Visit: Payer: Self-pay

## 2019-07-05 ENCOUNTER — Ambulatory Visit: Payer: BC Managed Care – PPO | Admitting: Diagnostic Neuroimaging

## 2019-07-05 ENCOUNTER — Encounter: Payer: Self-pay | Admitting: *Deleted

## 2019-07-05 VITALS — BP 143/95 | HR 91 | Temp 97.7°F | Ht 63.0 in | Wt 281.8 lb

## 2019-07-05 DIAGNOSIS — R200 Anesthesia of skin: Secondary | ICD-10-CM

## 2019-07-05 DIAGNOSIS — R202 Paresthesia of skin: Secondary | ICD-10-CM | POA: Diagnosis not present

## 2019-07-05 NOTE — Patient Instructions (Signed)
-   check MRI brain 

## 2019-07-05 NOTE — Progress Notes (Signed)
[facility]  PATIENT: [name] DOB: 07/30/1971  REFERRING CLINICIAN: Bari Mantis HISTORY FROM: patient REASON FOR VISIT: new consult    HISTORICAL  CHIEF COMPLAINT:  Chief Complaint  Patient presents with  . Numbness    rm 6 New Pt, "left facial numbness x 5 weeks"    HISTORY OF PRESENT ILLNESS:   48 year old female here for evaluation of left facial numbness.  Symptoms started suddenly 5 weeks ago.  She has left upper middle and lower facial numbness.  No weakness.  No problems on left arm or left leg.  No problems in right-sided body.  For past year she has had intermittent numbness and tingling in her bilateral hands, waking up at night, having to shake them off the side of the bed.  Patient has had some intermittent numbness in feet.  No vision changes slurred speech or trouble swallowing.   REVIEW OF SYSTEMS: Full 14 system review of systems performed and negative with exception of: As per HPI.  ALLERGIES: No Known Allergies  HOME MEDICATIONS: Outpatient Medications Prior to Visit  Medication Sig Dispense Refill  . acetaminophen (TYLENOL) 500 MG tablet Take 1,000 mg by mouth every 6 (six) hours as needed for mild pain or moderate pain.     Marland Kitchen amLODipine (NORVASC) 10 MG tablet Take 1 tablet (10 mg total) by mouth daily. 90 tablet 1  . cloNIDine (CATAPRES) 0.3 MG tablet Take 1 tablet (0.3 mg total) by mouth 3 (three) times daily. 270 tablet 1  . ergocalciferol (VITAMIN D2) 1.25 MG (50000 UT) capsule Take 1 capsule (50,000 Units total) by mouth once a week. One capsule once weekly 12 capsule 3  . labetalol (NORMODYNE) 200 MG tablet Take 1 tablet (200 mg total) by mouth 3 (three) times daily. 270 tablet 1  . lisinopril (ZESTRIL) 10 MG tablet Take 1 tablet (10 mg total) by mouth at bedtime. 90 tablet 1  . potassium chloride (KLOR-CON 10) 10 MEQ tablet Take 1 tablet (10 mEq total) by mouth 2 (two) times daily. 180 tablet 1  .  triamterene-hydrochlorothiazide (MAXZIDE) 75-50 MG tablet Take 1 tablet by mouth daily. 90 tablet 1   No facility-administered medications prior to visit.     PAST MEDICAL HISTORY: Past Medical History:  Diagnosis Date  . Fibroid   . Hypertension   . Obesity     PAST SURGICAL HISTORY: Past Surgical History:  Procedure Laterality Date  . APPENDECTOMY    . bilateral breast reduction  2004  . CHOLECYSTECTOMY    . REDUCTION MAMMAPLASTY Bilateral   . removal of left ovarian cyst  2003  . TUBAL LIGATION  2011   Women's, removal of tubes 2013    FAMILY HISTORY: Family History  Problem Relation Age of Onset  . Coronary artery disease Mother   . Hypertension Mother   . Coronary artery disease Father   . Kidney disease Father   . Hypertension Father   . Multiple sclerosis Sister   . Hypertension Sister   . Stroke Maternal Grandmother   . Heart attack Maternal Grandfather   . Anesthesia problems Neg Hx   . Hypotension Neg Hx   . Malignant hyperthermia Neg Hx   . Pseudochol deficiency Neg Hx     SOCIAL HISTORY: Social History   Socioeconomic History  . Marital status: Single    Spouse name: Not on file  . Number of children: 4  . Years of education: Not on file  . Highest education level: Associate  degree: occupational, technical, or vocational program  Occupational History  . Not on file  Social Needs  . Financial resource strain: Not on file  . Food insecurity    Worry: Not on file    Inability: Not on file  . Transportation needs    Medical: Not on file    Non-medical: Not on file  Tobacco Use  . Smoking status: Never Smoker  . Smokeless tobacco: Never Used  Substance and Sexual Activity  . Alcohol use: Yes    Comment: rarely  . Drug use: No  . Sexual activity: Yes    Birth control/protection: Abstinence, Surgical  Lifestyle  . Physical activity    Days per week: Not on file    Minutes per session: Not on file  . Stress: Not on file  Relationships   . Social Herbalist on phone: Not on file    Gets together: Not on file    Attends religious service: Not on file    Active member of club or organization: Not on file    Attends meetings of clubs or organizations: Not on file    Relationship status: Not on file  . Intimate partner violence    Fear of current or ex partner: Not on file    Emotionally abused: Not on file    Physically abused: Not on file    Forced sexual activity: Not on file  Other Topics Concern  . Not on file  Social History Narrative   Lives with children   Caffeine- sodas, 48 oz daily     PHYSICAL EXAM  GENERAL EXAM/CONSTITUTIONAL: Vitals:  Vitals:   07/05/19 1456  BP: (!) 143/95  Pulse: 91  Temp: 97.7 F (36.5 C)  Weight: 281 lb 12.8 oz (127.8 kg)  Height: 5\' 3"  (1.6 m)     Body mass index is 49.92 kg/m. Wt Readings from Last 3 Encounters:  07/05/19 281 lb 12.8 oz (127.8 kg)  05/12/19 275 lb (124.7 kg)  07/27/18 271 lb 0.6 oz (122.9 kg)     Patient is in no distress; well developed, nourished and groomed; neck is supple  CARDIOVASCULAR:  Examination of carotid arteries is normal; no carotid bruits  Regular rate and rhythm, no murmurs  Examination of peripheral vascular system by observation and palpation is normal  EYES:  Ophthalmoscopic exam of optic discs and posterior segments is normal; no papilledema or hemorrhages  No exam data present  MUSCULOSKELETAL:  Gait, strength, tone, movements noted in Neurologic exam below  NEUROLOGIC: MENTAL STATUS:  No flowsheet data found.  awake, alert, oriented to person, place and time  recent and remote memory intact  normal attention and concentration  language fluent, comprehension intact, naming intact  fund of knowledge appropriate  CRANIAL NERVE:   2nd - no papilledema on fundoscopic exam  2nd, 3rd, 4th, 6th - pupils equal and reactive to light, visual fields full to confrontation, extraocular muscles intact,  no nystagmus  5th - facial sensation --> DECR IN LEFT V1, V2, V3  7th - facial strength symmetric  8th - hearing intact  9th - palate elevates symmetrically, uvula midline  11th - shoulder shrug symmetric  12th - tongue protrusion midline  MOTOR:   normal bulk and tone, full strength in the BUE, BLE  SENSORY:   normal and symmetric to light touch, temperature, vibration  COORDINATION:   finger-nose-finger, fine finger movements normal  REFLEXES:   deep tendon reflexes present and symmetric  GAIT/STATION:  narrow based gait     DIAGNOSTIC DATA (LABS, IMAGING, TESTING) - I reviewed patient records, labs, notes, testing and imaging myself where available.  Lab Results  Component Value Date   WBC 6.4 11/05/2017   HGB 11.9 11/05/2017   HCT 35.4 11/05/2017   MCV 88.5 11/05/2017   PLT 234 11/05/2017      Component Value Date/Time   NA 140 11/05/2017 0906   K 4.0 11/05/2017 0906   CL 108 11/05/2017 0906   CO2 26 11/05/2017 0906   GLUCOSE 88 11/05/2017 0906   BUN 12 11/05/2017 0906   CREATININE 0.76 11/05/2017 0906   CALCIUM 8.7 11/05/2017 0906   PROT 6.4 11/05/2017 0906   ALBUMIN 3.7 11/07/2014 1528   AST 21 11/05/2017 0906   ALT 22 11/05/2017 0906   ALKPHOS 109 11/07/2014 1528   BILITOT 1.0 11/05/2017 0906   GFRNONAA 95 11/05/2017 0906   GFRAA 110 11/05/2017 0906   Lab Results  Component Value Date   CHOL 161 11/05/2017   HDL 52 11/05/2017   LDLCALC 91 11/05/2017   TRIG 89 11/05/2017   CHOLHDL 3.1 11/05/2017   Lab Results  Component Value Date   HGBA1C 5.0 11/05/2017   No results found for: [id number] Lab Results  Component Value Date   TSH 1.89 11/05/2017       ASSESSMENT AND PLAN  48 y.o. year old female here with new onset left facial numbness in August 2020.  History of hypertension.  We will proceed with work-up.  Ddx: stroke vs demyelinating dz  1. Numbness and tingling of left side of face     PLAN:  - check MRI brain   Orders Placed This Encounter  Procedures  . MR BRAIN W WO CONTRAST   Return pending test results.    Penni Bombard, MD [id number], [id number] PM Certified in Neurology, Neurophysiology and Neuroimaging  [facility] [address], [place], [place] [address] [phone]

## 2019-07-11 ENCOUNTER — Telehealth: Payer: Self-pay | Admitting: Diagnostic Neuroimaging

## 2019-07-11 NOTE — Telephone Encounter (Signed)
no to the covid -19 questions MR Brain w/wo contrast Dr. Conley Simmonds Auth: [id number] (exp. 07/11/19 to 01/07/20). Patient is scheduled at [facility] for 07/12/19.

## 2019-07-12 ENCOUNTER — Ambulatory Visit: Payer: BC Managed Care – PPO

## 2019-07-12 ENCOUNTER — Telehealth: Payer: Self-pay | Admitting: *Deleted

## 2019-07-12 ENCOUNTER — Other Ambulatory Visit: Payer: Self-pay

## 2019-07-12 DIAGNOSIS — R200 Anesthesia of skin: Secondary | ICD-10-CM | POA: Diagnosis not present

## 2019-07-12 DIAGNOSIS — R202 Paresthesia of skin: Secondary | ICD-10-CM

## 2019-07-12 DIAGNOSIS — D333 Benign neoplasm of cranial nerves: Secondary | ICD-10-CM

## 2019-07-12 MED ORDER — GADOBENATE DIMEGLUMINE 529 MG/ML IV SOLN
20.0000 mL | Freq: Once | INTRAVENOUS | Status: AC | PRN
  Administered 2019-07-12: 20 mL via INTRAVENOUS

## 2019-07-12 NOTE — Telephone Encounter (Signed)
MRI tech came into office and requested Dr Leta Baptist look at patient's images soon. Message sent to Dr Leta Baptist, high importance.

## 2019-07-13 NOTE — Addendum Note (Signed)
Addended by: Andrey Spearman R on: 07/13/2019 09:51 AM   Modules accepted: Orders

## 2019-07-13 NOTE — Telephone Encounter (Signed)
I called patient. Reviewed results. Left CP angle meningioma vs schwannoma. Will refer to neurosurgery.   Orders Placed This Encounter  Procedures  . Ambulatory referral to Neurosurgery   Penni Bombard, MD [id number], [id number] AM Certified in Neurology, Neurophysiology and Neuroimaging  [facility] [address] [place], [place] [address] [phone]

## 2019-07-15 DIAGNOSIS — D333 Benign neoplasm of cranial nerves: Secondary | ICD-10-CM | POA: Diagnosis present

## 2019-07-15 DIAGNOSIS — I10 Essential (primary) hypertension: Secondary | ICD-10-CM | POA: Diagnosis not present

## 2019-07-15 DIAGNOSIS — Z6841 Body Mass Index (BMI) 40.0 and over, adult: Secondary | ICD-10-CM | POA: Diagnosis not present

## 2019-07-18 ENCOUNTER — Other Ambulatory Visit: Payer: Self-pay

## 2019-07-18 ENCOUNTER — Ambulatory Visit (INDEPENDENT_AMBULATORY_CARE_PROVIDER_SITE_OTHER): Payer: BC Managed Care – PPO

## 2019-07-18 DIAGNOSIS — Z23 Encounter for immunization: Secondary | ICD-10-CM

## 2019-07-28 DIAGNOSIS — Z6841 Body Mass Index (BMI) 40.0 and over, adult: Secondary | ICD-10-CM | POA: Diagnosis not present

## 2019-07-28 DIAGNOSIS — I10 Essential (primary) hypertension: Secondary | ICD-10-CM | POA: Diagnosis not present

## 2019-07-28 DIAGNOSIS — D333 Benign neoplasm of cranial nerves: Secondary | ICD-10-CM | POA: Diagnosis not present

## 2019-07-29 ENCOUNTER — Other Ambulatory Visit: Payer: Self-pay | Admitting: Neurological Surgery

## 2019-08-09 ENCOUNTER — Other Ambulatory Visit: Payer: Self-pay

## 2019-08-09 ENCOUNTER — Encounter (HOSPITAL_COMMUNITY): Payer: Self-pay

## 2019-08-09 ENCOUNTER — Encounter (HOSPITAL_COMMUNITY)
Admission: RE | Admit: 2019-08-09 | Discharge: 2019-08-09 | Disposition: A | Discharge status: Home / Self Care | Payer: BC Managed Care – PPO | Source: Ambulatory Visit | Attending: Neurological Surgery | Admitting: Neurological Surgery

## 2019-08-09 DIAGNOSIS — Z01818 Encounter for other preprocedural examination: Secondary | ICD-10-CM | POA: Diagnosis not present

## 2019-08-09 DIAGNOSIS — I10 Essential (primary) hypertension: Secondary | ICD-10-CM | POA: Insufficient documentation

## 2019-08-09 HISTORY — DX: Other fracture of unspecified lower leg, initial encounter for closed fracture: S82.899A

## 2019-08-09 HISTORY — DX: Anesthesia of skin: R20.0

## 2019-08-09 LAB — CBC
HCT: 41.5 % (ref 36.0–46.0)
Hemoglobin: 13.2 g/dL (ref 12.0–15.0)
MCH: 30.6 pg (ref 26.0–34.0)
MCHC: 31.8 g/dL (ref 30.0–36.0)
MCV: 96.3 fL (ref 80.0–100.0)
Platelets: 266 10*3/uL (ref 150–400)
RBC: 4.31 MIL/uL (ref 3.87–5.11)
RDW: 13 % (ref 11.5–15.5)
WBC: 8.5 10*3/uL (ref 4.0–10.5)
nRBC: 0 % (ref 0.0–0.2)

## 2019-08-09 LAB — BASIC METABOLIC PANEL
Anion gap: 8 (ref 5–15)
BUN: 10 mg/dL (ref 6–20)
CO2: 26 mmol/L (ref 22–32)
Calcium: 9.2 mg/dL (ref 8.9–10.3)
Chloride: 104 mmol/L (ref 98–111)
Creatinine, Ser: 0.84 mg/dL (ref 0.44–1.00)
GFR calc Af Amer: >60 mL/min (ref >60)
GFR calc non Af Amer: >60 mL/min (ref >60)
Glucose, Bld: 115 mg/dL — ABNORMAL HIGH (ref 70–99)
Potassium: 3.6 mmol/L (ref 3.5–5.1)
Sodium: 138 mmol/L (ref 135–145)

## 2019-08-09 LAB — TYPE AND SCREEN
ABO/RH(D): O POS
Antibody Screen: NEGATIVE

## 2019-08-09 LAB — ABO/RH: ABO/RH(D): O POS

## 2019-08-09 MED ORDER — CHLORHEXIDINE GLUCONATE CLOTH 2 % EX PADS: 6.0000 | MEDICATED_PAD | Freq: Once | CUTANEOUS | Status: DC

## 2019-08-09 NOTE — Progress Notes (Signed)
PCP - M SIMPSON IN [place] ---MEDICAL CLEARANCE Cardiologist - NA   Chest x-ray - NA EKG -TODAY  StreSS--3/15 ECHO - NA Cardiac Cath - NA  Sleep Study - NA CPAP -NA       Aspirin Instructions:STOP   COVID TEST- FRIDAY   Anesthesia review: HX HTN  Patient denies shortness of breath, fever, cough and chest pain at PAT appointment   All instructions explained to the patient, with a verbal understanding of the material. Patient agrees to go over the instructions while at home for a better understanding. Patient also instructed to self quarantine after being tested for COVID-19. The opportunity to ask questions was provided.

## 2019-08-09 NOTE — Pre-Procedure Instructions (Signed)
[name]  08/09/2019      [facility] - [place], [place] - [id number] [address] [place] [address] Phone: [phone] Fax: [phone]    Your procedure is scheduled on 08/16/19.  Report to [facility] Admitting at 730 A.M.  Call this number if you have problems the morning of surgery:  [phone]   Remember:  Do not eat or drink after midnight.     Take these medicines the morning of surgery with A SIP OF WATER ---TYLENOL,NORVASC,CLONIDINE,LABETALOL    Do not wear jewelry, make-up or nail polish.  Do not wear lotions, powders, or perfumes, or deodorant.  Do not shave 48 hours prior to surgery.  Men may shave face and neck.  Do not bring valuables to the hospital.  [facility] is not responsible for any belongings or valuables.  Contacts, dentures or bridgework may not be worn into surgery.  Leave your suitcase in the car.  After surgery it may be brought to your room.  For patients admitted to the hospital, discharge time will be determined by your treatment team.  Patients discharged the day of surgery will not be allowed to drive home.    Special instructions:  PCP -  Cardiologist -   PPM/ICD -  Device Orders -  Rep Notified -   Chest x-ray -  EKG -  Stress Test -  ECHO -  Cardiac Cath -   Sleep Study -  CPAP -   Fasting Blood Sugar -  Checks Blood Sugar _____ times a day  Blood Thinner Instructions: Aspirin Instructions:  ERAS Protcol - PRE-SURGERY Ensure or G2-   COVID TEST-    Anesthesia review:   Patient denies shortness of breath, fever, cough and chest pain at PAT appointment   All instructions explained to the patient, with a verbal understanding of the material. Patient agrees to go over the instructions while at home for a better understanding. Patient also instructed to self quarantine after being tested for COVID-19. The opportunity to ask questions was provided.    Please read over  the following fact sheets that you were given.    [facility] - Preparing for Surgery  Before surgery, you can play an important role.  Because skin is not sterile, your skin needs to be as free of germs as possible.  You can reduce the number of germs on you skin by washing with CHG (chlorahexidine gluconate) soap before surgery.  CHG is an antiseptic cleaner which kills germs and bonds with the skin to continue killing germs even after washing.  Oral Hygiene is also important in reducing the risk of infection.  Remember to brush your teeth with your regular toothpaste the morning of surgery.  Please DO NOT use if you have an allergy to CHG or antibacterial soaps.  If your skin becomes reddened/irritated stop using the CHG and inform your nurse when you arrive at Short Stay.  Do not shave (including legs and underarms) for at least 48 hours prior to the first CHG shower.  You may shave your face.  Please follow these instructions carefully:   1.  Shower with CHG Soap the night before surgery and the morning of Surgery.  2.  If you choose to wash your hair, wash your hair first as usual with your normal shampoo.  3.  After you shampoo, rinse your hair and body thoroughly to remove the shampoo. 4.  Use CHG as  you would any other liquid soap.  You can apply chg directly to the skin and wash gently with a      scrungie or washcloth.           5.  Apply the CHG Soap to your body ONLY FROM THE NECK DOWN.   Do not use on open wounds or open sores. Avoid contact with your eyes, ears, mouth and genitals (private parts).  Wash genitals (private parts) with your normal soap.  6.  Wash thoroughly, paying special attention to the area where your surgery will be performed.  7.  Thoroughly rinse your body with warm water from the neck down.  8.  DO NOT shower/wash with your normal soap after using and rinsing off the CHG Soap.  9.  Pat yourself dry with a clean towel.            10.  Wear clean pajamas.             11.  Place clean sheets on your bed the night of your first shower and do not sleep with pets.  Day of Surgery  Do not apply any lotions/deoderants the morning of surgery.   Please wear clean clothes to the hospital/surgery center. Remember to brush your teeth with toothpaste.

## 2019-08-10 NOTE — Anesthesia Preprocedure Evaluation (Addendum)
Anesthesia Evaluation  Patient identified by MRN, date of birth, ID band Patient awake    Reviewed: Allergy & Precautions, NPO status , Patient's Chart, lab work & pertinent test results  Airway Mallampati: III  TM Distance: >3 FB Neck ROM: Full    Dental  (+) Teeth Intact, Dental Advisory Given   Pulmonary    breath sounds clear to auscultation       Cardiovascular hypertension,  Rhythm:Regular Rate:Normal     Neuro/Psych    GI/Hepatic   Endo/Other    Renal/GU      Musculoskeletal   Abdominal   Peds  Hematology   Anesthesia Other Findings   Reproductive/Obstetrics                            Anesthesia Physical Anesthesia Plan  ASA: III  Anesthesia Plan: General   Post-op Pain Management:    Induction: Intravenous  PONV Risk Score and Plan: Ondansetron and Dexamethasone  Airway Management Planned: Oral ETT  Additional Equipment: Arterial line  Intra-op Plan:   Post-operative Plan: Possible Post-op intubation/ventilation  Informed Consent: I have reviewed the patients History and Physical, chart, labs and discussed the procedure including the risks, benefits and alternatives for the proposed anesthesia with the patient or authorized representative who has indicated his/her understanding and acceptance.     Dental advisory given  Plan Discussed with: CRNA and Anesthesiologist  Anesthesia Plan Comments: (TTE 2015: - Left ventricle: The cavity size was normal. Systolic  function was normal. The estimated ejection fraction was  in the range of 55% to 60%. Moderate basal septal  hypertrophy. No gross regional wall motion abnormalities,  although endocardial visualization was suboptimal. There  was an increased relative contribution of atrial  contraction to ventricular filling. Doppler parameters are  consistent with abnormal left ventricular relaxation  (grade 1 diastolic  dysfunction). Doppler parameters are  consistent with high ventricular filling pressure.  - Aortic valve: Trivial regurgitation.  - Mitral valve: Moderately thickened leaflets anterior.  - Left atrium: The atrium was mildly dilated.  - Right ventricle: Systolic function may be mildly reduced.  - Inferior vena cava: The vessel was dilated; the  respirophasic diameter changes were blunted (< 50%);  findings are consistent with elevated central venous  pressure. )       Anesthesia Quick Evaluation

## 2019-08-12 ENCOUNTER — Other Ambulatory Visit (HOSPITAL_COMMUNITY)
Admission: RE | Admit: 2019-08-12 | Discharge: 2019-08-12 | Disposition: A | Discharge status: Home / Self Care | Payer: BC Managed Care – PPO | Source: Ambulatory Visit | Attending: Neurological Surgery | Admitting: Neurological Surgery

## 2019-08-12 ENCOUNTER — Other Ambulatory Visit: Payer: Self-pay

## 2019-08-12 DIAGNOSIS — Z20828 Contact with and (suspected) exposure to other viral communicable diseases: Secondary | ICD-10-CM | POA: Diagnosis not present

## 2019-08-12 DIAGNOSIS — Z01812 Encounter for preprocedural laboratory examination: Secondary | ICD-10-CM | POA: Insufficient documentation

## 2019-08-12 LAB — SARS CORONAVIRUS 2 (TAT 6-24 HRS): SARS Coronavirus 2: NEGATIVE

## 2019-08-15 ENCOUNTER — Other Ambulatory Visit: Payer: Self-pay

## 2019-08-15 ENCOUNTER — Ambulatory Visit (HOSPITAL_COMMUNITY)
Admission: RE | Admit: 2019-08-15 | Discharge: 2019-08-15 | Disposition: A | Discharge status: Home / Self Care | Payer: BC Managed Care – PPO | Source: Ambulatory Visit | Attending: Neurological Surgery | Admitting: Neurological Surgery

## 2019-08-15 ENCOUNTER — Other Ambulatory Visit: Payer: Self-pay | Admitting: Neurological Surgery

## 2019-08-15 ENCOUNTER — Other Ambulatory Visit (HOSPITAL_COMMUNITY): Payer: Self-pay | Admitting: Neurological Surgery

## 2019-08-15 DIAGNOSIS — Z8249 Family history of ischemic heart disease and other diseases of the circulatory system: Secondary | ICD-10-CM | POA: Diagnosis not present

## 2019-08-15 DIAGNOSIS — K219 Gastro-esophageal reflux disease without esophagitis: Secondary | ICD-10-CM | POA: Diagnosis not present

## 2019-08-15 DIAGNOSIS — D333 Benign neoplasm of cranial nerves: Secondary | ICD-10-CM

## 2019-08-15 DIAGNOSIS — E669 Obesity, unspecified: Secondary | ICD-10-CM | POA: Diagnosis not present

## 2019-08-15 DIAGNOSIS — I10 Essential (primary) hypertension: Secondary | ICD-10-CM | POA: Diagnosis not present

## 2019-08-15 DIAGNOSIS — R42 Dizziness and giddiness: Secondary | ICD-10-CM | POA: Diagnosis not present

## 2019-08-15 DIAGNOSIS — E559 Vitamin D deficiency, unspecified: Secondary | ICD-10-CM | POA: Diagnosis not present

## 2019-08-15 DIAGNOSIS — Z6841 Body Mass Index (BMI) 40.0 and over, adult: Secondary | ICD-10-CM | POA: Diagnosis not present

## 2019-08-15 IMAGING — MR MR HEAD WO/W CM
15 of 18 series · 44 of 48 positions shown · IV contrast (cc gad)
Comparison: MR head and IAC [DATE]

CLINICAL DATA: Vestibular schwannoma. Surgical planning. Head
without contrast [DATE]

EXAM:
MRI HEAD WITHOUT AND WITH CONTRAST
TECHNIQUE: Multiplanar, multiecho pulse sequences of the brain and surrounding
structures were obtained without and with intravenous contrast.
CONTRAST:  10mL GADAVIST GADOBUTROL 1 MMOL/ML IV SOLN

[Series 4: FLAIR · sagittal · 3.0mm · 0.51mm/px · 1 of 49 slices shown (1 of 2)]
[im 1/49]
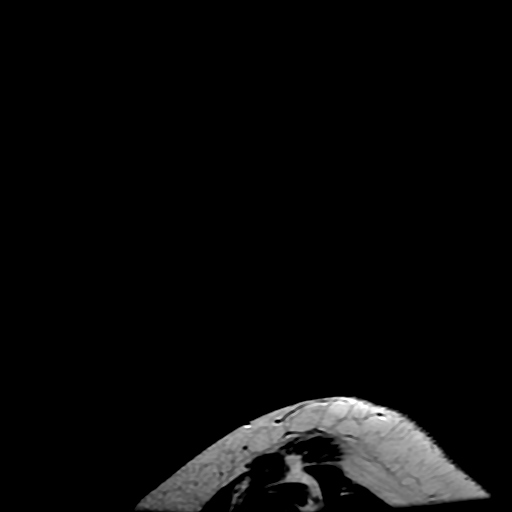

[Series 5: DWI · axial · 3.0mm · 1.09mm/px · 1 of 128 slices shown]
[im 1/128]
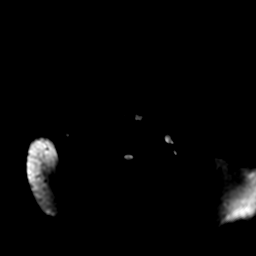

[Series 6: T2 · axial · 5.0mm · 0.51mm/px · 1 of 34 slices shown]
[im 1/34]
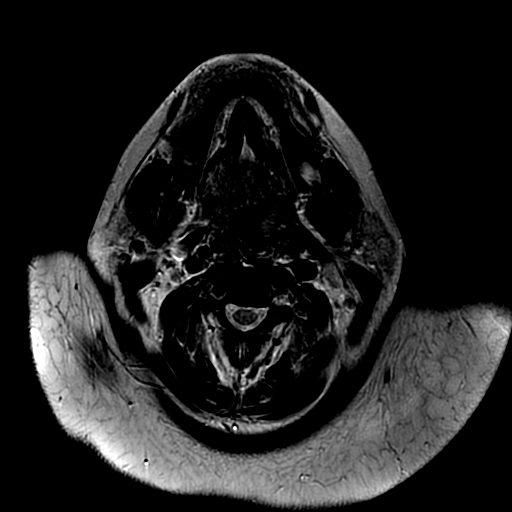

[Series 7: FLAIR · axial · 3.0mm · 0.51mm/px · 1 of 64 slices shown (2 of 2)]
[im 1/64]
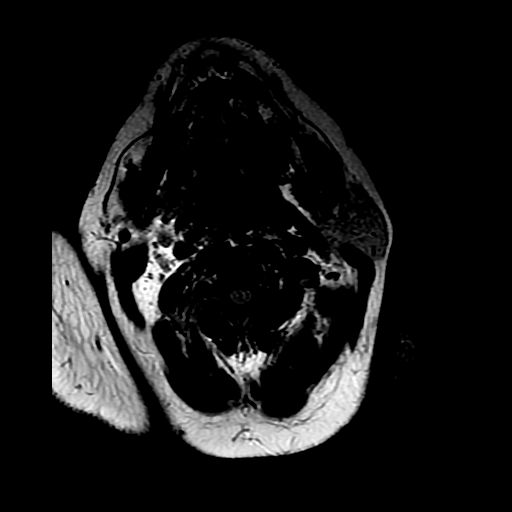

[Series 9: ax dti · axial · 3.0mm · 1.02mm/px · z∈[-121,+74]mm · 15 of 1716 slices shown]
[im 1/1716]
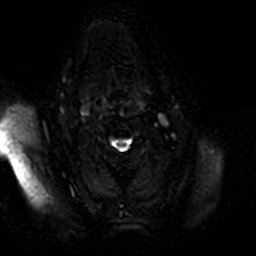
[im 123/1716]
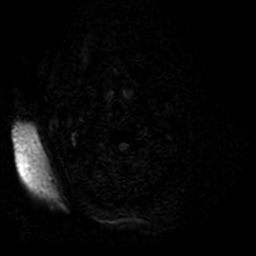
[im 246/1716]
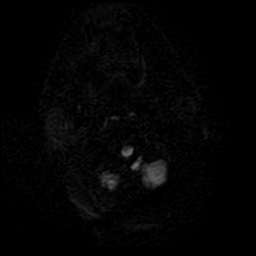
[im 368/1716]
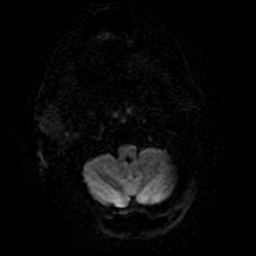
[im 491/1716]
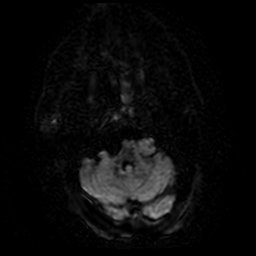
[im 613/1716]
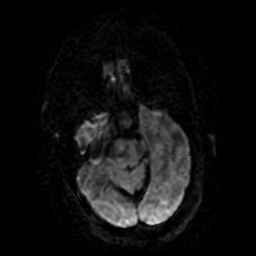
[im 736/1716]
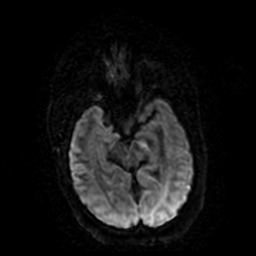
[im 858/1716]
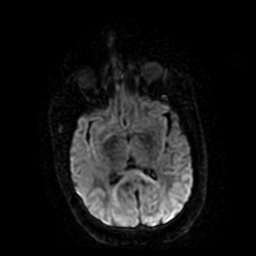
[im 981/1716]
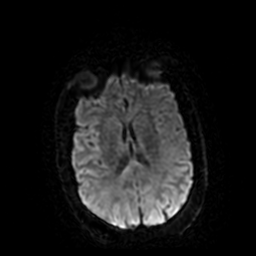
[im 1103/1716]
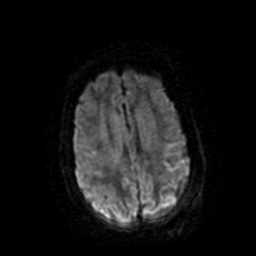
[im 1226/1716]
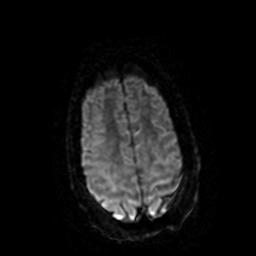
[im 1348/1716]
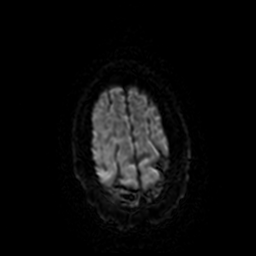
[im 1471/1716]
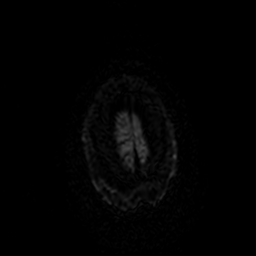
[im 1593/1716]
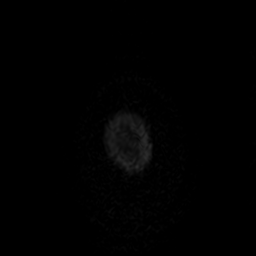
[im 1716/1716]
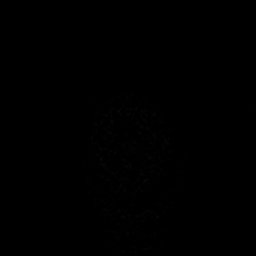

[Series 11: SWI · axial · 3.0mm · 0.51mm/px · 1 of 120 slices shown (1 of 2)]
[im 1/120]
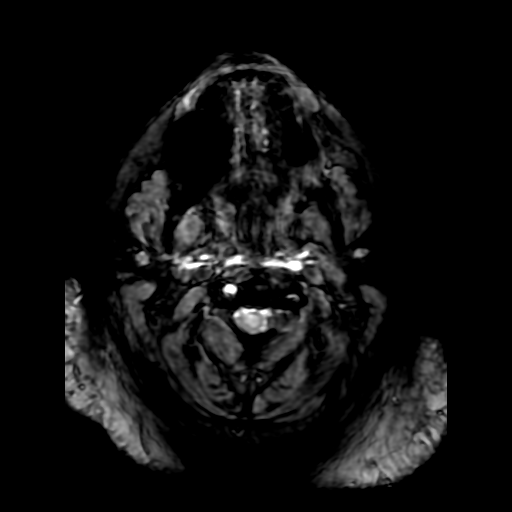

[Series 12: T2 post-contrast · coronal · 3.0mm · 0.47mm/px · 1 of 62 slices shown]
[im 1/62]
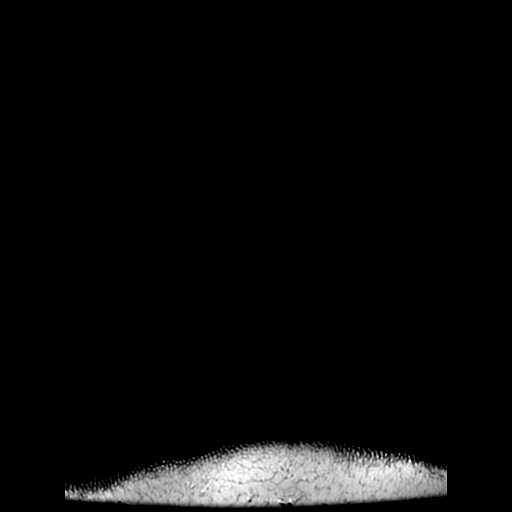

[Series 14: T1 post-contrast · coronal · 3.0mm · 0.47mm/px · 1 of 62 slices shown]
[im 1/62]
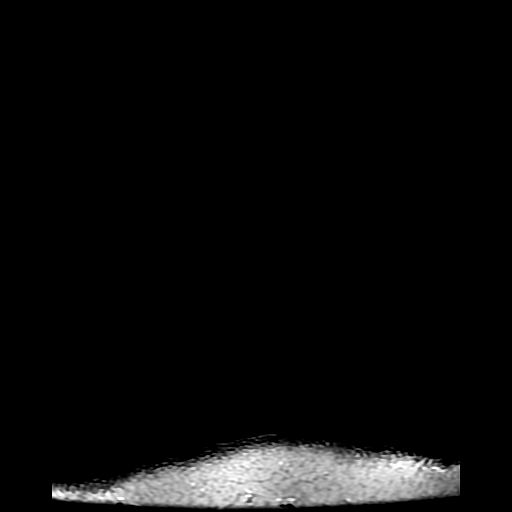

[Series 15: FLAIR post-contrast · sagittal · 3.0mm · 0.51mm/px · 1 of 49 slices shown]
[im 1/49]
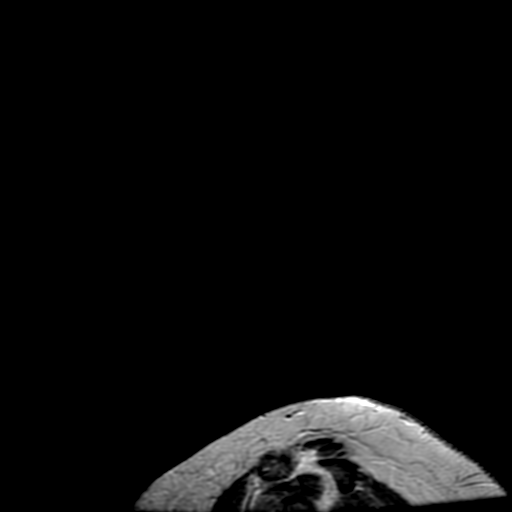

[Series 550: ADC · axial · 3.0mm · 1.09mm/px · 1 of 61 slices shown]
[im 1/61]
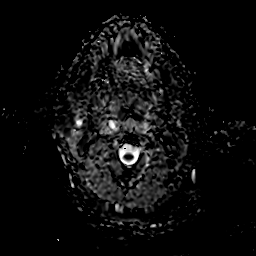

[Series 910: orig: ax dti · axial · 3.0mm · 1.02mm/px · z∈[-121,+74]mm · 16 of 1716 slices shown]
[im 1/1716]
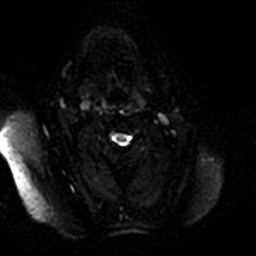
[im 115/1716]
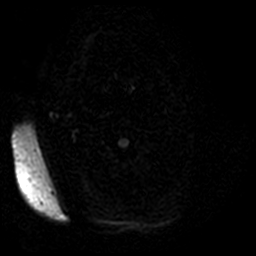
[im 229/1716]
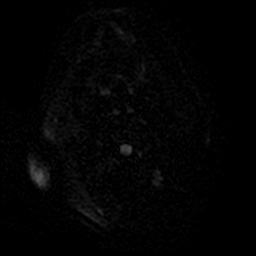
[im 344/1716]
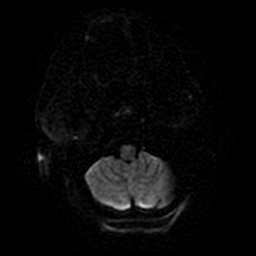
[im 458/1716]
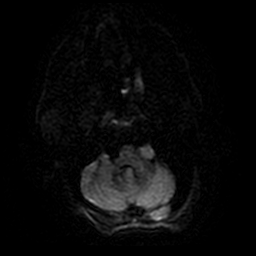
[im 572/1716]
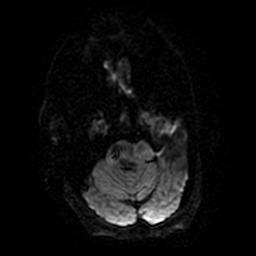
[im 687/1716]
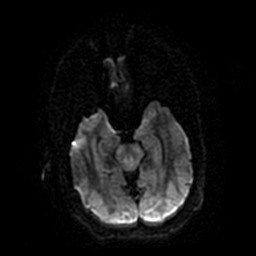
[im 801/1716]
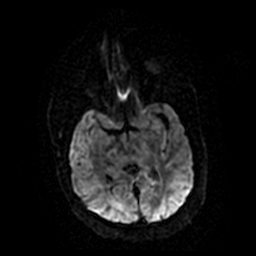
[im 915/1716]
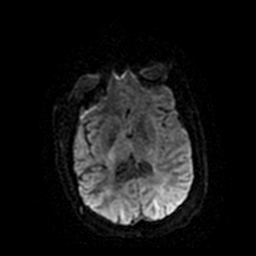
[im 1030/1716]
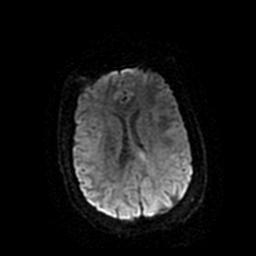
[im 1144/1716]
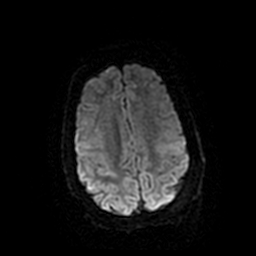
[im 1258/1716]
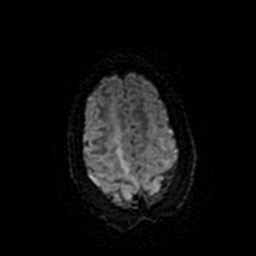
[im 1373/1716]
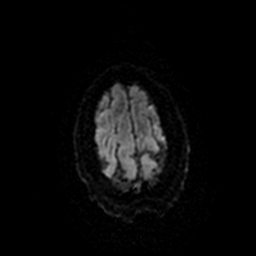
[im 1487/1716]
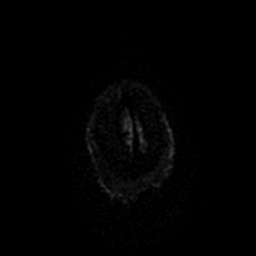
[im 1601/1716]
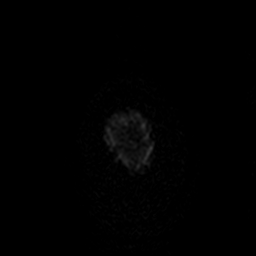
[im 1716/1716]
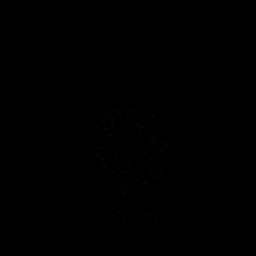

[Series 950: trace:(date) (date) est · axial · 3.0mm · 1.02mm/px · 1 of 63 slices shown]
[im 1/63]
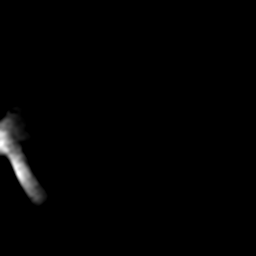

[Series 951: fa(no-q):(date) (date) est · axial · 3.0mm · 1.02mm/px · 1 of 55 slices shown]
[im 1/55]
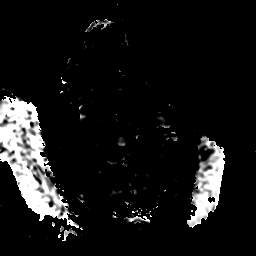

[Series 952: avdc (10^-6 mm²/s)(no-q):(date) · axial · 3.0mm · 1.02mm/px · 1 of 63 slices shown]
[im 1/63]
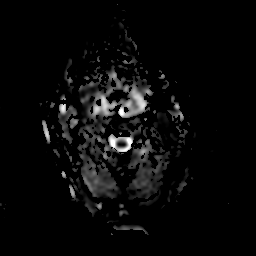

[Series 1100: SWI · axial · 3.0mm · 0.51mm/px · 1 of 119 slices shown (2 of 2)]
[im 1/119]
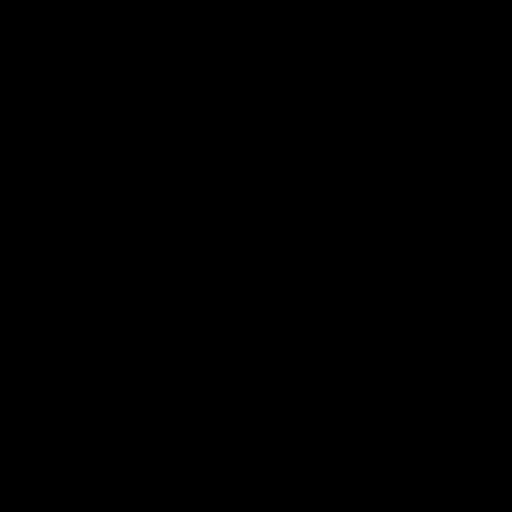

[44 of 48 positions shown; findings below may reference images not displayed]

FINDINGS: Brain: Conventional imaging of the brain demonstrates no acute
infarct, hemorrhage, or parenchymal mass. The ventricles are of
normal size. Periventricular and subcortical T2 hyperintensities are
within normal limits for age. No significant extraaxial fluid
collection is present.

A left CP angle mass measures 2.2 x 1.9 x 2.2 cm. There is slight
extension into the porous acusticus. Enhancement does not extend
deep into the internal auditory canal. The inner ear structures are
within normal limits. There is some mass effect on the brainstem and
left cerebellar peduncle.

No parenchymal enhancement is present.

Vascular: Flow is present in the major intracranial arteries.

Skull and upper cervical spine: The craniocervical junction is
normal. Upper cervical spine is within normal limits. Marrow signal
is unremarkable.

Sinuses/Orbits: The paranasal sinuses and mastoid air cells are
clear. The globes and orbits are within normal limits.
IMPRESSION: 1. 2.2 x 1.9 x 2.2 cm left CP angle mass compatible with a
vestibular schwannoma. There is some mass effect on the brainstem
and left cerebellar peduncle. There is minimal extension into the
left internal auditory canal.
2. Otherwise normal MRI appearance of the brain for age.

## 2019-08-15 MED ORDER — GADOBUTROL 1 MMOL/ML IV SOLN
10.0000 mL | Freq: Once | INTRAVENOUS | Status: AC | PRN
  Administered 2019-08-15: 10 mL via INTRAVENOUS

## 2019-08-15 MED ORDER — DEXTROSE 5 % IV SOLN
3.0000 g | INTRAVENOUS | Status: AC
  Administered 2019-08-16: 3 g via INTRAVENOUS
  Filled 2019-08-15: qty 3

## 2019-08-16 ENCOUNTER — Encounter (HOSPITAL_COMMUNITY): Payer: Self-pay

## 2019-08-16 ENCOUNTER — Inpatient Hospital Stay (HOSPITAL_COMMUNITY): Payer: BC Managed Care – PPO | Admitting: Certified Registered"

## 2019-08-16 ENCOUNTER — Inpatient Hospital Stay (HOSPITAL_COMMUNITY)
Admission: RE | Admit: 2019-08-16 | Discharge: 2019-08-19 | DRG: 026 | Disposition: A | Discharge status: Home / Self Care | Payer: BC Managed Care – PPO | Attending: Neurological Surgery | Admitting: Neurological Surgery

## 2019-08-16 ENCOUNTER — Inpatient Hospital Stay (HOSPITAL_COMMUNITY): Payer: BC Managed Care – PPO | Admitting: Physician Assistant

## 2019-08-16 ENCOUNTER — Encounter (HOSPITAL_COMMUNITY)
Admission: RE | Disposition: A | Discharge status: Home / Self Care | Payer: Self-pay | Source: Home / Self Care | Attending: Neurological Surgery

## 2019-08-16 DIAGNOSIS — D333 Benign neoplasm of cranial nerves: Principal | ICD-10-CM | POA: Diagnosis present

## 2019-08-16 DIAGNOSIS — Z6841 Body Mass Index (BMI) 40.0 and over, adult: Secondary | ICD-10-CM | POA: Diagnosis not present

## 2019-08-16 DIAGNOSIS — I10 Essential (primary) hypertension: Secondary | ICD-10-CM | POA: Diagnosis present

## 2019-08-16 DIAGNOSIS — E669 Obesity, unspecified: Secondary | ICD-10-CM | POA: Diagnosis present

## 2019-08-16 DIAGNOSIS — R42 Dizziness and giddiness: Secondary | ICD-10-CM | POA: Diagnosis not present

## 2019-08-16 DIAGNOSIS — Z8249 Family history of ischemic heart disease and other diseases of the circulatory system: Secondary | ICD-10-CM | POA: Diagnosis not present

## 2019-08-16 HISTORY — PX: CRANIOTOMY: SHX93

## 2019-08-16 HISTORY — PX: APPLICATION OF CRANIAL NAVIGATION: SHX6578

## 2019-08-16 LAB — POCT I-STAT 7, (LYTES, BLD GAS, ICA,H+H)
Acid-base deficit: 1 mmol/L (ref 0.0–2.0)
Bicarbonate: 24.5 mmol/L (ref 20.0–28.0)
Calcium, Ion: 1.21 mmol/L (ref 1.15–1.40)
HCT: 35 % — ABNORMAL LOW (ref 36.0–46.0)
Hemoglobin: 11.9 g/dL — ABNORMAL LOW (ref 12.0–15.0)
O2 Saturation: 99 %
Patient temperature: 35.5
Potassium: 3.2 mmol/L — ABNORMAL LOW (ref 3.5–5.1)
Sodium: 139 mmol/L (ref 135–145)
TCO2: 26 mmol/L (ref 22–32)
pCO2 arterial: 40.1 mmHg (ref 32.0–48.0)
pH, Arterial: 7.387 (ref 7.350–7.450)
pO2, Arterial: 148 mmHg — ABNORMAL HIGH (ref 83.0–108.0)

## 2019-08-16 LAB — POCT I-STAT, CHEM 8
BUN: 19 mg/dL (ref 6–20)
Calcium, Ion: 1.21 mmol/L (ref 1.15–1.40)
Chloride: 105 mmol/L (ref 98–111)
Creatinine, Ser: 0.7 mg/dL (ref 0.44–1.00)
Glucose, Bld: 116 mg/dL — ABNORMAL HIGH (ref 70–99)
HCT: 38 % (ref 36.0–46.0)
Hemoglobin: 12.9 g/dL (ref 12.0–15.0)
Potassium: 3.3 mmol/L — ABNORMAL LOW (ref 3.5–5.1)
Sodium: 139 mmol/L (ref 135–145)
TCO2: 24 mmol/L (ref 22–32)

## 2019-08-16 LAB — CBC
HCT: 39.5 % (ref 36.0–46.0)
Hemoglobin: 13.1 g/dL (ref 12.0–15.0)
MCH: 30.7 pg (ref 26.0–34.0)
MCHC: 33.2 g/dL (ref 30.0–36.0)
MCV: 92.5 fL (ref 80.0–100.0)
Platelets: 240 10*3/uL (ref 150–400)
RBC: 4.27 MIL/uL (ref 3.87–5.11)
RDW: 12.9 % (ref 11.5–15.5)
WBC: 12.5 10*3/uL — ABNORMAL HIGH (ref 4.0–10.5)
nRBC: 0 % (ref 0.0–0.2)

## 2019-08-16 LAB — CREATININE, SERUM
Creatinine, Ser: 0.78 mg/dL (ref 0.44–1.00)
GFR calc Af Amer: >60 mL/min (ref >60)
GFR calc non Af Amer: >60 mL/min (ref >60)

## 2019-08-16 LAB — POCT PREGNANCY, URINE: Preg Test, Ur: NEGATIVE

## 2019-08-16 SURGERY — CRANIOTOMY TUMOR EXCISION
Anesthesia: General

## 2019-08-16 MED ORDER — PROMETHAZINE HCL 25 MG PO TABS: 12.5000 mg | ORAL_TABLET | ORAL | Status: DC | PRN

## 2019-08-16 MED ORDER — OXYCODONE HCL 5 MG/5ML PO SOLN: 5.0000 mg | Freq: Once | ORAL | Status: DC | PRN

## 2019-08-16 MED ORDER — SUGAMMADEX SODIUM 200 MG/2ML IV SOLN
INTRAVENOUS | Status: DC | PRN
  Administered 2019-08-16: 200 mg via INTRAVENOUS

## 2019-08-16 MED ORDER — THROMBIN 20000 UNITS EX SOLR
CUTANEOUS | Status: DC | PRN
  Administered 2019-08-16: 20 mL via TOPICAL

## 2019-08-16 MED ORDER — FAMOTIDINE 20 MG PO TABS
20.0000 mg | ORAL_TABLET | Freq: Every day | ORAL | Status: DC
  Administered 2019-08-17 – 2019-08-19 (×2): 20 mg via ORAL
  Filled 2019-08-16 (×4): qty 1

## 2019-08-16 MED ORDER — ONDANSETRON HCL 4 MG/2ML IJ SOLN
INTRAMUSCULAR | Status: DC | PRN
  Administered 2019-08-16: 4 mg via INTRAVENOUS

## 2019-08-16 MED ORDER — ROCURONIUM BROMIDE 10 MG/ML (PF) SYRINGE
PREFILLED_SYRINGE | INTRAVENOUS | Status: AC
  Filled 2019-08-16: qty 10

## 2019-08-16 MED ORDER — CLONIDINE HCL 0.1 MG PO TABS
0.3000 mg | ORAL_TABLET | Freq: Every day | ORAL | Status: DC
  Administered 2019-08-17: 0.3 mg via ORAL
  Filled 2019-08-16: qty 1
  Filled 2019-08-16: qty 3
  Filled 2019-08-16: qty 1
  Filled 2019-08-16: qty 3

## 2019-08-16 MED ORDER — LIDOCAINE-EPINEPHRINE 1 %-1:100000 IJ SOLN
INTRAMUSCULAR | Status: AC
  Filled 2019-08-16: qty 1

## 2019-08-16 MED ORDER — FENTANYL CITRATE (PF) 100 MCG/2ML IJ SOLN
INTRAMUSCULAR | Status: DC | PRN
  Administered 2019-08-16: 50 ug via INTRAVENOUS

## 2019-08-16 MED ORDER — DEXAMETHASONE SODIUM PHOSPHATE 10 MG/ML IJ SOLN
INTRAMUSCULAR | Status: DC | PRN
  Administered 2019-08-16: 10 mg via INTRAVENOUS

## 2019-08-16 MED ORDER — ONDANSETRON HCL 4 MG/2ML IJ SOLN
INTRAMUSCULAR | Status: AC
  Filled 2019-08-16: qty 2

## 2019-08-16 MED ORDER — CEFAZOLIN SODIUM-DEXTROSE 2-4 GM/100ML-% IV SOLN
2.0000 g | Freq: Three times a day (TID) | INTRAVENOUS | Status: AC
  Administered 2019-08-16 – 2019-08-17 (×2): 2 g via INTRAVENOUS
  Filled 2019-08-16 (×2): qty 100

## 2019-08-16 MED ORDER — HEMOSTATIC AGENTS (NO CHARGE) OPTIME
TOPICAL | Status: DC | PRN
  Administered 2019-08-16: 1 via TOPICAL

## 2019-08-16 MED ORDER — DOCUSATE SODIUM 100 MG PO CAPS
100.0000 mg | ORAL_CAPSULE | Freq: Two times a day (BID) | ORAL | Status: DC
  Administered 2019-08-17 – 2019-08-19 (×4): 100 mg via ORAL
  Filled 2019-08-16 (×5): qty 1

## 2019-08-16 MED ORDER — DEXAMETHASONE SODIUM PHOSPHATE 10 MG/ML IJ SOLN
INTRAMUSCULAR | Status: AC
  Filled 2019-08-16: qty 1

## 2019-08-16 MED ORDER — FENTANYL CITRATE (PF) 100 MCG/2ML IJ SOLN
INTRAMUSCULAR | Status: AC
  Filled 2019-08-16: qty 2

## 2019-08-16 MED ORDER — OXYCODONE HCL 5 MG PO TABS: 5.0000 mg | ORAL_TABLET | Freq: Once | ORAL | Status: DC | PRN

## 2019-08-16 MED ORDER — ONDANSETRON HCL 4 MG/2ML IJ SOLN
4.0000 mg | Freq: Once | INTRAMUSCULAR | Status: AC
  Administered 2019-08-16: 4 mg via INTRAVENOUS

## 2019-08-16 MED ORDER — ROCURONIUM BROMIDE 50 MG/5ML IV SOSY
PREFILLED_SYRINGE | INTRAVENOUS | Status: DC | PRN
  Administered 2019-08-16: 70 mg via INTRAVENOUS

## 2019-08-16 MED ORDER — SODIUM CHLORIDE 0.9 % IV SOLN
INTRAVENOUS | Status: DC
  Administered 2019-08-16 (×3): via INTRAVENOUS

## 2019-08-16 MED ORDER — FENTANYL CITRATE (PF) 250 MCG/5ML IJ SOLN
INTRAMUSCULAR | Status: AC
  Filled 2019-08-16: qty 5

## 2019-08-16 MED ORDER — LABETALOL HCL 5 MG/ML IV SOLN: 10.0000 mg | INTRAVENOUS | Status: DC | PRN

## 2019-08-16 MED ORDER — SODIUM CHLORIDE 0.9 % IV SOLN: INTRAVENOUS | Status: DC | PRN

## 2019-08-16 MED ORDER — HYDROCODONE-ACETAMINOPHEN 5-325 MG PO TABS
1.0000 | ORAL_TABLET | ORAL | Status: DC | PRN
  Administered 2019-08-17 – 2019-08-19 (×8): 1 via ORAL
  Filled 2019-08-16 (×8): qty 1

## 2019-08-16 MED ORDER — METOCLOPRAMIDE HCL 5 MG/ML IJ SOLN
INTRAMUSCULAR | Status: AC
  Filled 2019-08-16: qty 2

## 2019-08-16 MED ORDER — HYDROMORPHONE HCL 1 MG/ML IJ SOLN
0.5000 mg | INTRAMUSCULAR | Status: DC | PRN
  Administered 2019-08-16 – 2019-08-19 (×10): 0.5 mg via INTRAVENOUS
  Filled 2019-08-16 (×11): qty 1

## 2019-08-16 MED ORDER — ACETAMINOPHEN 650 MG RE SUPP: 650.0000 mg | RECTAL | Status: DC | PRN

## 2019-08-16 MED ORDER — PROMETHAZINE HCL 25 MG/ML IJ SOLN
12.5000 mg | INTRAMUSCULAR | Status: DC | PRN
  Administered 2019-08-16 (×2): 12.5 mg via INTRAVENOUS
  Administered 2019-08-17 – 2019-08-18 (×5): 25 mg via INTRAVENOUS
  Filled 2019-08-16 (×6): qty 1

## 2019-08-16 MED ORDER — SODIUM CHLORIDE 0.9 % IV SOLN
0.0125 ug/kg/min | INTRAVENOUS | Status: AC
  Filled 2019-08-16 (×2): qty 2000

## 2019-08-16 MED ORDER — TRIAMTERENE-HCTZ 75-50 MG PO TABS
1.0000 | ORAL_TABLET | Freq: Every day | ORAL | Status: DC
  Administered 2019-08-17: 1 via ORAL
  Filled 2019-08-16 (×4): qty 1

## 2019-08-16 MED ORDER — ONDANSETRON HCL 4 MG PO TABS: 4.0000 mg | ORAL_TABLET | ORAL | Status: DC | PRN

## 2019-08-16 MED ORDER — POLYETHYLENE GLYCOL 3350 17 G PO PACK: 17.0000 g | PACK | Freq: Every day | ORAL | Status: DC | PRN

## 2019-08-16 MED ORDER — METOCLOPRAMIDE HCL 5 MG/ML IJ SOLN
10.0000 mg | Freq: Once | INTRAMUSCULAR | Status: AC
  Administered 2019-08-16: 10 mg via INTRAVENOUS

## 2019-08-16 MED ORDER — ONDANSETRON HCL 4 MG/2ML IJ SOLN
4.0000 mg | Freq: Once | INTRAMUSCULAR | Status: AC | PRN
  Administered 2019-08-16: 4 mg via INTRAVENOUS

## 2019-08-16 MED ORDER — BACITRACIN ZINC 500 UNIT/GM EX OINT
TOPICAL_OINTMENT | CUTANEOUS | Status: AC
  Filled 2019-08-16: qty 28.35

## 2019-08-16 MED ORDER — GLYCOPYRROLATE 0.2 MG/ML IJ SOLN
INTRAMUSCULAR | Status: DC | PRN
  Administered 2019-08-16: .2 mg via INTRAVENOUS

## 2019-08-16 MED ORDER — ONDANSETRON HCL 4 MG/2ML IJ SOLN
4.0000 mg | INTRAMUSCULAR | Status: DC | PRN
  Administered 2019-08-17 – 2019-08-19 (×3): 4 mg via INTRAVENOUS
  Filled 2019-08-16 (×3): qty 2

## 2019-08-16 MED ORDER — LIDOCAINE 2% (20 MG/ML) 5 ML SYRINGE
INTRAMUSCULAR | Status: DC | PRN
  Administered 2019-08-16: 80 mg via INTRAVENOUS

## 2019-08-16 MED ORDER — PHENYLEPHRINE HCL-NACL 10-0.9 MG/250ML-% IV SOLN
INTRAVENOUS | Status: DC | PRN
  Administered 2019-08-16: 20 ug/min via INTRAVENOUS

## 2019-08-16 MED ORDER — THROMBIN 5000 UNITS EX SOLR
CUTANEOUS | Status: AC
  Filled 2019-08-16: qty 5000

## 2019-08-16 MED ORDER — ACETAMINOPHEN 325 MG PO TABS: 650.0000 mg | ORAL_TABLET | ORAL | Status: DC | PRN

## 2019-08-16 MED ORDER — 0.9 % SODIUM CHLORIDE (POUR BTL) OPTIME
TOPICAL | Status: DC | PRN
  Administered 2019-08-16: 3000 mL

## 2019-08-16 MED ORDER — SODIUM CHLORIDE 0.9 % IV SOLN
INTRAVENOUS | Status: DC | PRN
  Administered 2019-08-16: .2 ug/kg/min via INTRAVENOUS

## 2019-08-16 MED ORDER — BACITRACIN ZINC 500 UNIT/GM EX OINT
TOPICAL_OINTMENT | CUTANEOUS | Status: DC | PRN
  Administered 2019-08-16 (×2): 1 via TOPICAL

## 2019-08-16 MED ORDER — THROMBIN 5000 UNITS EX SOLR
OROMUCOSAL | Status: DC | PRN
  Administered 2019-08-16: 5 mL via TOPICAL

## 2019-08-16 MED ORDER — PROMETHAZINE HCL 12.5 MG PO TABS
12.5000 mg | ORAL_TABLET | ORAL | Status: DC | PRN
  Filled 2019-08-16: qty 2

## 2019-08-16 MED ORDER — PROPOFOL 10 MG/ML IV BOLUS
INTRAVENOUS | Status: AC
  Filled 2019-08-16: qty 40

## 2019-08-16 MED ORDER — SODIUM CHLORIDE 0.9 % IV SOLN
INTRAVENOUS | Status: DC | PRN
  Administered 2019-08-16: 10:00:00 via INTRAVENOUS

## 2019-08-16 MED ORDER — LIDOCAINE-EPINEPHRINE 1 %-1:100000 IJ SOLN
INTRAMUSCULAR | Status: DC | PRN
  Administered 2019-08-16: 8 mL

## 2019-08-16 MED ORDER — LABETALOL HCL 200 MG PO TABS
200.0000 mg | ORAL_TABLET | Freq: Every day | ORAL | Status: DC
  Administered 2019-08-17: 200 mg via ORAL
  Filled 2019-08-16 (×2): qty 1
  Filled 2019-08-16: qty 2
  Filled 2019-08-16: qty 1

## 2019-08-16 MED ORDER — LISINOPRIL 10 MG PO TABS
10.0000 mg | ORAL_TABLET | Freq: Every day | ORAL | Status: DC
  Administered 2019-08-17 – 2019-08-18 (×3): 10 mg via ORAL
  Filled 2019-08-16 (×3): qty 1

## 2019-08-16 MED ORDER — SODIUM CHLORIDE 0.9 % IV SOLN
INTRAVENOUS | Status: DC | PRN
  Administered 2019-08-16: 500 mL

## 2019-08-16 MED ORDER — AMLODIPINE BESYLATE 10 MG PO TABS
10.0000 mg | ORAL_TABLET | Freq: Every day | ORAL | Status: DC
  Administered 2019-08-17: 10 mg via ORAL
  Filled 2019-08-16 (×2): qty 1

## 2019-08-16 MED ORDER — FENTANYL CITRATE (PF) 100 MCG/2ML IJ SOLN
25.0000 ug | INTRAMUSCULAR | Status: DC | PRN
  Administered 2019-08-16 (×2): 25 ug via INTRAVENOUS

## 2019-08-16 MED ORDER — PROPOFOL 10 MG/ML IV BOLUS
INTRAVENOUS | Status: DC | PRN
  Administered 2019-08-16: 120 mg via INTRAVENOUS
  Administered 2019-08-16: 100 mg via INTRAVENOUS

## 2019-08-16 MED ORDER — HEPARIN SODIUM (PORCINE) 5000 UNIT/ML IJ SOLN
5000.0000 [IU] | Freq: Three times a day (TID) | INTRAMUSCULAR | Status: DC
  Administered 2019-08-18 – 2019-08-19 (×5): 5000 [IU] via SUBCUTANEOUS
  Filled 2019-08-16 (×5): qty 1

## 2019-08-16 MED ORDER — LIDOCAINE 2% (20 MG/ML) 5 ML SYRINGE
INTRAMUSCULAR | Status: AC
  Filled 2019-08-16: qty 5

## 2019-08-16 MED ORDER — CHLORHEXIDINE GLUCONATE CLOTH 2 % EX PADS
6.0000 | MEDICATED_PAD | Freq: Every day | CUTANEOUS | Status: DC
  Administered 2019-08-17 – 2019-08-18 (×2): 6 via TOPICAL

## 2019-08-16 MED ORDER — LORAZEPAM 2 MG/ML IJ SOLN: 0.5000 mg | Freq: Once | INTRAMUSCULAR | Status: DC

## 2019-08-16 MED ORDER — THROMBIN 20000 UNITS EX SOLR
CUTANEOUS | Status: AC
  Filled 2019-08-16: qty 20000

## 2019-08-16 SURGICAL SUPPLY — 101 items
APL SKNCLS STERI-STRIP NONHPOA (GAUZE/BANDAGES/DRESSINGS)
BATTERY IQ STERILE (MISCELLANEOUS) ×1 IMPLANT
BENZOIN TINCTURE PRP APPL 2/3 (GAUZE/BANDAGES/DRESSINGS) IMPLANT
BLADE CLIPPER SURG (BLADE) ×3 IMPLANT
BLADE SAW GIGLI 16 STRL (MISCELLANEOUS) IMPLANT
BLADE SURG 15 STRL LF DISP TIS (BLADE) IMPLANT
BLADE SURG 15 STRL SS (BLADE)
BNDG CMPR 75X41 PLY HI ABS (GAUZE/BANDAGES/DRESSINGS)
BNDG GAUZE ELAST 4 BULKY (GAUZE/BANDAGES/DRESSINGS) IMPLANT
BNDG STRETCH 4X75 STRL LF (GAUZE/BANDAGES/DRESSINGS) IMPLANT
BTRY SRG DRVR 1.5 IQ (MISCELLANEOUS) ×2
BUR ACORN 9.0 PRECISION (BURR) ×3 IMPLANT
BUR ROUND FLUTED 4 SOFT TCH (BURR) IMPLANT
BUR SPIRAL ROUTER 2.3 (BUR) ×3 IMPLANT
CANISTER SUCT 3000ML PPV (MISCELLANEOUS) ×6 IMPLANT
CATH VENTRIC 35X38 W/TROCAR LG (CATHETERS) IMPLANT
CLIP VESOCCLUDE MED 6/CT (CLIP) IMPLANT
CONT SPEC 4OZ CLIKSEAL STRL BL (MISCELLANEOUS) ×3 IMPLANT
COVER MAYO STAND STRL (DRAPES) IMPLANT
COVER WAND RF STERILE (DRAPES) ×3 IMPLANT
DECANTER SPIKE VIAL GLASS SM (MISCELLANEOUS) ×3 IMPLANT
DRAIN SUBARACHNOID (WOUND CARE) IMPLANT
DRAPE HALF SHEET 40X57 (DRAPES) ×3 IMPLANT
DRAPE MICROSCOPE LEICA (MISCELLANEOUS) IMPLANT
DRAPE NEUROLOGICAL W/INCISE (DRAPES) ×3 IMPLANT
DRAPE STERI IOBAN 125X83 (DRAPES) IMPLANT
DRAPE SURG 17X23 STRL (DRAPES) IMPLANT
DRAPE WARM FLUID 44X44 (DRAPES) ×3 IMPLANT
DRSG ADAPTIC 3X8 NADH LF (GAUZE/BANDAGES/DRESSINGS) IMPLANT
DRSG TELFA 3X8 NADH (GAUZE/BANDAGES/DRESSINGS) IMPLANT
DURAPREP 6ML APPLICATOR 50/CS (WOUND CARE) ×3 IMPLANT
ELECT REM PT RETURN 9FT ADLT (ELECTROSURGICAL) ×3
ELECTRODE REM PT RTRN 9FT ADLT (ELECTROSURGICAL) ×2 IMPLANT
EVACUATOR 1/8 PVC DRAIN (DRAIN) IMPLANT
EVACUATOR SILICONE 100CC (DRAIN) IMPLANT
FEE INTRAOP MONITOR IMPULS NCS (MISCELLANEOUS) IMPLANT
FORCEPS BIPOLAR SPETZLER 8 1.0 (NEUROSURGERY SUPPLIES) ×3 IMPLANT
GAUZE 4X4 16PLY RFD (DISPOSABLE) IMPLANT
GAUZE SPONGE 4X4 12PLY STRL (GAUZE/BANDAGES/DRESSINGS) IMPLANT
GLOVE BIO SURGEON STRL SZ7 (GLOVE) IMPLANT
GLOVE BIO SURGEON STRL SZ7.5 (GLOVE) ×3 IMPLANT
GLOVE BIOGEL PI IND STRL 7.0 (GLOVE) IMPLANT
GLOVE BIOGEL PI IND STRL 7.5 (GLOVE) ×2 IMPLANT
GLOVE BIOGEL PI INDICATOR 7.0 (GLOVE)
GLOVE BIOGEL PI INDICATOR 7.5 (GLOVE) ×1
GLOVE EXAM NITRILE LRG STRL (GLOVE) IMPLANT
GLOVE EXAM NITRILE XL STR (GLOVE) IMPLANT
GLOVE EXAM NITRILE XS STR PU (GLOVE) IMPLANT
GOWN STRL REUS W/ TWL LRG LVL3 (GOWN DISPOSABLE) ×4 IMPLANT
GOWN STRL REUS W/ TWL XL LVL3 (GOWN DISPOSABLE) IMPLANT
GOWN STRL REUS W/TWL 2XL LVL3 (GOWN DISPOSABLE) IMPLANT
GOWN STRL REUS W/TWL LRG LVL3 (GOWN DISPOSABLE) ×6
GOWN STRL REUS W/TWL XL LVL3 (GOWN DISPOSABLE)
HEMOSTAT POWDER KIT SURGIFOAM (HEMOSTASIS) ×3 IMPLANT
HEMOSTAT SURGICEL 2X14 (HEMOSTASIS) ×3 IMPLANT
HOOK DURA 1/2IN (MISCELLANEOUS) ×3 IMPLANT
INTRAOP MONITOR FEE IMPULS NCS (MISCELLANEOUS) ×2
INTRAOP MONITOR FEE IMPULSE (MISCELLANEOUS) ×1
IV NS 1000ML (IV SOLUTION) ×3
IV NS 1000ML BAXH (IV SOLUTION) ×2 IMPLANT
KIT BASIN OR (CUSTOM PROCEDURE TRAY) ×3 IMPLANT
KIT DRAIN CSF ACCUDRAIN (MISCELLANEOUS) IMPLANT
KIT TURNOVER KIT B (KITS) ×3 IMPLANT
MARKER SPHERE PSV REFLC 13MM (MARKER) ×6 IMPLANT
NDL SPNL 18GX3.5 QUINCKE PK (NEEDLE) IMPLANT
NEEDLE HYPO 22GX1.5 SAFETY (NEEDLE) ×3 IMPLANT
NEEDLE SPNL 18GX3.5 QUINCKE PK (NEEDLE) IMPLANT
NS IRRIG 1000ML POUR BTL (IV SOLUTION) ×9 IMPLANT
PACK CRANIOTOMY CUSTOM (CUSTOM PROCEDURE TRAY) ×3 IMPLANT
PAD DRESSING TELFA 3X8 NADH (GAUZE/BANDAGES/DRESSINGS) IMPLANT
PATTIES SURGICAL .25X.25 (GAUZE/BANDAGES/DRESSINGS) IMPLANT
PATTIES SURGICAL .5 X.5 (GAUZE/BANDAGES/DRESSINGS) IMPLANT
PATTIES SURGICAL .5 X3 (DISPOSABLE) IMPLANT
PATTIES SURGICAL 1/4 X 3 (GAUZE/BANDAGES/DRESSINGS) IMPLANT
PATTIES SURGICAL 1X1 (DISPOSABLE) IMPLANT
PIN MAYFIELD SKULL DISP (PIN) ×3 IMPLANT
PLATE 1.5/0.3 85X53M SM PANEL (Plate) ×1 IMPLANT
PROBE NERVBE PRASS .33 (MISCELLANEOUS) ×2 IMPLANT
RUBBERBAND STERILE (MISCELLANEOUS) ×2 IMPLANT
SCREW SELF DRILL HT 1.5/4MM (Screw) ×4 IMPLANT
SET CARTRIDGE AND TUBING (SET/KITS/TRAYS/PACK) ×1 IMPLANT
SPECIMEN JAR SMALL (MISCELLANEOUS) ×2 IMPLANT
SPONGE NEURO XRAY DETECT 1X3 (DISPOSABLE) IMPLANT
SPONGE SURGIFOAM ABS GEL 100 (HEMOSTASIS) ×3 IMPLANT
STAPLER VISISTAT 35W (STAPLE) ×3 IMPLANT
SUT ETHILON 3 0 FSL (SUTURE) IMPLANT
SUT ETHILON 3 0 PS 1 (SUTURE) IMPLANT
SUT MNCRL AB 3-0 PS2 18 (SUTURE) ×1 IMPLANT
SUT NURALON 4 0 TR CR/8 (SUTURE) ×8 IMPLANT
SUT SILK 0 TIES 10X30 (SUTURE) IMPLANT
SUT VIC AB 2-0 CP2 18 (SUTURE) ×4 IMPLANT
TIP SHEAR CVD EXTENDED 36KH (INSTRUMENTS) ×1 IMPLANT
TIP STANDARD 36KHZ (INSTRUMENTS) ×3
TIP STD 36KHZ (INSTRUMENTS) IMPLANT
TOWEL GREEN STERILE (TOWEL DISPOSABLE) ×3 IMPLANT
TOWEL GREEN STERILE FF (TOWEL DISPOSABLE) ×3 IMPLANT
TRAY FOLEY MTR SLVR 16FR STAT (SET/KITS/TRAYS/PACK) ×3 IMPLANT
TUBE CONNECTING 12X1/4 (SUCTIONS) ×3 IMPLANT
UNDERPAD 30X30 (UNDERPADS AND DIAPERS) ×3 IMPLANT
WATER STERILE IRR 1000ML POUR (IV SOLUTION) ×3 IMPLANT
WRENCH TORQUE 36KHZ (INSTRUMENTS) ×1 IMPLANT

## 2019-08-16 NOTE — H&P (Signed)
Surgical H&P Update  HPI: 48 y.o. 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 No changes in health since she was last seen. Still having facial paresthesias and wishes to proceed with surgery.  PMHx:  Past Medical History:  Diagnosis Date  . Facial numbness   . Fibroid   . Fx ankle   . Hypertension   . Obesity    FamHx:  Family History  Problem Relation Age of Onset  . Coronary artery disease Mother   . Hypertension Mother   . Coronary artery disease Father   . Kidney disease Father   . Hypertension Father   . Multiple sclerosis Sister   . Hypertension Sister   . Stroke Maternal Grandmother   . Heart attack Maternal Grandfather   . Anesthesia problems Neg Hx   . Hypotension Neg Hx   . Malignant hyperthermia Neg Hx   . Pseudochol deficiency Neg Hx    SocHx:  reports that she has never smoked. She has never used smokeless tobacco. She reports current alcohol use. She reports that she does not use drugs.  Physical Exam: AOx3, PERRL, FS with V2/V3 numbness on the left, TM  Strength 5/5 x4, SILTx4  Assesment/Plan: 48 y.o. woman with left VS, here for surgical resection. Risks, benefits, and alternatives discussed and the patient would like to continue with surgery.  -OR today -4N post-op  Judith Part, MD 08/16/19 9:49 AM

## 2019-08-16 NOTE — Brief Op Note (Signed)
08/16/2019  2:27 PM  PATIENT:  [name]  48 y.o. female  PRE-OPERATIVE DIAGNOSIS:  Left vestibular schwannoma  POST-OPERATIVE DIAGNOSIS:  Left vestibular schwannoma  PROCEDURE:  Procedure(s) with comments: Left craniotomy for tumor resection (Left) - Left craniotomy for tumor resection APPLICATION OF CRANIAL NAVIGATION (N/A)  SURGEON:  Surgeon(s) and Role:    * Ostergard, Joyice Faster, MD - Primary    * Earnie Larsson, MD - Assisting  PHYSICIAN ASSISTANT:   ANESTHESIA:   general  EBL:  450 mL   BLOOD ADMINISTERED:none  DRAINS: none   LOCAL MEDICATIONS USED:  LIDOCAINE   SPECIMEN:  Source of Specimen:  Left CP angle tumor  DISPOSITION OF SPECIMEN:  PATHOLOGY  COUNTS:  YES  TOURNIQUET:  * No tourniquets in log *  DICTATION: .Note written in EPIC  PLAN OF CARE: Admit to inpatient   PATIENT DISPOSITION:  PACU - hemodynamically stable.   Delay start of Pharmacological VTE agent (>24hrs) due to surgical blood loss or risk of bleeding: yes

## 2019-08-16 NOTE — Transfer of Care (Signed)
Immediate Anesthesia Transfer of Care Note  Patient: [name]  Procedure(s) Performed: Left craniotomy for tumor resection (Left ) APPLICATION OF CRANIAL NAVIGATION (N/A )  Patient Location: PACU  Anesthesia Type:General  Level of Consciousness: awake, alert  and oriented  Airway & Oxygen Therapy: Patient Spontanous Breathing  Post-op Assessment: Report given to RN and Post -op Vital signs reviewed and stable  Post vital signs: Reviewed and stable  Last Vitals:  Vitals Value Taken Time  BP    Temp    Pulse 106 08/16/19 1443  Resp 22 08/16/19 1443  SpO2 99 % 08/16/19 1443  Vitals shown include unvalidated device data.  Last Pain:  Vitals:   08/16/19 0805  TempSrc:   PainSc: 0-No pain      Patients Stated Pain Goal: 3 ([id number])  Complications: No apparent anesthesia complications

## 2019-08-16 NOTE — Anesthesia Procedure Notes (Signed)
Arterial Line Insertion Start/End11/12/2018 9:10 AM, 08/16/2019 9:15 AM Performed by: Roberts Gaudy, MD, Griffin Dakin, CRNA, CRNA  Patient location: Pre-op. Preanesthetic checklist: patient identified, IV checked, site marked, risks and benefits discussed, surgical consent, monitors and equipment checked, pre-op evaluation, timeout performed and anesthesia consent Lidocaine 1% used for infiltration Right, radial was placed Catheter size: 20 Fr Hand hygiene performed  and maximum sterile barriers used   Attempts: 1 Procedure performed without using ultrasound guided technique. Following insertion, dressing applied. Post procedure assessment: normal and unchanged  Patient tolerated the procedure well with no immediate complications.

## 2019-08-16 NOTE — Anesthesia Procedure Notes (Signed)
Procedure Name: Intubation Date/Time: 08/16/2019 10:17 AM Performed by: Griffin Dakin, CRNA Pre-anesthesia Checklist: Patient identified, Emergency Drugs available, Suction available and Patient being monitored Patient Re-evaluated:Patient Re-evaluated prior to induction Oxygen Delivery Method: Circle system utilized Preoxygenation: Pre-oxygenation with 100% oxygen Induction Type: IV induction Ventilation: Mask ventilation without difficulty Laryngoscope Size: Mac and 4 Grade View: Grade II Tube type: Oral Tube size: 7.5 mm Number of attempts: 1 Airway Equipment and Method: Stylet and Oral airway Placement Confirmation: ETT inserted through vocal cords under direct vision,  positive ETCO2 and breath sounds checked- equal and bilateral Secured at: 23 cm Tube secured with: Tape Dental Injury: Teeth and Oropharynx as per pre-operative assessment

## 2019-08-16 NOTE — Progress Notes (Signed)
11/3 22:20 called RN for update on patient. RN stated patient has been having episodes of emesis and was feeling unwell. RN stated exam can wait till late tonight or early morning (11/4). RN will CB when patient is available for exam.

## 2019-08-16 NOTE — Anesthesia Postprocedure Evaluation (Signed)
Anesthesia Post Note  Patient: [name]  Procedure(s) Performed: Left craniotomy for tumor resection (Left ) APPLICATION OF CRANIAL NAVIGATION (N/A )     Patient location during evaluation: PACU Anesthesia Type: General Level of consciousness: awake and alert Pain management: pain level controlled Vital Signs Assessment: post-procedure vital signs reviewed and stable Respiratory status: spontaneous breathing, nonlabored ventilation, respiratory function stable and patient connected to nasal cannula oxygen Cardiovascular status: blood pressure returned to baseline and stable Postop Assessment: no apparent nausea or vomiting Anesthetic complications: no    Last Vitals:  Vitals:   08/16/19 1633 08/16/19 1637  BP: (!) 140/92   Pulse: (!) 101   Resp: (!) 22 (!) 22  Temp:    SpO2: 100%     Last Pain:  Vitals:   08/16/19 1500  TempSrc:   PainSc: 0-No pain                 Emrys Mceachron COKER

## 2019-08-16 NOTE — Progress Notes (Signed)
Neurosurgery Service Post-operative progress note  Assessment & Plan: 48 y.o. woman s/p rsxn of L VS. Seen in PACU, FCx4, face symmetric w/ symmetric sensation.  -admit to 4N -MRI w/wo contrast ordered, can be performed on 11/3 or 11/4  Judith Part  08/16/19 2:46 PM

## 2019-08-16 NOTE — Op Note (Signed)
PATIENT: [name]  DAY OF SURGERY: 08/16/19   PRE-OPERATIVE DIAGNOSIS:  Left vestibular schwannoma   POST-OPERATIVE DIAGNOSIS:  Left vestibular schwannoma   PROCEDURE:  Left retrosigmoid craniotomy for resection of cerebellopontine angle mass   SURGEON:  Surgeon(s) and Role:    Judith Part, MD - Primary    Earnie Larsson, MD - Assisting   ANESTHESIA: ETGA   BRIEF HISTORY: This is a 48 year old woman who presented with left sided facial numbness and paresthesias. The patient was found to have a left CP angle mass. I extensively discussed treatment options and their risks/benefits with the patient and her family. They elected to proceed with surgical resection. We discussed risks, benefits, and alternatives and the patient wished to proceed.   OPERATIVE DETAIL: The patient was taken to the operating room and placed on the OR table in the supine position. A formal time out was performed with two patient identifiers and confirmed the operative site. Anesthesia was induced by the anesthesia team. The [place] head holder was applied to the head and a registration array was attached to the [place]. This was co-registered with the patient's preoperative imaging, the fit appeared to be acceptable. Using frameless stereotaxy, the operative trajectory was planned and the incision was marked. Hair was clipped with surgical clippers over the incision and the area was then prepped and draped in a sterile fashion. Facial nerve monitoring leads were placed by the monitoring team.  A curvilinear incision was placed two finger breadths posterior to the left ear. A standard left retrosigmoid craniectomy was performed. The transverse-sigmoid junction was exposed and confirmed with stereotaxy to maximize the operative corridor. The dura was opened and retracted with sutures and the cistern was opened to drain CSF for brain relaxation. After this was completed and adequate relaxation was achieved such that a  retractor wasn't required.   The cistern was entered and the tumor, brainstem, and cranial nerves were identified. The facial nerve was identified at the inferior pole of the tumor as it entered the brainstem and stimulated to confirm location and establish baseline stimulation parameters. The tumor capsule was stimulated to confirm a lack of response then coagulated and incised sharply. It was then internally debulked to allow for safe mobilization. The facial nerve was then identified as it entered the IAC and stimulated. The tumor capsule was mobilized off of the facial nerve and then brain stem. The course of the facial nerve was followed and tumor was removed from it using the stimulator probe. The only remaining attachment was along the tentorium. Dandy's vein was identified, coagulated, and divided. The tumor was then removed off the tentorium using bipolar cautery. There was no obvious residual remaining.  With resection complete, the facial nerve was stimulated to confirm function and the pre-resection stimulation parameters were stable. The surgical bed was copiously irrigated and hemostasis was confirmed. A piece of titanium mesh was molded and cut to cover the craniectomy defect and secured with titanium screws. The soft tissues were then closed in layers in the usual fashion and the patient was returned to anesthesia for emergence. All counts were correct x2.  EBL:  426mL   DRAINS: none   SPECIMENS: left CP angle tumor   Judith Part, MD 08/16/19 9:55 AM

## 2019-08-16 NOTE — Progress Notes (Signed)
Patient to 4N15 at 1652, a/ox4 on arrival with complete neuro assessment as charted. Patient with n/v, emesis x1. Belongings at bedside are: one sweater, one pair pants, one bra, one pair underwear, one pair socks, one pair shoes, one [employer] hat, and one pair glasses.  Candy Sledge, RN

## 2019-08-17 ENCOUNTER — Encounter (HOSPITAL_COMMUNITY): Payer: Self-pay | Admitting: Neurological Surgery

## 2019-08-17 LAB — SURGICAL PATHOLOGY

## 2019-08-17 MED ORDER — PRO-STAT SUGAR FREE PO LIQD
30.0000 mL | Freq: Two times a day (BID) | ORAL | Status: DC
  Administered 2019-08-18: 30 mL via ORAL
  Filled 2019-08-17 (×2): qty 30

## 2019-08-17 MED ORDER — BOOST / RESOURCE BREEZE PO LIQD CUSTOM
1.0000 | Freq: Three times a day (TID) | ORAL | Status: DC
  Administered 2019-08-17: 1 via ORAL

## 2019-08-17 MED ORDER — SODIUM CHLORIDE 0.9 % IV SOLN
INTRAVENOUS | Status: DC
  Administered 2019-08-17: 12:00:00 via INTRAVENOUS

## 2019-08-17 MED ORDER — MECLIZINE HCL 25 MG PO TABS
25.0000 mg | ORAL_TABLET | Freq: Three times a day (TID) | ORAL | Status: DC | PRN
  Administered 2019-08-18: 25 mg via ORAL
  Filled 2019-08-17 (×2): qty 1

## 2019-08-17 MED ORDER — POTASSIUM CHLORIDE 20 MEQ PO PACK
40.0000 meq | PACK | Freq: Once | ORAL | Status: AC
  Administered 2019-08-17: 40 meq via ORAL
  Filled 2019-08-17: qty 2

## 2019-08-17 NOTE — Progress Notes (Signed)
Initial Nutrition Assessment  DOCUMENTATION CODES:   Morbid obesity  INTERVENTION:  -Boost Breeze po TID, each supplement provides 250 kcal and 9 grams of protein -Prostat 30 ml po BID, each supplement provides 100 kcal and 15 grams of protein -Advance diet as tolerated  NUTRITION DIAGNOSIS:   Increased nutrient needs related to wound healing(L retrosigmoid craniotomy) as evidenced by estimated needs.   GOAL:   Patient will meet greater than or equal to 90% of their needs   MONITOR:   PO intake, Supplement acceptance, Diet advancement, I & O's, Labs, Weight trends, Skin  REASON FOR ASSESSMENT:   Malnutrition Screening Tool    ASSESSMENT:  RD working remotely.  48 year old woman with past medical history significant of morbid obesity, HTN, GERD, left sided facial numbness and paresthesias with cerebellopontine angle mass who presented for left retrosigmoid craniotomy.  11/3- resection of L Vestibular Schwannoma  Per chart review, patient reports dizziness/vertiginous symptoms this morning. Plans for MRI today, continuing IVF until nausea is improved enough to tolerate diet. Currently patient on CL, no recorded intake at this time. Will provide Boost Breeze as well as Prostat to aid with calorie/protien needs and continue to monitor for diet advancement.   I/Os: -349 ml since admit UOP: 1550 ml x 24 hrs Weight history reviewed: 122.9 kg-127.8 kg over the past year Medications reviewed and include: Hydrocodone, Dilaudid, Zofran, Phenegran Labs: reviewed  NUTRITION - FOCUSED PHYSICAL EXAM: Unable to complete at this time, RD working remotely.  Diet Order:   Diet Order            Diet clear liquid Room service appropriate? Yes; Fluid consistency: Thin  Diet effective now              EDUCATION NEEDS:   No education needs have been identified at this time  Skin:  Skin Assessment: Reviewed RN Assessment(incision;closed; left;head)  Last BM:  PTA  Height:    Ht Readings from Last 1 Encounters:  08/16/19 5\' 3"  (1.6 m)    Weight:   Wt Readings from Last 1 Encounters:  08/16/19 126.1 kg    Ideal Body Weight:  52.3 kg  BMI:  Body mass index is 49.25 kg/m.  Estimated Nutritional Needs:   Kcal:  2050-2250  Protein:  105-115  Fluid:  >/= 2 L/day   Lajuan Lines, RD, LDN Clinical Nutrition Office [phone] After Hours/Weekend Pager: [phone]

## 2019-08-17 NOTE — Progress Notes (Addendum)
Neurosurgery Service Progress Note  Subjective: No acute events overnight, having some dizziness / vertiginous symptoms   Objective: Vitals:   08/17/19 0500 08/17/19 0600 08/17/19 0700 08/17/19 0800  BP: (!) 147/85 130/87 127/84 130/87  Pulse: (!) 118 (!) 121 (!) 112 (!) 108  Resp: 18 (!) 23 19 18   Temp:    98.9 F (37.2 C)  TempSrc:    Axillary  SpO2: 98% 100% 99% 100%  Weight:      Height:       Temp (24hrs), Avg:98.4 F (36.9 C), Min:97.8 F (36.6 C), Max:99.1 F (37.3 C)  CBC Latest Ref Rng & Units 08/16/2019 08/16/2019 08/16/2019  WBC 4.0 - 10.5 K/uL 12.5(H) - -  Hemoglobin 12.0 - 15.0 g/dL 13.1 12.9 11.9(L)  Hematocrit 36.0 - 46.0 % 39.5 38.0 35.0(L)  Platelets 150 - 400 K/uL 240 - -   BMP Latest Ref Rng & Units 08/16/2019 08/16/2019 08/16/2019  Glucose 70 - 99 mg/dL - 116(H) -  BUN 6 - 20 mg/dL - 19 -  Creatinine 0.44 - 1.00 mg/dL 0.78 0.70 -  BUN/Creat Ratio 6 - 22 (calc) - - -  Sodium 135 - 145 mmol/L - 139 139  Potassium 3.5 - 5.1 mmol/L - 3.3(L) 3.2(L)  Chloride 98 - 111 mmol/L - 105 -  CO2 22 - 32 mmol/L - - -  Calcium 8.9 - 10.3 mg/dL - - -    Intake/Output Summary (Last 24 hours) at 08/17/2019 0932 Last data filed at 08/17/2019 0800 Gross per 24 hour  Intake 1650.06 ml  Output 2145 ml  Net -494.94 ml    Current Facility-Administered Medications:  .  acetaminophen (TYLENOL) tablet 650 mg, 650 mg, Oral, Q4H PRN **OR** acetaminophen (TYLENOL) suppository 650 mg, 650 mg, Rectal, Q4H PRN, Judith Part, MD .  amLODipine (NORVASC) tablet 10 mg, 10 mg, Oral, Daily, Karem Farha A, MD .  Chlorhexidine Gluconate Cloth 2 % PADS 6 each, 6 each, Topical, Daily, Cydnie Deason A, MD .  cloNIDine (CATAPRES) tablet 0.3 mg, 0.3 mg, Oral, Daily, Delayna Sparlin A, MD .  docusate sodium (COLACE) capsule 100 mg, 100 mg, Oral, BID, Trevion Hoben A, MD .  famotidine (PEPCID) tablet 20 mg, 20 mg, Oral, Daily, Mahdiya Mossberg, Joyice Faster, MD .  Derrill Memo ON 08/18/2019]  heparin injection 5,000 Units, 5,000 Units, Subcutaneous, Q8H, Kasondra Junod A, MD .  HYDROcodone-acetaminophen (NORCO/VICODIN) 5-325 MG per tablet 1 tablet, 1 tablet, Oral, Q4H PRN, Judith Part, MD, 1 tablet at 08/17/19 0839 .  HYDROmorphone (DILAUDID) injection 0.5 mg, 0.5 mg, Intravenous, Q3H PRN, Judith Part, MD, 0.5 mg at 08/17/19 0641 .  labetalol (NORMODYNE) injection 10-40 mg, 10-40 mg, Intravenous, Q10 min PRN, Julieann Drummonds A, MD .  labetalol (NORMODYNE) tablet 200 mg, 200 mg, Oral, Daily, Naphtali Zywicki A, MD .  lisinopril (ZESTRIL) tablet 10 mg, 10 mg, Oral, QHS, Areil Ottey, Joyice Faster, MD, 10 mg at 08/17/19 0005 .  LORazepam (ATIVAN) injection 0.5 mg, 0.5 mg, Intravenous, Once, Bergman, Meghan D, NP .  ondansetron (ZOFRAN) tablet 4 mg, 4 mg, Oral, Q4H PRN **OR** ondansetron (ZOFRAN) injection 4 mg, 4 mg, Intravenous, Q4H PRN, Judith Part, MD, 4 mg at 08/17/19 0641 .  polyethylene glycol (MIRALAX / GLYCOLAX) packet 17 g, 17 g, Oral, Daily PRN, Judith Part, MD .  promethazine (PHENERGAN) injection 12.5-25 mg, 12.5-25 mg, Intravenous, Q4H PRN, 25 mg at 08/17/19 0352 **OR** promethazine (PHENERGAN) tablet 12.5-25 mg, 12.5-25 mg, Oral, Q4H PRN, Judith Part,  MD .  triamterene-hydrochlorothiazide (MAXZIDE) 75-50 MG per tablet 1 tablet, 1 tablet, Oral, Daily, Zenon Leaf, Joyice Faster, MD   Physical Exam: AOx3, PERRL, EOMI, FS with some mild L V2/3 numbness, Strength 5/5 x4, SILTx4, no drift  Assessment & Plan: 48 y.o. woman s/p retrosig rsxn of L VS, facial N function intact post-op.  -MRI when able today -will add meclizine for vertiginous sx -IVF until her nausea is improved enough to tolerate a diet -d/c foley -xfer to stepdown -SCDs/TEDs, SQH tmrw  Judith Part  08/17/19 9:32 AM

## 2019-08-17 NOTE — Evaluation (Signed)
Occupational Therapy Evaluation Patient Details Name: [name] MRN: [id number] DOB: August 27, 1971 Today's Date: 08/17/2019    History of Present Illness 48 y.o. 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 Admitted 08/16/19 for craniotomy and tumor resection PMH-HTN, obesity   Clinical Impression   This 48 y/o female presents with the above. PTA pt independent with ADL, iADL and functional mobility. Pt currently presenting with dizziness/nausea with activity, impaired balance impacting her functional performance. Pt currently requiring minA (+2 safety today) for functional mobility using HHA. Gaze stabilization techniques utilized with mobility and functional tasks to decrease onset of dizziness/nause with pt verbalizing improvements, and with good carryover of strategy throughout session. She currently requires minA for standing grooming and toileting ADL, modA for LB ADL. Pt will benefit from continued acute OT services and recommend follow up outpt OT services after discharge to further address her current balance/visual deficits. Will follow.    Follow Up Recommendations  Outpatient OT(neuro outpt, pending progress)    Equipment Recommendations  None recommended by OT           Precautions / Restrictions Precautions Precautions: Fall Restrictions Weight Bearing Restrictions: No      Mobility Bed Mobility Overal bed mobility: Needs Assistance Bed Mobility: Supine to Sit     Supine to sit: Min guard;HOB elevated;+2 for safety/equipment     General bed mobility comments: to EOB (ICU) therefore closeguarding; pt educated to fix her eyes on a target as moving up to sitting and scooting to EOB and reported this did help with the nausea and vertigo  Transfers Overall transfer level: Needs assistance Equipment used: 1 person hand held assist Transfers: Sit to/from Stand Sit  to Stand: Min assist         General transfer comment: from bed; from toilet stagger step with assist for balance; pt uses wide BOS    Balance Overall balance assessment: Needs assistance Sitting-balance support: No upper extremity supported;Feet unsupported Sitting balance-Leahy Scale: Fair     Standing balance support: No upper extremity supported;During functional activity Standing balance-Leahy Scale: Poor Standing balance comment: static standing with occasional posterior imbalance                           ADL either performed or assessed with clinical judgement   ADL Overall ADL's : Needs assistance/impaired Eating/Feeding: Modified independent;Sitting   Grooming: Wash/dry hands;Oral care;Minimal assistance;Standing Grooming Details (indicate cue type and reason): assist for standing balance, setup assist for oral care to decrease visual stimulation during ADL task Upper Body Bathing: Sitting;Min guard   Lower Body Bathing: Minimal assistance;Sit to/from stand   Upper Body Dressing : Set up;Min guard;Sitting   Lower Body Dressing: Moderate assistance;Sit to/from stand   Toilet Transfer: Minimal assistance;+2 for safety/equipment;Ambulation;Regular Toilet;Grab bars   Toileting- Clothing Manipulation and Hygiene: Minimal assistance;Sit to/from stand;Sitting/lateral lean Toileting - Clothing Manipulation Details (indicate cue type and reason): minA for balance in standing while pt performs pericare     Functional mobility during ADLs: Minimal assistance;+2 for safety/equipment General ADL Comments: pt with some dizziness while upright,      Vision         Perception     Praxis      Pertinent Vitals/Pain Pain Assessment: 0-10 Pain Score: 5  Pain Location: head Pain Descriptors / Indicators: Headache Pain Intervention(s): Limited activity within patient's tolerance;Monitored during session;Repositioned  Hand Dominance     Extremity/Trunk  Assessment Upper Extremity Assessment Upper Extremity Assessment: Overall WFL for tasks assessed   Lower Extremity Assessment Lower Extremity Assessment: Defer to PT evaluation   Cervical / Trunk Assessment Cervical / Trunk Assessment: Other exceptions Cervical / Trunk Exceptions: obese   Communication Communication Communication: No difficulties   Cognition Arousal/Alertness: Awake/alert Behavior During Therapy: WFL for tasks assessed/performed Overall Cognitive Status: Within Functional Limits for tasks assessed                                 General Comments: very pleasant   General Comments       Exercises     Shoulder Instructions      Home Living Family/patient expects to be discharged to:: Private residence Living Arrangements: Parent;Children(mom, 7 and 63 yo plus older son taking timeoff to help) Available Help at Discharge: Family;Available 24 hours/day Type of Home: House Home Access: Stairs to enter [employer] of Steps: 1 Entrance Stairs-Rails: None Home Layout: One level     Bathroom Shower/Tub: Occupational psychologist: Handicapped height Bathroom Accessibility: Yes   Home Equipment: Environmental consultant - 2 wheels;Grab bars - tub/shower;Wheelchair - manual;Grab bars - toilet;Shower seat - built in(w/c in Marine scientist)          Prior Functioning/Environment Level of Independence: Independent        Comments: manages a restaurant in [place]        OT Problem List: Impaired balance (sitting and/or standing);Impaired vision/perception;Decreased knowledge of use of DME or AE;Obesity;Decreased activity tolerance      OT Treatment/Interventions: Self-care/ADL training;Therapeutic exercise;Neuromuscular education;DME and/or AE instruction;Therapeutic activities;Visual/perceptual remediation/compensation;Patient/family education;Balance training    OT Goals(Current goals can be found in the care plan section) Acute Rehab OT  Goals Patient Stated Goal: regain independence OT Goal Formulation: With patient Time For Goal Achievement: 08/31/19 Potential to Achieve Goals: Good  OT Frequency: Min 2X/week   Barriers to D/C:            Co-evaluation PT/OT/SLP Co-Evaluation/Treatment: Yes Reason for Co-Treatment: For patient/therapist safety;To address functional/ADL transfers   OT goals addressed during session: ADL's and self-care      AM-PAC OT "6 Clicks" Daily Activity     Outcome Measure Help from another person eating meals?: None Help from another person taking care of personal grooming?: A Little(in standing) Help from another person toileting, which includes using toliet, bedpan, or urinal?: A Little Help from another person bathing (including washing, rinsing, drying)?: A Little Help from another person to put on and taking off regular upper body clothing?: None Help from another person to put on and taking off regular lower body clothing?: A Lot 6 Click Score: 19   End of Session Equipment Utilized During Treatment: Gait belt Nurse Communication: Mobility status  Activity Tolerance: Patient tolerated treatment well Patient left: in chair;with call bell/phone within reach;with family/visitor present  OT Visit Diagnosis: Unsteadiness on feet (R26.81);Dizziness and giddiness (R42)                Time: 1202-1229 OT Time Calculation (min): 27 min Charges:  OT General Charges $OT Visit: 1 Visit OT Evaluation $OT Eval Moderate Complexity: Pope, OT [employer] Pager [phone] Office [place] 08/17/2019, 4:45 PM

## 2019-08-17 NOTE — Evaluation (Signed)
Physical Therapy Evaluation Patient Details Name: [name] MRN: [id number] DOB: July 25, 1971 Today's Date: 08/17/2019   History of Present Illness  48 y.o. 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 Admitted 08/16/19 for craniotomy and tumor resection PMH-HTN, obesity  Clinical Impression   Patient is s/p above surgery resulting in functional limitations due to the deficits listed below (see PT Problem List). Patient is experiencing vertigo and N/V (pre-medicated for nausea prior to evaluation). She responded well to instructions for gaze fixation to reduce vertigo and was able to walk 90 ft with min assist. She did have staggering losses of balance and will likely need to use her RW on discharge. Patient will benefit from skilled PT to increase their independence and safety with mobility to allow discharge to the venue listed below.       Follow Up Recommendations Outpatient PT(Vestibular rehab)    Equipment Recommendations  None recommended by PT    Recommendations for Other Services       Precautions / Restrictions Precautions Precautions: Fall      Mobility  Bed Mobility Overal bed mobility: Needs Assistance Bed Mobility: Supine to Sit     Supine to sit: Min guard;HOB elevated;+2 for safety/equipment     General bed mobility comments: to EOB (ICU) therefore closeguarding; pt educated to fix her eyes on a target as moving up to sitting and scooting to EOB and reported this did help with the nausea and vertigo  Transfers Overall transfer level: Needs assistance Equipment used: 1 person hand held assist Transfers: Sit to/from Stand Sit to Stand: Min assist         General transfer comment: from bed; from toilet stagger step with assist for balance; pt uses wide BOS  Ambulation/Gait Ambulation/Gait assistance: Min assist Gait Distance (Feet): 90 Feet Assistive  device: 1 person hand held assist Gait Pattern/deviations: Step-through pattern;Decreased stride length;Wide base of support Gait velocity: decr   General Gait Details: occasional imbalance, stagger step posteriorly with min assist to recover  Stairs            Wheelchair Mobility    Modified Rankin (Stroke Patients Only)       Balance Overall balance assessment: Needs assistance Sitting-balance support: No upper extremity supported;Feet unsupported Sitting balance-Leahy Scale: Fair     Standing balance support: No upper extremity supported;During functional activity Standing balance-Leahy Scale: Poor Standing balance comment: static standing with occasional posterior imbalance                             Pertinent Vitals/Pain Pain Assessment: 0-10 Pain Score: 5  Pain Location: head Pain Descriptors / Indicators: Headache Pain Intervention(s): Limited activity within patient's tolerance;Monitored during session;Repositioned    Home Living Family/patient expects to be discharged to:: Private residence Living Arrangements: Parent;Children(mom, 7 and 20 yo plus older son taking timeoff to help) Available Help at Discharge: Family;Available 24 hours/day Type of Home: House Home Access: Stairs to enter Entrance Stairs-Rails: None Entrance Stairs-Number of Steps: 1 Home Layout: One level Home Equipment: Walker - 2 wheels;Shower seat;Grab bars - tub/shower;Wheelchair - manual;Grab bars - toilet(w/c in disrepair)      Prior Function Level of Independence: Independent         Comments: manages a restaurant in [employer]        Extremity/Trunk Assessment   Upper Extremity  Assessment Upper Extremity Assessment: Defer to OT evaluation    Lower Extremity Assessment Lower Extremity Assessment: Overall WFL for tasks assessed    Cervical / Trunk Assessment Cervical / Trunk Assessment: Other exceptions Cervical / Trunk  Exceptions: obese  Communication   Communication: No difficulties  Cognition Arousal/Alertness: Awake/alert Behavior During Therapy: WFL for tasks assessed/performed Overall Cognitive Status: Within Functional Limits for tasks assessed                                        General Comments General comments (skin integrity, edema, etc.): Educated in visual fixation as turning with ambulation and adjusting her focus point further down the hall as she progressed towards her initial focus point    Exercises     Assessment/Plan    PT Assessment Patient needs continued PT services  PT Problem List Decreased balance;Decreased mobility;Decreased knowledge of use of DME;Obesity       PT Treatment Interventions DME instruction;Gait training;Functional mobility training;Therapeutic activities;Balance training;Neuromuscular re-education;Patient/family education    PT Goals (Current goals can be found in the Care Plan section)  Acute Rehab PT Goals Patient Stated Goal: regain independence PT Goal Formulation: With patient Time For Goal Achievement: 08/31/19 Potential to Achieve Goals: Good    Frequency Min 4X/week   Barriers to discharge        Co-evaluation PT/OT/SLP Co-Evaluation/Treatment: Yes Reason for Co-Treatment: For patient/therapist safety;To address functional/ADL transfers PT goals addressed during session: Mobility/safety with mobility;Balance         AM-PAC PT "6 Clicks" Mobility  Outcome Measure Help needed turning from your back to your side while in a flat bed without using bedrails?: None Help needed moving from lying on your back to sitting on the side of a flat bed without using bedrails?: A Little Help needed moving to and from a bed to a chair (including a wheelchair)?: A Little Help needed standing up from a chair using your arms (e.g., wheelchair or bedside chair)?: A Little Help needed to walk in hospital room?: A Little Help needed  climbing 3-5 steps with a railing? : A Little 6 Click Score: 19    End of Session Equipment Utilized During Treatment: Gait belt Activity Tolerance: Patient tolerated treatment well Patient left: in chair;with call bell/phone within reach;with family/visitor present(RN denied need for chair alarm) Nurse Communication: Mobility status PT Visit Diagnosis: Unsteadiness on feet (R26.81);Other symptoms and signs involving the nervous system (R29.898);Dizziness and giddiness (R42)    Time: [id number] PT Time Calculation (min) (ACUTE ONLY): 43 min   Charges:   PT Evaluation $PT Eval Low Complexity: 1 Low PT Treatments $Gait Training: 8-22 mins         Barry Brunner, PT Pager [phone]   Rexanne Mano 08/17/2019, 3:18 PM

## 2019-08-18 ENCOUNTER — Inpatient Hospital Stay (HOSPITAL_COMMUNITY): Payer: BC Managed Care – PPO

## 2019-08-18 LAB — MRSA PCR SCREENING: MRSA by PCR: NEGATIVE

## 2019-08-18 IMAGING — MR MR HEAD WO/W CM
16 of 19 series · 41 of 48 positions shown · IV contrast (gadavist)
Comparison: Preoperative MRI [DATE] and earlier.

CLINICAL DATA: 48-year-old female postop day 2 status post
resection of left cerebellopontine angle mass.

EXAM:
MRI HEAD WITHOUT AND WITH CONTRAST
TECHNIQUE: Multiplanar, multiecho pulse sequences of the brain and surrounding
structures were obtained without and with intravenous contrast.
CONTRAST:  10mL GADAVIST GADOBUTROL 1 MMOL/ML IV SOLN

[Series 5: DWI · axial · 3.0mm · 0.88mm/px · z∈[-157,+3]mm · 8 of 110 slices shown (1 of 4)]
[im 1/110]
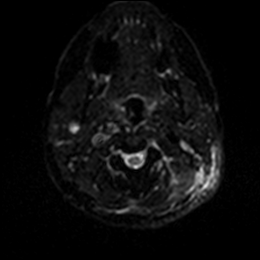
[im 16/110]
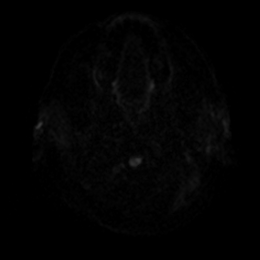
[im 32/110]
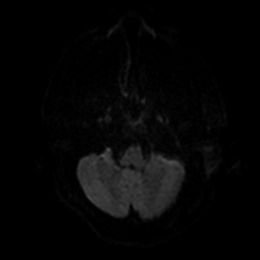
[im 47/110]
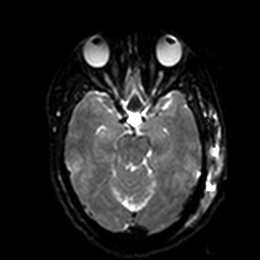
[im 63/110]
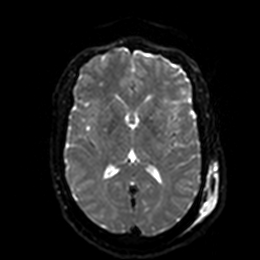
[im 78/110]
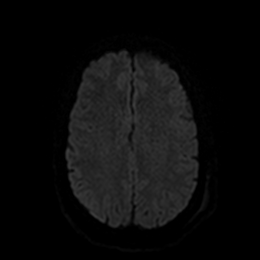
[im 94/110]
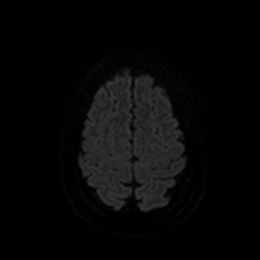
[im 110/110]
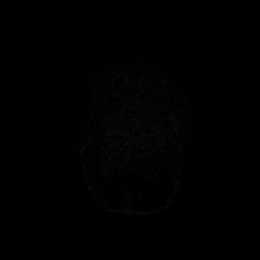

[Series 6: DWI · axial · 3.0mm · 0.88mm/px · z∈[-157,+3]mm · 3 of 55 slices shown (2 of 4)]
[im 1/55]
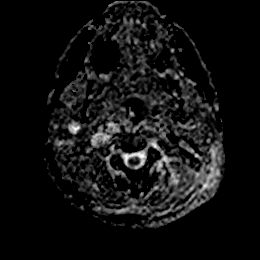
[im 28/55]
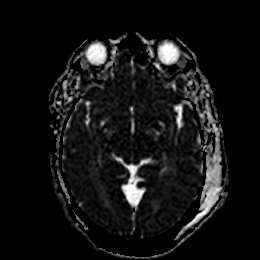
[im 55/55]
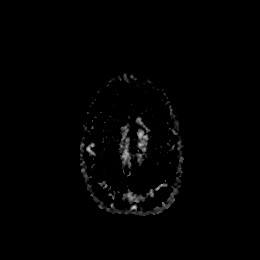

[Series 7: FLAIR · axial · 5.0mm · 0.45mm/px · z∈[-151,+3]mm · 2 of 27 slices shown]
[im 1/27]
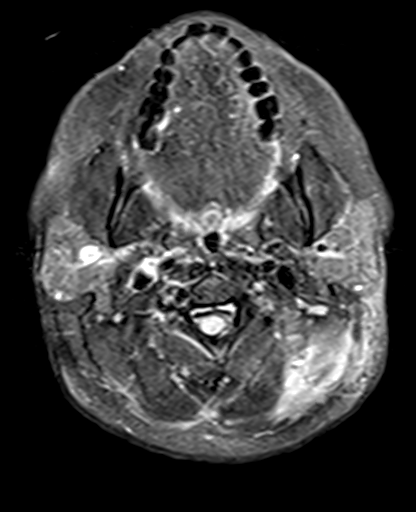
[im 27/27]
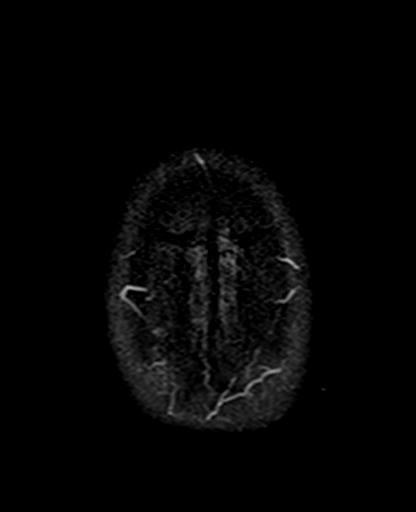

[Series 9: pha_images · axial · 3.0mm · 0.90mm/px · z∈[-159,+4]mm · 3 of 56 slices shown]
[im 1/56]
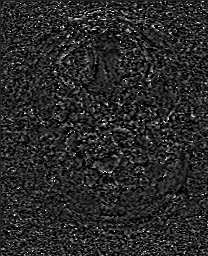
[im 28/56]
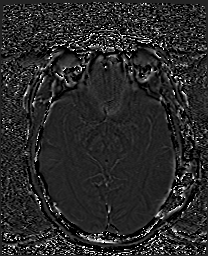
[im 56/56]
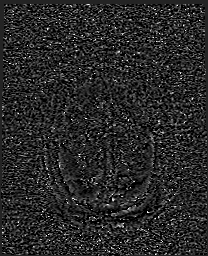

[Series 10: swi_images · axial · 3.0mm · 0.90mm/px · z∈[-159,+4]mm · 3 of 56 slices shown]
[im 1/56]
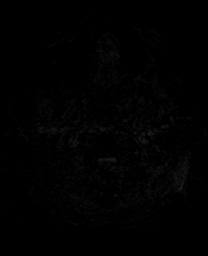
[im 28/56]
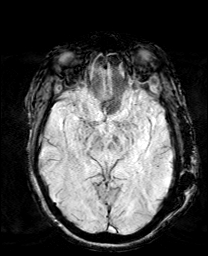
[im 56/56]
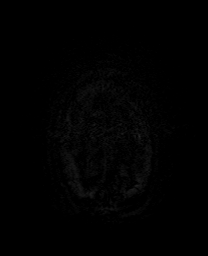

[Series 12: T2 · axial · 5.0mm · 0.72mm/px · z∈[-152,+3]mm · 2 of 27 slices shown]
[im 1/27]
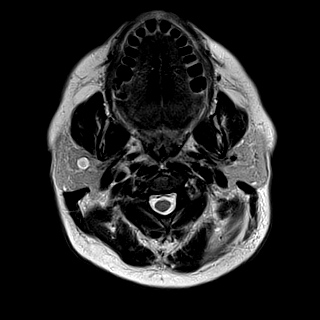
[im 27/27]
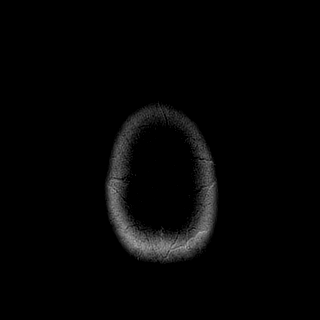

[Series 13: DWI · coronal · 4.0mm · 0.88mm/px · 4 of 68 slices shown (3 of 4)]
[im 1/68]
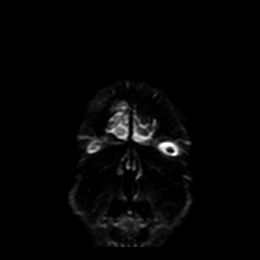
[im 23/68]
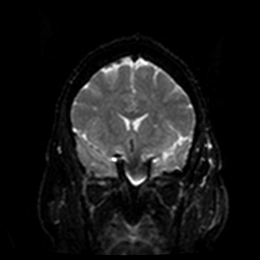
[im 45/68]
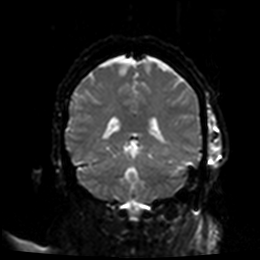
[im 68/68]
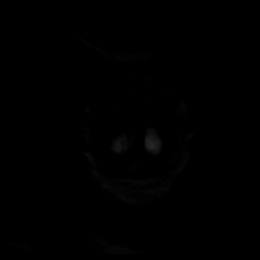

[Series 14: DWI · coronal · 4.0mm · 0.88mm/px · 2 of 34 slices shown (4 of 4)]
[im 1/34]
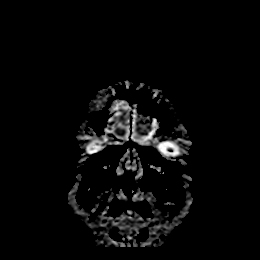
[im 34/34]
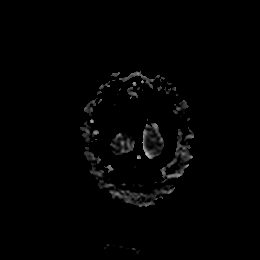

[Series 15: T1 · sagittal · 5.0mm · 0.75mm/px · 1 of 23 slices shown]
[im 1/23]
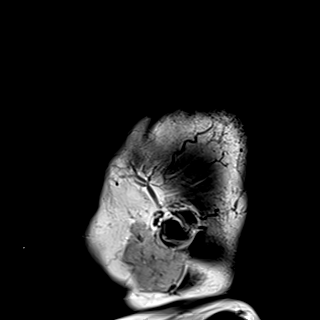

[Series 17: t2_(id)_tra_iso · axial · 0.6mm · 0.56mm/px · z∈[-133,-87]mm · 5 of 80 slices shown]
[im 1/80]
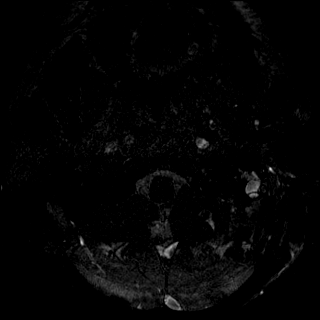
[im 20/80]
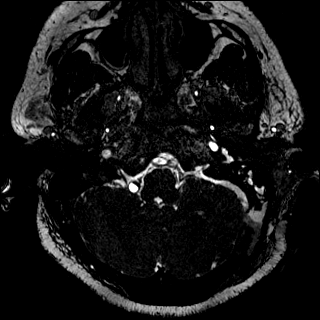
[im 40/80]
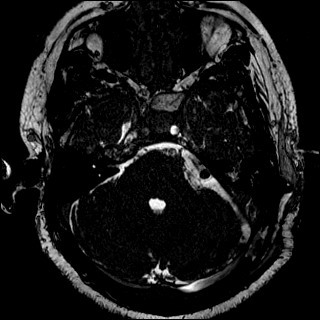
[im 60/80]
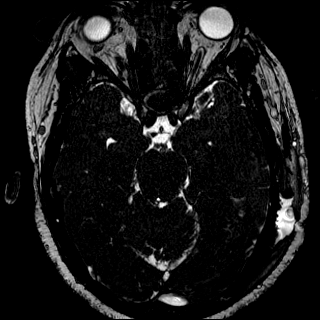
[im 80/80]
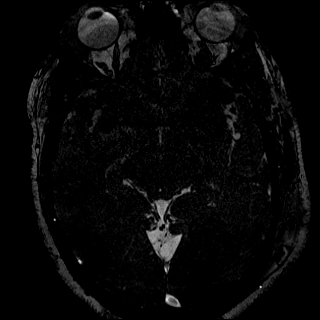

[Series 18: t1_tse_tra_3mm · axial · 3.0mm · 0.31mm/px · 1 of 15 slices shown]
[im 1/15]
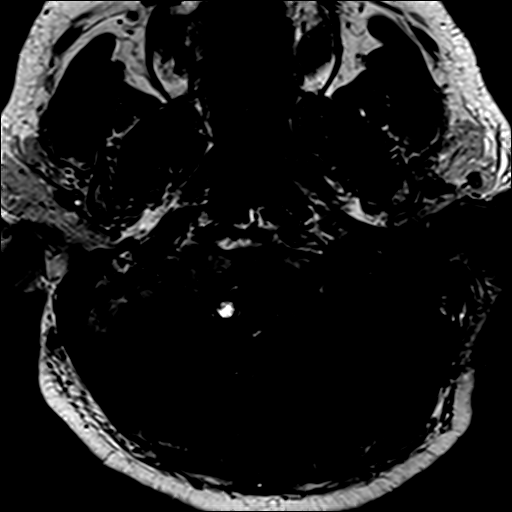

[Series 19: t1_tse_r_cor_3mm · coronal · 3.0mm · 0.33mm/px · 1 of 16 slices shown (1 of 2)]
[im 1/16]
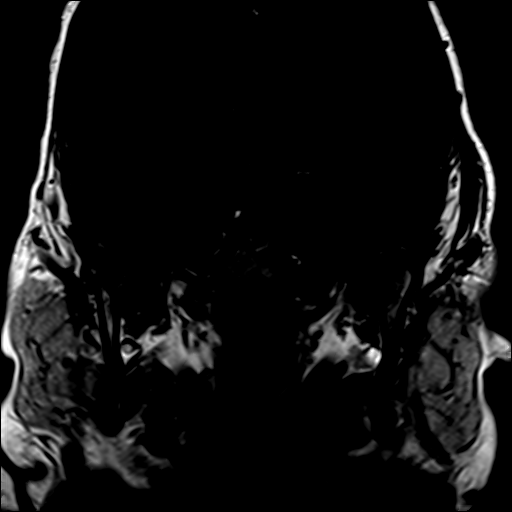

[Series 20: T2 post-contrast · coronal · 5.0mm · 0.72mm/px · 2 of 30 slices shown]
[im 1/30]
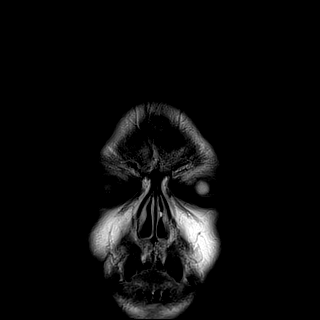
[im 30/30]
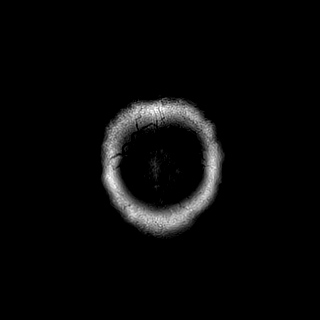

[Series 22: t1_tse_r_cor_3mm · coronal · 3.0mm · 0.33mm/px · 1 of 16 slices shown (2 of 2)]
[im 1/16]
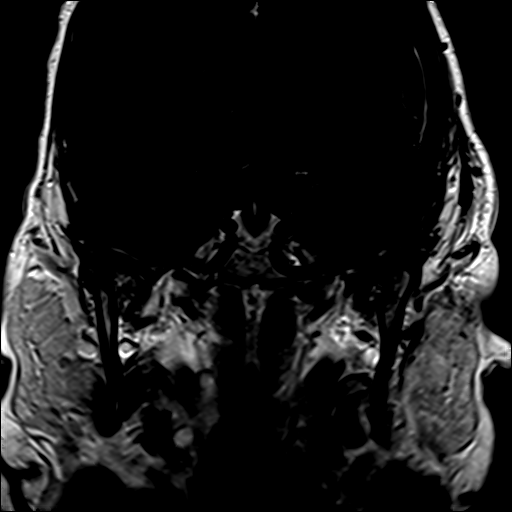

[Series 24: T1 post-contrast · coronal · 5.0mm · 0.34mm/px · 2 of 30 slices shown (1 of 2)]
[im 1/30]
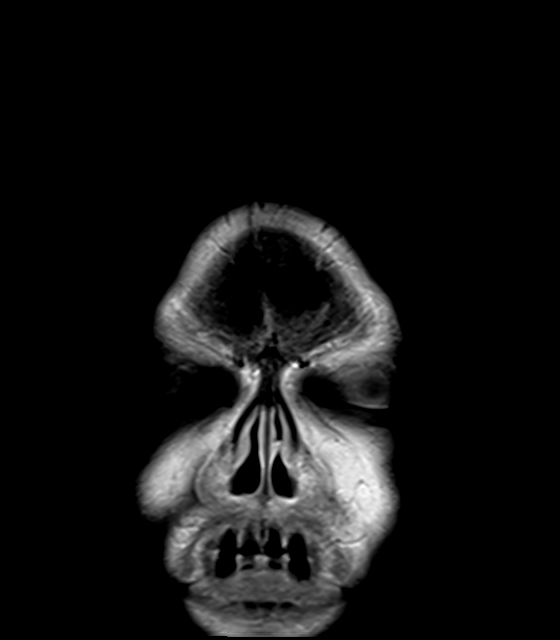
[im 30/30]
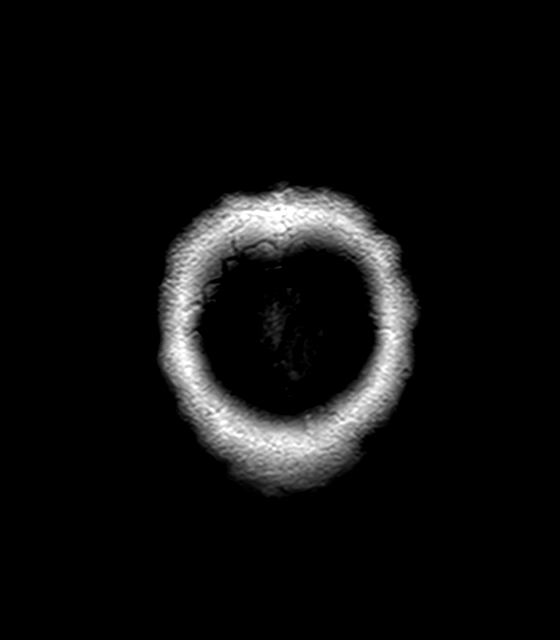

[Series 26: T1 post-contrast · sagittal · 5.0mm · 0.72mm/px · 1 of 23 slices shown (2 of 2)]
[im 1/23]
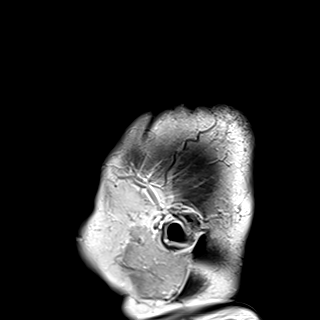

[41 of 48 positions shown; findings below may reference images not displayed]

FINDINGS: Brain: Postoperative changes in the left posterior fossa with trace
pneumocephalus in the left cerebellopontine angle. Resection of the
22 millimeter round left CPA mass seen preoperatively. There is mild
soft tissue enhancement or dural thickening extending into the left
IAC from the region of the mass (series 23, image 17, series 25,
image 6) which measures 6-7 millimeters. There is some preserved T2
signal in the left IAC. And preserved T2 signal in the left cochlea,
and vestibular structures.

The contralateral internal auditory structures appear stable and
within normal limits. Small volume if any extra-axial collection
underlying the craniotomy site. No signal abnormality in the
brainstem or cerebellum.

No restricted diffusion to suggest acute infarction. No midline
shift, mass effect, ventriculomegaly, or acute intracranial
hemorrhage. Cervicomedullary junction within normal limits. Stable
supratentorial gray and white matter signal, with mild nonspecific
scattered cerebral white matter T2 and FLAIR hyperintensity.
Partially empty sella.

Vascular: Major intracranial vascular flow voids are stable. The
right vertebral artery is dominant. The major dural venous sinuses
are enhancing and appear to be patent, including the left sigmoid
sinus.

Skull and upper cervical spine: Negative visible cervical spine.
Left posterior fossa craniotomy changes.

Sinuses/Orbits: Negative orbits. New sphenoid sinus fluid or mucosal
thickening. Trace paranasal sinus mucosal thickening elsewhere.

Other: New mild mastoid effusion. Right mastoids remain clear.
Postoperative changes to the left scalp.
IMPRESSION: 1. Resected left cerebellopontine angle mass with expected
postoperative findings. Mild enhancement now within the left IAC may
be postoperative in nature, mild dural thickening.
2. No new intracranial abnormality.

## 2019-08-18 MED ORDER — GADOBUTROL 1 MMOL/ML IV SOLN
10.0000 mL | Freq: Once | INTRAVENOUS | Status: AC | PRN
  Administered 2019-08-18: 10 mL via INTRAVENOUS

## 2019-08-18 NOTE — Progress Notes (Signed)
Physical Therapy Treatment Patient Details Name: [name] MRN: [id number] DOB: 02-09-1971 Today's Date: 08/18/2019    History of Present Illness 48 y.o. 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 Admitted 08/16/19 for craniotomy and tumor resection PMH-HTN, obesity    PT Comments    Patient mobilizing better today and far less symptomatic. Completed Berg Balance Assessment with pt scoring 35/56 (majority of points lost due to requiring supervision for activities; any imbalance pt experienced she was able to self-correct). Discussed benefits of OP vestibular rehab and indications that she should attend vs cancel her f/u appointment (if continues to improve with decreasing edema).     Follow Up Recommendations  Outpatient PT(Vestibular rehab)     Equipment Recommendations  None recommended by PT    Recommendations for Other Services       Precautions / Restrictions Precautions Precautions: Fall Restrictions Weight Bearing Restrictions: No    Mobility  Bed Mobility Overal bed mobility: Needs Assistance Bed Mobility: Supine to Sit     Supine to sit: Supervision(with rail)        Transfers Overall transfer level: Needs assistance Equipment used: None Transfers: Sit to/from Stand Sit to Stand: Min guard         General transfer comment: from normal height bed  Ambulation/Gait Ambulation/Gait assistance: Min guard Gait Distance (Feet): 15 Feet Assistive device: None Gait Pattern/deviations: Step-through pattern;Decreased stride length;Wide base of support Gait velocity: decr   General Gait Details: no LOB, wide BOS   Stairs             Wheelchair Mobility    Modified Rankin (Stroke Patients Only)       Balance Overall balance assessment: Needs assistance Sitting-balance support: No upper extremity supported;Feet unsupported Sitting  balance-Leahy Scale: Fair     Standing balance support: No upper extremity supported;During functional activity Standing balance-Leahy Scale: Fair                   Standardized Balance Assessment Standardized Balance Assessment : Berg Balance Test Berg Balance Test Sit to Stand: Able to stand  independently using hands Standing Unsupported: Able to stand safely 2 minutes Sitting with Back Unsupported but Feet Supported on Floor or Stool: Able to sit safely and securely 2 minutes Stand to Sit: Sits safely with minimal use of hands Transfers: Able to transfer with verbal cueing and /or supervision Standing Unsupported with Eyes Closed: Able to stand 10 seconds with supervision Standing Ubsupported with Feet Together: Able to place feet together independently and stand for 1 minute with supervision From Standing, Reach Forward with Outstretched Arm: Reaches forward but needs supervision From Standing Position, Pick up Object from Floor: Unable to pick up shoe, but reaches 2-5 cm (1-2") from shoe and balances independently(limited due to pain) From Standing Position, Turn to Look Behind Over each Shoulder: Turn sideways only but maintains balance Turn 360 Degrees: Able to turn 360 degrees safely but slowly Standing Unsupported, Alternately Place Feet on Step/Stool: Able to complete 4 steps without aid or supervision Standing Unsupported, One Foot in Front: Able to take small step independently and hold 30 seconds Standing on One Leg: Tries to lift leg/unable to hold 3 seconds but remains standing independently Total Score: 35        Cognition Arousal/Alertness: Awake/alert Behavior During Therapy: WFL for tasks assessed/performed Overall Cognitive Status: Within Functional Limits for tasks assessed  Exercises      General Comments        Pertinent Vitals/Pain Pain Assessment: 0-10 Pain Score: 5  Pain Location:  head Pain Descriptors / Indicators: Headache Pain Intervention(s): Limited activity within patient's tolerance;Monitored during session;Repositioned    Home Living                      Prior Function            PT Goals (current goals can now be found in the care plan section) Acute Rehab PT Goals Patient Stated Goal: regain independence Time For Goal Achievement: 08/31/19 Potential to Achieve Goals: Good Progress towards PT goals: Progressing toward goals    Frequency    Min 4X/week      PT Plan Current plan remains appropriate    Co-evaluation              AM-PAC PT "6 Clicks" Mobility   Outcome Measure  Help needed turning from your back to your side while in a flat bed without using bedrails?: None Help needed moving from lying on your back to sitting on the side of a flat bed without using bedrails?: A Little Help needed moving to and from a bed to a chair (including a wheelchair)?: A Little Help needed standing up from a chair using your arms (e.g., wheelchair or bedside chair)?: A Little Help needed to walk in hospital room?: A Little Help needed climbing 3-5 steps with a railing? : A Little 6 Click Score: 19    End of Session Equipment Utilized During Treatment: Gait belt Activity Tolerance: Patient tolerated treatment well Patient left: in chair;with call bell/phone within reach;with family/visitor present(RN denied need for chair alarm) Nurse Communication: Mobility status PT Visit Diagnosis: Unsteadiness on feet (R26.81);Other symptoms and signs involving the nervous system (R29.898);Dizziness and giddiness (R42)     Time: [id number] PT Time Calculation (min) (ACUTE ONLY): 16 min  Charges:  $Therapeutic Activity: 8-22 mins                      Barry Brunner, PT Pager [phone]    [name] 08/18/2019, 5:49 PM

## 2019-08-18 NOTE — Progress Notes (Signed)
Neurosurgery Service Progress Note  Subjective: No acute events overnight, vertiginous symptoms improving  Objective: Vitals:   08/18/19 0300 08/18/19 0400 08/18/19 0500 08/18/19 0600  BP: 107/76 121/89 101/67 97/72  Pulse: (!) 101 (!) 105 (!) 101 (!) 107  Resp: 17 14 20 16   Temp:  98.5 F (36.9 C)    TempSrc:  Oral    SpO2: 97% 98% 97% 97%  Weight:      Height:       Temp (24hrs), Avg:98.5 F (36.9 C), Min:97.6 F (36.4 C), Max:98.9 F (37.2 C)  CBC Latest Ref Rng & Units 08/16/2019 08/16/2019 08/16/2019  WBC 4.0 - 10.5 K/uL 12.5(H) - -  Hemoglobin 12.0 - 15.0 g/dL 13.1 12.9 11.9(L)  Hematocrit 36.0 - 46.0 % 39.5 38.0 35.0(L)  Platelets 150 - 400 K/uL 240 - -   BMP Latest Ref Rng & Units 08/16/2019 08/16/2019 08/16/2019  Glucose 70 - 99 mg/dL - 116(H) -  BUN 6 - 20 mg/dL - 19 -  Creatinine 0.44 - 1.00 mg/dL 0.78 0.70 -  BUN/Creat Ratio 6 - 22 (calc) - - -  Sodium 135 - 145 mmol/L - 139 139  Potassium 3.5 - 5.1 mmol/L - 3.3(L) 3.2(L)  Chloride 98 - 111 mmol/L - 105 -  CO2 22 - 32 mmol/L - - -  Calcium 8.9 - 10.3 mg/dL - - -    Intake/Output Summary (Last 24 hours) at 08/18/2019 0723 Last data filed at 08/18/2019 0600 Gross per 24 hour  Intake 1140 ml  Output 325 ml  Net 815 ml    Current Facility-Administered Medications:  .  0.9 %  sodium chloride infusion, , Intravenous, Continuous, Ostergard, Joyice Faster, MD, Stopped at 08/17/19 2205 .  acetaminophen (TYLENOL) tablet 650 mg, 650 mg, Oral, Q4H PRN **OR** acetaminophen (TYLENOL) suppository 650 mg, 650 mg, Rectal, Q4H PRN, Ostergard, Thomas A, MD .  amLODipine (NORVASC) tablet 10 mg, 10 mg, Oral, Daily, Ostergard, Thomas A, MD, 10 mg at 08/17/19 1058 .  Chlorhexidine Gluconate Cloth 2 % PADS 6 each, 6 each, Topical, Daily, Judith Part, MD, 6 each at 08/17/19 1554 .  cloNIDine (CATAPRES) tablet 0.3 mg, 0.3 mg, Oral, Daily, Ostergard, Thomas A, MD, 0.3 mg at 08/17/19 1057 .  docusate sodium (COLACE) capsule 100 mg,  100 mg, Oral, BID, Judith Part, MD, 100 mg at 08/17/19 2202 .  famotidine (PEPCID) tablet 20 mg, 20 mg, Oral, Daily, Ostergard, Thomas A, MD, 20 mg at 08/17/19 1057 .  feeding supplement (BOOST / RESOURCE BREEZE) liquid 1 Container, 1 Container, Oral, TID BM, Judith Part, MD, 1 Container at 08/17/19 1057 .  feeding supplement (PRO-STAT SUGAR FREE 64) liquid 30 mL, 30 mL, Oral, BID, Ostergard, Thomas A, MD .  heparin injection 5,000 Units, 5,000 Units, Subcutaneous, Q8H, Judith Part, MD, 5,000 Units at 08/18/19 [phone] .  HYDROcodone-acetaminophen (NORCO/VICODIN) 5-325 MG per tablet 1 tablet, 1 tablet, Oral, Q4H PRN, Judith Part, MD, 1 tablet at 08/18/19 0425 .  HYDROmorphone (DILAUDID) injection 0.5 mg, 0.5 mg, Intravenous, Q3H PRN, Judith Part, MD, 0.5 mg at 08/18/19 0627 .  labetalol (NORMODYNE) injection 10-40 mg, 10-40 mg, Intravenous, Q10 min PRN, Ostergard, Thomas A, MD .  labetalol (NORMODYNE) tablet 200 mg, 200 mg, Oral, Daily, Ostergard, Thomas A, MD, 200 mg at 08/17/19 1057 .  lisinopril (ZESTRIL) tablet 10 mg, 10 mg, Oral, QHS, Ostergard, Joyice Faster, MD, 10 mg at 08/17/19 2202 .  LORazepam (ATIVAN) injection 0.5 mg, 0.5  mg, Intravenous, Once, Bergman, Meghan D, NP .  meclizine (ANTIVERT) tablet 25 mg, 25 mg, Oral, TID PRN, Judith Part, MD .  ondansetron (ZOFRAN) tablet 4 mg, 4 mg, Oral, Q4H PRN **OR** ondansetron (ZOFRAN) injection 4 mg, 4 mg, Intravenous, Q4H PRN, Judith Part, MD, 4 mg at 08/17/19 0641 .  polyethylene glycol (MIRALAX / GLYCOLAX) packet 17 g, 17 g, Oral, Daily PRN, Judith Part, MD .  promethazine (PHENERGAN) injection 12.5-25 mg, 12.5-25 mg, Intravenous, Q4H PRN, 25 mg at 08/18/19 0627 **OR** promethazine (PHENERGAN) tablet 12.5-25 mg, 12.5-25 mg, Oral, Q4H PRN, Judith Part, MD .  triamterene-hydrochlorothiazide (MAXZIDE) 75-50 MG per tablet 1 tablet, 1 tablet, Oral, Daily, Ostergard, Joyice Faster, MD, 1 tablet  at 08/17/19 1057   Physical Exam: AOx3, PERRL, EOMI, FS with some mild L V2/3 numbness, Strength 5/5 x4, SILTx4, no drift  Assessment & Plan: 48 y.o. woman s/p retrosig rsxn of L VS, facial N function intact post-op.  -MRI today -cont meclizine -can d/c IVF when tolerating a regular diet -xfer to stepdown -SCDs/TEDs, SQH  Judith Part  08/18/19 7:23 AM

## 2019-08-19 MED ORDER — MECLIZINE HCL 25 MG PO TABS: 25.0000 mg | ORAL_TABLET | Freq: Three times a day (TID) | ORAL | 0 refills | Status: DC | PRN

## 2019-08-19 MED ORDER — HYDROCODONE-ACETAMINOPHEN 5-325 MG PO TABS: 1.0000 | ORAL_TABLET | ORAL | 0 refills | Status: DC | PRN

## 2019-08-19 NOTE — Progress Notes (Signed)
Neurosurgery Service Progress Note  Subjective: No acute events overnight, vertiginous symptoms improving again  Objective: Vitals:   08/18/19 1937 08/19/19 0024 08/19/19 0505 08/19/19 0747  BP: 105/77 114/77 127/90 118/88  Pulse: (!) 120 (!) 104 (!) 108 (!) 114  Resp: 18 16 18 18   Temp: 99.8 F (37.7 C) 98.8 F (37.1 C) 98.8 F (37.1 C) 98.5 F (36.9 C)  TempSrc: Oral Oral Oral Oral  SpO2:  94% 91% 93%  Weight:      Height:       Temp (24hrs), Avg:98.8 F (37.1 C), Min:98.4 F (36.9 C), Max:99.8 F (37.7 C)  CBC Latest Ref Rng & Units 08/16/2019 08/16/2019 08/16/2019  WBC 4.0 - 10.5 K/uL 12.5(H) - -  Hemoglobin 12.0 - 15.0 g/dL 13.1 12.9 11.9(L)  Hematocrit 36.0 - 46.0 % 39.5 38.0 35.0(L)  Platelets 150 - 400 K/uL 240 - -   BMP Latest Ref Rng & Units 08/16/2019 08/16/2019 08/16/2019  Glucose 70 - 99 mg/dL - 116(H) -  BUN 6 - 20 mg/dL - 19 -  Creatinine 0.44 - 1.00 mg/dL 0.78 0.70 -  BUN/Creat Ratio 6 - 22 (calc) - - -  Sodium 135 - 145 mmol/L - 139 139  Potassium 3.5 - 5.1 mmol/L - 3.3(L) 3.2(L)  Chloride 98 - 111 mmol/L - 105 -  CO2 22 - 32 mmol/L - - -  Calcium 8.9 - 10.3 mg/dL - - -   No intake or output data in the 24 hours ending 08/19/19 0910  Current Facility-Administered Medications:  .  0.9 %  sodium chloride infusion, , Intravenous, Continuous, Cayci Mcnabb, Joyice Faster, MD, Stopped at 08/17/19 2205 .  acetaminophen (TYLENOL) tablet 650 mg, 650 mg, Oral, Q4H PRN **OR** acetaminophen (TYLENOL) suppository 650 mg, 650 mg, Rectal, Q4H PRN, Ramina Hulet A, MD .  amLODipine (NORVASC) tablet 10 mg, 10 mg, Oral, Daily, Chardae Mulkern A, MD, 10 mg at 08/17/19 1058 .  Chlorhexidine Gluconate Cloth 2 % PADS 6 each, 6 each, Topical, Daily, Judith Part, MD, 6 each at 08/18/19 1000 .  cloNIDine (CATAPRES) tablet 0.3 mg, 0.3 mg, Oral, Daily, Meggin Ola A, MD, 0.3 mg at 08/17/19 1057 .  docusate sodium (COLACE) capsule 100 mg, 100 mg, Oral, BID, Kutler Vanvranken,  Carmencita Cusic A, MD, 100 mg at 08/18/19 2121 .  famotidine (PEPCID) tablet 20 mg, 20 mg, Oral, Daily, Lorren Splawn A, MD, 20 mg at 08/17/19 1057 .  feeding supplement (BOOST / RESOURCE BREEZE) liquid 1 Container, 1 Container, Oral, TID BM, Judith Part, MD, 1 Container at 08/17/19 1057 .  feeding supplement (PRO-STAT SUGAR FREE 64) liquid 30 mL, 30 mL, Oral, BID, Shylah Dossantos A, MD, 30 mL at 08/18/19 2121 .  heparin injection 5,000 Units, 5,000 Units, Subcutaneous, Q8H, Judith Part, MD, 5,000 Units at 08/19/19 0519 .  HYDROcodone-acetaminophen (NORCO/VICODIN) 5-325 MG per tablet 1 tablet, 1 tablet, Oral, Q4H PRN, Judith Part, MD, 1 tablet at 08/19/19 0519 .  HYDROmorphone (DILAUDID) injection 0.5 mg, 0.5 mg, Intravenous, Q3H PRN, Judith Part, MD, 0.5 mg at 08/19/19 0020 .  labetalol (NORMODYNE) injection 10-40 mg, 10-40 mg, Intravenous, Q10 min PRN, Arantza Darrington A, MD .  labetalol (NORMODYNE) tablet 200 mg, 200 mg, Oral, Daily, Shauntel Prest A, MD, 200 mg at 08/17/19 1057 .  lisinopril (ZESTRIL) tablet 10 mg, 10 mg, Oral, QHS, Shakeria Robinette, Joyice Faster, MD, 10 mg at 08/18/19 2121 .  LORazepam (ATIVAN) injection 0.5 mg, 0.5 mg, Intravenous, Once, Bergman, Meghan D,  NP .  meclizine (ANTIVERT) tablet 25 mg, 25 mg, Oral, TID PRN, Judith Part, MD, 25 mg at 08/18/19 0944 .  ondansetron (ZOFRAN) tablet 4 mg, 4 mg, Oral, Q4H PRN **OR** ondansetron (ZOFRAN) injection 4 mg, 4 mg, Intravenous, Q4H PRN, Judith Part, MD, 4 mg at 08/18/19 1109 .  polyethylene glycol (MIRALAX / GLYCOLAX) packet 17 g, 17 g, Oral, Daily PRN, Judith Part, MD .  promethazine (PHENERGAN) injection 12.5-25 mg, 12.5-25 mg, Intravenous, Q4H PRN, 25 mg at 08/18/19 0627 **OR** promethazine (PHENERGAN) tablet 12.5-25 mg, 12.5-25 mg, Oral, Q4H PRN, Judith Part, MD .  triamterene-hydrochlorothiazide (MAXZIDE) 75-50 MG per tablet 1 tablet, 1 tablet, Oral, Daily, Kaislee Chao,  Joyice Faster, MD, 1 tablet at 08/17/19 1057   Physical Exam: AOx3, PERRL, EOMI, FS with some L V2/3 numbness, Strength 5/5 x4, SILTx4, no drift  Assessment & Plan: 48 y.o. woman s/p retrosig rsxn of L VS, facial N function intact post-op. MRI w/ gross total resection  -cont meclizine -SCDs/TEDs, SQH -discharge home today versus tomorrow, will see how she does mobilizing this afternoon  Judith Part  08/19/19 9:10 AM

## 2019-08-19 NOTE — Progress Notes (Signed)
Physical Therapy Treatment Patient Details Name: [name] MRN: [id number] DOB: [birth year] Today's Date: 08/19/2019    History of Present Illness Pt is a 48 y.o. 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 Admitted 08/16/19 for craniotomy and tumor resection PMH-HTN, obesity    PT Comments    Focus of session was on higher level balance activities (see below for details). Pt greatly limited this session secondary to persistent nausea. Pt's RN was notified. Pt would continue to benefit from skilled physical therapy services at this time while admitted and after d/c to address the below listed limitations in order to improve overall safety and independence with functional mobility.    Follow Up Recommendations  Outpatient PT;Other (comment)(vestibular rehab)     Equipment Recommendations  None recommended by PT    Recommendations for Other Services       Precautions / Restrictions Precautions Precautions: Fall Restrictions Weight Bearing Restrictions: No    Mobility  Bed Mobility Overal bed mobility: Modified Independent                Transfers Overall transfer level: Needs assistance Equipment used: None Transfers: Sit to/from Stand Sit to Stand: Supervision         General transfer comment: from normal height bed  Ambulation/Gait Ambulation/Gait assistance: Min guard Gait Distance (Feet): 40 Feet Assistive device: None Gait Pattern/deviations: Step-through pattern;Decreased stride length;Wide base of support Gait velocity: decr   General Gait Details: no LOB, wide BOS   Stairs             Wheelchair Mobility    Modified Rankin (Stroke Patients Only)       Balance Overall balance assessment: Needs assistance Sitting-balance support: No upper extremity supported;Feet unsupported Sitting balance-Leahy Scale: Good     Standing  balance support: No upper extremity supported;During functional activity Standing balance-Leahy Scale: Fair               High level balance activites: Side stepping;Backward walking;Direction changes;Turns High Level Balance Comments: min guard overall; attempted tandem stance and walking but pt unable to perform and maintain balance; pt able to perform squatting and reaching task to simulate picking up an object from the floor without issues            Cognition Arousal/Alertness: Awake/alert Behavior During Therapy: WFL for tasks assessed/performed Overall Cognitive Status: Within Functional Limits for tasks assessed                                        Exercises      General Comments        Pertinent Vitals/Pain Pain Assessment: Faces Faces Pain Scale: Hurts little more Pain Location: head Pain Descriptors / Indicators: Headache Pain Intervention(s): Monitored during session;Repositioned    Home Living                      Prior Function            PT Goals (current goals can now be found in the care plan section) Acute Rehab PT Goals PT Goal Formulation: With patient Time For Goal Achievement: 08/31/19 Potential to Achieve Goals: Good Progress towards PT goals: Progressing toward goals    Frequency    Min 4X/week      PT Plan Current plan remains appropriate  Co-evaluation              AM-PAC PT "6 Clicks" Mobility   Outcome Measure  Help needed turning from your back to your side while in a flat bed without using bedrails?: None Help needed moving from lying on your back to sitting on the side of a flat bed without using bedrails?: None Help needed moving to and from a bed to a chair (including a wheelchair)?: A Little Help needed standing up from a chair using your arms (e.g., wheelchair or bedside chair)?: None Help needed to walk in hospital room?: A Little Help needed climbing 3-5 steps with a railing?  : A Little 6 Click Score: 21    End of Session   Activity Tolerance: Patient tolerated treatment well Patient left: in bed;with call bell/phone within reach Nurse Communication: Mobility status;Other (comment)(pt requesting nausea medication) PT Visit Diagnosis: Unsteadiness on feet (R26.81);Other symptoms and signs involving the nervous system (R29.898);Dizziness and giddiness (R42)     Time: [id number] PT Time Calculation (min) (ACUTE ONLY): 11 min  Charges:  $Therapeutic Activity: 8-22 mins                     Anastasio Champion, DPT  Acute Rehabilitation Services Pager [phone] Office [place] 08/19/2019, 12:27 PM

## 2019-08-19 NOTE — Discharge Summary (Signed)
Discharge Summary  Date of Admission: 08/16/2019  Date of Discharge: 08/19/19  Attending Physician: Emelda Brothers, MD  Hospital Course: Patient was admitted following an uncomplicated retrosigmoid resection of a vestibular schwannoma. She was recovered in PACU and transferred to 4N. A post-op MRI showed gross total resection of the mass, her post-operative facial function was intact, her hospital course was uncomplicated and the patient was discharged home on 08/19/2019. She will follow up in clinic with me in 2 weeks.  Neurologic exam at discharge:  AOx3, PERRL, EOMI, FS, TM Strength 5/5 x4, SILTx4, no drift  Discharge diagnosis: Vestibular Schwannoma  Judith Part, MD 08/19/19 2:46 PM

## 2019-08-19 NOTE — Progress Notes (Signed)
Patient discharged information explained.  Patient verbalized understanding and has no further questions at this time.    Patient wheeled out in wheelchair to personal vehicle.

## 2019-08-19 NOTE — Progress Notes (Signed)
Occupational Therapy Treatment Patient Details Name: [name] MRN: [id number] DOB: Apr 20, 1971 Today's Date: 08/19/2019    History of present illness Pt is a 48 y.o. 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 Admitted 08/16/19 for craniotomy and tumor resection PMH-HTN, obesity   OT comments  Pt making great progress towards OT goals this session. Session focus on toileting tasks and higher level IADL balance task. Pt complete functional mobility from EOB> bathroom with no AD with supervision. Pt completed all toileting tasks with supervision. Pt able to gather needed ADL items from various surface heights in the room with no AD and close supervision. Pt likely reaching baseline level of function, likely to DC home with OP OT. Will continue to follow acutely per POC.    Follow Up Recommendations  Outpatient OT;Other (comment)(neuro outpt, pending progress)    Equipment Recommendations  None recommended by OT    Recommendations for Other Services      Precautions / Restrictions Precautions Precautions: Fall Restrictions Weight Bearing Restrictions: No       Mobility Bed Mobility Overal bed mobility: Modified Independent Bed Mobility: Supine to Sit     Supine to sit: Modified independent (Device/Increase time)     General bed mobility comments: no physical assist; use of bed rails with HOB flat  Transfers Overall transfer level: Needs assistance Equipment used: None Transfers: Sit to/from Stand Sit to Stand: Supervision         General transfer comment: from normal height bed    Balance Overall balance assessment: Needs assistance Sitting-balance support: No upper extremity supported;Feet unsupported Sitting balance-Leahy Scale: Good     Standing balance support: No upper extremity supported;During functional activity Standing balance-Leahy Scale:  Good Standing balance comment: able to reach out of BOS to gather needed items with supervision, reports mild dizziness during task             High level balance activites: Side stepping;Backward walking;Direction changes;Turns High Level Balance Comments: min guard overall; attempted tandem stance and walking but pt unable to perform and maintain balance; pt able to perform squatting and reaching task to simulate picking up an object from the floor without issues           ADL either performed or assessed with clinical judgement   ADL Overall ADL's : Needs assistance/impaired     Grooming: Wash/dry hands;Standing;Supervision/safety             Upper Body Dressing Details (indicate cue type and reason): pt reports she got dressed I this AM with supervision from NT     Toilet Transfer: Supervision/safety;Ambulation;Regular Toilet   Toileting- Water quality scientist and Hygiene: Supervision/safety;Sit to/from Nurse, children's Details (indicate cue type and reason): reports seat in shower at home Functional mobility during ADLs: Supervision/safety General ADL Comments: pt progressing well, session focus on higher leve; IADL balance tasks, pt required supervision to gather ADL items in room at various surface levels     Vision       Perception     Praxis      Cognition Arousal/Alertness: Awake/alert Behavior During Therapy: WFL for tasks assessed/performed Overall Cognitive Status: Within Functional Limits for tasks assessed                                 General Comments: very pleasant and agreeable  to session despite having woken pt up from nap        Exercises     Shoulder Instructions       General Comments      Pertinent Vitals/ Pain       Pain Assessment: Faces Faces Pain Scale: Hurts a little bit Pain Location: back of head Pain Descriptors / Indicators: Headache;Discomfort Pain Intervention(s): Monitored during  session;Limited activity within patient's tolerance  Home Living                                          Prior Functioning/Environment              Frequency  Min 2X/week        Progress Toward Goals  OT Goals(current goals can now be found in the care plan section)  Progress towards OT goals: Progressing toward goals  Acute Rehab OT Goals Patient Stated Goal: regain independence OT Goal Formulation: With patient Time For Goal Achievement: 08/31/19 Potential to Achieve Goals: Good  Plan Discharge plan remains appropriate    Co-evaluation                 AM-PAC OT "6 Clicks" Daily Activity     Outcome Measure   Help from another person eating meals?: None Help from another person taking care of personal grooming?: A Little Help from another person toileting, which includes using toliet, bedpan, or urinal?: A Little Help from another person bathing (including washing, rinsing, drying)?: A Little Help from another person to put on and taking off regular upper body clothing?: None Help from another person to put on and taking off regular lower body clothing?: A Lot 6 Click Score: 19    End of Session    OT Visit Diagnosis: Unsteadiness on feet (R26.81);Dizziness and giddiness (R42)   Activity Tolerance Patient tolerated treatment well   Patient Left in bed;with call bell/phone within reach   Nurse Communication          Time: [id number] OT Time Calculation (min): 9 min  Charges: OT General Charges $OT Visit: 1 Visit OT Treatments $Self Care/Home Management : 8-22 mins  Lanier Clam., COTA/L Acute Rehabilitation Services [phone] [place] 08/19/2019, 3:46 PM

## 2019-10-19 ENCOUNTER — Telehealth (INDEPENDENT_AMBULATORY_CARE_PROVIDER_SITE_OTHER): Payer: BC Managed Care – PPO | Admitting: Family Medicine

## 2019-10-19 ENCOUNTER — Other Ambulatory Visit: Payer: Self-pay

## 2019-10-19 VITALS — BP 117/78 | Ht 64.0 in | Wt 269.0 lb

## 2019-10-19 DIAGNOSIS — I10 Essential (primary) hypertension: Secondary | ICD-10-CM | POA: Diagnosis not present

## 2019-10-19 DIAGNOSIS — Z1231 Encounter for screening mammogram for malignant neoplasm of breast: Secondary | ICD-10-CM

## 2019-10-19 DIAGNOSIS — R7301 Impaired fasting glucose: Secondary | ICD-10-CM

## 2019-10-19 DIAGNOSIS — Z6841 Body Mass Index (BMI) 40.0 and over, adult: Secondary | ICD-10-CM

## 2019-10-19 DIAGNOSIS — Z79899 Other long term (current) drug therapy: Secondary | ICD-10-CM

## 2019-10-19 DIAGNOSIS — E559 Vitamin D deficiency, unspecified: Secondary | ICD-10-CM

## 2019-10-19 MED ORDER — LABETALOL HCL 200 MG PO TABS: ORAL_TABLET | ORAL | 3 refills | Status: DC

## 2019-10-19 MED ORDER — CLONIDINE HCL 0.3 MG PO TABS: ORAL_TABLET | ORAL | 3 refills | Status: DC

## 2019-10-19 NOTE — Progress Notes (Signed)
Virtual Visit via Telephone Note  I connected with [name] on 10/19/19 at 10:20 AM EST by telephone and verified that I am speaking with the correct person using two identifiers.  Location: Patient: CAR Provider: OFFICE I discussed the limitations, risks, security and privacy concerns of performing an evaluation and management service by telephone and the availability of in person appointments. I also discussed with the patient that there may be a patient responsible charge related to this service. The patient expressed understanding and agreed to proceed.   History of Present Illness: F/u left craniotomy for schwanoma on 08/16/2019,  Returned to work 09/28/2020, unable to hear from left ear AND SLIGHT DIZZINESS AFTER STANDING FOR A LONG [place] 25%RETURN HEARING She has ahd marked reduction in antihypertensive meds following recent hospitalization, checks BP daily and is doing very well Denies recent fever or chills. Denies sinus pressure, nasal congestion, ear pain or sore throat. Denies chest congestion, productive cough or wheezing. Denies chest pains, palpitations and leg swelling Denies abdominal pain, nausea, vomiting,diarrhea or constipation.   Denies dysuria, frequency, hesitancy or incontinence. Denies joint pain, swelling and limitation in mobility. Denies headaches, seizures, numbness, or tingling. Denies depression, anxiety or insomnia. Denies skin break down or rash.       Observations/Objective: BP 117/78   Ht 5\' 4"  (1.626 m)   Wt 269 lb (122 kg)   BMI 46.17 kg/m  Good communication with no confusion and intact memory. Alert and oriented x 3 No signs of respiratory distress during speech    Assessment and Plan:  Malignant hypertension Controlled, no change in medication DASH diet and commitment to daily physical activity for a minimum of 30 minutes discussed and encouraged, as a part of hypertension management. The importance of attaining a  healthy weight is also discussed.  BP/Weight 10/19/2019 08/19/2019 08/16/2019 08/09/2019 07/05/2019 05/12/2019 [id number]  Systolic BP 123XX123 0000000 - 0000000 [id number]  Diastolic BP 78 84 - 66 95 82 70  Wt. (Lbs) 269 - 278 281.3 281.8 275 271.04  BMI 46.17 - 49.25 49.83 49.92 47.2 46.52       Morbid obesity  Patient re-educated about  the importance of commitment to a  minimum of 150 minutes of exercise per week as able.  The importance of healthy food choices with portion control discussed, as well as eating regularly and within a 12 hour window most days. The need to choose "clean , green" food 50 to 75% of the time is discussed, as well as to make water the primary drink and set a goal of 64 ounces water daily.    Weight /BMI 10/19/2019 08/16/2019 08/09/2019  WEIGHT 269 lb 278 lb 281 lb 4.8 oz  HEIGHT 5\' 4"  5\' 3"  5\' 3"   BMI 46.17 kg/m2 49.25 kg/m2 49.83 kg/m2       Follow Up Instructions:    I discussed the assessment and treatment plan with the patient. The patient was provided an opportunity to ask questions and all were answered. The patient agreed with the plan and demonstrated an understanding of the instructions.   The patient was advised to call back or seek an in-person evaluation if the symptoms worsen or if the condition fails to improve as anticipated.  I provided 15 minutes of non-face-to-face time during this encounter.   Tula Nakayama, MD

## 2019-10-19 NOTE — Patient Instructions (Addendum)
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  Please do follow through on bariatric surgery for weight manageent, and in the interim, focus on healthy food choice and poretion control  Think about what you will eat, plan ahead. Choose " clean, green, fresh or frozen" over canned, processed or packaged foods which are more sugary, salty and fatty. 70 to 75% of food eaten should be vegetables and fruit. Three meals at set times with snacks allowed between meals, but they must be fruit or vegetables. Aim to eat over a 12 hour period , example 7 am to 7 pm, and STOP after  your last meal of the day. Drink water,generally about 64 ounces per day, no other drink is as healthy. Fruit juice is best enjoyed in a healthy way, by EATING the fruit. It is important that you exercise regularly at least 30 minutes 5 times a week. If you develop chest pain, have severe difficulty breathing, or feel very tired, stop exercising immediately and seek medical attention  Thanks for choosing [facility], we consider it a privelige to serve you. Best for 2021!

## 2019-10-21 ENCOUNTER — Encounter: Payer: Self-pay | Admitting: Family Medicine

## 2019-10-21 ENCOUNTER — Other Ambulatory Visit: Payer: Self-pay | Admitting: Family Medicine

## 2019-10-21 DIAGNOSIS — Z1231 Encounter for screening mammogram for malignant neoplasm of breast: Secondary | ICD-10-CM

## 2019-10-21 NOTE — Assessment & Plan Note (Signed)
Controlled, no change in medication DASH diet and commitment to daily physical activity for a minimum of 30 minutes discussed and encouraged, as a part of hypertension management. The importance of attaining a healthy weight is also discussed.  BP/Weight 10/19/2019 08/19/2019 08/16/2019 08/09/2019 07/05/2019 05/12/2019 [id number]  Systolic BP 123XX123 0000000 - 0000000 [id number]  Diastolic BP 78 84 - 66 95 82 70  Wt. (Lbs) 269 - 278 281.3 281.8 275 271.04  BMI 46.17 - 49.25 49.83 49.92 47.2 46.52

## 2019-10-21 NOTE — Assessment & Plan Note (Signed)
  Patient re-educated about  the importance of commitment to a  minimum of 150 minutes of exercise per week as able.  The importance of healthy food choices with portion control discussed, as well as eating regularly and within a 12 hour window most days. The need to choose "clean , green" food 50 to 75% of the time is discussed, as well as to make water the primary drink and set a goal of 64 ounces water daily.    Weight /BMI 10/19/2019 08/16/2019 08/09/2019  WEIGHT 269 lb 278 lb 281 lb 4.8 oz  HEIGHT 5\' 4"  5\' 3"  5\' 3"   BMI 46.17 kg/m2 49.25 kg/m2 49.83 kg/m2

## 2019-10-24 ENCOUNTER — Ambulatory Visit (HOSPITAL_COMMUNITY): Payer: BC Managed Care – PPO

## 2019-11-09 ENCOUNTER — Ambulatory Visit (HOSPITAL_COMMUNITY)
Admission: RE | Admit: 2019-11-09 | Discharge: 2019-11-09 | Disposition: A | Discharge status: Home / Self Care | Payer: BC Managed Care – PPO | Source: Ambulatory Visit | Attending: Family Medicine | Admitting: Family Medicine

## 2019-11-09 ENCOUNTER — Other Ambulatory Visit: Payer: Self-pay

## 2019-11-09 DIAGNOSIS — Z1231 Encounter for screening mammogram for malignant neoplasm of breast: Secondary | ICD-10-CM | POA: Diagnosis not present

## 2019-11-09 IMAGING — MG DIGITAL SCREENING BILAT W/ TOMO W/ CAD
8 series · 8 of 24 positions shown · non-contrast
Comparison: Previous exam(s).

ACR Breast Density Category a: The breast tissue is almost entirely
fatty.

CLINICAL DATA: Screening.

EXAM:
DIGITAL SCREENING BILATERAL MAMMOGRAM WITH TOMO AND CAD

[L MLO synth-2D]
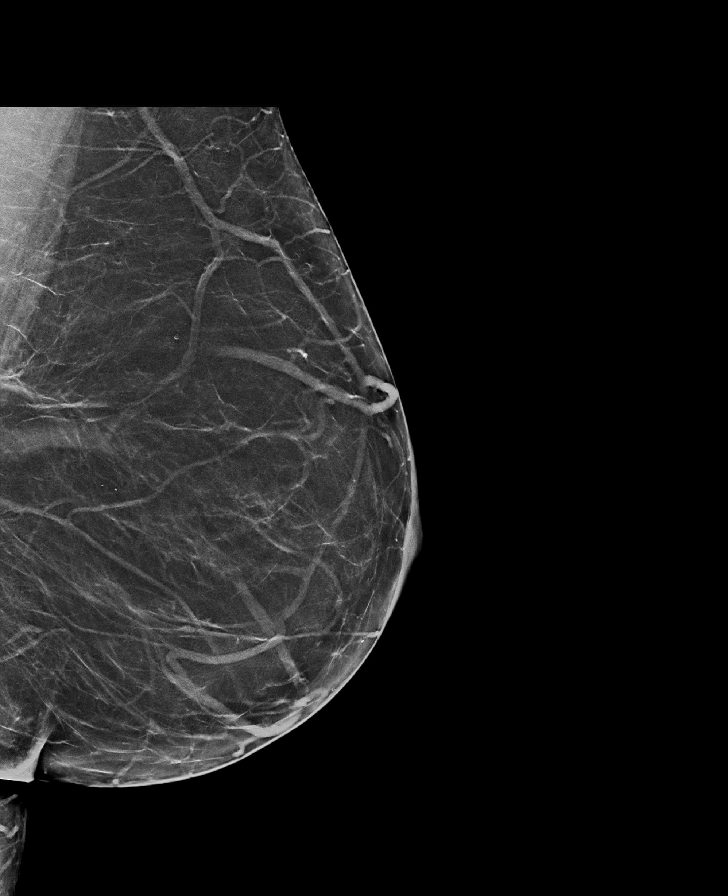

[R MLO synth-2D]
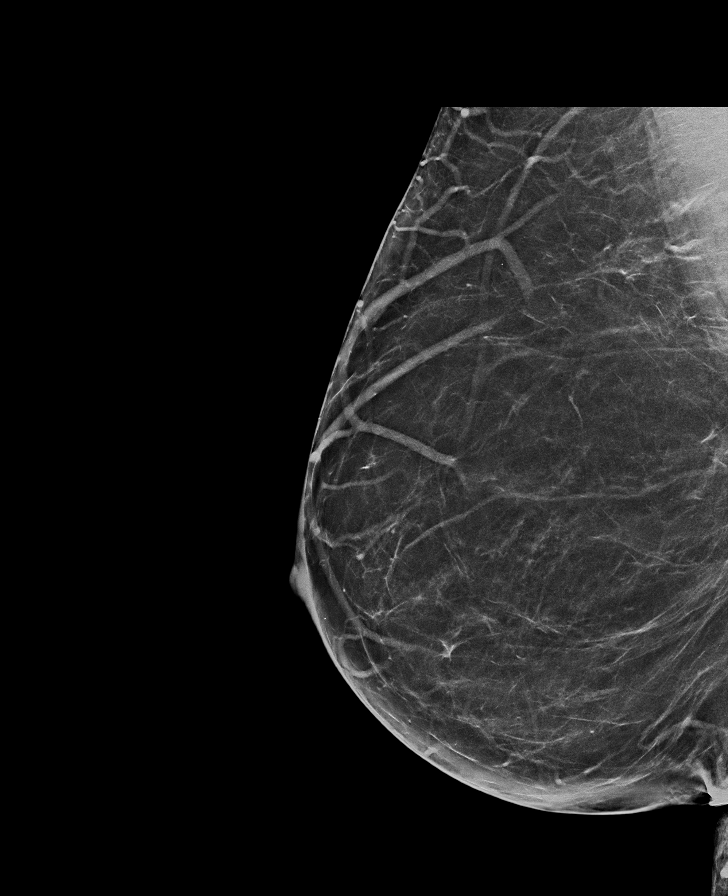

[L CC synth-2D]
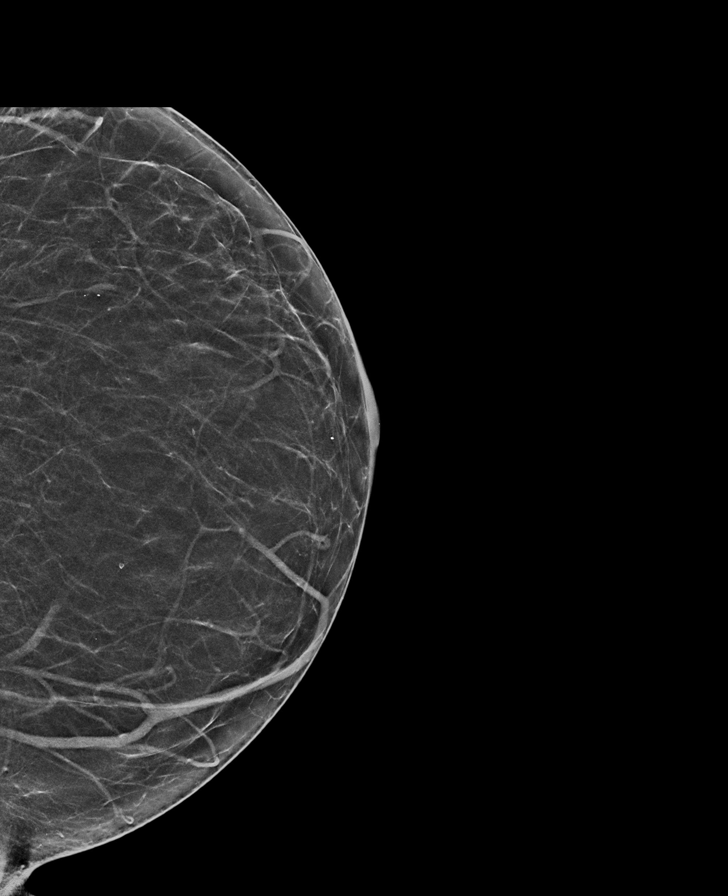

[R CC synth-2D]
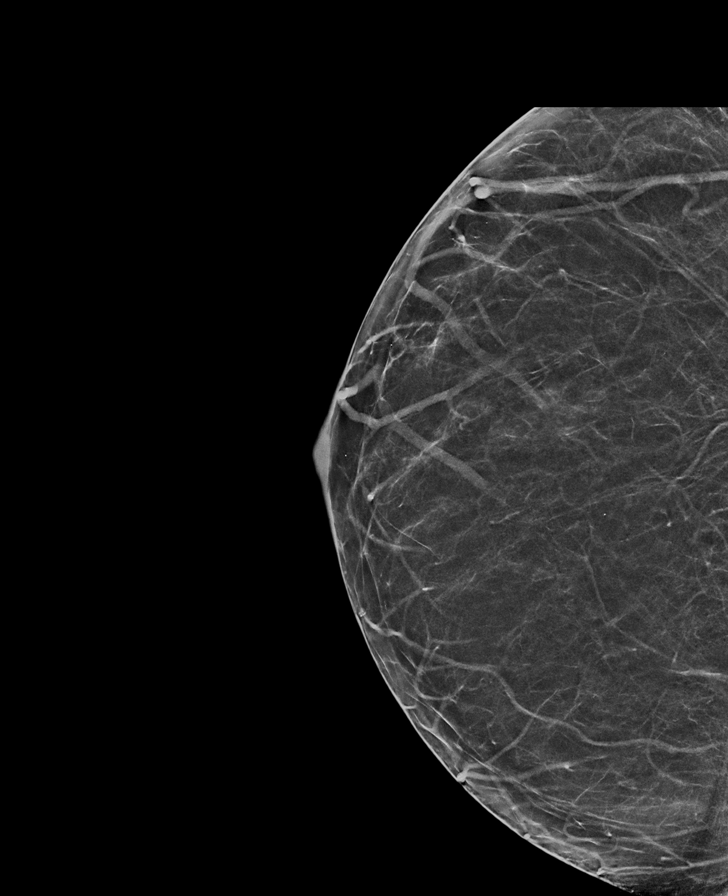

[L CC tomo · tomo slice 30/59.0]
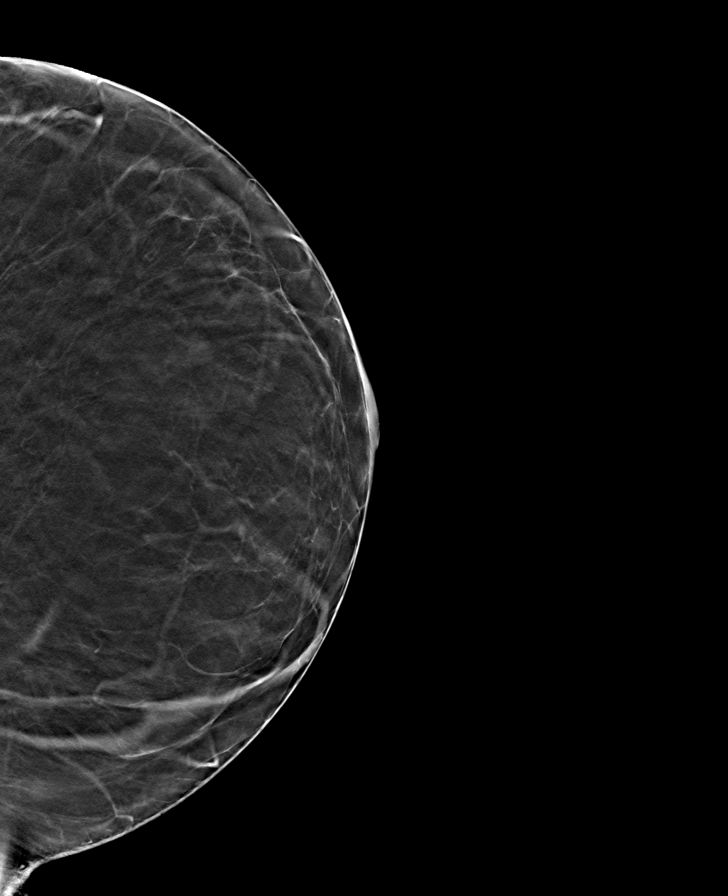

[R MLO tomo · tomo slice 37/74.0]
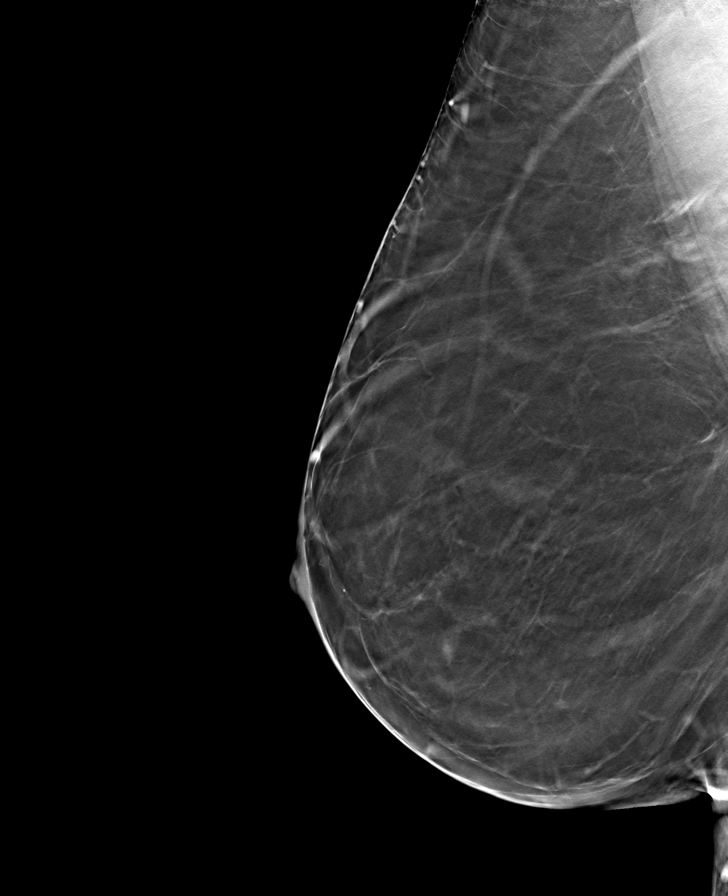

[R CC tomo · tomo slice 32/63.0]
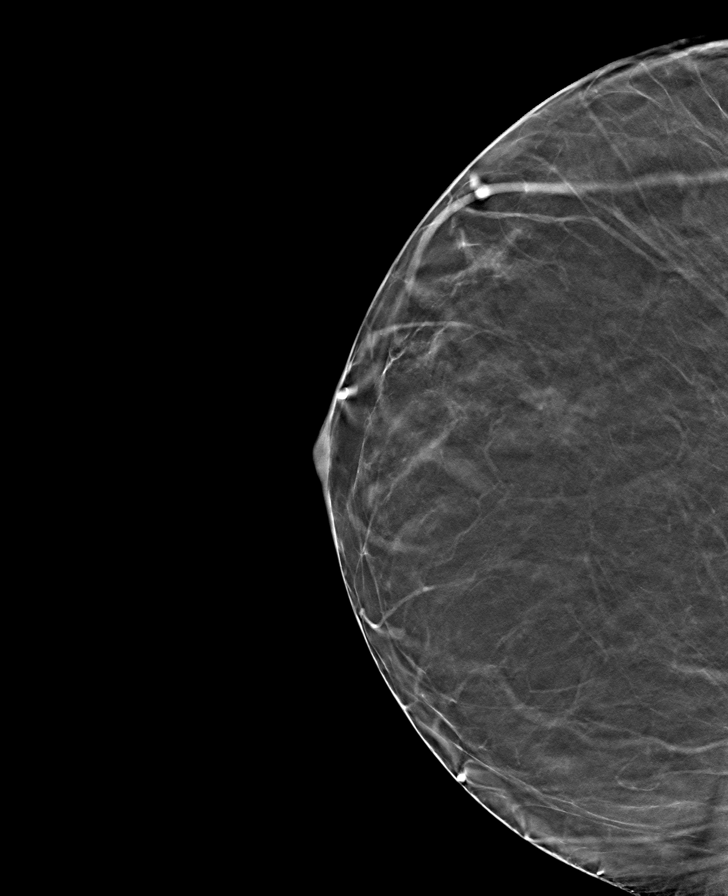

[L MLO tomo · tomo slice 36/71.0]
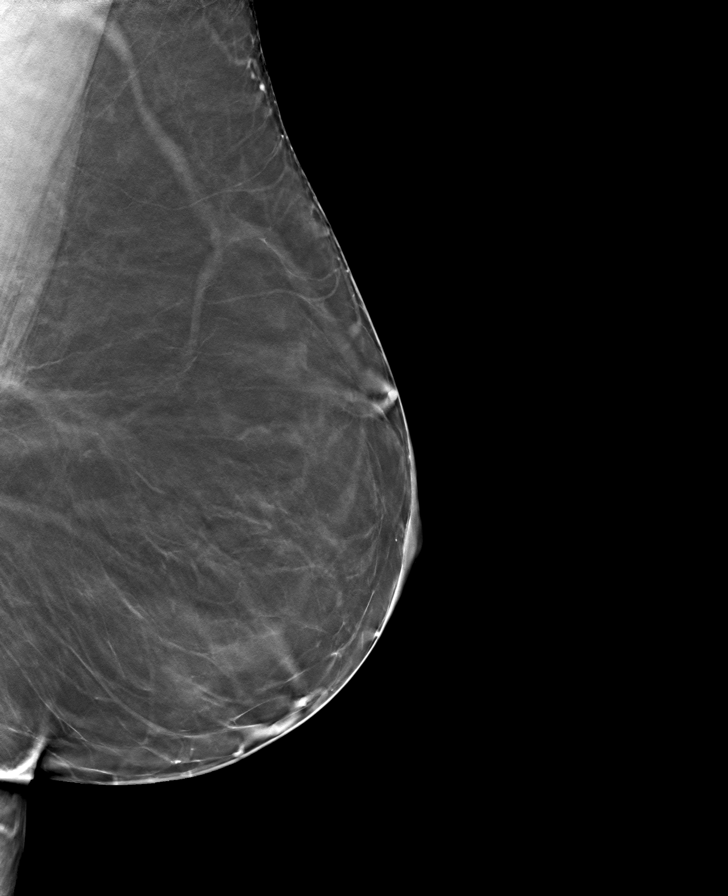

[8 of 24 positions shown; findings below may reference images not displayed]

FINDINGS: There are no findings suspicious for malignancy. Images were
processed with CAD.
IMPRESSION: No mammographic evidence of malignancy. A result letter of this
screening mammogram will be mailed directly to the patient.

RECOMMENDATION:
Screening mammogram in one year. (Code:[TA])

BI-RADS CATEGORY  1: Negative.

## 2020-01-10 NOTE — Telephone Encounter (Signed)
disregard

## 2020-01-12 ENCOUNTER — Other Ambulatory Visit: Payer: Self-pay | Admitting: Family Medicine

## 2020-01-12 DIAGNOSIS — R079 Chest pain, unspecified: Secondary | ICD-10-CM

## 2020-01-12 HISTORY — DX: Chest pain, unspecified: R07.9

## 2020-02-01 ENCOUNTER — Inpatient Hospital Stay (HOSPITAL_COMMUNITY)
Admission: EM | Admit: 2020-02-01 | Discharge: 2020-02-02 | DRG: 247 | Disposition: A | Discharge status: Home / Self Care | Payer: BC Managed Care – PPO | Attending: Cardiovascular Disease | Admitting: Cardiovascular Disease

## 2020-02-01 ENCOUNTER — Encounter (HOSPITAL_COMMUNITY)
Admission: EM | Disposition: A | Discharge status: Home / Self Care | Payer: Self-pay | Source: Home / Self Care | Attending: Cardiovascular Disease

## 2020-02-01 ENCOUNTER — Encounter (HOSPITAL_COMMUNITY): Payer: Self-pay | Admitting: Emergency Medicine

## 2020-02-01 ENCOUNTER — Other Ambulatory Visit: Payer: Self-pay

## 2020-02-01 ENCOUNTER — Inpatient Hospital Stay (HOSPITAL_COMMUNITY): Payer: BC Managed Care – PPO

## 2020-02-01 DIAGNOSIS — E669 Obesity, unspecified: Secondary | ICD-10-CM | POA: Diagnosis present

## 2020-02-01 DIAGNOSIS — E782 Mixed hyperlipidemia: Secondary | ICD-10-CM | POA: Diagnosis not present

## 2020-02-01 DIAGNOSIS — Z841 Family history of disorders of kidney and ureter: Secondary | ICD-10-CM

## 2020-02-01 DIAGNOSIS — I213 ST elevation (STEMI) myocardial infarction of unspecified site: Principal | ICD-10-CM | POA: Diagnosis present

## 2020-02-01 DIAGNOSIS — Z823 Family history of stroke: Secondary | ICD-10-CM | POA: Diagnosis not present

## 2020-02-01 DIAGNOSIS — I2510 Atherosclerotic heart disease of native coronary artery without angina pectoris: Secondary | ICD-10-CM | POA: Diagnosis not present

## 2020-02-01 DIAGNOSIS — R079 Chest pain, unspecified: Secondary | ICD-10-CM

## 2020-02-01 DIAGNOSIS — I214 Non-ST elevation (NSTEMI) myocardial infarction: Secondary | ICD-10-CM | POA: Diagnosis not present

## 2020-02-01 DIAGNOSIS — Z79899 Other long term (current) drug therapy: Secondary | ICD-10-CM | POA: Diagnosis not present

## 2020-02-01 DIAGNOSIS — Z6841 Body Mass Index (BMI) 40.0 and over, adult: Secondary | ICD-10-CM

## 2020-02-01 DIAGNOSIS — Z20822 Contact with and (suspected) exposure to covid-19: Secondary | ICD-10-CM | POA: Diagnosis not present

## 2020-02-01 DIAGNOSIS — E785 Hyperlipidemia, unspecified: Secondary | ICD-10-CM | POA: Diagnosis not present

## 2020-02-01 DIAGNOSIS — Z955 Presence of coronary angioplasty implant and graft: Secondary | ICD-10-CM

## 2020-02-01 DIAGNOSIS — I10 Essential (primary) hypertension: Secondary | ICD-10-CM | POA: Diagnosis present

## 2020-02-01 DIAGNOSIS — Z8249 Family history of ischemic heart disease and other diseases of the circulatory system: Secondary | ICD-10-CM

## 2020-02-01 DIAGNOSIS — R0602 Shortness of breath: Secondary | ICD-10-CM | POA: Diagnosis not present

## 2020-02-01 DIAGNOSIS — R0789 Other chest pain: Secondary | ICD-10-CM | POA: Diagnosis not present

## 2020-02-01 HISTORY — PX: CORONARY STENT INTERVENTION: CATH118234

## 2020-02-01 HISTORY — PX: LEFT HEART CATH AND CORONARY ANGIOGRAPHY: CATH118249

## 2020-02-01 HISTORY — PX: INTRAVASCULAR PRESSURE WIRE/FFR STUDY: CATH118243

## 2020-02-01 LAB — CBC WITH DIFFERENTIAL/PLATELET
Abs Immature Granulocytes: 0.03 K/uL (ref 0.00–0.07)
Basophils Absolute: 0 K/uL (ref 0.0–0.1)
Basophils Relative: 0 %
Eosinophils Absolute: 0.1 K/uL (ref 0.0–0.5)
Eosinophils Relative: 1 %
HCT: 39.6 % (ref 36.0–46.0)
Hemoglobin: 12.7 g/dL (ref 12.0–15.0)
Immature Granulocytes: 0 %
Lymphocytes Relative: 12 %
Lymphs Abs: 1.3 K/uL (ref 0.7–4.0)
MCH: 29.9 pg (ref 26.0–34.0)
MCHC: 32.1 g/dL (ref 30.0–36.0)
MCV: 93.2 fL (ref 80.0–100.0)
Monocytes Absolute: 0.6 K/uL (ref 0.1–1.0)
Monocytes Relative: 6 %
Neutro Abs: 8.2 K/uL — ABNORMAL HIGH (ref 1.7–7.7)
Neutrophils Relative %: 81 %
Platelets: 247 K/uL (ref 150–400)
RBC: 4.25 MIL/uL (ref 3.87–5.11)
RDW: 13.2 % (ref 11.5–15.5)
WBC: 10.3 K/uL (ref 4.0–10.5)
nRBC: 0 % (ref 0.0–0.2)

## 2020-02-01 LAB — TROPONIN I (HIGH SENSITIVITY)
Troponin I (High Sensitivity): 60 ng/L — ABNORMAL HIGH
Troponin I (High Sensitivity): 439 ng/L (ref <18)
Troponin I (High Sensitivity): 386 ng/L (ref <18)
Troponin I (High Sensitivity): 355 ng/L (ref <18)

## 2020-02-01 LAB — RESPIRATORY PANEL BY RT PCR (FLU A&B, COVID)
Influenza A by PCR: NEGATIVE
Influenza B by PCR: NEGATIVE
SARS Coronavirus 2 by RT PCR: NEGATIVE

## 2020-02-01 LAB — ECHOCARDIOGRAM COMPLETE
Height: 63 in
Weight: 4304 oz

## 2020-02-01 LAB — COMPREHENSIVE METABOLIC PANEL
ALT: 22 U/L (ref 0–44)
AST: 22 U/L (ref 15–41)
Albumin: 3.5 g/dL (ref 3.5–5.0)
Alkaline Phosphatase: 108 U/L (ref 38–126)
Anion gap: 9 (ref 5–15)
BUN: 15 mg/dL (ref 6–20)
CO2: 27 mmol/L (ref 22–32)
Calcium: 9 mg/dL (ref 8.9–10.3)
Chloride: 100 mmol/L (ref 98–111)
Creatinine, Ser: 0.71 mg/dL (ref 0.44–1.00)
GFR calc Af Amer: >60 mL/min (ref >60)
GFR calc non Af Amer: >60 mL/min (ref >60)
Glucose, Bld: 99 mg/dL (ref 70–99)
Potassium: 3.2 mmol/L — ABNORMAL LOW (ref 3.5–5.1)
Sodium: 136 mmol/L (ref 135–145)
Total Bilirubin: 0.9 mg/dL (ref 0.3–1.2)
Total Protein: 6.9 g/dL (ref 6.5–8.1)

## 2020-02-01 LAB — POCT ACTIVATED CLOTTING TIME
Activated Clotting Time: 164 seconds
Activated Clotting Time: 186 seconds
Activated Clotting Time: 478 seconds

## 2020-02-01 LAB — D-DIMER, QUANTITATIVE: D-Dimer, Quant: 0.27 ug{FEU}/mL (ref 0.00–0.50)

## 2020-02-01 SURGERY — LEFT HEART CATH AND CORONARY ANGIOGRAPHY
Anesthesia: LOCAL

## 2020-02-01 MED ORDER — AMLODIPINE BESYLATE 10 MG PO TABS
10.0000 mg | ORAL_TABLET | Freq: Every day | ORAL | Status: DC
  Administered 2020-02-01 – 2020-02-02 (×2): 10 mg via ORAL
  Filled 2020-02-01 (×2): qty 1

## 2020-02-01 MED ORDER — SODIUM CHLORIDE 0.9% FLUSH: 3.0000 mL | INTRAVENOUS | Status: DC | PRN

## 2020-02-01 MED ORDER — FENTANYL CITRATE (PF) 100 MCG/2ML IJ SOLN
INTRAMUSCULAR | Status: AC
  Filled 2020-02-01: qty 2

## 2020-02-01 MED ORDER — ONDANSETRON HCL 4 MG/2ML IJ SOLN: 4.0000 mg | Freq: Four times a day (QID) | INTRAMUSCULAR | Status: DC | PRN

## 2020-02-01 MED ORDER — HEPARIN (PORCINE) IN NACL 1000-0.9 UT/500ML-% IV SOLN
INTRAVENOUS | Status: DC | PRN
  Administered 2020-02-01 (×4): 500 mL

## 2020-02-01 MED ORDER — TICAGRELOR 90 MG PO TABS
90.0000 mg | ORAL_TABLET | Freq: Two times a day (BID) | ORAL | Status: DC
  Administered 2020-02-01 – 2020-02-02 (×2): 90 mg via ORAL
  Filled 2020-02-01 (×2): qty 1

## 2020-02-01 MED ORDER — SODIUM CHLORIDE 0.9% FLUSH
3.0000 mL | Freq: Two times a day (BID) | INTRAVENOUS | Status: DC
  Administered 2020-02-01: 3 mL via INTRAVENOUS

## 2020-02-01 MED ORDER — ATORVASTATIN CALCIUM 80 MG PO TABS
80.0000 mg | ORAL_TABLET | Freq: Every day | ORAL | Status: DC
  Administered 2020-02-01 – 2020-02-02 (×2): 80 mg via ORAL
  Filled 2020-02-01 (×2): qty 1

## 2020-02-01 MED ORDER — SODIUM CHLORIDE 0.9 % IV SOLN: 250.0000 mL | INTRAVENOUS | Status: DC | PRN

## 2020-02-01 MED ORDER — HEPARIN BOLUS VIA INFUSION
4000.0000 [IU] | Freq: Once | INTRAVENOUS | Status: AC
  Administered 2020-02-01: 4000 [IU] via INTRAVENOUS

## 2020-02-01 MED ORDER — POTASSIUM CHLORIDE CRYS ER 20 MEQ PO TBCR
40.0000 meq | EXTENDED_RELEASE_TABLET | Freq: Once | ORAL | Status: AC
  Administered 2020-02-01: 40 meq via ORAL

## 2020-02-01 MED ORDER — LIDOCAINE HCL (PF) 1 % IJ SOLN
INTRAMUSCULAR | Status: DC | PRN
  Administered 2020-02-01: 2 mL via INTRADERMAL
  Administered 2020-02-01: 30 mL via INTRADERMAL

## 2020-02-01 MED ORDER — IOHEXOL 350 MG/ML SOLN
INTRAVENOUS | Status: AC
  Filled 2020-02-01: qty 1

## 2020-02-01 MED ORDER — ACETAMINOPHEN 325 MG PO TABS
650.0000 mg | ORAL_TABLET | ORAL | Status: DC | PRN
  Administered 2020-02-01: 650 mg via ORAL
  Filled 2020-02-01: qty 2

## 2020-02-01 MED ORDER — NITROGLYCERIN IN D5W 200-5 MCG/ML-% IV SOLN
5.0000 ug/min | INTRAVENOUS | Status: DC
  Administered 2020-02-01: 5 ug/min via INTRAVENOUS
  Filled 2020-02-01: qty 250

## 2020-02-01 MED ORDER — LABETALOL HCL 200 MG PO TABS
200.0000 mg | ORAL_TABLET | Freq: Every day | ORAL | Status: DC
  Administered 2020-02-01: 200 mg via ORAL
  Filled 2020-02-01: qty 1

## 2020-02-01 MED ORDER — VERAPAMIL HCL 2.5 MG/ML IV SOLN
INTRAVENOUS | Status: AC
  Filled 2020-02-01: qty 2

## 2020-02-01 MED ORDER — MIDAZOLAM HCL 2 MG/2ML IJ SOLN
INTRAMUSCULAR | Status: AC
  Filled 2020-02-01: qty 2

## 2020-02-01 MED ORDER — TICAGRELOR 90 MG PO TABS
ORAL_TABLET | ORAL | Status: AC
  Filled 2020-02-01: qty 2

## 2020-02-01 MED ORDER — HEPARIN SODIUM (PORCINE) 1000 UNIT/ML IJ SOLN
INTRAMUSCULAR | Status: DC | PRN
  Administered 2020-02-01: 10000 [IU] via INTRAVENOUS
  Administered 2020-02-01: 12000 [IU] via INTRAVENOUS

## 2020-02-01 MED ORDER — FENTANYL CITRATE (PF) 100 MCG/2ML IJ SOLN
INTRAMUSCULAR | Status: DC | PRN
  Administered 2020-02-01: 25 ug via INTRAVENOUS
  Administered 2020-02-01: 50 ug via INTRAVENOUS

## 2020-02-01 MED ORDER — LISINOPRIL 10 MG PO TABS
10.0000 mg | ORAL_TABLET | Freq: Every day | ORAL | Status: DC
  Administered 2020-02-01: 10 mg via ORAL
  Filled 2020-02-01: qty 1

## 2020-02-01 MED ORDER — TICAGRELOR 90 MG PO TABS
ORAL_TABLET | ORAL | Status: DC | PRN
  Administered 2020-02-01: 180 mg via ORAL

## 2020-02-01 MED ORDER — IOHEXOL 350 MG/ML SOLN
INTRAVENOUS | Status: DC | PRN
  Administered 2020-02-01: 190 mL

## 2020-02-01 MED ORDER — HEPARIN (PORCINE) IN NACL 1000-0.9 UT/500ML-% IV SOLN
INTRAVENOUS | Status: AC
  Filled 2020-02-01: qty 1500

## 2020-02-01 MED ORDER — HEPARIN (PORCINE) 25000 UT/250ML-% IV SOLN: 1000.0000 [IU]/h | INTRAVENOUS | Status: DC

## 2020-02-01 MED ORDER — HEPARIN SODIUM (PORCINE) 1000 UNIT/ML IJ SOLN
INTRAMUSCULAR | Status: AC
  Filled 2020-02-01: qty 2

## 2020-02-01 MED ORDER — SODIUM CHLORIDE 0.9 % IV SOLN: INTRAVENOUS | Status: AC

## 2020-02-01 MED ORDER — MIDAZOLAM HCL 2 MG/2ML IJ SOLN
INTRAMUSCULAR | Status: DC | PRN
  Administered 2020-02-01: 1 mg via INTRAVENOUS
  Administered 2020-02-01: 2 mg via INTRAVENOUS

## 2020-02-01 MED ORDER — CLONIDINE HCL 0.3 MG PO TABS
0.3000 mg | ORAL_TABLET | Freq: Every day | ORAL | Status: DC
  Administered 2020-02-01: 0.3 mg via ORAL
  Filled 2020-02-01: qty 1

## 2020-02-01 MED ORDER — ASPIRIN 81 MG PO CHEW
81.0000 mg | CHEWABLE_TABLET | Freq: Every day | ORAL | Status: DC
  Administered 2020-02-02: 81 mg via ORAL
  Filled 2020-02-01: qty 1

## 2020-02-01 MED ORDER — HEPARIN (PORCINE) 25000 UT/250ML-% IV SOLN
INTRAVENOUS | Status: AC
  Administered 2020-02-01: 1000 [IU]/h via INTRAVENOUS
  Filled 2020-02-01: qty 250

## 2020-02-01 MED ORDER — HEPARIN SODIUM (PORCINE) 5000 UNIT/ML IJ SOLN: 4000.0000 [IU] | Freq: Once | INTRAMUSCULAR | Status: DC

## 2020-02-01 MED ORDER — MORPHINE SULFATE (PF) 4 MG/ML IV SOLN
4.0000 mg | Freq: Once | INTRAVENOUS | Status: AC
  Administered 2020-02-01: 4 mg via INTRAVENOUS
  Filled 2020-02-01: qty 1

## 2020-02-01 MED ORDER — HEPARIN SODIUM (PORCINE) 1000 UNIT/ML IJ SOLN
INTRAMUSCULAR | Status: AC
  Filled 2020-02-01: qty 1

## 2020-02-01 MED ORDER — HYDRALAZINE HCL 20 MG/ML IJ SOLN
10.0000 mg | INTRAMUSCULAR | Status: AC | PRN
  Filled 2020-02-01: qty 1

## 2020-02-01 MED ORDER — LABETALOL HCL 5 MG/ML IV SOLN: 10.0000 mg | INTRAVENOUS | Status: AC | PRN

## 2020-02-01 MED ORDER — LIDOCAINE HCL (PF) 1 % IJ SOLN
INTRAMUSCULAR | Status: AC
  Filled 2020-02-01: qty 30

## 2020-02-01 MED ORDER — TRIAMTERENE-HCTZ 75-50 MG PO TABS
1.0000 | ORAL_TABLET | Freq: Every day | ORAL | Status: DC
  Administered 2020-02-01 – 2020-02-02 (×2): 1 via ORAL
  Filled 2020-02-01 (×2): qty 1

## 2020-02-01 SURGICAL SUPPLY — 21 items
BALLN SAPPHIRE 2.0X15 (BALLOONS) ×2
BALLN SAPPHIRE ~~LOC~~ 3.75X12 (BALLOONS) ×1 IMPLANT
BALLOON SAPPHIRE 2.0X15 (BALLOONS) IMPLANT
CATH 5FR JL3.5 JR4 ANG PIG MP (CATHETERS) ×1 IMPLANT
CATH VISTA GUIDE 6FR XBLAD3.5 (CATHETERS) ×1 IMPLANT
CLOSURE MYNX CONTROL 6F/7F (Vascular Products) ×1 IMPLANT
GLIDESHEATH SLEND SS 6F .021 (SHEATH) ×1 IMPLANT
GUIDEWIRE INQWIRE 1.5J.035X260 (WIRE) IMPLANT
GUIDEWIRE PRESSURE COMET II (WIRE) ×1 IMPLANT
INQWIRE 1.5J .035X260CM (WIRE) ×2
KIT ENCORE 26 ADVANTAGE (KITS) ×1 IMPLANT
KIT HEART LEFT (KITS) ×2 IMPLANT
PACK CARDIAC CATHETERIZATION (CUSTOM PROCEDURE TRAY) ×2 IMPLANT
SHEATH PINNACLE 6F 10CM (SHEATH) ×1 IMPLANT
SHEATH PROBE COVER 6X72 (BAG) ×1 IMPLANT
STENT RESOLUTE ONYX 3.5X18 (Permanent Stent) ×1 IMPLANT
SYR MEDRAD MARK 7 150ML (SYRINGE) ×2 IMPLANT
TRANSDUCER W/STOPCOCK (MISCELLANEOUS) ×2 IMPLANT
TUBING CIL FLEX 10 FLL-RA (TUBING) ×2 IMPLANT
WIRE COUGAR XT STRL 190CM (WIRE) ×1 IMPLANT
WIRE EMERALD 3MM-J .035X150CM (WIRE) ×1 IMPLANT

## 2020-02-01 NOTE — Progress Notes (Signed)
  Echocardiogram 2D Echocardiogram has been performed.  [name] 02/01/2020, 3:35 PM

## 2020-02-01 NOTE — ED Notes (Signed)
Report given to Cath Lab at [facility]

## 2020-02-01 NOTE — ED Notes (Signed)
Pt OTF.

## 2020-02-01 NOTE — ED Notes (Addendum)
EMS gave 4 chewable [place] en route to hospital.

## 2020-02-01 NOTE — ED Provider Notes (Signed)
[facility] EMERGENCY DEPARTMENT Provider Note   CSN: [id number] Arrival date & time: 02/01/20  [id number]   Time seen 5:40 AM  History Chief Complaint  Patient presents with  . Chest Pain    [name] is a 49 y.o. female.  HPI   Patient states she was awakened from sleep about 3:30 in the morning with pain in the center of her chest that radiated into her left chest.  She states the pain has been there constantly and it waxes and wanes.  She describes as a tightness and a heaviness in her chest.  She states it made her feel short of breath.  She states she took 3 Tums with relief for only about 3 to 4 minutes and the pain got worse.  She went to get a bottle of water and drink that and then she had vomiting once.  She complains of nausea now.  She states she burped without relief.  She became sweaty and at this point her mother called EMS.  She states she is never had this happen before.  She denies any change in her activity.  She denies being under any new stress.  She denies smoking.  She does have hypertension but denies diabetes or high cholesterol.  She states her pain at its worse at home was 10 out of 10 and currently after EMS gave her 1 aspirin to chew and nitroglycerin once her pain is now a 8 out of 10.  She states her mother has had 3 MIs and bypass surgery.  She had her first heart attack when she was in her 58s.  PCP Fayrene Helper, MD   Past Medical History:  Diagnosis Date  . Facial numbness   . Fibroid   . Fx ankle   . Hypertension   . Obesity     Patient Active Problem List   Diagnosis Date Noted  . Left facial numbness 05/13/2019  . Closed avulsion fracture of ankle with routine healing 06/24/2018  . Pain in right ankle and joints of right foot 05/12/2018  . Dermatomycosis 01/05/2018  . IGT (impaired glucose tolerance) 06/03/2015  . Helicobacter pylori ab+ 10/29/2013  . GERD (gastroesophageal reflux disease) 10/27/2013  . History of snoring 10/27/2013    . Vitamin D deficiency 03/19/2012  . Morbid obesity ([place]) 01/20/2008  . Malignant hypertension 01/20/2008    Past Surgical History:  Procedure Laterality Date  . APPENDECTOMY    . APPLICATION OF CRANIAL NAVIGATION N/A 08/16/2019   Procedure: APPLICATION OF CRANIAL NAVIGATION;  Surgeon: Judith Part, MD;  Location: [place];  Service: Neurosurgery;  Laterality: N/A;  . bilateral breast reduction  2004  . CHOLECYSTECTOMY    . CRANIOTOMY Left 08/16/2019   Procedure: Left craniotomy for tumor resection;  Surgeon: Judith Part, MD;  Location: [place];  Service: Neurosurgery;  Laterality: Left;  Left craniotomy for tumor resection  . REDUCTION MAMMAPLASTY Bilateral   . removal of left ovarian cyst  2003  . TUBAL LIGATION  2011   Women's, removal of tubes 2013     OB History    Gravida  6   Para  4   Term      Preterm  4   AB  1   Living  4     SAB  1   TAB      Ectopic      Multiple      Live Births  1  Family History  Problem Relation Age of Onset  . Coronary artery disease Mother   . Hypertension Mother   . Coronary artery disease Father   . Kidney disease Father   . Hypertension Father   . Multiple sclerosis Sister   . Hypertension Sister   . Stroke Maternal Grandmother   . Heart attack Maternal Grandfather   . Anesthesia problems Neg Hx   . Hypotension Neg Hx   . Malignant hyperthermia Neg Hx   . Pseudochol deficiency Neg Hx     Social History   Tobacco Use  . Smoking status: Never Smoker  . Smokeless tobacco: Never Used  Substance Use Topics  . Alcohol use: Yes    Comment: rarely  . Drug use: No  employed as Freight forwarder at [employer] Medications Prior to Admission medications   Medication Sig Start Date End Date Taking? Authorizing Provider  amLODipine (NORVASC) 10 MG tablet Take 1 tablet (10 mg total) by mouth daily. 05/12/19   Fayrene Helper, MD  cloNIDine (CATAPRES) 0.3 MG tablet TAKE ONE TABLET BY  MOUTH AT BEDTIME 10/19/19   Fayrene Helper, MD  labetalol (NORMODYNE) 200 MG tablet TAKE ONE TABLET BY MOUTH ONCE DAILY EVERY MORNING 10/19/19   Fayrene Helper, MD  lisinopril (ZESTRIL) 10 MG tablet TAKE 1 TABLET BY MOUTH AT BEDTIME 01/13/20   Fayrene Helper, MD  meclizine (ANTIVERT) 25 MG tablet Take 1 tablet (25 mg total) by mouth 3 (three) times daily as needed for dizziness. 08/19/19   Judith Part, MD  triamterene-hydrochlorothiazide (MAXZIDE) 75-50 MG tablet Take 1 tablet by mouth daily. 05/12/19   Fayrene Helper, MD    Allergies    Patient has no known allergies.  Review of Systems   Review of Systems  All other systems reviewed and are negative.   Physical Exam Updated Vital Signs BP (!) 128/95   Pulse 79   Temp 97.8 F (36.6 C) (Oral)   Resp 17   Ht 5\' 3"  (1.6 m)   Wt 122 kg   LMP 01/11/2020   SpO2 100%   BMI 47.65 kg/m   Physical Exam Vitals and nursing note reviewed.  Constitutional:      General: She is not in acute distress.    Appearance: Normal appearance. She is well-developed. She is not ill-appearing or toxic-appearing.  HENT:     Head: Normocephalic and atraumatic.     Right Ear: External ear normal.     Left Ear: External ear normal.     Nose: Nose normal. No mucosal edema or rhinorrhea.     Mouth/Throat:     Dentition: No dental abscesses.     Pharynx: No uvula swelling.  Eyes:     Extraocular Movements: Extraocular movements intact.     Conjunctiva/sclera: Conjunctivae normal.     Pupils: Pupils are equal, round, and reactive to light.  Cardiovascular:     Rate and Rhythm: Normal rate and regular rhythm.     Heart sounds: Normal heart sounds. No murmur. No friction rub. No gallop.   Pulmonary:     Effort: Pulmonary effort is normal. No respiratory distress.     Breath sounds: Normal breath sounds. No wheezing, rhonchi or rales.  Chest:     Chest wall: Tenderness present. No crepitus.       Comments: Patient has  tenderness of the costochondral junctions on the right but more so on the left and her left anterior chest but she  states that feels different from the pain she was describing to me at home. Abdominal:     General: Bowel sounds are normal. There is no distension.     Palpations: Abdomen is soft.     Tenderness: There is no abdominal tenderness. There is no guarding or rebound.  Musculoskeletal:        General: No tenderness. Normal range of motion.     Cervical back: Full passive range of motion without pain, normal range of motion and neck supple.     Comments: Moves all extremities well.   Skin:    General: Skin is warm and dry.     Coloration: Skin is not pale.     Findings: No erythema or rash.  Neurological:     General: No focal deficit present.     Mental Status: She is alert and oriented to person, place, and time.     Cranial Nerves: No cranial nerve deficit.  Psychiatric:        Mood and Affect: Mood normal. Mood is not anxious.        Speech: Speech normal.        Behavior: Behavior normal.        Thought Content: Thought content normal.     ED Results / Procedures / Treatments   Labs (all labs ordered are listed, but only abnormal results are displayed) Results for orders placed or performed during the hospital encounter of 02/01/20  Comprehensive metabolic panel  Result Value Ref Range   Sodium 136 135 - 145 mmol/L   Potassium 3.2 (L) 3.5 - 5.1 mmol/L   Chloride 100 98 - 111 mmol/L   CO2 27 22 - 32 mmol/L   Glucose, Bld 99 70 - 99 mg/dL   BUN 15 6 - 20 mg/dL   Creatinine, Ser 0.71 0.44 - 1.00 mg/dL   Calcium 9.0 8.9 - 10.3 mg/dL   Total Protein 6.9 6.5 - 8.1 g/dL   Albumin 3.5 3.5 - 5.0 g/dL   AST 22 15 - 41 U/L   ALT 22 0 - 44 U/L   Alkaline Phosphatase 108 38 - 126 U/L   Total Bilirubin 0.9 0.3 - 1.2 mg/dL   GFR calc non Af Amer >60 >60 mL/min   GFR calc Af Amer >60 >60 mL/min   Anion gap 9 5 - 15  CBC with Differential  Result Value Ref Range   WBC  10.3 4.0 - 10.5 K/uL   RBC 4.25 3.87 - 5.11 MIL/uL   Hemoglobin 12.7 12.0 - 15.0 g/dL   HCT 39.6 36.0 - 46.0 %   MCV 93.2 80.0 - 100.0 fL   MCH 29.9 26.0 - 34.0 pg   MCHC 32.1 30.0 - 36.0 g/dL   RDW 13.2 11.5 - 15.5 %   Platelets 247 150 - 400 K/uL   nRBC 0.0 0.0 - 0.2 %   Neutrophils Relative % 81 %   Neutro Abs 8.2 (H) 1.7 - 7.7 K/uL   Lymphocytes Relative 12 %   Lymphs Abs 1.3 0.7 - 4.0 K/uL   Monocytes Relative 6 %   Monocytes Absolute 0.6 0.1 - 1.0 K/uL   Eosinophils Relative 1 %   Eosinophils Absolute 0.1 0.0 - 0.5 K/uL   Basophils Relative 0 %   Basophils Absolute 0.0 0.0 - 0.1 K/uL   Immature Granulocytes 0 %   Abs Immature Granulocytes 0.03 0.00 - 0.07 K/uL  D-dimer, quantitative  Result Value Ref Range   D-Dimer, Quant 0.27  0.00 - 0.50 ug/mL-FEU  Troponin I (High Sensitivity)  Result Value Ref Range   Troponin I (High Sensitivity) 60 (H) <18 ng/L   Laboratory interpretation all normal except elevation of her first troponin    EKG EKG Interpretation  Date/Time:  Wednesday February 01 2020 05:47:38 EDT Ventricular Rate:  82 PR Interval:    QRS Duration: 84 QT Interval:  385 QTC Calculation: 450 R Axis:   24 Text Interpretation: Sinus rhythm Prolonged PR interval Abnormal R-wave progression, early transition Minimal ST elevation, inferior leads Baseline wander in lead(s) V1 V3 V4 V5 V6 Confirmed by Rolland Porter [phone]) on 02/01/2020 6:15:35 AM   EKG Interpretation  Date/Time:  Wednesday February 01 2020 05:48:59 EDT Ventricular Rate:  80 PR Interval:    QRS Duration: 86 QT Interval:  379 QTC Calculation: 438 R Axis:   25 Text Interpretation: Sinus rhythm Prolonged PR interval Abnormal R-wave progression, early transition Minimal ST elevation, inferior leads and lateral leads Confirmed by Rolland Porter [phone]) on 02/01/2020 6:16:29 AM       Radiology No results found.  Procedures .Critical Care Performed by: Rolland Porter, MD Authorized by: Rolland Porter, MD    Critical care provider statement:    Critical care time (minutes):  39   Critical care was necessary to treat or prevent imminent or life-threatening deterioration of the following conditions:  Cardiac failure   Critical care was time spent personally by me on the following activities:  Discussions with consultants, examination of patient, obtaining history from patient or surrogate, ordering and review of laboratory studies, ordering and review of radiographic studies and re-evaluation of patient's condition   (including critical care time)  Medications Ordered in ED Medications  nitroGLYCERIN 50 mg in dextrose 5 % 250 mL (0.2 mg/mL) infusion (15 mcg/min Intravenous Rate/Dose Change 02/01/20 0704)  heparin ADULT infusion 100 units/mL (25000 units/282mL sodium chloride 0.45%) (has no administration in time range)  morphine 4 MG/ML injection 4 mg (has no administration in time range)  heparin injection 4,000 Units (4,000 Units Intravenous Not Given 02/01/20 0726)  heparin bolus via infusion 4,000 Units (4,000 Units Intravenous Bolus from Bag 02/01/20 0728)    ED Course  I have reviewed the triage vital signs and the nursing notes.  Pertinent labs & imaging results that were available during my care of the patient were reviewed by me and considered in my medical decision making (see chart for details).    MDM Rules/Calculators/A&P                      Patient already received aspirin by EMS.  She was started on a nitroglycerin drip.  Laboratory testing was done to check her for cardiac event, or possibly PE.  Her exam is consistent with chest wall pain but she states that is different from the pain she was describing to me.  Recheck at 6:54 AM patient states her pain is improving, is currently now a 3-4 on a scale of 10.  6:45 AM patient was discussed with Dr. Tamala Julian, cardiologist.  He is any scarring cannot look at her EKG.  He will call me back shortly.  He states that she continues to  have pain she should probably come down to [facility] for them to evaluate.  Patient's first troponin is positive, she was started on heparin bolus and drip.  7:17 AM Dr. Angelena Form, cardiology called and we discussed patient. He wants to make her a STEMI and have her come  by EMS to the Cath Lab. He states to give her a 4000 bolus of heparin and have EMS transport.  7:19 AM patient states her pain is a 3 out of 10, but states it's improving. She is on the nitroglycerin drip.    Final Clinical Impression(s) / ED Diagnoses Final diagnoses:  ST elevation myocardial infarction (STEMI), unspecified artery ([place])    Rx / DC Orders  Plan transfer to [facility] Cath Lab   Rolland Porter, MD, Barbette Or, MD 02/01/20 [phone]

## 2020-02-01 NOTE — Progress Notes (Signed)
Patient transferred from cath lab at 1200hrs.  Oriented to unit and plan of care for shift.  Given post cath instructions, verbalized understanding.  R femoral site level zero, dressing in place. Sister at bedside.

## 2020-02-01 NOTE — Progress Notes (Signed)
CRITICAL VALUE ALERT  Critical Value:  Troponin 439   Date & Time Notied:  02/01/20 at 1440hrs  Provider Notified: Mancel Bale, NP  Orders Received/Actions taken: Continue to monitor.

## 2020-02-01 NOTE — ED Triage Notes (Signed)
Pt from home via [facility]. Pt C/O mid sternal chest pain that woke her from sleep at [place].

## 2020-02-01 NOTE — H&P (Addendum)
Cardiology Admission History and Physical:   Patient ID: [name] MRN: [id number]; DOB: [birth year]   Admission date: 02/01/2020  Primary Care Provider: Fayrene Helper, MD Primary Cardiologist: No primary care provider on file. New  Chief Complaint:  Chest pain  Patient Profile:   [name] is a 49 y.o. female with history of HTN, obesity who began having chest pain early this morning. The took 3 tums with no relief. She presented to the [facility] ED via EMS. Initial EKG with no evidence of ST elevation. She continued to have chest pain. Serial EKGs with subtle inferior ST elevation. Troponin 60. Code STEMI activated at 7:20 am and transport arranged to [facility].   History of Present Illness:    [name] is a 49 y.o. female with history of HTN, obesity who began having chest pain early this morning. The took 3 tums with no relief. She presented to the [facility] ED via EMS. Initial EKG with no evidence of ST elevation. She continued to have chest pain. Serial EKGs with subtle inferior ST elevation. Troponin 60. Code STEMI activated at 7:20 am and transport arranged to [facility]. Pt with ongoing chest pain upon arrival to [facility].     Past Medical History:  Diagnosis Date  . Facial numbness   . Fibroid   . Fx ankle   . Hypertension   . Obesity     Past Surgical History:  Procedure Laterality Date  . APPENDECTOMY    . APPLICATION OF CRANIAL NAVIGATION N/A 08/16/2019   Procedure: APPLICATION OF CRANIAL NAVIGATION;  Surgeon: Judith Part, MD;  Location: [place];  Service: Neurosurgery;  Laterality: N/A;  . bilateral breast reduction  2004  . CHOLECYSTECTOMY    . CRANIOTOMY Left 08/16/2019   Procedure: Left craniotomy for tumor resection;  Surgeon: Judith Part, MD;  Location: [place];  Service: Neurosurgery;  Laterality: Left;  Left craniotomy for tumor resection  . REDUCTION MAMMAPLASTY Bilateral   . removal of left ovarian cyst  2003  . TUBAL LIGATION  2011   Women's, removal of tubes 2013     Medications Prior to Admission: Prior to Admission medications   Medication Sig Start Date End Date Taking? Authorizing Provider  amLODipine (NORVASC) 10 MG tablet Take 1 tablet (10 mg total) by mouth daily. 05/12/19   Fayrene Helper, MD  cloNIDine (CATAPRES) 0.3 MG tablet TAKE ONE TABLET BY MOUTH AT BEDTIME 10/19/19   Fayrene Helper, MD  labetalol (NORMODYNE) 200 MG tablet TAKE ONE TABLET BY MOUTH ONCE DAILY EVERY MORNING 10/19/19   Fayrene Helper, MD  lisinopril (ZESTRIL) 10 MG tablet TAKE 1 TABLET BY MOUTH AT BEDTIME 01/13/20   Fayrene Helper, MD  meclizine (ANTIVERT) 25 MG tablet Take 1 tablet (25 mg total) by mouth 3 (three) times daily as needed for dizziness. 08/19/19   Judith Part, MD  triamterene-hydrochlorothiazide (MAXZIDE) 75-50 MG tablet Take 1 tablet by mouth daily. 05/12/19   Fayrene Helper, MD     Allergies:   No Known Allergies  Social History:   Social History   Socioeconomic History  . Marital status: Single    Spouse name: Not on file  . Number of children: 4  . Years of education: Not on file  . Highest education level: Associate degree: occupational, Hotel manager, or vocational program  Occupational History  . Not on file  Tobacco Use  . Smoking status: Never Smoker  . Smokeless tobacco: Never Used  Substance and Sexual Activity  . Alcohol use: Yes    Comment: rarely  . Drug use: No  . Sexual activity: Yes    Birth control/protection: Abstinence, Surgical  Other Topics Concern  . Not on file  Social History Narrative   Lives with children   Caffeine- sodas, 48 oz daily   Social Determinants of Health   Financial Resource Strain:   . Difficulty of Paying Living Expenses:   Food Insecurity:   . Worried About Charity fundraiser in the Last Year:   . Arboriculturist in the Last Year:   Transportation Needs:   . Film/video editor (Medical):   Marland Kitchen Lack of Transportation (Non-Medical):     Physical Activity:   . Days of Exercise per Week:   . Minutes of Exercise per Session:   Stress:   . Feeling of Stress :   Social Connections:   . Frequency of Communication with Friends and Family:   . Frequency of Social Gatherings with Friends and Family:   . Attends Religious Services:   . Active Member of Clubs or Organizations:   . Attends Archivist Meetings:   Marland Kitchen Marital Status:   Intimate Partner Violence:   . Fear of Current or Ex-Partner:   . Emotionally Abused:   Marland Kitchen Physically Abused:   . Sexually Abused:     Family History:   The patient's family history includes Coronary artery disease in her father and mother; Heart attack in her maternal grandfather; Hypertension in her father, mother, and sister; Kidney disease in her father; Multiple sclerosis in her sister; Stroke in her maternal grandmother. There is no history of Anesthesia problems, Hypotension, Malignant hyperthermia, or Pseudochol deficiency.    ROS:  Please see the history of present illness.  All other ROS reviewed and negative.     Physical Exam/Data:   Vitals:   02/01/20 0700 02/01/20 0710 02/01/20 0720 02/01/20 0730  BP: (!) 128/95 (!) 146/85 (!) 169/116   Pulse:  82 76 80  Resp: 17 16 17 18   Temp:      TempSrc:      SpO2:  100% 100% 100%  Weight:      Height:       No intake or output data in the 24 hours ending 02/01/20 0824 Last 3 Weights 02/01/2020 10/19/2019 08/16/2019  Weight (lbs) 269 lb 269 lb 278 lb  Weight (kg) 122.018 kg 122.018 kg 126.1 kg     Body mass index is 47.65 kg/m.  General:  Well nourished, well developed, in no acute distress HEENT: normal Lymph: no adenopathy Neck: no JVD Endocrine:  No thryomegaly Vascular: No carotid bruits; FA pulses 2+ bilaterally without bruits  Cardiac:  normal S1, S2; RRR; no murmur  Lungs:  clear to auscultation bilaterally, no wheezing, rhonchi or rales  Abd: soft, nontender, no hepatomegaly  Ext: no LE edema Musculoskeletal:   No deformities, BUE and BLE strength normal and equal Skin: warm and dry  Neuro:  CNs 2-12 intact, no focal abnormalities noted Psych:  Normal affect    EKG:  The ECG that was done  was personally reviewed and demonstrates sinus with 1 mm ST elevation in the inferior leads.   Relevant CV Studies:   Laboratory Data:  High Sensitivity Troponin:   Recent Labs  Lab 02/01/20 0619  TROPONINIHS 60*      Chemistry Recent Labs  Lab 02/01/20 0619  NA 136  K 3.2*  CL 100  CO2 27  GLUCOSE 99  BUN 15  CREATININE 0.71  CALCIUM 9.0  GFRNONAA >60  GFRAA >60  ANIONGAP 9    Recent Labs  Lab 02/01/20 0619  PROT 6.9  ALBUMIN 3.5  AST 22  ALT 22  ALKPHOS 108  BILITOT 0.9   Hematology Recent Labs  Lab 02/01/20 0619  WBC 10.3  RBC 4.25  HGB 12.7  HCT 39.6  MCV 93.2  MCH 29.9  MCHC 32.1  RDW 13.2  PLT 247   BNPNo results for input(s): BNP, PROBNP in the last 168 hours.  DDimer  Recent Labs  Lab 02/01/20 [id number]  DDIMER 0.27     Radiology/Studies:  No results found.   Assessment and Plan:   1. Acute coronary syndrome/NSTEMI:  EKG with subtle inferior elevation. Ongoing chest pain. Elevated troponin. Will plan emergent cath.   Severity of Illness: The appropriate patient status for this patient is INPATIENT. Inpatient status is judged to be reasonable and necessary in order to provide the required intensity of service to ensure the patient's safety. The patient's presenting symptoms, physical exam findings, and initial radiographic and laboratory data in the context of their chronic comorbidities is felt to place them at high risk for further clinical deterioration. Furthermore, it is not anticipated that the patient will be medically stable for discharge from the hospital within 2 midnights of admission. The following factors support the patient status of inpatient.   " The patient's presenting symptoms include chest pain. " The worrisome physical exam findings  include  " The initial radiographic and laboratory data are worrisome because of elevated troponin " The chronic co-morbidities include HTN, obesity   * I certify that at the point of admission it is my clinical judgment that the patient will require inpatient hospital care spanning beyond 2 midnights from the point of admission due to high intensity of service, high risk for further deterioration and high frequency of surveillance required.*    For questions or updates, please contact [place] Please consult [email or web address] for contact info under        Signed, [name], MD  02/01/2020 8:24 AM

## 2020-02-01 NOTE — Progress Notes (Signed)
ANTICOAGULATION CONSULT NOTE - Initial Consult  Pharmacy Consult for Heparin Indication: chest pain/ACS  No Known Allergies  Patient Measurements: Height: 5\' 3"  (160 cm) Weight: 122 kg (269 lb) IBW/kg (Calculated) : 52.4 Heparin Dosing Weight: 82.5 kg  Vital Signs: Temp: 97.8 F (36.6 C) (04/21 0519) Temp Source: Oral (04/21 0519) BP: 128/95 (04/21 0700) Pulse Rate: 79 (04/21 0650)  Labs: Recent Labs    02/01/20 0619  HGB 12.7  HCT 39.6  PLT 247  CREATININE 0.71  TROPONINIHS 60*    Estimated Creatinine Clearance: 110.1 mL/min (by C-G formula based on SCr of 0.71 mg/dL).   Medical History: Past Medical History:  Diagnosis Date  . Facial numbness   . Fibroid   . Fx ankle   . Hypertension   . Obesity     Assessment: 49 year old who was brought by EMS to [facility] with chest pain. Pharmacy consulted to start IV Heparin. Patient was not on anticoagulants prior to admission.   CBC is within normal limits. D-dimer 0.27. SCr within normal limits.   Goal of Therapy:  Heparin level 0.3-0.7 units/ml Monitor platelets by anticoagulation protocol: Yes   Plan:  Heparin bolus of 4000 units then Heparin drip at 1000 units/hr.  Heparin level in 6 hours. Daily Heparin level and CBC while on therapy.   Sloan Leiter, PharmD, BCPS, BCCCP Clinical Pharmacist Please refer to [facility] for [place] numbers 02/01/2020,7:18 AM

## 2020-02-02 ENCOUNTER — Telehealth: Payer: Self-pay | Admitting: Cardiovascular Disease

## 2020-02-02 ENCOUNTER — Telehealth: Payer: Self-pay | Admitting: Family Medicine

## 2020-02-02 DIAGNOSIS — E782 Mixed hyperlipidemia: Secondary | ICD-10-CM

## 2020-02-02 DIAGNOSIS — I10 Essential (primary) hypertension: Secondary | ICD-10-CM | POA: Diagnosis not present

## 2020-02-02 DIAGNOSIS — E785 Hyperlipidemia, unspecified: Secondary | ICD-10-CM

## 2020-02-02 DIAGNOSIS — I214 Non-ST elevation (NSTEMI) myocardial infarction: Secondary | ICD-10-CM | POA: Diagnosis not present

## 2020-02-02 LAB — BASIC METABOLIC PANEL
Anion gap: 13 (ref 5–15)
BUN: 11 mg/dL (ref 6–20)
CO2: 20 mmol/L — ABNORMAL LOW (ref 22–32)
Calcium: 8.7 mg/dL — ABNORMAL LOW (ref 8.9–10.3)
Chloride: 100 mmol/L (ref 98–111)
Creatinine, Ser: 0.64 mg/dL (ref 0.44–1.00)
GFR calc Af Amer: >60 mL/min (ref >60)
GFR calc non Af Amer: >60 mL/min (ref >60)
Glucose, Bld: 101 mg/dL — ABNORMAL HIGH (ref 70–99)
Potassium: 3.7 mmol/L (ref 3.5–5.1)
Sodium: 133 mmol/L — ABNORMAL LOW (ref 135–145)

## 2020-02-02 LAB — HEMOGLOBIN A1C
Hgb A1c MFr Bld: 5 % (ref 4.8–5.6)
Mean Plasma Glucose: 96.8 mg/dL

## 2020-02-02 LAB — CBC
HCT: 36.2 % (ref 36.0–46.0)
Hemoglobin: 11.9 g/dL — ABNORMAL LOW (ref 12.0–15.0)
MCH: 29.9 pg (ref 26.0–34.0)
MCHC: 32.9 g/dL (ref 30.0–36.0)
MCV: 91 fL (ref 80.0–100.0)
Platelets: 233 10*3/uL (ref 150–400)
RBC: 3.98 MIL/uL (ref 3.87–5.11)
RDW: 13.2 % (ref 11.5–15.5)
WBC: 11.1 10*3/uL — ABNORMAL HIGH (ref 4.0–10.5)
nRBC: 0 % (ref 0.0–0.2)

## 2020-02-02 LAB — LIPID PANEL
Cholesterol: 162 mg/dL (ref 0–200)
HDL: 55 mg/dL (ref >40)
LDL Cholesterol: 92 mg/dL (ref 0–99)
Total CHOL/HDL Ratio: 2.9 RATIO
Triglycerides: 75 mg/dL (ref <150)
VLDL: 15 mg/dL (ref 0–40)

## 2020-02-02 MED ORDER — ASPIRIN 81 MG PO CHEW: 81.0000 mg | CHEWABLE_TABLET | Freq: Every day | ORAL | 1 refills | Status: AC

## 2020-02-02 MED ORDER — TICAGRELOR 90 MG PO TABS: 90.0000 mg | ORAL_TABLET | Freq: Two times a day (BID) | ORAL | 2 refills | Status: DC

## 2020-02-02 MED ORDER — ATORVASTATIN CALCIUM 80 MG PO TABS: 80.0000 mg | ORAL_TABLET | Freq: Every day | ORAL | 1 refills | Status: DC

## 2020-02-02 MED FILL — ASPIRIN LOW DOSE 81 MG CHEW: 81 | 90 days supply | Qty: 90 | Fill #0

## 2020-02-02 MED FILL — BRILINTA 90 MG TABLET: 90 | 30 days supply | Qty: 60 | Fill #0

## 2020-02-02 MED FILL — ATORVASTATIN CALCIUM 80 MG: 80 | 30 days supply | Qty: 30 | Fill #0

## 2020-02-02 MED FILL — Verapamil HCl IV Soln 2.5 MG/ML: INTRAVENOUS | Qty: 2 | Status: AC

## 2020-02-02 NOTE — Discharge Summary (Addendum)
The patient has been seen in conjunction with Reino Bellis, NP. All aspects of care have been considered and discussed. The patient has been personally interviewed, examined, and all clinical data has been reviewed.  Extensive conversation concerning her disease process. She understands she has a single stent and had a small myocardial infarction. Preventive therapy to include high intensity statin, Right femoral access site is sore but there is no hematoma palpable.  Distal pulses are 2+ and symmetric. Kidney function is stable. Advised abstinence from work until after [place].  She looks forward to a vacation 7 days from now.  Will probably be okay to extend.   Discharge Summary    Patient ID: [name],  MRN: [id number], DOB/AGE: [birth year] 49 y.o.  Admit date: 02/01/2020 Discharge date: 02/02/2020  Primary Care Provider: Fayrene Helper Primary Cardiologist: [name], MD  Discharge Diagnoses    Principal Problem:   NSTEMI (non-ST elevated myocardial infarction) [facility]) Active Problems:   Malignant hypertension   Non-ST elevation (NSTEMI) myocardial infarction [facility])   Hyperlipidemia   Allergies No Known Allergies  Diagnostic Studies/Procedures    Cath: 02/01/20  Mid LAD lesion is 80% stenosed. A drug-eluting stent was successfully placed using a STENT RESOLUTE ONYX 3.5X18. Post intervention, there is a 0% residual stenosis. The left ventricular systolic function is normal. LV end diastolic pressure is normal. The left ventricular ejection fraction is 55-65% by visual estimate. There is no mitral valve regurgitation.   1. Severe stenosis mid LAD. Flow limiting by DFR 0.86.  2. Successful PTCA/DES x 1 mid LAD 3. No obstructive disease in the RCA or Circumflex 4. Normal LV systolic function   Recommendations: Continue DAPT for one year with ASA and Brilinta. Will start high intensity statin. Continue beta blocker. Echo later today.   Diagnostic  Dominance: Right  Intervention    Echo: 02/01/20  IMPRESSIONS     1. Left ventricular ejection fraction, by estimation, is 55 to 60%. The  left ventricle has normal function. The left ventricle has no regional  wall motion abnormalities. There is moderate left ventricular hypertrophy.  Left ventricular diastolic  parameters are consistent with Grade I diastolic dysfunction (impaired  relaxation).   2. Right ventricular systolic function is normal. The right ventricular  size is normal. Tricuspid regurgitation signal is inadequate for assessing  PA pressure.   3. The mitral valve is grossly normal. No evidence of mitral valve  regurgitation. No evidence of mitral stenosis.   4. The aortic valve is tricuspid. Aortic valve regurgitation is not  visualized. No aortic stenosis is present.   5. The inferior vena cava is normal in size with greater than 50%  respiratory variability, suggesting right atrial pressure of 3 mmHg.  _____________   History of Present Illness     [name] is a 49 y.o. female with history of HTN, obesity who began having chest pain early the morning of admission. She took 3 tums with no relief. She presented to the [facility] ED via EMS. Initial EKG with no evidence of ST elevation. She continued to have chest pain. Serial EKGs with subtle inferior ST elevation. Troponin 60. Code STEMI activated at 7:20 am and transport arranged to [facility]. Pt with ongoing chest pain upon arrival to [facility].   Hospital Course     Underwent cardiac cath noted above with mLAD lesion of 80% which was flow limiting by DFR of 0.86. Successful PTCA/DES x1 to the mLAD. No obstructive disease  in the RCA or LCx. Placed on DAPT with ASA/Brilinta post cath for at least one year. hsTn peaked at 439. Placed on high dose statin. Lipid panel/Hgb A1c was obtained prior to discharge with results pending. Follow up echo showed normal EF with no rWMA, moderate LVH, and G1DD. No further chest pain post  cath. Worked well with cardiac rehab.   General: Well developed, well nourished, female appearing in no acute distress. Head: Normocephalic, atraumatic.  Neck: Supple without bruits, JVD. Lungs:  Resp regular and unlabored, CTA. Heart: RRR, S1, S2, no S3, S4, or murmur; no rub. Abdomen: Soft, non-tender, non-distended with normoactive bowel sounds. No hepatomegaly. No rebound/guarding. No obvious abdominal masses. Extremities: No clubbing, cyanosis, edema. Distal pedal pulses are 2+ bilaterally. Right cath site stable without bruising or hematoma Neuro: Alert and oriented X 3. Moves all extremities spontaneously. Psych: Normal affect.  [name] was seen by Dr. Tamala Julian and determined stable for discharge home. Follow up in the office has been arranged. Medications are listed below.   _____________  Discharge Vitals Blood pressure 119/83, pulse 85, temperature 99.1 F (37.3 C), temperature source Oral, resp. rate 20, height 5\' 3"  (1.6 m), weight 122 kg, last menstrual period 01/11/2020, SpO2 98 %.  Filed Weights   02/01/20 0520  Weight: 122 kg    Labs & Radiologic Studies    CBC Recent Labs    02/01/20 0619 02/02/20 0447  WBC 10.3 11.1*  NEUTROABS 8.2*  --   HGB 12.7 11.9*  HCT 39.6 36.2  MCV 93.2 91.0  PLT 247 0000000   Basic Metabolic Panel Recent Labs    02/01/20 0619 02/02/20 0447  NA 136 133*  K 3.2* 3.7  CL 100 100  CO2 27 20*  GLUCOSE 99 101*  BUN 15 11  CREATININE 0.71 0.64  CALCIUM 9.0 8.7*   Liver Function Tests Recent Labs    02/01/20 0619  AST 22  ALT 22  ALKPHOS 108  BILITOT 0.9  PROT 6.9  ALBUMIN 3.5   No results for input(s): LIPASE, AMYLASE in the last 72 hours. Cardiac Enzymes No results for input(s): CKTOTAL, CKMB, CKMBINDEX, TROPONINI in the last 72 hours. BNP Invalid input(s): POCBNP D-Dimer Recent Labs    02/01/20 0619  DDIMER 0.27   Hemoglobin A1C Recent Labs    02/02/20 0835  HGBA1C 5.0   Fasting Lipid Panel No results  for input(s): CHOL, HDL, LDLCALC, TRIG, CHOLHDL, LDLDIRECT in the last 72 hours. Thyroid Function Tests No results for input(s): TSH, T4TOTAL, T3FREE, THYROIDAB in the last 72 hours.  Invalid input(s): FREET3 _____________  CARDIAC CATHETERIZATION  Result Date: 02/02/2020  Mid LAD lesion is 80% stenosed.  A drug-eluting stent was successfully placed using a STENT RESOLUTE ONYX 3.5X18.  Post intervention, there is a 0% residual stenosis.  The left ventricular systolic function is normal.  LV end diastolic pressure is normal.  The left ventricular ejection fraction is 55-65% by visual estimate.  There is no mitral valve regurgitation.  1. Severe stenosis mid LAD. Flow limiting by DFR 0.86. 2. Successful PTCA/DES x 1 mid LAD 3. No obstructive disease in the RCA or Circumflex 4. Normal LV systolic function Recommendations: Continue DAPT for one year with ASA and Brilinta. Will start high intensity statin. Continue beta blocker. Echo later today.   ECHOCARDIOGRAM COMPLETE  Result Date: 02/01/2020    ECHOCARDIOGRAM REPORT   Patient Name:   [name] Date of Exam: 02/01/2020 Medical Rec #:  [id number]  Height:       63.0 in Accession #:    [id number]  Weight:       269.0 lb Date of Birth:  03/17/1971   BSA:          2.193 m Patient Age:    64 years    BP:           140/114 mmHg Patient Gender: F           HR:           88 bpm. Exam Location:  Inpatient Procedure: 2D Echo, Cardiac Doppler and Color Doppler Indications:    CAD  History:        Patient has prior history of Echocardiogram examinations, most                 recent 12/19/2013. Acute MI and CAD, Obesity, Signs/Symptoms:Chest                 Pain; Risk Factors:Hypertension.  Sonographer:    Dustin Flock Referring Phys: Marion Comments: Patient is morbidly obese. Lying flat after cath. IMPRESSIONS  1. Left ventricular ejection fraction, by estimation, is 55 to 60%. The left ventricle has normal function. The  left ventricle has no regional wall motion abnormalities. There is moderate left ventricular hypertrophy. Left ventricular diastolic parameters are consistent with Grade I diastolic dysfunction (impaired relaxation).  2. Right ventricular systolic function is normal. The right ventricular size is normal. Tricuspid regurgitation signal is inadequate for assessing PA pressure.  3. The mitral valve is grossly normal. No evidence of mitral valve regurgitation. No evidence of mitral stenosis.  4. The aortic valve is tricuspid. Aortic valve regurgitation is not visualized. No aortic stenosis is present.  5. The inferior vena cava is normal in size with greater than 50% respiratory variability, suggesting right atrial pressure of 3 mmHg. FINDINGS  Left Ventricle: Left ventricular ejection fraction, by estimation, is 55 to 60%. The left ventricle has normal function. The left ventricle has no regional wall motion abnormalities. The left ventricular internal cavity size was normal in size. There is  moderate left ventricular hypertrophy. Left ventricular diastolic parameters are consistent with Grade I diastolic dysfunction (impaired relaxation). Right Ventricle: The right ventricular size is normal. No increase in right ventricular wall thickness. Right ventricular systolic function is normal. Tricuspid regurgitation signal is inadequate for assessing PA pressure. Left Atrium: Left atrial size was normal in size. Right Atrium: Right atrial size was normal in size. Pericardium: Trivial pericardial effusion is present. Presence of pericardial fat pad. Mitral Valve: The mitral valve is grossly normal. No evidence of mitral valve regurgitation. No evidence of mitral valve stenosis. Tricuspid Valve: The tricuspid valve is grossly normal. Tricuspid valve regurgitation is not demonstrated. No evidence of tricuspid stenosis. Aortic Valve: The aortic valve is tricuspid. Aortic valve regurgitation is not visualized. No aortic  stenosis is present. Pulmonic Valve: The pulmonic valve was grossly normal. Pulmonic valve regurgitation is not visualized. No evidence of pulmonic stenosis. Aorta: The aortic root is normal in size and structure. Venous: The inferior vena cava is normal in size with greater than 50% respiratory variability, suggesting right atrial pressure of 3 mmHg. IAS/Shunts: The atrial septum is grossly normal.  LEFT VENTRICLE PLAX 2D LVIDd:         3.80 cm  Diastology LVIDs:         2.70 cm  LV e' lateral:   5.77 cm/s LV PW:  1.40 cm  LV E/e' lateral: 17.9 LV IVS:        1.40 cm  LV e' medial:    8.27 cm/s LVOT diam:     2.20 cm  LV E/e' medial:  12.5 LV SV:         114 LV SV Index:   52 LVOT Area:     3.80 cm  RIGHT VENTRICLE RV Basal diam:  2.90 cm RV S prime:     6.74 cm/s TAPSE (M-mode): 2.5 cm LEFT ATRIUM             Index       RIGHT ATRIUM           Index LA diam:        3.60 cm 1.64 cm/m  RA Area:     16.90 cm LA Vol (A2C):   46.2 ml 21.06 ml/m RA Volume:   41.10 ml  18.74 ml/m LA Vol (A4C):   58.4 ml 26.63 ml/m LA Biplane Vol: 53.9 ml 24.58 ml/m  AORTIC VALVE LVOT Vmax:   133.00 cm/s LVOT Vmean:  88.700 cm/s LVOT VTI:    0.301 m  AORTA Ao Root diam: 3.40 cm MITRAL VALVE MV Area (PHT): 5.54 cm     SHUNTS MV Decel Time: 137 msec     Systemic VTI:  0.30 m MV E velocity: 103.00 cm/s  Systemic Diam: 2.20 cm MV A velocity: 116.00 cm/s MV E/A ratio:  0.89 Eleonore Chiquito MD Electronically signed by Eleonore Chiquito MD Signature Date/Time: 02/01/2020/6:13:33 PM    Final    Disposition   Pt is being discharged home today in good condition.  Follow-up Plans & Appointments    Follow-up Information     [place], Crista Luria, [place] Follow up on 02/10/2020.   Specialty: Cardiology Why: at 11:15am for your follow up appt.  Contact information: [place] [address] [phone]           Discharge Instructions     Amb Referral to Cardiac Rehabilitation   Complete by: As directed     Diagnosis:  Coronary Stents STEMI PTCA     After initial evaluation and assessments completed: Virtual Based Care may be provided alone or in conjunction with Phase 2 Cardiac Rehab based on patient barriers.: Yes   Call MD for:  redness, tenderness, or signs of infection (pain, swelling, redness, odor or green/yellow discharge around incision site)   Complete by: As directed    Diet - low sodium heart healthy   Complete by: As directed    Discharge instructions   Complete by: As directed    Radial Site Care Refer to this sheet in the next few weeks. These instructions provide you with information on caring for yourself after your procedure. Your caregiver may also give you more specific instructions. Your treatment has been planned according to current medical practices, but problems sometimes occur. Call your caregiver if you have any problems or questions after your procedure. HOME CARE INSTRUCTIONS You may shower the day after the procedure. Remove the bandage (dressing) and gently wash the site with plain soap and water. Gently pat the site dry.  Do not apply powder or lotion to the site.  Do not submerge the affected site in water for 3 to 5 days.  Inspect the site at least twice daily.  Do not flex or bend the affected arm for 24 hours.  No lifting over 5 pounds (2.3 kg) for 5 days  after your procedure.  Do not drive home if you are discharged the same day of the procedure. Have someone else drive you.  You may drive 24 hours after the procedure unless otherwise instructed by your caregiver.  What to expect: Any bruising will usually fade within 1 to 2 weeks.  Blood that collects in the tissue (hematoma) may be painful to the touch. It should usually decrease in size and tenderness within 1 to 2 weeks.  SEEK IMMEDIATE MEDICAL CARE IF: You have unusual pain at the radial site.  You have redness, warmth, swelling, or pain at the radial site.  You have drainage (other than a small  amount of blood on the dressing).  You have chills.  You have a fever or persistent symptoms for more than 72 hours.  You have a fever and your symptoms suddenly get worse.  Your arm becomes pale, cool, tingly, or numb.  You have heavy bleeding from the site. Hold pressure on the site.   PLEASE DO NOT MISS ANY DOSES OF YOUR BRILINTA!!!!! Also keep a log of you blood pressures and bring back to your follow up appt. Please call the office with any questions.   Patients taking blood thinners should generally stay away from medicines like ibuprofen, Advil, Motrin, naproxen, and Aleve due to risk of stomach bleeding. You may take Tylenol as directed or talk to your primary doctor about alternatives.   Increase activity slowly   Complete by: As directed         Discharge Medications     Medication List     STOP taking these medications    meclizine 25 MG tablet Commonly known as: ANTIVERT       TAKE these medications    amLODipine 10 MG tablet Commonly known as: NORVASC Take 1 tablet (10 mg total) by mouth daily. What changed: when to take this   aspirin 81 MG chewable tablet Chew 1 tablet (81 mg total) by mouth daily.   atorvastatin 80 MG tablet Commonly known as: LIPITOR Take 1 tablet (80 mg total) by mouth daily.   cloNIDine 0.3 MG tablet Commonly known as: Catapres TAKE ONE TABLET BY MOUTH AT BEDTIME What changed:  how much to take how to take this when to take this additional instructions   labetalol 200 MG tablet Commonly known as: NORMODYNE TAKE ONE TABLET BY MOUTH ONCE DAILY EVERY MORNING What changed:  how much to take how to take this when to take this additional instructions   lisinopril 10 MG tablet Commonly known as: ZESTRIL TAKE 1 TABLET BY MOUTH AT BEDTIME   ticagrelor 90 MG Tabs tablet Commonly known as: BRILINTA Take 1 tablet (90 mg total) by mouth 2 (two) times daily.   triamterene-hydrochlorothiazide 75-50 MG tablet Commonly known  as: MAXZIDE Take 1 tablet by mouth daily.         Yes                               AHA/ACC Clinical Performance & Quality Measures: Aspirin prescribed? - Yes ADP Receptor Inhibitor (Plavix/Clopidogrel, Brilinta/Ticagrelor or Effient/Prasugrel) prescribed (includes medically managed patients)? - Yes Beta Blocker prescribed? - Yes High Intensity Statin (Lipitor 40-80mg  or Crestor 20-40mg ) prescribed? - Yes EF assessed during THIS hospitalization? - Yes For EF <40%, was ACEI/ARB prescribed? - Not Applicable (EF >/= [id number]) For EF <40%, Aldosterone Antagonist (Spironolactone or Eplerenone) prescribed? - Not Applicable (EF >/= [id number]) Cardiac  Rehab Phase II ordered (Included Medically managed Patients)? - Yes    Outstanding Labs/Studies   FLP/LFTs in 8 weeks.   Duration of Discharge Encounter   Greater than 30 minutes including physician time.  Signed, Reino Bellis NP-C 02/02/2020, 9:13 AM

## 2020-02-02 NOTE — Discharge Instructions (Signed)
Heart Attack The heart is a muscle that needs oxygen to survive. A heart attack is a condition that occurs when your heart does not get enough oxygen. When this happens, the heart muscle begins to die. This can cause permanent damage if not treated right away. A heart attack is a medical emergency. This condition may be called a myocardial infarction, or MI. It is also known as acute coronary syndrome (ACS). ACS is a term used to describe a group of conditions that affect blood flow to the heart. What are the causes? This condition may be caused by:  Atherosclerosis. This occurs when a fatty substance called plaque builds up in the arteries and blocks or reduces blood supply to the heart.  A blood clot. A blood clot can develop suddenly when plaque breaks up within an artery and blocks blood flow to the heart.  Low blood pressure.  An abnormal heartbeat (arrhythmia).  Conditions that cause a decrease of oxygen to the heart, such as anemiaorrespiratory failure.  A spasm, or severe tightening, of a blood vessel that cuts off blood flow to the heart.  Tearing of a coronary artery (spontaneous coronary artery dissection).  High blood pressure. What increases the risk? The following factors may make you more likely to develop this condition:  Aging. The older you are, the higher your risk.  Having a personal or family history of chest pain, heart attack, stroke, or narrowing of the arteries in the legs, arms, head, or stomach (peripheral artery disease).  Being female.  Smoking.  Not getting regular exercise.  Being overweight or obese.  Having high blood pressure.  Having high cholesterol (hypercholesterolemia).  Having diabetes.  Drinking too much alcohol.  Using illegal drugs, such as cocaine or methamphetamine. What are the signs or symptoms? Symptoms of this condition may vary, depending on factors like gender and age. Symptoms may include:  Chest pain. It may feel  like: ? Crushing or squeezing. ? Tightness, pressure, fullness, or heaviness.  Pain in the arm, neck, jaw, back, or upper body.  Shortness of breath.  Heartburn or upset stomach.  Nausea.  Sudden cold sweats.  Feeling tired.  Sudden light-headedness. How is this diagnosed? This condition may be diagnosed through tests, such as:  Electrocardiogram (ECG) to measure the electrical activity of your heart.  Blood tests to check for cardiac markers. These chemicals are released by a damaged heart muscle.  A test to evaluate blood flow and heart function (coronary angiogram).  CT scan to see the heart more clearly.  A test to evaluate the pumping action of the heart (echocardiogram). How is this treated? A heart attack must be treated as soon as possible. Treatment may include:  Medicines to: ? Break up or dissolve blood clots (fibrinolytic therapy). ? Thin blood and help prevent blood clots. ? Treat blood pressure. ? Improve blood flow to the heart. ? Reduce pain. ? Reduce cholesterol.  Angioplasty and stent placement. These are procedures to widen a blocked artery and keep it open.  Coronary artery bypass graft, CABG, or open heart surgery. This enables blood to flow to the heart by going around the blocked part of the artery.  Oxygen therapy if needed.  Cardiac rehabilitation. This improves your health and well-being through exercise, education, and counseling. Follow these instructions at home: Medicines  Take over-the-counter and prescription medicines only as told by your health care provider.  Do not take the following medicines unless your health care provider says it is okay  to take them: ? NSAIDs, such as ibuprofen. ? Supplements that contain vitamin A, vitamin E, or both. ? Hormone replacement therapy that contains estrogen with or without progestin. Lifestyle   Do not use any products that contain nicotine or tobacco, such as cigarettes, e-cigarettes,  and chewing tobacco. If you need help quitting, ask your health care provider.  Avoid secondhand smoke.  Exercise regularly. Ask your health care provider about participating in a cardiac rehabilitation program that helps you start exercising safely after a heart attack.  Eat a heart-healthy diet. Your health care provider will tell you what foods to eat.  Maintain a healthy weight.  Learn ways to manage stress.  Do not use illegal drugs. Alcohol use  Do not drink alcohol if: ? Your health care provider tells you not to drink. ? You are pregnant, may be pregnant, or are planning to become pregnant.  If you drink alcohol: ? Limit how much you use to:  0-1 drink a day for women.  0-2 drinks a day for men. ? Be aware of how much alcohol is in your drink. In the [place] one drink equals one 12 oz bottle of beer (355 mL), one 5 oz glass of wine (148 mL), or one 1 oz glass of hard liquor (44 mL). General instructions  Work with your health care provider to manage any other conditions you have, such as high blood pressure or diabetes. These conditions affect your heart.  Get screened for depression, and seek treatment if needed.  Keep your vaccinations up to date. Get the flu vaccine every year.  Keep all follow-up visits as told by your health care provider. This is important. Contact a health care provider if:  You feel overwhelmed or sad.  You have trouble doing your daily activities. Get help right away if:  You have sudden, unexplained discomfort in your chest, arms, back, neck, jaw, or upper body.  You have shortness of breath.  You suddenly start to sweat or your skin gets clammy.  You feel nauseous or you vomit.  You have unexplained tiredness or weakness.  You suddenly feel light-headed or dizzy.  You notice your heart starts to beat fast or feels like it is skipping beats.  You have blood pressure that is higher than 180/120. These symptoms may represent a  serious problem that is an emergency. Do not wait to see if the symptoms will go away. Get medical help right away. Call your local emergency services (911 in the [place] Do not drive yourself to the hospital. Summary  A heart attack, also called myocardial infarction, is a condition that occurs when your heart does not get enough oxygen. This is caused by anything that blocks or reduces blood flow to the heart.  Treatment is a combination of medicines and surgeries, if needed, to open the blocked arteries and restore blood flow to the heart.  A heart attack is an emergency. Get help right away if you have sudden discomfort in your chest, arms, back, neck, jaw, or upper body. Seek help if you feel nauseous, you vomit, or you feel light-headed or dizzy. This information is not intended to replace advice given to you by your health care provider. Make sure you discuss any questions you have with your health care provider. Document Revised: 01/06/2019 Document Reviewed: 01/10/2019 Elsevier Patient Education  2020 [employer]  

## 2020-02-02 NOTE — Progress Notes (Signed)
CARDIAC REHAB PHASE I   PRE:  Rate/Rhythm: 87 SR    BP: sitting 117/89    SaO2:   MODE:  Ambulation: 470 ft   POST:  Rate/Rhythm: 107 ST    BP: sitting 135/91     SaO2:   Tolerated well, no c/o. Discussed MI, stent, Brilinta, restrictions, diet, exercise, NTG, and CRPII. Pt receptive. Understanding the importance of Brilinta. She plans to make some life/work balance changes, decrease soda and work on diet/exercise. Will refer to [place]. [id number]   Darrick Meigs CES, ACSM 02/02/2020 9:06 AM

## 2020-02-02 NOTE — Telephone Encounter (Signed)
TOC per Reino Bellis. Patient will see Robbie Lis on 02/10/20 at 11:15am.

## 2020-02-02 NOTE — Care Management (Signed)
[id number] 02-02-20 Benefits check submitted for Brilinta. Case Manage will follow for cost. Graves-Bigelow, Ocie Cornfield, RN,BSN

## 2020-02-02 NOTE — Telephone Encounter (Signed)
Pls call and let her know that I am aware of her recent hospitalization Has CAD with stent. Needs to stay on medications prescribed, and follow lifestyle recommendations  Her TOC is with cardiology, she has recently had labs, need to keep in office appt in June, call if she needs [place] before ( cardiology is arranging her time away from work)

## 2020-02-02 NOTE — Care Management (Signed)
Per Dolores Frame W/[facility] Co-pay amount for Brilinta 90 mg. bid for 30 day supply $45.00 at  a retail pharmacy. Mail order pharmacy express [facility] Brilinta 90mg . for a 90 day supply $90.00.  No PA required No deductible Retail pharmacy: [facility] .  Ref# [id number].

## 2020-02-03 NOTE — Telephone Encounter (Signed)
**Note De-Identified [name] Obfuscation** Patient contacted regarding discharge from [facility] on 02/02/2020.  Patient understands to follow up with provider Robbie Lis, PA-c on 02/10/2020 at 11:15 at [facility] in [place], [place] [address].   The pt states that this appt was scheduled for her while she was in the hosp and that she has a trip planned and cannot keep this appt.  She is requesting another appt. Date. She is aware that I am forwarding this message to our scheduling team to contact her to reschedule her hosp f/u.  She thanked me for calling her and is awaiting call from scheduling.   Patient understands discharge instructions? Yes Patient understands medications and regiment? Yes Patient understands to bring all medications to this visit? Yes

## 2020-02-06 ENCOUNTER — Encounter: Payer: Self-pay | Admitting: Family Medicine

## 2020-02-09 NOTE — Telephone Encounter (Signed)
Was told she was going to get nitroglycerin but it wasn't sent in, Wants to know if you can send this in for her.

## 2020-02-10 ENCOUNTER — Ambulatory Visit: Payer: BC Managed Care – PPO | Admitting: Physician Assistant

## 2020-02-10 NOTE — Telephone Encounter (Signed)
Though it makes sense, I do not see this on her discharge summary, please advise her to contact her Cardiologist

## 2020-02-13 NOTE — Telephone Encounter (Signed)
LVM for pt to call the office.

## 2020-02-16 NOTE — Telephone Encounter (Signed)
Response sent to patient on mychart

## 2020-02-17 ENCOUNTER — Encounter: Payer: Self-pay | Admitting: Physician Assistant

## 2020-02-17 NOTE — Progress Notes (Signed)
Cardiology Office Note    Date:  02/20/2020   ID:  [name], DOB September 06, 1971, MRN [id number]  PCP:  Fayrene Helper, MD  Cardiologist:  [name], MD  Chief Complaint: Hospital follow up   History of Present Illness:   [name] is a 49 y.o. female with hx of HTN admitted 01/2020 with STEMI. Presented for chest pain. Emergent cath showed 80% flow limiting lesion by DFR of 0.86. Underwent PTCA/DES x 1. DAPT with ASA and Brillinta. Echo with preserved LEVEF. On BB and statin.   Presents for follow up.  No specific complaint except intermittent shortness of breath which has been improving since discharge.  Walking about 1 mile per day.  She has join gym.  Denies chest pain, dizziness, orthopnea, PND, syncope, lower extremity edema or melena.   Past Medical History:  Diagnosis Date  . CAD (coronary artery disease), native coronary artery    s/p DES to LAD 02/01/20  . Chest pain 01/2020  . Facial numbness   . Fibroid   . Fx ankle   . Hypertension   . Obesity     Past Surgical History:  Procedure Laterality Date  . APPENDECTOMY    . APPLICATION OF CRANIAL NAVIGATION N/A 08/16/2019   Procedure: APPLICATION OF CRANIAL NAVIGATION;  Surgeon: Judith Part, MD;  Location: [facility];  Service: Neurosurgery;  Laterality: N/A;  . bilateral breast reduction  2004  . CHOLECYSTECTOMY    . CORONARY STENT INTERVENTION N/A 02/01/2020   Procedure: CORONARY STENT INTERVENTION;  Surgeon: Burnell Blanks, MD;  Location: [facility];  Service: Cardiovascular;  Laterality: N/A;  . CRANIOTOMY Left 08/16/2019   Procedure: Left craniotomy for tumor resection;  Surgeon: Judith Part, MD;  Location: [facility];  Service: Neurosurgery;  Laterality: Left;  Left craniotomy for tumor resection  . INTRAVASCULAR PRESSURE WIRE/FFR STUDY N/A 02/01/2020   Procedure: INTRAVASCULAR PRESSURE WIRE/FFR STUDY;  Surgeon: Burnell Blanks, MD;  Location: [facility];   Service: Cardiovascular;  Laterality: N/A;  . LEFT HEART CATH AND CORONARY ANGIOGRAPHY N/A 02/01/2020   Procedure: LEFT HEART CATH AND CORONARY ANGIOGRAPHY;  Surgeon: Burnell Blanks, MD;  Location: [facility];  Service: Cardiovascular;  Laterality: N/A;  . REDUCTION MAMMAPLASTY Bilateral   . removal of left ovarian cyst  2003  . TUBAL LIGATION  2011   Women's, removal of tubes 2013    Current Medications: Prior to Admission medications   Medication Sig Start Date End Date Taking? Authorizing Provider  amLODipine (NORVASC) 10 MG tablet Take 1 tablet (10 mg total) by mouth daily. Patient taking differently: Take 10 mg by mouth at bedtime.  05/12/19   Fayrene Helper, MD  aspirin 81 MG chewable tablet Chew 1 tablet (81 mg total) by mouth daily. 02/02/20   Cheryln Manly, NP  atorvastatin (LIPITOR) 80 MG tablet Take 1 tablet (80 mg total) by mouth daily. 02/02/20   Cheryln Manly, NP  cloNIDine (CATAPRES) 0.3 MG tablet TAKE ONE TABLET BY MOUTH AT BEDTIME Patient taking differently: Take 0.3 mg by mouth at bedtime.  10/19/19   Fayrene Helper, MD  labetalol (NORMODYNE) 200 MG tablet TAKE ONE TABLET BY MOUTH ONCE DAILY EVERY MORNING Patient taking differently: Take 200 mg by mouth in the morning.  10/19/19   Fayrene Helper, MD  lisinopril (ZESTRIL) 10 MG tablet TAKE 1 TABLET BY MOUTH AT BEDTIME Patient taking differently: Take 10 mg by mouth  at bedtime.  01/13/20   Fayrene Helper, MD  ticagrelor (BRILINTA) 90 MG TABS tablet Take 1 tablet (90 mg total) by mouth 2 (two) times daily. 02/02/20   Cheryln Manly, NP  triamterene-hydrochlorothiazide (MAXZIDE) 75-50 MG tablet Take 1 tablet by mouth daily. 05/12/19   Fayrene Helper, MD    Allergies:   Patient has no known allergies.   Social History   Socioeconomic History  . Marital status: Single    Spouse name: Not on file  . Number of children: 4  . Years of education: Not on file  . Highest education  level: Associate degree: occupational, Hotel manager, or vocational program  Occupational History  . Not on file  Tobacco Use  . Smoking status: Never Smoker  . Smokeless tobacco: Never Used  Substance and Sexual Activity  . Alcohol use: Yes    Comment: rarely  . Drug use: No  . Sexual activity: Yes    Birth control/protection: Surgical  Other Topics Concern  . Not on file  Social History Narrative   Lives with children   Caffeine- sodas, 48 oz daily   Social Determinants of Health   Financial Resource Strain:   . Difficulty of Paying Living Expenses:   Food Insecurity:   . Worried About Charity fundraiser in the Last Year:   . Arboriculturist in the Last Year:   Transportation Needs:   . Film/video editor (Medical):   Marland Kitchen Lack of Transportation (Non-Medical):   Physical Activity:   . Days of Exercise per Week:   . Minutes of Exercise per Session:   Stress:   . Feeling of Stress :   Social Connections:   . Frequency of Communication with Friends and Family:   . Frequency of Social Gatherings with Friends and Family:   . Attends Religious Services:   . Active Member of Clubs or Organizations:   . Attends Archivist Meetings:   Marland Kitchen Marital Status:      Family History:  The patient's family history includes Coronary artery disease in her father and mother; Heart attack in her maternal grandfather; Hypertension in her father, mother, and sister; Kidney disease in her father; Multiple sclerosis in her sister; Stroke in her maternal grandmother.   ROS:   Please see the history of present illness.    ROS All other systems reviewed and are negative.   PHYSICAL EXAM:   VS:  BP 116/74   Pulse 88   Ht 5\' 3"  (1.6 m)   Wt 271 lb 6.4 oz (123.1 kg)   SpO2 97%   BMI 48.08 kg/m    GEN: Well nourished, well developed, in no acute distress  HEENT: normal  Neck: no JVD, carotid bruits, or masses Cardiac: RRR; no murmurs, rubs, or gallops,no edema  Respiratory:  clear  to auscultation bilaterally, normal work of breathing GI: soft, nontender, nondistended, + BS MS: no deformity or atrophy  Skin: warm and dry, no rash Neuro:  Alert and Oriented x 3, Strength and sensation are intact Psych: euthymic mood, full affect  Wt Readings from Last 3 Encounters:  02/20/20 271 lb 6.4 oz (123.1 kg)  02/01/20 269 lb (122 kg)  10/19/19 269 lb (122 kg)      Studies/Labs Reviewed:   EKG:  EKG is ordered today.  The ekg ordered today demonstrates normal sinus rhythm at rate of 88 bpm.  Recent Labs: 02/01/2020: ALT 22 02/02/2020: BUN 11; Creatinine, Ser 0.64; Hemoglobin 11.9;  Platelets 233; Potassium 3.7; Sodium 133   Lipid Panel    Component Value Date/Time   CHOL 162 02/02/2020 0830   TRIG 75 02/02/2020 0830   HDL 55 02/02/2020 0830   CHOLHDL 2.9 02/02/2020 0830   VLDL 15 02/02/2020 0830   LDLCALC 92 02/02/2020 0830   LDLCALC 91 11/05/2017 0906    Additional studies/ records that were reviewed today include:   Echocardiogram: 02/01/20 1. Left ventricular ejection fraction, by estimation, is 55 to 60%. The  left ventricle has normal function. The left ventricle has no regional  wall motion abnormalities. There is moderate left ventricular hypertrophy.  Left ventricular diastolic  parameters are consistent with Grade I diastolic dysfunction (impaired  relaxation).  2. Right ventricular systolic function is normal. The right ventricular  size is normal. Tricuspid regurgitation signal is inadequate for assessing  PA pressure.  3. The mitral valve is grossly normal. No evidence of mitral valve  regurgitation. No evidence of mitral stenosis.  4. The aortic valve is tricuspid. Aortic valve regurgitation is not  visualized. No aortic stenosis is present.  5. The inferior vena cava is normal in size with greater than 50%  respiratory variability, suggesting right atrial pressure of 3 mmHg.   CORONARY STENT INTERVENTION  02/01/20  INTRAVASCULAR PRESSURE  WIRE/FFR STUDY  LEFT HEART CATH AND CORONARY ANGIOGRAPHY  Conclusion    Mid LAD lesion is 80% stenosed.  A drug-eluting stent was successfully placed using a STENT RESOLUTE ONYX 3.5X18.  Post intervention, there is a 0% residual stenosis.  The left ventricular systolic function is normal.  LV end diastolic pressure is normal.  The left ventricular ejection fraction is 55-65% by visual estimate.  There is no mitral valve regurgitation.   1. Severe stenosis mid LAD. Flow limiting by DFR 0.86.  2. Successful PTCA/DES x 1 mid LAD 3. No obstructive disease in the RCA or Circumflex 4. Normal LV systolic function  Recommendations: Continue DAPT for one year with ASA and Brilinta. Will start high intensity statin. Continue beta blocker. Echo later today.    Diagnostic Dominance: Right  Intervention       ASSESSMENT & PLAN:    1. CAD s/p DES to mLAD No angina.  Continue aspirin, Brilinta, statin and beta-blocker.  2. HTN -On multiple antihypertensive medications.  Blood pressure stable and well-controlled.  No change.  3. HLD - 02/02/2020: Cholesterol 162; HDL 55; LDL Cholesterol 92; Triglycerides 75; VLDL 15  -Continue high intensity statin -Recheck lab in couple of weeks  4.  Morbid obesity -Encouraged weight loss and regular exercise -Patient wants to change her lifestyle and already has joined a gym.    Medication Adjustments/Labs and Tests Ordered: Current medicines are reviewed at length with the patient today.  Concerns regarding medicines are outlined above.  Medication changes, Labs and Tests ordered today are listed in the Patient Instructions below. Patient Instructions  Medication Instructions:  Your physician has recommended you make the following change in your medication:  1.  START Nitroglycerin 0.4 s/l tablets.  TAKE ONLY AS NEEDED AS DIRECTED  *If you need a refill on your cardiac medications before your next appointment, please call your  pharmacy*   Lab Work: 04/02/20:  COME TO THE OFFICE FOR FASTING LABS (nothing to eat or drink after midnight the night before)  LIPID/LFT  If you have labs (blood work) drawn today and your tests are completely normal, you will receive your results only by: Marland Kitchen MyChart Message (if you have MyChart)  OR . A paper copy in the mail If you have any lab test that is abnormal or we need to change your treatment, we will call you to review the results.   Testing/Procedures: None ordered   Follow-Up: At [facility], you and your health needs are our priority.  As part of our continuing mission to provide you with exceptional heart care, we have created designated Provider Care Teams.  These Care Teams include your primary Cardiologist (physician) and Advanced Practice Providers (APPs -  Physician Assistants and Nurse Practitioners) who all work together to provide you with the care you need, when you need it.  We recommend signing up for the patient portal called "MyChart".  Sign up information is provided on this After Visit Summary.  MyChart is used to connect with patients for Virtual Visits (Telemedicine).  Patients are able to view lab/test results, encounter notes, upcoming appointments, etc.  Non-urgent messages can be sent to your provider as well.   To learn more about what you can do with MyChart, go to [email or web address].    Your next appointment:   3 month(s)  The format for your next appointment:   In Person  Provider:   [name], MD   Other Instructions      Signed, Leanor Kail, [place]  02/20/2020 9:14 AM    [address], [place], [place]  [address] Phone: [phone]; Fax: [phone]

## 2020-02-20 ENCOUNTER — Other Ambulatory Visit: Payer: Self-pay

## 2020-02-20 ENCOUNTER — Encounter: Payer: Self-pay | Admitting: Physician Assistant

## 2020-02-20 ENCOUNTER — Ambulatory Visit: Payer: BC Managed Care – PPO | Admitting: Physician Assistant

## 2020-02-20 VITALS — BP 116/74 | HR 88 | Ht 63.0 in | Wt 271.4 lb

## 2020-02-20 DIAGNOSIS — E785 Hyperlipidemia, unspecified: Secondary | ICD-10-CM

## 2020-02-20 DIAGNOSIS — I10 Essential (primary) hypertension: Secondary | ICD-10-CM

## 2020-02-20 DIAGNOSIS — I214 Non-ST elevation (NSTEMI) myocardial infarction: Secondary | ICD-10-CM

## 2020-02-20 DIAGNOSIS — I2510 Atherosclerotic heart disease of native coronary artery without angina pectoris: Secondary | ICD-10-CM | POA: Diagnosis not present

## 2020-02-20 DIAGNOSIS — I2102 ST elevation (STEMI) myocardial infarction involving left anterior descending coronary artery: Secondary | ICD-10-CM

## 2020-02-20 MED ORDER — NITROGLYCERIN 0.4 MG SL SUBL: 0.4000 mg | SUBLINGUAL_TABLET | SUBLINGUAL | 3 refills | Status: DC | PRN

## 2020-02-20 NOTE — Patient Instructions (Signed)
Medication Instructions:  Your physician has recommended you make the following change in your medication:  1.  START Nitroglycerin 0.4 s/l tablets.  TAKE ONLY AS NEEDED AS DIRECTED  *If you need a refill on your cardiac medications before your next appointment, please call your pharmacy*   Lab Work: 04/02/20:  COME TO THE OFFICE FOR FASTING LABS (nothing to eat or drink after midnight the night before)  LIPID/LFT  If you have labs (blood work) drawn today and your tests are completely normal, you will receive your results only by: Marland Kitchen MyChart Message (if you have MyChart) OR . A paper copy in the mail If you have any lab test that is abnormal or we need to change your treatment, we will call you to review the results.   Testing/Procedures: None ordered   Follow-Up: At [facility], you and your health needs are our priority.  As part of our continuing mission to provide you with exceptional heart care, we have created designated Provider Care Teams.  These Care Teams include your primary Cardiologist (physician) and Advanced Practice Providers (APPs -  Physician Assistants and Nurse Practitioners) who all work together to provide you with the care you need, when you need it.  We recommend signing up for the patient portal called "MyChart".  Sign up information is provided on this After Visit Summary.  MyChart is used to connect with patients for Virtual Visits (Telemedicine).  Patients are able to view lab/test results, encounter notes, upcoming appointments, etc.  Non-urgent messages can be sent to your provider as well.   To learn more about what you can do with MyChart, go to [email or web address].    Your next appointment:   3 month(s)  The format for your next appointment:   In Person  Provider:   [name], MD   Other Instructions

## 2020-02-27 ENCOUNTER — Telehealth: Payer: Self-pay | Admitting: Physician Assistant

## 2020-02-27 NOTE — Telephone Encounter (Signed)
FMLA/disability form received. Placed in box for [employer], PA to review. 02/27/20 vlm

## 2020-03-05 MED ORDER — TICAGRELOR 90 MG PO TABS: 90.0000 mg | ORAL_TABLET | Freq: Two times a day (BID) | ORAL | 3 refills | Status: DC

## 2020-03-05 MED ORDER — ATORVASTATIN CALCIUM 80 MG PO TABS: 80.0000 mg | ORAL_TABLET | Freq: Every day | ORAL | 3 refills | Status: DC

## 2020-03-19 ENCOUNTER — Encounter: Payer: BC Managed Care – PPO | Admitting: Family Medicine

## 2020-03-20 ENCOUNTER — Ambulatory Visit (INDEPENDENT_AMBULATORY_CARE_PROVIDER_SITE_OTHER): Payer: BC Managed Care – PPO | Admitting: Family Medicine

## 2020-03-20 ENCOUNTER — Other Ambulatory Visit: Payer: Self-pay

## 2020-03-20 ENCOUNTER — Encounter: Payer: Self-pay | Admitting: Family Medicine

## 2020-03-20 VITALS — BP 114/72 | HR 96 | Temp 97.3°F | Resp 15 | Ht 63.0 in | Wt 277.0 lb

## 2020-03-20 DIAGNOSIS — H9192 Unspecified hearing loss, left ear: Secondary | ICD-10-CM | POA: Diagnosis not present

## 2020-03-20 DIAGNOSIS — M67471 Ganglion, right ankle and foot: Secondary | ICD-10-CM

## 2020-03-20 DIAGNOSIS — I10 Essential (primary) hypertension: Secondary | ICD-10-CM

## 2020-03-20 DIAGNOSIS — Z0000 Encounter for general adult medical examination without abnormal findings: Secondary | ICD-10-CM

## 2020-03-20 DIAGNOSIS — Z0001 Encounter for general adult medical examination with abnormal findings: Secondary | ICD-10-CM

## 2020-03-20 DIAGNOSIS — E782 Mixed hyperlipidemia: Secondary | ICD-10-CM

## 2020-03-20 DIAGNOSIS — E559 Vitamin D deficiency, unspecified: Secondary | ICD-10-CM

## 2020-03-20 NOTE — Patient Instructions (Signed)
F/U in 2nd week in Novemebr, call if you need me before  You are referred to ENT for evaluation of left hearing loss  You are referred to Podiatry re swelling on right 4th toe  Fasting lipid,cmp and EGFr, tSH and CBC first week in November  We will be in touch re covid vaccine  Thanks for choosing [facility], we consider it a privelige to serve you.   Think about what you will eat, plan ahead. Choose " clean, green, fresh or frozen" over canned, processed or packaged foods which are more sugary, salty and fatty. 70 to 75% of food eaten should be vegetables and fruit. Three meals at set times with snacks allowed between meals, but they must be fruit or vegetables. Aim to eat over a 12 hour period , example 7 am to 7 pm, and STOP after  your last meal of the day. Drink water,generally about 64 ounces per day, no other drink is as healthy. Fruit juice is best enjoyed in a healthy way, by EATING the fruit.

## 2020-03-20 NOTE — Assessment & Plan Note (Signed)
8 month history following surgery for schwannoma, refer ENT

## 2020-03-20 NOTE — Assessment & Plan Note (Signed)

## 2020-03-20 NOTE — Progress Notes (Signed)
[name]     MRN: [id number]      DOB: [birth year]  HPI: Patient is in for annual physical exam. C/o hearing loss in left ear had surgery for removal of vestibular schwannoma in 08/2019, was advised that there was risk of this, and since then no recovery as far as pt is concerned, needs ENT eval .  01/2020, has 1 stent, still feels short of breath with activity, s/e of medication, and will start  Rehab June 21, has had 1 cardiology f/u , no med chnages at that visit painless swelling on right 4th toe x 8 months wants this addressed Wants to know when she ca get missed covid vaccine due to illness when due  Needs covid vaccine completed  Had nstemi  PE: BP 114/72   Pulse 96   Temp (!) 97.3 F (36.3 C) (Temporal)   Resp 15   Ht 5\' 3"  (1.6 m)   Wt 277 lb (125.6 kg)   SpO2 97%   BMI 49.07 kg/m   Pleasant  female, alert and oriented x 3, in no cardio-pulmonary distress. Afebrile. HEENT No facial trauma or asymetry. Sinuses non tender.  Extra occullar muscles intact.. External ears normal, . Neck: supple, no adenopathy,JVD or thyromegaly.No bruits.  Chest: Clear to ascultation bilaterally.No crackles or wheezes. Non tender to palpation  Breast: Not examined  Cardiovascular system; Heart sounds normal,  S1 and  S2 ,no S3.  No murmur, or thrill. Apical beat not displaced Peripheral pulses normal.  Abdomen: Soft, non tender, no organomegaly or masses. No bruits. Bowel sounds normal. No guarding, tenderness or rebound.   GU: Asymptomatic, not examined   Musculoskeletal exam: Full ROM of spine, hips , shoulders and knees. No deformity ,swelling or crepitus noted. No muscle wasting or atrophy.   Neurologic: Cranial nerves left hearing loss , otherwise normal Power, tone ,sensation and reflexes normal throughout. No disturbance in gait. No tremor.  Skin: Intact, no ulceration, erythema , scaling or rash noted. Pigmentation normal throughout. Cystic  swelling of 4th toe right foot  Psych; Normal mood and affect. Judgement and concentration normal   Assessment & Plan:  Annual physical exam Annual exam as documented. Counseling done  re healthy lifestyle involving commitment to 150 minutes exercise per week, heart healthy diet, and attaining healthy weight.The importance of adequate sleep also discussed. Regular seat belt use and home safety, is also discussed. Changes in health habits are decided on by the patient with goals and time frames  set for achieving them. Immunization and cancer screening needs are specifically addressed at this visit.   Left ear hearing loss 8 month history following surgery for schwannoma, refer ENT  Morbid obesity  Patient re-educated about  the importance of commitment to a  minimum of 150 minutes of exercise per week as able.  The importance of healthy food choices with portion control discussed, as well as eating regularly and within a 12 hour window most days. The need to choose "clean , green" food 50 to 75% of the time is discussed, as well as to make water the primary drink and set a goal of 64 ounces water daily.    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

## 2020-03-23 ENCOUNTER — Other Ambulatory Visit: Payer: Self-pay | Admitting: Family Medicine

## 2020-03-23 DIAGNOSIS — I10 Essential (primary) hypertension: Secondary | ICD-10-CM

## 2020-03-25 ENCOUNTER — Encounter: Payer: Self-pay | Admitting: Family Medicine

## 2020-03-25 ENCOUNTER — Telehealth: Payer: Self-pay | Admitting: Family Medicine

## 2020-03-25 DIAGNOSIS — M67471 Ganglion, right ankle and foot: Secondary | ICD-10-CM | POA: Insufficient documentation

## 2020-03-25 NOTE — Assessment & Plan Note (Signed)
  Patient re-educated about  the importance of commitment to a  minimum of 150 minutes of exercise per week as able.  The importance of healthy food choices with portion control discussed, as well as eating regularly and within a 12 hour window most days. The need to choose "clean , green" food 50 to 75% of the time is discussed, as well as to make water the primary drink and set a goal of 64 ounces water daily.    Weight /BMI 03/20/2020 02/20/2020 02/01/2020  WEIGHT 277 lb 271 lb 6.4 oz 269 lb  HEIGHT 5\' 3"  5\' 3"  5\' 3"   BMI 49.07 kg/m2 48.08 kg/m2 47.65 kg/m2

## 2020-03-25 NOTE — Telephone Encounter (Signed)
Lease let pt know that she should get her 2nd Covid vaccine which she missed while ill, as soon as possible, no need to hold off on getting it and the sooner , the better, just needs to get the same vaccine

## 2020-03-25 NOTE — Assessment & Plan Note (Signed)
Cystic swelling on right 4th toe x 8 months, refer Podiatry

## 2020-03-28 NOTE — Telephone Encounter (Signed)
Advised pt to get second Covid vaccine with verbal understanding.

## 2020-04-02 ENCOUNTER — Encounter (HOSPITAL_COMMUNITY): Payer: Self-pay

## 2020-04-02 ENCOUNTER — Other Ambulatory Visit: Payer: Self-pay

## 2020-04-02 ENCOUNTER — Other Ambulatory Visit: Payer: BC Managed Care – PPO | Admitting: *Deleted

## 2020-04-02 ENCOUNTER — Encounter (HOSPITAL_COMMUNITY)
Admission: RE | Admit: 2020-04-02 | Discharge: 2020-04-02 | Disposition: A | Discharge status: Home / Self Care | Payer: BC Managed Care – PPO | Source: Ambulatory Visit | Attending: Cardiovascular Disease | Admitting: Cardiovascular Disease

## 2020-04-02 VITALS — BP 110/72 | HR 87 | Ht 64.0 in | Wt 272.9 lb

## 2020-04-02 DIAGNOSIS — E785 Hyperlipidemia, unspecified: Secondary | ICD-10-CM | POA: Diagnosis not present

## 2020-04-02 DIAGNOSIS — I213 ST elevation (STEMI) myocardial infarction of unspecified site: Secondary | ICD-10-CM | POA: Insufficient documentation

## 2020-04-02 DIAGNOSIS — Z955 Presence of coronary angioplasty implant and graft: Secondary | ICD-10-CM | POA: Diagnosis not present

## 2020-04-02 DIAGNOSIS — I214 Non-ST elevation (NSTEMI) myocardial infarction: Secondary | ICD-10-CM | POA: Diagnosis not present

## 2020-04-02 LAB — HEPATIC FUNCTION PANEL
ALT: 16 IU/L (ref 0–32)
AST: 18 IU/L (ref 0–40)
Albumin: 4.1 g/dL (ref 3.8–4.8)
Alkaline Phosphatase: 196 IU/L — ABNORMAL HIGH (ref 48–121)
Bilirubin Total: 0.9 mg/dL (ref 0.0–1.2)
Bilirubin, Direct: 0.26 mg/dL (ref 0.00–0.40)
Total Protein: 7 g/dL (ref 6.0–8.5)

## 2020-04-02 LAB — LIPID PANEL
Chol/HDL Ratio: 2.1 ratio (ref 0.0–4.4)
Cholesterol, Total: 128 mg/dL (ref 100–199)
HDL: 60 mg/dL (ref >39)
LDL Chol Calc (NIH): 54 mg/dL (ref 0–99)
Triglycerides: 67 mg/dL (ref 0–149)
VLDL Cholesterol Cal: 14 mg/dL (ref 5–40)

## 2020-04-02 NOTE — Progress Notes (Addendum)
Cardiac Individual Treatment Plan  Patient Details  Name: [name] MRN: [id number] Date of Birth: 1971-09-11 Referring Provider:     CARDIAC REHAB PHASE II ORIENTATION from 04/02/2020 in [facility]  Referring Provider Dr. Angelena Form       Initial Encounter Date:    CARDIAC REHAB PHASE II ORIENTATION from 04/02/2020 in [facility]  Date 04/02/20      Visit Diagnosis: ST elevation myocardial infarction (STEMI), unspecified artery ([place])  Status post coronary artery stent placement  Patient's Home Medications on Admission:  Current Outpatient Medications:  .  acetaminophen (TYLENOL) 325 MG tablet, Take 325 mg by mouth every 6 (six) hours as needed., Disp: , Rfl:  .  amLODipine (NORVASC) 10 MG tablet, Take 1 tablet by mouth once daily, Disp: 90 tablet, Rfl: 0 .  aspirin 81 MG chewable tablet, Chew 1 tablet (81 mg total) by mouth daily., Disp: 90 tablet, Rfl: 1 .  atorvastatin (LIPITOR) 80 MG tablet, Take 1 tablet (80 mg total) by mouth daily., Disp: 90 tablet, Rfl: 3 .  cloNIDine (CATAPRES) 0.3 MG tablet, TAKE ONE TABLET BY MOUTH AT BEDTIME, Disp: 90 tablet, Rfl: 3 .  labetalol (NORMODYNE) 200 MG tablet, TAKE ONE TABLET BY MOUTH ONCE DAILY EVERY MORNING, Disp: 90 tablet, Rfl: 3 .  lisinopril (ZESTRIL) 10 MG tablet, TAKE 1 TABLET BY MOUTH AT BEDTIME, Disp: 90 tablet, Rfl: 3 .  nitroGLYCERIN (NITROSTAT) 0.4 MG SL tablet, Place 1 tablet (0.4 mg total) under the tongue every 5 (five) minutes as needed., Disp: 25 tablet, Rfl: 3 .  ticagrelor (BRILINTA) 90 MG TABS tablet, Take 1 tablet (90 mg total) by mouth 2 (two) times daily., Disp: 180 tablet, Rfl: 3 .  triamterene-hydrochlorothiazide (MAXZIDE) 75-50 MG tablet, Take 1 tablet by mouth daily., Disp: 90 tablet, Rfl: 1  Past Medical History: Past Medical History:  Diagnosis Date  . CAD (coronary artery disease), native coronary artery    s/p DES to LAD 02/01/20  . Chest pain 01/2020  . Facial  numbness   . Fibroid   . Fx ankle   . Hypertension   . Obesity     Tobacco Use: Social History   Tobacco Use  Smoking Status Never Smoker  Smokeless Tobacco Never Used    Labs: Recent Review Flowsheet Data    Labs for ITP Cardiac and Pulmonary Rehab Latest Ref Rng & Units 10/27/2013 11/05/2017 08/16/2019 08/16/2019 02/02/2020   Cholestrol 0 - 200 mg/dL 170 161 - - 162   LDLCALC 0 - 99 mg/dL 87 91 - - 92   HDL >40 mg/dL 62 52 - - 55   Trlycerides <150 mg/dL 103 89 - - 75   Hemoglobin A1c 4.8 - 5.6 % 5.3 5.0 - - 5.0   PHART 7.35 - 7.45 - - 7.387 - -   PCO2ART 32 - 48 mmHg - - 40.1 - -   HCO3 20.0 - 28.0 mmol/L - - 24.5 - -   TCO2 22 - 32 mmol/L - - 26 24 -   ACIDBASEDEF 0.0 - 2.0 mmol/L - - 1.0 - -   O2SAT % - - 99.0 - -      Capillary Blood Glucose: Lab Results  Component Value Date   GLUCAP 116 (H) 11/06/2009   GLUCAP 112 (H) 08/22/2009   GLUCAP 139 (H) 08/21/2009   GLUCAP 131 (H) 08/21/2009     Exercise Target Goals: Exercise Program Goal: Individual exercise prescription set using results from initial 6  min walk test and THRR while considering  patient's activity barriers and safety.   Exercise Prescription Goal: Starting with aerobic activity 30 plus minutes a day, 3 days per week for initial exercise prescription. Provide home exercise prescription and guidelines that participant acknowledges understanding prior to discharge.  Activity Barriers & Risk Stratification:  Activity Barriers & Cardiac Risk Stratification - 04/02/20 1012      Activity Barriers & Cardiac Risk Stratification   Activity Barriers Deconditioning    Cardiac Risk Stratification High           6 Minute Walk:  6 Minute Walk    Row Name 04/02/20 0956         6 Minute Walk   Phase Initial     Distance 1200 feet     Walk Time 6 minutes     # of Rest Breaks 0     MPH 2.27     METS 2.9     RPE 10     VO2 Peak 10.13     Symptoms No     Resting HR 87 bpm     Resting BP 110/72      Resting Oxygen Saturation  98 %     Exercise Oxygen Saturation  during 6 min walk 99 %     Max Ex. HR 100 bpm     Max Ex. BP 130/90     2 Minute Post BP 110/82            Oxygen Initial Assessment:   Oxygen Re-Evaluation:   Oxygen Discharge (Final Oxygen Re-Evaluation):   Initial Exercise Prescription:  Initial Exercise Prescription - 04/02/20 0900      Date of Initial Exercise RX and Referring Provider   Date 04/02/20    Referring Provider Dr. Angelena Form     Expected Discharge Date 07/03/20      Treadmill   MPH 1.5    Grade 0    Minutes 17    METs 2.14      T5 Nustep   Level 1    SPM 85    Minutes 22      Prescription Details   Frequency (times per week) 3    Duration Progress to 30 minutes of continuous aerobic without signs/symptoms of physical distress      Intensity   THRR 40-80% of Max Heartrate 69-138    Ratings of Perceived Exertion 11-13    Perceived Dyspnea 0-4      Resistance Training   Training Prescription Yes    Weight 2 lbs    Reps 10-15           Perform Capillary Blood Glucose checks as needed.  Exercise Prescription Changes:   Exercise Comments:   Exercise Goals and Review:   Exercise Goals    Row Name 04/02/20 1002             Exercise Goals   Increase Physical Activity Yes       Intervention Provide advice, education, support and counseling about physical activity/exercise needs.;Develop an individualized exercise prescription for aerobic and resistive training based on initial evaluation findings, risk stratification, comorbidities and participant's personal goals.       Expected Outcomes Short Term: Attend rehab on a regular basis to increase amount of physical activity.;Long Term: Add in home exercise to make exercise part of routine and to increase amount of physical activity.;Long Term: Exercising regularly at least 3-5 days a week.       Increase Strength  and Stamina Yes       Intervention Provide advice, education,  support and counseling about physical activity/exercise needs.;Develop an individualized exercise prescription for aerobic and resistive training based on initial evaluation findings, risk stratification, comorbidities and participant's personal goals.       Expected Outcomes Short Term: Increase workloads from initial exercise prescription for resistance, speed, and METs.;Short Term: Perform resistance training exercises routinely during rehab and add in resistance training at home;Long Term: Improve cardiorespiratory fitness, muscular endurance and strength as measured by increased METs and functional capacity (6MWT)       Able to understand and use rate of perceived exertion (RPE) scale Yes       Intervention Provide education and explanation on how to use RPE scale       Expected Outcomes Short Term: Able to use RPE daily in rehab to express subjective intensity level;Long Term:  Able to use RPE to guide intensity level when exercising independently       Knowledge and understanding of Target Heart Rate Range (THRR) Yes       Intervention Provide education and explanation of THRR including how the numbers were predicted and where they are located for reference       Expected Outcomes Short Term: Able to state/look up THRR;Long Term: Able to use THRR to govern intensity when exercising independently;Short Term: Able to use daily as guideline for intensity in rehab       Able to check pulse independently Yes       Intervention Review the importance of being able to check your own pulse for safety during independent exercise;Provide education and demonstration on how to check pulse in carotid and radial arteries.       Expected Outcomes Short Term: Able to explain why pulse checking is important during independent exercise;Long Term: Able to check pulse independently and accurately       Understanding of Exercise Prescription Yes       Intervention Provide education, explanation, and written materials  on patient's individual exercise prescription       Expected Outcomes Short Term: Able to explain program exercise prescription;Long Term: Able to explain home exercise prescription to exercise independently              Exercise Goals Re-Evaluation :    Discharge Exercise Prescription (Final Exercise Prescription Changes):   Nutrition:  Target Goals: Understanding of nutrition guidelines, daily intake of sodium 1500mg , cholesterol 200mg , calories 30% from fat and 7% or less from saturated fats, daily to have 5 or more servings of fruits and vegetables.  Biometrics:  Pre Biometrics - 04/02/20 1003      Pre Biometrics   Height 5\' 4"  (1.626 m)    Weight 123.8 kg    Waist Circumference 48 inches    Hip Circumference 54 inches    Waist to Hip Ratio 0.89 %    BMI (Calculated) 46.83    Triceps Skinfold 35 mm    % Body Fat 53.7 %    Grip Strength 40 kg    Flexibility 12 in    Single Leg Stand 12.37 seconds            Nutrition Therapy Plan and Nutrition Goals:  Nutrition Therapy & Goals - 04/02/20 0953      Personal Nutrition Goals   Comments Patient says she is eating a low fat diet and trys to eat heart healthy. Her challange is soda's. She is trying to cut back and is currently  down to 3/day. She scored 12 on her medficts diet assessment score. She is interested in meeting with RD. Will continue to monitor.      Intervention Plan   Intervention Nutrition handout(s) given to patient.           Nutrition Assessments:  Nutrition Assessments - 04/02/20 0948      MEDFICTS Scores   Pre Score 12           Nutrition Goals Re-Evaluation:   Nutrition Goals Discharge (Final Nutrition Goals Re-Evaluation):   Psychosocial: Target Goals: Acknowledge presence or absence of significant depression and/or stress, maximize coping skills, provide positive support system. Participant is able to verbalize types and ability to use techniques and skills needed for reducing  stress and depression.  Initial Review & Psychosocial Screening:  Initial Psych Review & Screening - 04/02/20 1025      Initial Review   Current issues with None Identified      Family Dynamics   Good Support System? Yes      Barriers   Psychosocial barriers to participate in program There are no identifiable barriers or psychosocial needs.      Screening Interventions   Interventions Encouraged to exercise;To provide support and resources with identified psychosocial needs;Provide feedback about the scores to participant    Expected Outcomes Short Term goal: Identification and review with participant of any Quality of Life or Depression concerns found by scoring the questionnaire.;Long Term goal: The participant improves quality of Life and PHQ9 Scores as seen by post scores and/or verbalization of changes           Quality of Life Scores:  Quality of Life - 04/02/20 0955      Quality of Life   Select Quality of Life      Quality of Life Scores   Health/Function Pre 18 %    Socioeconomic Pre 30 %    Psych/Spiritual Pre 29.14 %    Family Pre 30 %    GLOBAL Pre 24.69 %          Scores of 19 and below usually indicate a poorer quality of life in these areas.  A difference of  2-3 points is a clinically meaningful difference.  A difference of 2-3 points in the total score of the Quality of Life Index has been associated with significant improvement in overall quality of life, self-image, physical symptoms, and general health in studies assessing change in quality of life.  PHQ-9: Recent Review Flowsheet Data    Depression screen [facility] 2/9 04/02/2020 03/20/2020 05/12/2019 07/27/2018 01/05/2018   Decreased Interest 0 0 0 0 -   Down, Depressed, Hopeless 0 0 0 0 0   PHQ - 2 Score 0 0 0 0 0   Altered sleeping 0 - - - -   Tired, decreased energy 1 - - - -   Change in appetite 1 - - - -   Feeling bad or failure about yourself  0 - - - -   Trouble concentrating 0 - - - -   Moving  slowly or fidgety/restless 0 - - - -   Suicidal thoughts 0 - - - -   PHQ-9 Score 2 - - - -   Difficult doing work/chores Somewhat difficult - - - -     Interpretation of Total Score  Total Score Depression Severity:  1-4 = Minimal depression, 5-9 = Mild depression, 10-14 = Moderate depression, 15-19 = Moderately severe depression, 20-27 = Severe depression  Psychosocial Evaluation and Intervention:  Psychosocial Evaluation - 04/02/20 1026      Psychosocial Evaluation & Interventions   Interventions Encouraged to exercise with the program and follow exercise prescription;Stress management education;Relaxation education    Comments Patient's initial QOL score was 24.96 and her PHQ_9 score was2. She has no psychosocial issues identified. She rated her stress level as low. 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 Will continue to montior.    Expected Outcomes Patient will have no psychosocial issues identifed at discharge.    Continue Psychosocial Services  No Follow up required           Psychosocial Re-Evaluation:   Psychosocial Discharge (Final Psychosocial Re-Evaluation):   Vocational Rehabilitation: Provide vocational rehab assistance to qualifying candidates.   Vocational Rehab Evaluation & Intervention:  Vocational Rehab - 04/02/20 0949      Initial Vocational Rehab Evaluation & Intervention   Assessment shows need for Vocational Rehabilitation No      Vocational Rehab Re-Evaulation   Comments Patient has returned to work working full-time.           Education: Education Goals: Education classes will be provided on a weekly basis, covering required topics. Participant will state understanding/return demonstration of topics presented.  Learning Barriers/Preferences:  Learning Barriers/Preferences - 04/02/20 0948      Learning Barriers/Preferences   Learning Barriers None    Learning Preferences Skilled  Demonstration;Written Material;Audio;Group Instruction           Education Topics: Hypertension, Hypertension Reduction -Define heart disease and high blood pressure. Discus how high blood pressure affects the body and ways to reduce high blood pressure.   Exercise and Your Heart -Discuss why it is important to exercise, the FITT principles of exercise, normal and abnormal responses to exercise, and how to exercise safely.   Angina -Discuss definition of angina, causes of angina, treatment of angina, and how to decrease risk of having angina.   Cardiac Medications -Review what the following cardiac medications are used for, how they affect the body, and side effects that may occur when taking the medications.  Medications include Aspirin, Beta blockers, calcium channel blockers, ACE Inhibitors, angiotensin receptor blockers, diuretics, digoxin, and antihyperlipidemics.   Congestive Heart Failure -Discuss the definition of CHF, how to live with CHF, the signs and symptoms of CHF, and how keep track of weight and sodium intake.   Heart Disease and Intimacy -Discus the effect sexual activity has on the heart, how changes occur during intimacy as we age, and safety during sexual activity.   Smoking Cessation / COPD -Discuss different methods to quit smoking, the health benefits of quitting smoking, and the definition of COPD.   Nutrition I: Fats -Discuss the types of cholesterol, what cholesterol does to the heart, and how cholesterol levels can be controlled.   Nutrition II: Labels -Discuss the different components of food labels and how to read food label   Heart Parts/Heart Disease and PAD -Discuss the anatomy of the heart, the pathway of blood circulation through the heart, and these are affected by heart disease.   Stress I: Signs and Symptoms -Discuss the causes of stress, how stress may lead to anxiety and depression, and ways to limit stress.   Stress II:  Relaxation -Discuss different types of relaxation techniques to limit stress.   Warning Signs of Stroke / TIA -Discuss definition of a stroke, what the signs and symptoms are of a stroke,  and how to identify when someone is having stroke.   Knowledge Questionnaire Score:  Knowledge Questionnaire Score - 04/02/20 0949      Knowledge Questionnaire Score   Pre Score 9/24           Core Components/Risk Factors/Patient Goals at Admission:  Personal Goals and Risk Factors at Admission - 04/02/20 0949      Core Components/Risk Factors/Patient Goals on Admission    Weight Management Yes    Intervention Weight Management: Develop a combined nutrition and exercise program designed to reach desired caloric intake, while maintaining appropriate intake of nutrient and fiber, sodium and fats, and appropriate energy expenditure required for the weight goal.;Weight Management: Provide education and appropriate resources to help participant work on and attain dietary goals.;Weight Management/Obesity: Establish reasonable short term and long term weight goals.;Obesity: Provide education and appropriate resources to help participant work on and attain dietary goals.    Admit Weight 273 lb (123.8 kg)    Goal Weight: Short Term 268 lb (121.6 kg)    Goal Weight: Long Term 263 lb (119.3 kg)    Expected Outcomes Long Term: Adherence to nutrition and physical activity/exercise program aimed toward attainment of established weight goal;Weight Loss: Understanding of general recommendations for a balanced deficit meal plan, which promotes 1-2 lb weight loss per week and includes a negative energy balance of [phone] kcal/d    Personal Goal Other Yes    Personal Goal Lose 10 lbs in program. Learn how to eat healthier and develop an healthy lifestyle overall.    Intervention Patient will participate in CR 3 days/week and supplement with exercise 2 days/week.    Expected Outcomes Patient will meet both program and  personal goals.           Core Components/Risk Factors/Patient Goals Review:    Core Components/Risk Factors/Patient Goals at Discharge (Final Review):    ITP Comments:   Comments: Patient arrived for 1st visit/orientation/education at 0800. Patient was referred to CR by Dr. Angelena Form due to STEMI (I21.3) and Status Post Coronary artery stent placement (Z95.5). During orientation advised patient on arrival and appointment times what to wear, what to do before, during and after exercise. Reviewed attendance and class policy. Talked about inclement weather and class consultation policy. Pt is scheduled to return Cardiac Rehab on 04/09/20 at 8:15. Pt was advised to come to class 15 minutes before class starts. Patient was also given instructions on meeting with the dietician and attending the Family Structure classes. Discussed RPE/Dpysnea scales. Discussed initial THR and how to find their radial and/or carotid pulse. Discussed the initial exercise prescription and how this effects their progress. Pt is eager to get started. Patient participated in warm up stretches followed by light weights and resistance bands. Patient was able to complete 6 minute walk test.  Patient was measured for the equipment. Discussed equipment safety with patient. Took patient pre-anthropometric measurements. Patient finished visit at 9:45.

## 2020-04-02 NOTE — Progress Notes (Signed)
Cardiac/Pulmonary Rehab Medication Review by a Pharmacist  Does the patient  feel that his/her medications are working for him/her?  yes  Has the patient been experiencing any side effects to the medications prescribed?  yes  Does the patient measure his/her own blood pressure or blood glucose at home?  yes   Does the patient have any problems obtaining medications due to transportation or finances?   yes  Understanding of regimen: excellent Understanding of indications: excellent Potential of compliance: excellent  Questions asked to Determine Patient Understanding of Medication Regimen:  1. What is the name of the medication?  2. What is the medication used for?  3. When should it be taken?  4. How much should be taken?  5. How will you take it?  6. What side effects should you report?  Understanding Defined as: Excellent: All questions above are correct Good: Questions 1-4 are correct Fair: Questions 1-2 are correct  Poor: 1 or none of the above questions are correct   Pharmacist comments: [name] understands her medication regimen and was able to explain how she takes her medications. She's experiencing no side effects from medications and she has no issues with procuring medications. She does monitor her own blood pressure bid.    Despina Pole 04/02/2020 8:42 AM

## 2020-04-09 ENCOUNTER — Encounter (HOSPITAL_COMMUNITY)
Admission: RE | Admit: 2020-04-09 | Discharge: 2020-04-09 | Disposition: A | Discharge status: Home / Self Care | Payer: BC Managed Care – PPO | Source: Ambulatory Visit | Attending: Cardiovascular Disease | Admitting: Cardiovascular Disease

## 2020-04-09 ENCOUNTER — Other Ambulatory Visit: Payer: Self-pay

## 2020-04-09 VITALS — Wt 275.4 lb

## 2020-04-09 DIAGNOSIS — I213 ST elevation (STEMI) myocardial infarction of unspecified site: Secondary | ICD-10-CM | POA: Diagnosis not present

## 2020-04-09 DIAGNOSIS — Z955 Presence of coronary angioplasty implant and graft: Secondary | ICD-10-CM

## 2020-04-09 NOTE — Progress Notes (Signed)
Daily Session Note  Patient Details  Name: [name] MRN: [id number] Date of Birth: November 19, 1970 Referring Provider:     CARDIAC REHAB PHASE II ORIENTATION from 04/02/2020 in [place]  Referring Provider Dr. Angelena Form       Encounter Date: 04/09/2020  Check In:  Session Check In - 04/09/20 0830      Check-In   Supervising physician immediately available to respond to emergencies See telemetry face sheet for immediately available ER MD    Location [facility]    Staff Present Algis Downs, Exercise Physiologist;Dalton Kris Mouton, MS, ACSM-CEP, Exercise Physiologist    Virtual Visit No    Medication changes reported     No    Fall or balance concerns reported    No    Tobacco Cessation No Change    Warm-up and Cool-down Performed as group-led instruction    Resistance Training Performed Yes    VAD Patient? No    PAD/SET Patient? No      Pain Assessment   Currently in Pain? No/denies    Pain Score 0-No pain    Multiple Pain Sites No           Capillary Blood Glucose: No results found for this or any previous visit (from the past 24 hour(s)).    Social History   Tobacco Use  Smoking Status Never Smoker  Smokeless Tobacco Never Used    Goals Met:  Independence with exercise equipment Exercise tolerated well No report of cardiac concerns or symptoms Strength training completed today  Goals Unmet:  Not Applicable  Comments: checkout time is 0915   Dr. Kate Sable is Medical Director for [place] and Pulmonary Rehab.

## 2020-04-11 ENCOUNTER — Encounter (HOSPITAL_COMMUNITY)
Admission: RE | Admit: 2020-04-11 | Discharge: 2020-04-11 | Disposition: A | Discharge status: Home / Self Care | Payer: BC Managed Care – PPO | Source: Ambulatory Visit | Attending: Cardiovascular Disease | Admitting: Cardiovascular Disease

## 2020-04-11 ENCOUNTER — Other Ambulatory Visit: Payer: Self-pay

## 2020-04-11 DIAGNOSIS — I213 ST elevation (STEMI) myocardial infarction of unspecified site: Secondary | ICD-10-CM

## 2020-04-11 DIAGNOSIS — Z955 Presence of coronary angioplasty implant and graft: Secondary | ICD-10-CM

## 2020-04-11 NOTE — Progress Notes (Signed)
Daily Session Note  Patient Details  Name: [name] MRN: [id number] Date of Birth: [birth year] Referring Provider:     CARDIAC REHAB PHASE II ORIENTATION from 04/02/2020 in [place]  Referring Provider Dr. Angelena Form       Encounter Date: 04/11/2020  Check In:  Session Check In - 04/11/20 0823      Check-In   Supervising physician immediately available to respond to emergencies See telemetry face sheet for immediately available MD    Location [facility]    Staff Present Algis Downs, Exercise Physiologist;Taunia Frasco Rosezella [place], RN, Madelaine Bhat, RN, BSN    Virtual Visit No    Medication changes reported     No    Fall or balance concerns reported    No    Tobacco Cessation No Change    Warm-up and Cool-down Performed as group-led instruction    Resistance Training Performed Yes    VAD Patient? No    PAD/SET Patient? No      Pain Assessment   Currently in Pain? No/denies    Pain Score 0-No pain    Multiple Pain Sites No           Capillary Blood Glucose: No results found for this or any previous visit (from the past 24 hour(s)).    Social History   Tobacco Use  Smoking Status Never Smoker  Smokeless Tobacco Never Used    Goals Met:  Independence with exercise equipment Exercise tolerated well No report of cardiac concerns or symptoms Strength training completed today  Goals Unmet:  Not Applicable  Comments: session 8:15-9:15   Dr. Kate Sable is Medical Director for [place] and Pulmonary Rehab.

## 2020-04-12 ENCOUNTER — Other Ambulatory Visit: Payer: Self-pay | Admitting: Podiatry

## 2020-04-12 ENCOUNTER — Ambulatory Visit (INDEPENDENT_AMBULATORY_CARE_PROVIDER_SITE_OTHER): Payer: BC Managed Care – PPO

## 2020-04-12 ENCOUNTER — Encounter: Payer: Self-pay | Admitting: Podiatry

## 2020-04-12 ENCOUNTER — Other Ambulatory Visit: Payer: Self-pay

## 2020-04-12 ENCOUNTER — Ambulatory Visit: Payer: BC Managed Care – PPO | Admitting: Podiatry

## 2020-04-12 DIAGNOSIS — M67471 Ganglion, right ankle and foot: Secondary | ICD-10-CM

## 2020-04-12 DIAGNOSIS — M6740 Ganglion, unspecified site: Secondary | ICD-10-CM

## 2020-04-12 NOTE — Progress Notes (Signed)
Subjective:  Patient ID: [name], female    DOB: [birth year],  MRN: [id number] HPI Chief Complaint  Patient presents with  . Cyst    pt was seen by PCP on June10- due to swollen toe- pt states it was bunion but was told it was a cyst. pt wants further evaluation     49 y.o. female presents with the above complaint.   ROS: Denies fever chills nausea vomiting muscle aches pains calf pain back pain chest pain shortness of breath.  Past Medical History:  Diagnosis Date  . CAD (coronary artery disease), native coronary artery    s/p DES to LAD 02/01/20  . Chest pain 01/2020  . Facial numbness   . Fibroid   . Fx ankle   . Hypertension   . Obesity    Past Surgical History:  Procedure Laterality Date  . APPENDECTOMY    . APPLICATION OF CRANIAL NAVIGATION N/A 08/16/2019   Procedure: APPLICATION OF CRANIAL NAVIGATION;  Surgeon: Judith Part, MD;  Location: [place];  Service: Neurosurgery;  Laterality: N/A;  . bilateral breast reduction  2004  . CHOLECYSTECTOMY    . CORONARY STENT INTERVENTION N/A 02/01/2020   Procedure: CORONARY STENT INTERVENTION;  Surgeon: Burnell Blanks, MD;  Location: [facility];  Service: Cardiovascular;  Laterality: N/A;  . CRANIOTOMY Left 08/16/2019   Procedure: Left craniotomy for tumor resection;  Surgeon: Judith Part, MD;  Location: [place];  Service: Neurosurgery;  Laterality: Left;  Left craniotomy for tumor resection  . INTRAVASCULAR PRESSURE WIRE/FFR STUDY N/A 02/01/2020   Procedure: INTRAVASCULAR PRESSURE WIRE/FFR STUDY;  Surgeon: Burnell Blanks, MD;  Location: [facility];  Service: Cardiovascular;  Laterality: N/A;  . LEFT HEART CATH AND CORONARY ANGIOGRAPHY N/A 02/01/2020   Procedure: LEFT HEART CATH AND CORONARY ANGIOGRAPHY;  Surgeon: Burnell Blanks, MD;  Location: [facility];  Service: Cardiovascular;  Laterality: N/A;  . REDUCTION MAMMAPLASTY Bilateral   . removal of left ovarian cyst  2003    . TUBAL LIGATION  2011   Women's, removal of tubes 2013    Current Outpatient Medications:  .  acetaminophen (TYLENOL) 325 MG tablet, Take 325 mg by mouth every 6 (six) hours as needed., Disp: , Rfl:  .  amLODipine (NORVASC) 10 MG tablet, Take 1 tablet by mouth once daily, Disp: 90 tablet, Rfl: 0 .  aspirin 81 MG chewable tablet, Chew 1 tablet (81 mg total) by mouth daily., Disp: 90 tablet, Rfl: 1 .  atorvastatin (LIPITOR) 80 MG tablet, Take 1 tablet (80 mg total) by mouth daily., Disp: 90 tablet, Rfl: 3 .  cloNIDine (CATAPRES) 0.3 MG tablet, TAKE ONE TABLET BY MOUTH AT BEDTIME, Disp: 90 tablet, Rfl: 3 .  labetalol (NORMODYNE) 200 MG tablet, TAKE ONE TABLET BY MOUTH ONCE DAILY EVERY MORNING, Disp: 90 tablet, Rfl: 3 .  lisinopril (ZESTRIL) 10 MG tablet, TAKE 1 TABLET BY MOUTH AT BEDTIME, Disp: 90 tablet, Rfl: 3 .  nitroGLYCERIN (NITROSTAT) 0.4 MG SL tablet, Place 1 tablet (0.4 mg total) under the tongue every 5 (five) minutes as needed., Disp: 25 tablet, Rfl: 3 .  ticagrelor (BRILINTA) 90 MG TABS tablet, Take 1 tablet (90 mg total) by mouth 2 (two) times daily., Disp: 180 tablet, Rfl: 3 .  triamterene-hydrochlorothiazide (MAXZIDE) 75-50 MG tablet, Take 1 tablet by mouth daily., Disp: 90 tablet, Rfl: 1  No Known Allergies Review of Systems Objective:  There were no vitals filed for this visit.  General: Well developed, nourished, in no acute distress, alert and oriented x3   Dermatological: Skin is warm, dry and supple bilateral. Nails x 10 are well maintained; remaining integument appears unremarkable at this time. There are no open sores, no preulcerative lesions, no rash or signs of infection present.  Vascular: Dorsalis Pedis artery and Posterior Tibial artery pedal pulses are 2/4 bilateral with immedate capillary fill time. Pedal hair growth present. No varicosities and no lower extremity edema present bilateral.   Neruologic: Grossly intact via light touch bilateral. Vibratory intact  via tuning fork bilateral. Protective threshold with Semmes Wienstein monofilament intact to all pedal sites bilateral. Patellar and Achilles deep tendon reflexes 2+ bilateral. No Babinski or clonus noted bilateral.   Musculoskeletal: No gross boney pedal deformities bilateral. No pain, crepitus, or limitation noted with foot and ankle range of motion bilateral. Muscular strength 5/5 in all groups tested bilateral.  Fourth toe right foot demonstrates a nonpulsatile mass about a centimeter in diameter overlying the proximal phalanx initially appeared to be a ganglion cyst but once I attempted aspiration it was noted to be firm and we were unable to gain any decompression.  Gait: Unassisted, Nonantalgic.    Radiographs:  Radiographs taken today do not demonstrate any type of acute abnormalities.  Assessment & Plan:   Assessment: Probable fibroma or consolidated ganglion tumor fourth right.  Plan: Discussed etiology pathology conservative surgical therapies at this point consented her for excision soft tissue mass dorsal aspect fourth toe right foot.  Answered all questions regarding these procedures best my ability layman's terms.  She understands and is amenable to it.  We did discuss possibility of recurrence loss of digit loss of limb loss of life need for further surgery.  She understands this and is amenable to it.  We will need to gain cardiac clearance from Dr. Angelena Form in order to stop of her Brilinta for about 3 days prior to surgery.  We can start her on it right after surgery.  He may want to at least wait 6 months after her procedure since it was performed in April.  I will follow-up with her at the time of surgery.     [name] T. [place], [place]

## 2020-04-13 ENCOUNTER — Encounter (HOSPITAL_COMMUNITY)
Admission: RE | Admit: 2020-04-13 | Discharge: 2020-04-13 | Disposition: A | Discharge status: Home / Self Care | Payer: BC Managed Care – PPO | Source: Ambulatory Visit | Attending: Cardiovascular Disease | Admitting: Cardiovascular Disease

## 2020-04-13 DIAGNOSIS — I213 ST elevation (STEMI) myocardial infarction of unspecified site: Secondary | ICD-10-CM | POA: Diagnosis not present

## 2020-04-13 DIAGNOSIS — Z955 Presence of coronary angioplasty implant and graft: Secondary | ICD-10-CM | POA: Diagnosis not present

## 2020-04-13 NOTE — Progress Notes (Signed)
Daily Session Note  Patient Details  Name: LORREE MILLAR MRN: [id number] Date of Birth: 1970/12/31 Referring Provider:     CARDIAC REHAB PHASE II ORIENTATION from 04/02/2020 in [place]  Referring Provider Dr. Angelena Form       Encounter Date: 04/13/2020  Check In:  Session Check In - 04/13/20 0825      Check-In   Supervising physician immediately available to respond to emergencies See telemetry face sheet for immediately available MD    Location [facility]    Staff Present Algis Downs, Exercise Physiologist;Romari Gasparro Kris Mouton, MS, ACSM-CEP, Exercise Physiologist    Virtual Visit No    Medication changes reported     No    Fall or balance concerns reported    No    Tobacco Cessation No Change    Warm-up and Cool-down Performed as group-led instruction    Resistance Training Performed Yes    VAD Patient? No    PAD/SET Patient? No      Pain Assessment   Currently in Pain? No/denies    Pain Score 0-No pain    Multiple Pain Sites No           Capillary Blood Glucose: No results found for this or any previous visit (from the past 24 hour(s)).    Social History   Tobacco Use  Smoking Status Never Smoker  Smokeless Tobacco Never Used    Goals Met:  Independence with exercise equipment Exercise tolerated well Strength training completed today  Goals Unmet:  Not Applicable  Comments: checkout time is 0915   Dr. Kate Sable is Medical Director for [facility].

## 2020-04-16 ENCOUNTER — Encounter (HOSPITAL_COMMUNITY): Payer: BC Managed Care – PPO

## 2020-04-18 ENCOUNTER — Encounter (HOSPITAL_COMMUNITY)
Admission: RE | Admit: 2020-04-18 | Discharge: 2020-04-18 | Disposition: A | Discharge status: Home / Self Care | Payer: BC Managed Care – PPO | Source: Ambulatory Visit | Attending: Cardiovascular Disease | Admitting: Cardiovascular Disease

## 2020-04-18 ENCOUNTER — Other Ambulatory Visit: Payer: Self-pay

## 2020-04-18 DIAGNOSIS — Z955 Presence of coronary angioplasty implant and graft: Secondary | ICD-10-CM | POA: Diagnosis not present

## 2020-04-18 DIAGNOSIS — I213 ST elevation (STEMI) myocardial infarction of unspecified site: Secondary | ICD-10-CM | POA: Diagnosis not present

## 2020-04-18 NOTE — Progress Notes (Signed)
Cardiac Individual Treatment Plan  Patient Details  Name: [name] MRN: [id number] Date of Birth: 30-Dec-1970 Referring Provider:     CARDIAC REHAB PHASE II ORIENTATION from 04/02/2020 in [place]  Referring Provider Dr. Angelena Form       Initial Encounter Date:    CARDIAC REHAB PHASE II ORIENTATION from 04/02/2020 in [place]  Date 04/02/20      Visit Diagnosis: ST elevation myocardial infarction (STEMI), unspecified artery ([place])  Status post coronary artery stent placement  Patient's Home Medications on Admission:  Current Outpatient Medications:  .  acetaminophen (TYLENOL) 325 MG tablet, Take 325 mg by mouth every 6 (six) hours as needed., Disp: , Rfl:  .  amLODipine (NORVASC) 10 MG tablet, Take 1 tablet by mouth once daily, Disp: 90 tablet, Rfl: 0 .  aspirin 81 MG chewable tablet, Chew 1 tablet (81 mg total) by mouth daily., Disp: 90 tablet, Rfl: 1 .  atorvastatin (LIPITOR) 80 MG tablet, Take 1 tablet (80 mg total) by mouth daily., Disp: 90 tablet, Rfl: 3 .  cloNIDine (CATAPRES) 0.3 MG tablet, TAKE ONE TABLET BY MOUTH AT BEDTIME, Disp: 90 tablet, Rfl: 3 .  labetalol (NORMODYNE) 200 MG tablet, TAKE ONE TABLET BY MOUTH ONCE DAILY EVERY MORNING, Disp: 90 tablet, Rfl: 3 .  lisinopril (ZESTRIL) 10 MG tablet, TAKE 1 TABLET BY MOUTH AT BEDTIME, Disp: 90 tablet, Rfl: 3 .  nitroGLYCERIN (NITROSTAT) 0.4 MG SL tablet, Place 1 tablet (0.4 mg total) under the tongue every 5 (five) minutes as needed., Disp: 25 tablet, Rfl: 3 .  ticagrelor (BRILINTA) 90 MG TABS tablet, Take 1 tablet (90 mg total) by mouth 2 (two) times daily., Disp: 180 tablet, Rfl: 3 .  triamterene-hydrochlorothiazide (MAXZIDE) 75-50 MG tablet, Take 1 tablet by mouth daily., Disp: 90 tablet, Rfl: 1  Past Medical History: Past Medical History:  Diagnosis Date  . CAD (coronary artery disease), native coronary artery    s/p DES to LAD 02/01/20  . Chest pain 01/2020  . Facial  numbness   . Fibroid   . Fx ankle   . Hypertension   . Obesity     Tobacco Use: Social History   Tobacco Use  Smoking Status Never Smoker  Smokeless Tobacco Never Used    Labs: Recent Review Flowsheet Data    Labs for ITP Cardiac and Pulmonary Rehab Latest Ref Rng & Units 11/05/2017 08/16/2019 08/16/2019 02/02/2020 04/02/2020   Cholestrol 100 - 199 mg/dL 161 - - 162 128   LDLCALC 0 - 99 mg/dL 91 - - 92 54   HDL >39 mg/dL 52 - - 55 60   Trlycerides 0 - 149 mg/dL 89 - - 75 67   Hemoglobin A1c 4.8 - 5.6 % 5.0 - - 5.0 -   PHART 7.35 - 7.45 - 7.387 - - -   PCO2ART 32 - 48 mmHg - 40.1 - - -   HCO3 20.0 - 28.0 mmol/L - 24.5 - - -   TCO2 22 - 32 mmol/L - 26 24 - -   ACIDBASEDEF 0.0 - 2.0 mmol/L - 1.0 - - -   O2SAT % - 99.0 - - -      Capillary Blood Glucose: Lab Results  Component Value Date   GLUCAP 116 (H) 11/06/2009   GLUCAP 112 (H) 08/22/2009   GLUCAP 139 (H) 08/21/2009   GLUCAP 131 (H) 08/21/2009     Exercise Target Goals: Exercise Program Goal: Individual exercise prescription set using results from  initial 6 min walk test and THRR while considering  patient's activity barriers and safety.   Exercise Prescription Goal: Starting with aerobic activity 30 plus minutes a day, 3 days per week for initial exercise prescription. Provide home exercise prescription and guidelines that participant acknowledges understanding prior to discharge.  Activity Barriers & Risk Stratification:  Activity Barriers & Cardiac Risk Stratification - 04/02/20 1012      Activity Barriers & Cardiac Risk Stratification   Activity Barriers Deconditioning    Cardiac Risk Stratification High           6 Minute Walk:  6 Minute Walk    Row Name 04/02/20 0956         6 Minute Walk   Phase Initial     Distance 1200 feet     Walk Time 6 minutes     # of Rest Breaks 0     MPH 2.27     METS 2.9     RPE 10     VO2 Peak 10.13     Symptoms No     Resting HR 87 bpm     Resting BP 110/72       Resting Oxygen Saturation  98 %     Exercise Oxygen Saturation  during 6 min walk 99 %     Max Ex. HR 100 bpm     Max Ex. BP 130/90     2 Minute Post BP 110/82            Oxygen Initial Assessment:   Oxygen Re-Evaluation:   Oxygen Discharge (Final Oxygen Re-Evaluation):   Initial Exercise Prescription:  Initial Exercise Prescription - 04/02/20 0900      Date of Initial Exercise RX and Referring Provider   Date 04/02/20    Referring Provider Dr. Angelena Form     Expected Discharge Date 07/03/20      Treadmill   MPH 1.5    Grade 0    Minutes 17    METs 2.14      T5 Nustep   Level 1    SPM 85    Minutes 22      Prescription Details   Frequency (times per week) 3    Duration Progress to 30 minutes of continuous aerobic without signs/symptoms of physical distress      Intensity   THRR 40-80% of Max Heartrate 69-138    Ratings of Perceived Exertion 11-13    Perceived Dyspnea 0-4      Resistance Training   Training Prescription Yes    Weight 2 lbs    Reps 10-15           Perform Capillary Blood Glucose checks as needed.  Exercise Prescription Changes:   Exercise Prescription Changes    Row Name 04/09/20 1100             Response to Exercise   Blood Pressure (Admit) 124/86       Blood Pressure (Exercise) 136/80       Blood Pressure (Exit) 124/86       Heart Rate (Admit) 107 bpm       Heart Rate (Exercise) 127 bpm       Heart Rate (Exit) 113 bpm       Rating of Perceived Exertion (Exercise) 12       Duration Continue with 30 min of aerobic exercise without signs/symptoms of physical distress.       Intensity THRR unchanged  Progression   Progression Continue to progress workloads to maintain intensity without signs/symptoms of physical distress.         Resistance Training   Training Prescription Yes       Weight 2 lbs       Reps 10-15         Treadmill   MPH 1.5       Grade 0       Minutes 17       METs 2.14         T5 Nustep    Level 1       SPM 105       Minutes 22       METs 2.1              Exercise Comments:   Exercise Goals and Review:   Exercise Goals    Row Name 04/02/20 1002             Exercise Goals   Increase Physical Activity Yes       Intervention Provide advice, education, support and counseling about physical activity/exercise needs.;Develop an individualized exercise prescription for aerobic and resistive training based on initial evaluation findings, risk stratification, comorbidities and participant's personal goals.       Expected Outcomes Short Term: Attend rehab on a regular basis to increase amount of physical activity.;Long Term: Add in home exercise to make exercise part of routine and to increase amount of physical activity.;Long Term: Exercising regularly at least 3-5 days a week.       Increase Strength and Stamina Yes       Intervention Provide advice, education, support and counseling about physical activity/exercise needs.;Develop an individualized exercise prescription for aerobic and resistive training based on initial evaluation findings, risk stratification, comorbidities and participant's personal goals.       Expected Outcomes Short Term: Increase workloads from initial exercise prescription for resistance, speed, and METs.;Short Term: Perform resistance training exercises routinely during rehab and add in resistance training at home;Long Term: Improve cardiorespiratory fitness, muscular endurance and strength as measured by increased METs and functional capacity (6MWT)       Able to understand and use rate of perceived exertion (RPE) scale Yes       Intervention Provide education and explanation on how to use RPE scale       Expected Outcomes Short Term: Able to use RPE daily in rehab to express subjective intensity level;Long Term:  Able to use RPE to guide intensity level when exercising independently       Knowledge and understanding of Target Heart Rate Range (THRR)  Yes       Intervention Provide education and explanation of THRR including how the numbers were predicted and where they are located for reference       Expected Outcomes Short Term: Able to state/look up THRR;Long Term: Able to use THRR to govern intensity when exercising independently;Short Term: Able to use daily as guideline for intensity in rehab       Able to check pulse independently Yes       Intervention Review the importance of being able to check your own pulse for safety during independent exercise;Provide education and demonstration on how to check pulse in carotid and radial arteries.       Expected Outcomes Short Term: Able to explain why pulse checking is important during independent exercise;Long Term: Able to check pulse independently and accurately       Understanding of  Exercise Prescription Yes       Intervention Provide education, explanation, and written materials on patient's individual exercise prescription       Expected Outcomes Short Term: Able to explain program exercise prescription;Long Term: Able to explain home exercise prescription to exercise independently              Exercise Goals Re-Evaluation :    Discharge Exercise Prescription (Final Exercise Prescription Changes):  Exercise Prescription Changes - 04/09/20 1100      Response to Exercise   Blood Pressure (Admit) 124/86    Blood Pressure (Exercise) 136/80    Blood Pressure (Exit) 124/86    Heart Rate (Admit) 107 bpm    Heart Rate (Exercise) 127 bpm    Heart Rate (Exit) 113 bpm    Rating of Perceived Exertion (Exercise) 12    Duration Continue with 30 min of aerobic exercise without signs/symptoms of physical distress.    Intensity THRR unchanged      Progression   Progression Continue to progress workloads to maintain intensity without signs/symptoms of physical distress.      Resistance Training   Training Prescription Yes    Weight 2 lbs    Reps 10-15      Treadmill   MPH 1.5     Grade 0    Minutes 17    METs 2.14      T5 Nustep   Level 1    SPM 105    Minutes 22    METs 2.1           Nutrition:  Target Goals: Understanding of nutrition guidelines, daily intake of sodium 1500mg , cholesterol 200mg , calories 30% from fat and 7% or less from saturated fats, daily to have 5 or more servings of fruits and vegetables.  Biometrics:  Pre Biometrics - 04/02/20 1003      Pre Biometrics   Height 5\' 4"  (1.626 m)    Weight 123.8 kg    Waist Circumference 48 inches    Hip Circumference 54 inches    Waist to Hip Ratio 0.89 %    BMI (Calculated) 46.83    Triceps Skinfold 35 mm    % Body Fat 53.7 %    Grip Strength 40 kg    Flexibility 12 in    Single Leg Stand 12.37 seconds            Nutrition Therapy Plan and Nutrition Goals:  Nutrition Therapy & Goals - 04/12/20 1601      Personal Nutrition Goals   Comments Patient continues to eat a low fat diet and trys to eat healthy overall. She continues to work on her soda intake and is doing 3/day. She is interested in attending our RD class. In the intermin, we are providing education through handouts.      Intervention Plan   Intervention Nutrition handout(s) given to patient.           Nutrition Assessments:  Nutrition Assessments - 04/02/20 0948      MEDFICTS Scores   Pre Score 12           Nutrition Goals Re-Evaluation:   Nutrition Goals Discharge (Final Nutrition Goals Re-Evaluation):   Psychosocial: Target Goals: Acknowledge presence or absence of significant depression and/or stress, maximize coping skills, provide positive support system. Participant is able to verbalize types and ability to use techniques and skills needed for reducing stress and depression.  Initial Review & Psychosocial Screening:  Initial Psych Review & Screening - 04/02/20  1025      Initial Review   Current issues with None Identified      Family Dynamics   Good Support System? Yes      Barriers    Psychosocial barriers to participate in program There are no identifiable barriers or psychosocial needs.      Screening Interventions   Interventions Encouraged to exercise;To provide support and resources with identified psychosocial needs;Provide feedback about the scores to participant    Expected Outcomes Short Term goal: Identification and review with participant of any Quality of Life or Depression concerns found by scoring the questionnaire.;Long Term goal: The participant improves quality of Life and PHQ9 Scores as seen by post scores and/or verbalization of changes           Quality of Life Scores:  Quality of Life - 04/02/20 0955      Quality of Life   Select Quality of Life      Quality of Life Scores   Health/Function Pre 18 %    Socioeconomic Pre 30 %    Psych/Spiritual Pre 29.14 %    Family Pre 30 %    GLOBAL Pre 24.69 %          Scores of 19 and below usually indicate a poorer quality of life in these areas.  A difference of  2-3 points is a clinically meaningful difference.  A difference of 2-3 points in the total score of the Quality of Life Index has been associated with significant improvement in overall quality of life, self-image, physical symptoms, and general health in studies assessing change in quality of life.  PHQ-9: Recent Review Flowsheet Data    Depression screen [facility] 2/9 04/02/2020 03/20/2020 05/12/2019 07/27/2018 01/05/2018   Decreased Interest 0 0 0 0 -   Down, Depressed, Hopeless 0 0 0 0 0   PHQ - 2 Score 0 0 0 0 0   Altered sleeping 0 - - - -   Tired, decreased energy 1 - - - -   Change in appetite 1 - - - -   Feeling bad or failure about yourself  0 - - - -   Trouble concentrating 0 - - - -   Moving slowly or fidgety/restless 0 - - - -   Suicidal thoughts 0 - - - -   PHQ-9 Score 2 - - - -   Difficult doing work/chores Somewhat difficult - - - -     Interpretation of Total Score  Total Score Depression Severity:  1-4 = Minimal depression,  5-9 = Mild depression, 10-14 = Moderate depression, 15-19 = Moderately severe depression, 20-27 = Severe depression   Psychosocial Evaluation and Intervention:  Psychosocial Evaluation - 04/02/20 1026      Psychosocial Evaluation & Interventions   Interventions Encouraged to exercise with the program and follow exercise prescription;Stress management education;Relaxation education    Comments Patient's initial QOL score was 24.96 and her PHQ_9 score was2. She has no psychosocial issues identified. She rated her stress level as low. 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 Will continue to montior.    Expected Outcomes Patient will have no psychosocial issues identifed at discharge.    Continue Psychosocial Services  No Follow up required           Psychosocial Re-Evaluation:  Psychosocial Re-Evaluation    [place] Name 04/12/20 [phone]  Psychosocial Re-Evaluation   Current issues with None Identified       Comments Patient's initial QOL score was 24.69% and her PHQ-9 score was 2 with no psychosocial issues identified. Will continue to monitor.       Expected Outcomes Patient will have no psychosocial issues identified at discharge.       Interventions Encouraged to attend Cardiac Rehabilitation for the exercise;Stress management education;Relaxation education       Continue Psychosocial Services  No Follow up required              Psychosocial Discharge (Final Psychosocial Re-Evaluation):  Psychosocial Re-Evaluation - 04/12/20 1602      Psychosocial Re-Evaluation   Current issues with None Identified    Comments Patient's initial QOL score was 24.69% and her PHQ-9 score was 2 with no psychosocial issues identified. Will continue to monitor.    Expected Outcomes Patient will have no psychosocial issues identified at discharge.    Interventions Encouraged to attend Cardiac Rehabilitation for the exercise;Stress management  education;Relaxation education    Continue Psychosocial Services  No Follow up required           Vocational Rehabilitation: Provide vocational rehab assistance to qualifying candidates.   Vocational Rehab Evaluation & Intervention:  Vocational Rehab - 04/02/20 0949      Initial Vocational Rehab Evaluation & Intervention   Assessment shows need for Vocational Rehabilitation No      Vocational Rehab Re-Evaulation   Comments Patient has returned to work working full-time.           Education: Education Goals: Education classes will be provided on a weekly basis, covering required topics. Participant will state understanding/return demonstration of topics presented.  Learning Barriers/Preferences:  Learning Barriers/Preferences - 04/02/20 0948      Learning Barriers/Preferences   Learning Barriers None    Learning Preferences Skilled Demonstration;Written Material;Audio;Group Instruction           Education Topics: Hypertension, Hypertension Reduction -Define heart disease and high blood pressure. Discus how high blood pressure affects the body and ways to reduce high blood pressure.   Exercise and Your Heart -Discuss why it is important to exercise, the FITT principles of exercise, normal and abnormal responses to exercise, and how to exercise safely.   Angina -Discuss definition of angina, causes of angina, treatment of angina, and how to decrease risk of having angina.   Cardiac Medications -Review what the following cardiac medications are used for, how they affect the body, and side effects that may occur when taking the medications.  Medications include Aspirin, Beta blockers, calcium channel blockers, ACE Inhibitors, angiotensin receptor blockers, diuretics, digoxin, and antihyperlipidemics.   Congestive Heart Failure -Discuss the definition of CHF, how to live with CHF, the signs and symptoms of CHF, and how keep track of weight and sodium  intake.   Heart Disease and Intimacy -Discus the effect sexual activity has on the heart, how changes occur during intimacy as we age, and safety during sexual activity.   Smoking Cessation / COPD -Discuss different methods to quit smoking, the health benefits of quitting smoking, and the definition of COPD.   Nutrition I: Fats -Discuss the types of cholesterol, what cholesterol does to the heart, and how cholesterol levels can be controlled.   Nutrition II: Labels -Discuss the different components of food labels and how to read food label   Heart Parts/Heart Disease and PAD -Discuss the anatomy of the heart, the pathway of blood circulation through the heart,  and these are affected by heart disease.   Stress I: Signs and Symptoms -Discuss the causes of stress, how stress may lead to anxiety and depression, and ways to limit stress.   Stress II: Relaxation -Discuss different types of relaxation techniques to limit stress.   Warning Signs of Stroke / TIA -Discuss definition of a stroke, what the signs and symptoms are of a stroke, and how to identify when someone is having stroke.   CARDIAC REHAB PHASE II EXERCISE from 04/11/2020 in [place]  Date 04/11/20  Educator Etheleen Mayhew  Instruction Review Code 1- Verbalizes Understanding      Knowledge Questionnaire Score:  Knowledge Questionnaire Score - 04/02/20 0949      Knowledge Questionnaire Score   Pre Score 9/24           Core Components/Risk Factors/Patient Goals at Admission:  Personal Goals and Risk Factors at Admission - 04/02/20 0949      Core Components/Risk Factors/Patient Goals on Admission    Weight Management Yes    Intervention Weight Management: Develop a combined nutrition and exercise program designed to reach desired caloric intake, while maintaining appropriate intake of nutrient and fiber, sodium and fats, and appropriate energy expenditure required for the weight  goal.;Weight Management: Provide education and appropriate resources to help participant work on and attain dietary goals.;Weight Management/Obesity: Establish reasonable short term and long term weight goals.;Obesity: Provide education and appropriate resources to help participant work on and attain dietary goals.    Admit Weight 273 lb (123.8 kg)    Goal Weight: Short Term 268 lb (121.6 kg)    Goal Weight: Long Term 263 lb (119.3 kg)    Expected Outcomes Long Term: Adherence to nutrition and physical activity/exercise program aimed toward attainment of established weight goal;Weight Loss: Understanding of general recommendations for a balanced deficit meal plan, which promotes 1-2 lb weight loss per week and includes a negative energy balance of [phone] kcal/d    Personal Goal Other Yes    Personal Goal Lose 10 lbs in program. Learn how to eat healthier and develop an healthy lifestyle overall.    Intervention Patient will participate in CR 3 days/week and supplement with exercise 2 days/week.    Expected Outcomes Patient will meet both program and personal goals.           Core Components/Risk Factors/Patient Goals Review:   Goals and Risk Factor Review    Row Name 04/12/20 1603             Core Components/Risk Factors/Patient Goals Review   Personal Goals Review Weight Management/Obesity;Other  Lose 10 lbs; Improve eating habits; live a healthier life.       Review Patient is new to the program completing 3 sessions. She is looking forward to meeting her personal goals. Will continue to monitor for progress.       Expected Outcomes Patient will continue to attend sessions and complete the program meeting both program and personal goals.              Core Components/Risk Factors/Patient Goals at Discharge (Final Review):   Goals and Risk Factor Review - 04/12/20 1603      Core Components/Risk Factors/Patient Goals Review   Personal Goals Review Weight Management/Obesity;Other    Lose 10 lbs; Improve eating habits; live a healthier life.   Review Patient is new to the program completing 3 sessions. She is looking forward to meeting her personal goals. Will continue to monitor for  progress.    Expected Outcomes Patient will continue to attend sessions and complete the program meeting both program and personal goals.           ITP Comments:   Comments: ITP REVIEW Pt is making expected progress toward Cardiac Rehab goals after completing 3 sessions. Recommend continued exercise, life style modification, education, and increased stamina and strength.

## 2020-04-18 NOTE — Progress Notes (Signed)
Daily Session Note  Patient Details  Name: [name] MRN: [id number] Date of Birth: 10/26/1970 Referring Provider:     CARDIAC REHAB PHASE II ORIENTATION from 04/02/2020 in [place] [facility]  Referring Provider Dr. McAlhany       Encounter Date: 04/18/2020  Check In:  Session Check In - 04/18/20 0817      Check-In   Supervising physician immediately available to respond to emergencies See telemetry face sheet for immediately available MD    Location [facility]    Staff Present Vaniece Hatchett, Exercise Physiologist;Dalton Fletcher, MS, ACSM-CEP, Exercise Physiologist;Debra Johnson, RN, BSN    Virtual Visit No    Medication changes reported     No    Fall or balance concerns reported    No    Tobacco Cessation No Change    Warm-up and Cool-down Performed as group-led instruction    Resistance Training Performed Yes    VAD Patient? No    PAD/SET Patient? No      Pain Assessment   Currently in Pain? No/denies    Pain Score 0-No pain    Multiple Pain Sites No           Capillary Blood Glucose: No results found for this or any previous visit (from the past 24 hour(s)).    Social History   Tobacco Use  Smoking Status Never Smoker  Smokeless Tobacco Never Used    Goals Met:  Independence with exercise equipment Exercise tolerated well No report of cardiac concerns or symptoms Strength training completed today  Goals Unmet:  Not Applicable  Comments: checkout time is 0915   Dr. Suresh Koneswaran is Medical Director for [facility]. 

## 2020-04-20 ENCOUNTER — Other Ambulatory Visit: Payer: Self-pay

## 2020-04-20 ENCOUNTER — Encounter (HOSPITAL_COMMUNITY)
Admission: RE | Admit: 2020-04-20 | Discharge: 2020-04-20 | Disposition: A | Discharge status: Home / Self Care | Payer: BC Managed Care – PPO | Source: Ambulatory Visit | Attending: Cardiovascular Disease | Admitting: Cardiovascular Disease

## 2020-04-20 VITALS — Wt 272.3 lb

## 2020-04-20 DIAGNOSIS — Z955 Presence of coronary angioplasty implant and graft: Secondary | ICD-10-CM | POA: Diagnosis not present

## 2020-04-20 DIAGNOSIS — I213 ST elevation (STEMI) myocardial infarction of unspecified site: Secondary | ICD-10-CM | POA: Diagnosis not present

## 2020-04-20 NOTE — Progress Notes (Signed)
Daily Session Note  Patient Details  Name: [name] MRN: [id number] Date of Birth: 05-24-1971 Referring Provider:     CARDIAC REHAB PHASE II ORIENTATION from 04/02/2020 in [place]  Referring Provider Dr. Angelena Form       Encounter Date: 04/20/2020  Check In:  Session Check In - 04/20/20 0819      Check-In   Supervising physician immediately available to respond to emergencies See telemetry face sheet for immediately available MD    Location [facility]    Staff Present Algis Downs, Exercise Physiologist;Christan Defranco Kris Mouton, MS, ACSM-CEP, Exercise Physiologist    Virtual Visit No    Medication changes reported     No    Fall or balance concerns reported    No    Tobacco Cessation No Change    Warm-up and Cool-down Performed as group-led instruction    Resistance Training Performed Yes    VAD Patient? No    PAD/SET Patient? No      Pain Assessment   Currently in Pain? No/denies    Pain Score 0-No pain    Multiple Pain Sites No           Capillary Blood Glucose: No results found for this or any previous visit (from the past 24 hour(s)).    Social History   Tobacco Use  Smoking Status Never Smoker  Smokeless Tobacco Never Used    Goals Met:  Independence with exercise equipment Exercise tolerated well No report of cardiac concerns or symptoms Strength training completed today  Goals Unmet:  Not Applicable  Comments: checkout time is 0915   Dr. Kathie Dike is Medical Director for [facility].

## 2020-04-23 ENCOUNTER — Encounter (HOSPITAL_COMMUNITY): Payer: BC Managed Care – PPO

## 2020-04-25 ENCOUNTER — Encounter (HOSPITAL_COMMUNITY)
Admission: RE | Admit: 2020-04-25 | Discharge: 2020-04-25 | Disposition: A | Discharge status: Home / Self Care | Payer: BC Managed Care – PPO | Source: Ambulatory Visit | Attending: Cardiovascular Disease | Admitting: Cardiovascular Disease

## 2020-04-25 ENCOUNTER — Other Ambulatory Visit: Payer: Self-pay

## 2020-04-25 DIAGNOSIS — Z955 Presence of coronary angioplasty implant and graft: Secondary | ICD-10-CM

## 2020-04-25 DIAGNOSIS — I213 ST elevation (STEMI) myocardial infarction of unspecified site: Secondary | ICD-10-CM

## 2020-04-25 NOTE — Progress Notes (Signed)
Daily Session Note  Patient Details  Name: [name] MRN: [id number] Date of Birth: [birth year] Referring Provider:     CARDIAC REHAB PHASE II ORIENTATION from 04/02/2020 in [place]  Referring Provider Dr. Angelena Form       Encounter Date: 04/25/2020  Check In:  Session Check In - 04/25/20 0815      Check-In   Supervising physician immediately available to respond to emergencies See telemetry face sheet for immediately available MD    Location [facility]    Staff Present Algis Downs, Exercise Physiologist;[place]ton Kris Mouton, MS, ACSM-CEP, Exercise Physiologist;Carlette Wilber Oliphant, RN, Jennye Moccasin, RN, BSN    Virtual Visit No    Medication changes reported     No    Fall or balance concerns reported    No    Tobacco Cessation No Change    Warm-up and Cool-down Performed as group-led instruction    Resistance Training Performed Yes    VAD Patient? No    PAD/SET Patient? No      Pain Assessment   Currently in Pain? No/denies    Pain Score 0-No pain    Multiple Pain Sites No           Capillary Blood Glucose: No results found for this or any previous visit (from the past 24 hour(s)).    Social History   Tobacco Use  Smoking Status Never Smoker  Smokeless Tobacco Never Used    Goals Met:  Independence with exercise equipment Exercise tolerated well No report of cardiac concerns or symptoms Strength training completed today  Goals Unmet:  Not Applicable  Comments: Check out 915.   Dr. Kathie Dike is Medical Director for [facility].

## 2020-04-27 ENCOUNTER — Other Ambulatory Visit: Payer: Self-pay

## 2020-04-27 ENCOUNTER — Encounter (HOSPITAL_COMMUNITY)
Admission: RE | Admit: 2020-04-27 | Discharge: 2020-04-27 | Disposition: A | Discharge status: Home / Self Care | Payer: BC Managed Care – PPO | Source: Ambulatory Visit | Attending: Cardiovascular Disease | Admitting: Cardiovascular Disease

## 2020-04-27 DIAGNOSIS — Z955 Presence of coronary angioplasty implant and graft: Secondary | ICD-10-CM

## 2020-04-27 DIAGNOSIS — I213 ST elevation (STEMI) myocardial infarction of unspecified site: Secondary | ICD-10-CM

## 2020-04-27 NOTE — Progress Notes (Signed)
Daily Session Note  Patient Details  Name: [name] MRN: [id number] Date of Birth: 06-19-1971 Referring Provider:     CARDIAC REHAB PHASE II ORIENTATION from 04/02/2020 in [place]  Referring Provider Dr. Angelena Form       Encounter Date: 04/27/2020  Check In:  Session Check In - 04/27/20 0840      Check-In   Supervising physician immediately available to respond to emergencies See telemetry face sheet for immediately available MD    Location [facility]    Staff Present Algis Downs, Exercise Physiologist;Rayvion Stumph Kris Mouton, MS, ACSM-CEP, Exercise Physiologist;Carlette Wilber Oliphant, RN, BSN    Virtual Visit No    Medication changes reported     No    Fall or balance concerns reported    No    Tobacco Cessation No Change    Warm-up and Cool-down Performed as group-led instruction    Resistance Training Performed Yes    VAD Patient? No    PAD/SET Patient? No      Pain Assessment   Currently in Pain? No/denies    Pain Score 0-No pain    Multiple Pain Sites No           Capillary Blood Glucose: No results found for this or any previous visit (from the past 24 hour(s)).    Social History   Tobacco Use  Smoking Status Never Smoker  Smokeless Tobacco Never Used    Goals Met:  Independence with exercise equipment Exercise tolerated well No report of cardiac concerns or symptoms Strength training completed today  Goals Unmet:  Not Applicable  Comments: checkout time is 0915   Dr. Kathie Dike is Medical Director for [facility].

## 2020-04-30 ENCOUNTER — Encounter (HOSPITAL_COMMUNITY): Payer: BC Managed Care – PPO

## 2020-05-02 ENCOUNTER — Encounter (HOSPITAL_COMMUNITY): Payer: BC Managed Care – PPO

## 2020-05-02 ENCOUNTER — Telehealth: Payer: Self-pay

## 2020-05-02 NOTE — Telephone Encounter (Signed)
DOS 05/11/2020  EXC GANGLION TOE 4TH RT - 28092  [employer] EFFECTIVE DATE - 10/14/2019  PLAN DEDUCTIBLE - $0.00 OUT OF POCKET - $3000.00 W/ $1905.00 REMAINING COPAY $0.00 COINSURANCE - 20%  PER JIM AT [employer], NO PRECERT REQUIRED FOR CPT 28092. CALL REF# [id number]

## 2020-05-04 ENCOUNTER — Encounter (HOSPITAL_COMMUNITY): Payer: BC Managed Care – PPO

## 2020-05-07 ENCOUNTER — Encounter (HOSPITAL_COMMUNITY)
Admission: RE | Admit: 2020-05-07 | Discharge: 2020-05-07 | Disposition: A | Discharge status: Home / Self Care | Payer: BC Managed Care – PPO | Source: Ambulatory Visit | Attending: Cardiovascular Disease | Admitting: Cardiovascular Disease

## 2020-05-07 ENCOUNTER — Other Ambulatory Visit: Payer: Self-pay

## 2020-05-07 VITALS — Wt 277.8 lb

## 2020-05-07 DIAGNOSIS — Z955 Presence of coronary angioplasty implant and graft: Secondary | ICD-10-CM

## 2020-05-07 DIAGNOSIS — I213 ST elevation (STEMI) myocardial infarction of unspecified site: Secondary | ICD-10-CM | POA: Diagnosis not present

## 2020-05-07 NOTE — Progress Notes (Signed)
Daily Session Note  Patient Details  Name: [name] MRN: [id number] Date of Birth: [birth year] Referring Provider:     CARDIAC REHAB PHASE II ORIENTATION from 04/02/2020 in [place]  Referring Provider Dr. Angelena Form       Encounter Date: 05/07/2020  Check In:  Session Check In - 05/07/20 0832      Check-In   Supervising physician immediately available to respond to emergencies See telemetry face sheet for immediately available MD    Location [facility]    Staff Present Algis Downs, Exercise Physiologist;Amylee Lodato Kris Mouton, MS, ACSM-CEP, Exercise Physiologist;Carlette Wilber Oliphant, RN, BSN    Virtual Visit No    Medication changes reported     No    Fall or balance concerns reported    No    Tobacco Cessation No Change    Warm-up and Cool-down Performed as group-led instruction    Resistance Training Performed Yes    VAD Patient? No    PAD/SET Patient? No      Pain Assessment   Currently in Pain? No/denies    Pain Score 0-No pain    Multiple Pain Sites No           Capillary Blood Glucose: No results found for this or any previous visit (from the past 24 hour(s)).    Social History   Tobacco Use  Smoking Status Never Smoker  Smokeless Tobacco Never Used    Goals Met:  Independence with exercise equipment Exercise tolerated well No report of cardiac concerns or symptoms Strength training completed today  Goals Unmet:  Not Applicable  Comments: checkout time is 0915   Dr. Kathie Dike is Medical Director for [facility].

## 2020-05-09 ENCOUNTER — Other Ambulatory Visit: Payer: Self-pay | Admitting: Podiatry

## 2020-05-09 ENCOUNTER — Encounter (HOSPITAL_COMMUNITY)
Admission: RE | Admit: 2020-05-09 | Discharge: 2020-05-09 | Disposition: A | Discharge status: Home / Self Care | Payer: BC Managed Care – PPO | Source: Ambulatory Visit | Attending: Cardiovascular Disease | Admitting: Cardiovascular Disease

## 2020-05-09 ENCOUNTER — Other Ambulatory Visit: Payer: Self-pay

## 2020-05-09 DIAGNOSIS — Z955 Presence of coronary angioplasty implant and graft: Secondary | ICD-10-CM | POA: Diagnosis not present

## 2020-05-09 DIAGNOSIS — I213 ST elevation (STEMI) myocardial infarction of unspecified site: Secondary | ICD-10-CM | POA: Diagnosis not present

## 2020-05-09 MED ORDER — ONDANSETRON HCL 4 MG PO TABS: 4.0000 mg | ORAL_TABLET | Freq: Three times a day (TID) | ORAL | 0 refills | Status: DC | PRN

## 2020-05-09 MED ORDER — CEPHALEXIN 500 MG PO CAPS: 500.0000 mg | ORAL_CAPSULE | Freq: Three times a day (TID) | ORAL | 0 refills | Status: DC

## 2020-05-09 MED ORDER — HYDROCODONE-ACETAMINOPHEN 10-325 MG PO TABS: 1.0000 | ORAL_TABLET | Freq: Four times a day (QID) | ORAL | 0 refills | Status: AC | PRN

## 2020-05-09 NOTE — Progress Notes (Signed)
Daily Session Note  Patient Details  Name: [name] MRN: [id number] Date of Birth: 06-08-1971 Referring Provider:     CARDIAC REHAB PHASE II ORIENTATION from 04/02/2020 in [place]  Referring Provider Dr. Angelena Form       Encounter Date: 05/09/2020  Check In:  Session Check In - 05/09/20 0837      Check-In   Supervising physician immediately available to respond to emergencies See telemetry face sheet for immediately available MD    Location [facility]    Staff Present Algis Downs, Exercise Physiologist;Carollynn Pennywell Kris Mouton, MS, ACSM-CEP, Exercise Physiologist;Carlette Wilber Oliphant, RN, BSN    Virtual Visit No    Medication changes reported     No    Fall or balance concerns reported    No    Tobacco Cessation No Change    Warm-up and Cool-down Performed as group-led instruction    Resistance Training Performed Yes    VAD Patient? No    PAD/SET Patient? No      Pain Assessment   Currently in Pain? No/denies    Pain Score 0-No pain    Multiple Pain Sites No           Capillary Blood Glucose: No results found for this or any previous visit (from the past 24 hour(s)).    Social History   Tobacco Use  Smoking Status Never Smoker  Smokeless Tobacco Never Used    Goals Met:  Independence with exercise equipment Exercise tolerated well No report of cardiac concerns or symptoms Strength training completed today  Goals Unmet:  Not Applicable  Comments: checkout time is 0915   Dr. Kathie Dike is Medical Director for [facility].

## 2020-05-10 ENCOUNTER — Telehealth: Payer: Self-pay | Admitting: Cardiovascular Disease

## 2020-05-10 NOTE — Telephone Encounter (Signed)
   [facility] Pre-operative Risk Assessment    Request for surgical clearance:  1. What type of surgery is being performed? 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 When is this surgery scheduled? 05/11/20   3. What type of clearance is required (medical clearance vs. Pharmacy clearance to hold med vs. Both)? Medical Clearance  4. Are there any medications that need to be held prior to surgery and how long? No  5. Practice name and name of physician performing surgery? [facility]  Dr. Roselind Messier   6. What is your office phone number? [phone]    7.   What is your office fax number? [phone]  8.   Anesthesia type (None, local, MAC, general) ? General   Zara Council 05/10/2020, 3:35 PM  _________________________________________________________________   (provider comments below)

## 2020-05-11 ENCOUNTER — Encounter (HOSPITAL_COMMUNITY): Payer: BC Managed Care – PPO

## 2020-05-11 NOTE — Telephone Encounter (Signed)
   Primary Cardiologist: [name], MD  Chart reviewed as part of pre-operative protocol coverage. Given past medical history and time since last visit, based on ACC/AHA guidelines, [name] would be at acceptable risk for the planned procedure without further cardiovascular testing.   Patient underwent emergent cardiac catheterization with PTCA/DES x1 on 01/2020.  She is on DAPT with ASA and Brilinta.  She will need to maintain her medications through the surgery and is unable to stop her antiplatelet medication at this time.  I will route this recommendation to the requesting party via Epic fax function and remove from pre-op pool.  Please call with questions.  Sheri Ng. Cleaver NP-C    05/11/2020, [facility] [place] [address] Office [phone] Fax [phone]

## 2020-05-14 ENCOUNTER — Encounter (HOSPITAL_COMMUNITY): Payer: BC Managed Care – PPO

## 2020-05-16 ENCOUNTER — Other Ambulatory Visit: Payer: Self-pay

## 2020-05-16 ENCOUNTER — Encounter (HOSPITAL_COMMUNITY)
Admission: RE | Admit: 2020-05-16 | Discharge: 2020-05-16 | Disposition: A | Discharge status: Home / Self Care | Payer: BC Managed Care – PPO | Source: Ambulatory Visit | Attending: Cardiovascular Disease | Admitting: Cardiovascular Disease

## 2020-05-16 ENCOUNTER — Other Ambulatory Visit: Payer: Self-pay | Admitting: Podiatry

## 2020-05-16 VITALS — Wt 277.8 lb

## 2020-05-16 DIAGNOSIS — Z955 Presence of coronary angioplasty implant and graft: Secondary | ICD-10-CM | POA: Diagnosis not present

## 2020-05-16 DIAGNOSIS — I213 ST elevation (STEMI) myocardial infarction of unspecified site: Secondary | ICD-10-CM | POA: Insufficient documentation

## 2020-05-16 NOTE — Progress Notes (Signed)
I have reviewed a Home Exercise Prescription with [name] . [name] is currently exercising at home.  The patient was advised to walk 2-3 days a week for 30-45 minutes.  Kanyia and I discussed how to progress their exercise prescription.  The patient stated that their goals were lose 10 lbs improve eating habits, and healthier lifesytle.  The patient stated that they understand the exercise prescription.  We reviewed exercise guidelines, target heart rate during exercise, RPE Scale, weather conditions, NTG use, endpoints for exercise, warmup and cool down.  Patient is encouraged to come to me with any questions. I will continue to follow up with the patient to assist them with progression and safety.

## 2020-05-16 NOTE — Progress Notes (Signed)
Cardiac Individual Treatment Plan  Patient Details  Name: [name] MRN: [id number] Date of Birth: 06-23-1971 Referring Provider:     CARDIAC REHAB PHASE II ORIENTATION from 04/02/2020 in [facility]  Referring Provider Dr. Angelena Form       Initial Encounter Date:    CARDIAC REHAB PHASE II ORIENTATION from 04/02/2020 in [place]  Date 04/02/20      Visit Diagnosis: ST elevation myocardial infarction (STEMI), unspecified artery ([place])  Status post coronary artery stent placement  Patient's Home Medications on Admission:  Current Outpatient Medications:  .  acetaminophen (TYLENOL) 325 MG tablet, Take 325 mg by mouth every 6 (six) hours as needed., Disp: , Rfl:  .  amLODipine (NORVASC) 10 MG tablet, Take 1 tablet by mouth once daily, Disp: 90 tablet, Rfl: 0 .  aspirin 81 MG chewable tablet, Chew 1 tablet (81 mg total) by mouth daily., Disp: 90 tablet, Rfl: 1 .  atorvastatin (LIPITOR) 80 MG tablet, Take 1 tablet (80 mg total) by mouth daily., Disp: 90 tablet, Rfl: 3 .  cephALEXin (KEFLEX) 500 MG capsule, Take 1 capsule (500 mg total) by mouth 3 (three) times daily., Disp: 30 capsule, Rfl: 0 .  cloNIDine (CATAPRES) 0.3 MG tablet, TAKE ONE TABLET BY MOUTH AT BEDTIME, Disp: 90 tablet, Rfl: 3 .  labetalol (NORMODYNE) 200 MG tablet, TAKE ONE TABLET BY MOUTH ONCE DAILY EVERY MORNING, Disp: 90 tablet, Rfl: 3 .  lisinopril (ZESTRIL) 10 MG tablet, TAKE 1 TABLET BY MOUTH AT BEDTIME, Disp: 90 tablet, Rfl: 3 .  nitroGLYCERIN (NITROSTAT) 0.4 MG SL tablet, Place 1 tablet (0.4 mg total) under the tongue every 5 (five) minutes as needed., Disp: 25 tablet, Rfl: 3 .  ondansetron (ZOFRAN) 4 MG tablet, Take 1 tablet (4 mg total) by mouth every 8 (eight) hours as needed., Disp: 20 tablet, Rfl: 0 .  ticagrelor (BRILINTA) 90 MG TABS tablet, Take 1 tablet (90 mg total) by mouth 2 (two) times daily., Disp: 180 tablet, Rfl: 3 .  triamterene-hydrochlorothiazide  (MAXZIDE) 75-50 MG tablet, Take 1 tablet by mouth daily., Disp: 90 tablet, Rfl: 1  Past Medical History: Past Medical History:  Diagnosis Date  . CAD (coronary artery disease), native coronary artery    s/p DES to LAD 02/01/20  . Chest pain 01/2020  . Facial numbness   . Fibroid   . Fx ankle   . Hypertension   . Obesity     Tobacco Use: Social History   Tobacco Use  Smoking Status Never Smoker  Smokeless Tobacco Never Used    Labs: Recent Review Flowsheet Data    Labs for ITP Cardiac and Pulmonary Rehab Latest Ref Rng & Units 11/05/2017 08/16/2019 08/16/2019 02/02/2020 04/02/2020   Cholestrol 100 - 199 mg/dL 161 - - 162 128   LDLCALC 0 - 99 mg/dL 91 - - 92 54   HDL >39 mg/dL 52 - - 55 60   Trlycerides 0 - 149 mg/dL 89 - - 75 67   Hemoglobin A1c 4.8 - 5.6 % 5.0 - - 5.0 -   PHART 7.35 - 7.45 - 7.387 - - -   PCO2ART 32 - 48 mmHg - 40.1 - - -   HCO3 20.0 - 28.0 mmol/L - 24.5 - - -   TCO2 22 - 32 mmol/L - 26 24 - -   ACIDBASEDEF 0.0 - 2.0 mmol/L - 1.0 - - -   O2SAT % - 99.0 - - -  Capillary Blood Glucose: Lab Results  Component Value Date   GLUCAP 116 (H) 11/06/2009   GLUCAP 112 (H) 08/22/2009   GLUCAP 139 (H) 08/21/2009   GLUCAP 131 (H) 08/21/2009     Exercise Target Goals: Exercise Program Goal: Individual exercise prescription set using results from initial 6 min walk test and THRR while considering  patient's activity barriers and safety.   Exercise Prescription Goal: Starting with aerobic activity 30 plus minutes a day, 3 days per week for initial exercise prescription. Provide home exercise prescription and guidelines that participant acknowledges understanding prior to discharge.  Activity Barriers & Risk Stratification:  Activity Barriers & Cardiac Risk Stratification - 04/02/20 1012      Activity Barriers & Cardiac Risk Stratification   Activity Barriers Deconditioning    Cardiac Risk Stratification High           6 Minute Walk:  6 Minute Walk      Row Name 04/02/20 0956         6 Minute Walk   Phase Initial     Distance 1200 feet     Walk Time 6 minutes     # of Rest Breaks 0     MPH 2.27     METS 2.9     RPE 10     VO2 Peak 10.13     Symptoms No     Resting HR 87 bpm     Resting BP 110/72     Resting Oxygen Saturation  98 %     Exercise Oxygen Saturation  during 6 min walk 99 %     Max Ex. HR 100 bpm     Max Ex. BP 130/90     2 Minute Post BP 110/82            Oxygen Initial Assessment:   Oxygen Re-Evaluation:   Oxygen Discharge (Final Oxygen Re-Evaluation):   Initial Exercise Prescription:  Initial Exercise Prescription - 04/02/20 0900      Date of Initial Exercise RX and Referring Provider   Date 04/02/20    Referring Provider Dr. Angelena Form     Expected Discharge Date 07/03/20      Treadmill   MPH 1.5    Grade 0    Minutes 17    METs 2.14      T5 Nustep   Level 1    SPM 85    Minutes 22      Prescription Details   Frequency (times per week) 3    Duration Progress to 30 minutes of continuous aerobic without signs/symptoms of physical distress      Intensity   THRR 40-80% of Max Heartrate 69-138    Ratings of Perceived Exertion 11-13    Perceived Dyspnea 0-4      Resistance Training   Training Prescription Yes    Weight 2 lbs    Reps 10-15           Perform Capillary Blood Glucose checks as needed.  Exercise Prescription Changes:  Exercise Prescription Changes    Row Name 04/09/20 1100 04/20/20 1452 05/07/20 1200         Response to Exercise   Blood Pressure (Admit) 124/86 110/70 126/82     Blood Pressure (Exercise) 136/80 130/74 122/76     Blood Pressure (Exit) 124/86 100/70 116/80     Heart Rate (Admit) 107 bpm 99 bpm 88 bpm     Heart Rate (Exercise) 127 bpm 122 bpm 128 bpm  Heart Rate (Exit) 113 bpm 107 bpm 97 bpm     Rating of Perceived Exertion (Exercise) 12 11 11      Duration Continue with 30 min of aerobic exercise without signs/symptoms of physical distress.  Continue with 30 min of aerobic exercise without signs/symptoms of physical distress. Continue with 30 min of aerobic exercise without signs/symptoms of physical distress.     Intensity THRR unchanged THRR unchanged THRR unchanged       Progression   Progression Continue to progress workloads to maintain intensity without signs/symptoms of physical distress. Continue to progress workloads to maintain intensity without signs/symptoms of physical distress. Continue to progress workloads to maintain intensity without signs/symptoms of physical distress.       Resistance Training   Training Prescription Yes Yes Yes     Weight 2 lbs 2 lbs 3     Reps 10-15 10-15 10-15       Treadmill   MPH 1.5 2.1 2     Grade 0 0 0     Minutes 17 17 17      METs 2.14 2.6 2.53       T5 Nustep   Level 1 1 2      SPM 105 102 62     Minutes 22 22 22      METs 2.1 2.4 2.3            Exercise Comments:   Exercise Goals and Review:  Exercise Goals    Row Name 04/02/20 1002 05/14/20 1409           Exercise Goals   Increase Physical Activity Yes Yes      Intervention Provide advice, education, support and counseling about physical activity/exercise needs.;Develop an individualized exercise prescription for aerobic and resistive training based on initial evaluation findings, risk stratification, comorbidities and participant's personal goals. Provide advice, education, support and counseling about physical activity/exercise needs.;Develop an individualized exercise prescription for aerobic and resistive training based on initial evaluation findings, risk stratification, comorbidities and participant's personal goals.      Expected Outcomes Short Term: Attend rehab on a regular basis to increase amount of physical activity.;Long Term: Add in home exercise to make exercise part of routine and to increase amount of physical activity.;Long Term: Exercising regularly at least 3-5 days a week. Short Term: Attend rehab  on a regular basis to increase amount of physical activity.;Long Term: Add in home exercise to make exercise part of routine and to increase amount of physical activity.;Long Term: Exercising regularly at least 3-5 days a week.      Increase Strength and Stamina Yes Yes      Intervention Provide advice, education, support and counseling about physical activity/exercise needs.;Develop an individualized exercise prescription for aerobic and resistive training based on initial evaluation findings, risk stratification, comorbidities and participant's personal goals. Provide advice, education, support and counseling about physical activity/exercise needs.;Develop an individualized exercise prescription for aerobic and resistive training based on initial evaluation findings, risk stratification, comorbidities and participant's personal goals.      Expected Outcomes Short Term: Increase workloads from initial exercise prescription for resistance, speed, and METs.;Short Term: Perform resistance training exercises routinely during rehab and add in resistance training at home;Long Term: Improve cardiorespiratory fitness, muscular endurance and strength as measured by increased METs and functional capacity (6MWT) Short Term: Increase workloads from initial exercise prescription for resistance, speed, and METs.;Short Term: Perform resistance training exercises routinely during rehab and add in resistance training at home;Long Term: Improve cardiorespiratory fitness, muscular  endurance and strength as measured by increased METs and functional capacity (6MWT)      Able to understand and use rate of perceived exertion (RPE) scale Yes Yes      Intervention Provide education and explanation on how to use RPE scale Provide education and explanation on how to use RPE scale      Expected Outcomes Short Term: Able to use RPE daily in rehab to express subjective intensity level;Long Term:  Able to use RPE to guide intensity level  when exercising independently Short Term: Able to use RPE daily in rehab to express subjective intensity level;Long Term:  Able to use RPE to guide intensity level when exercising independently      Knowledge and understanding of Target Heart Rate Range (THRR) Yes Yes      Intervention Provide education and explanation of THRR including how the numbers were predicted and where they are located for reference Provide education and explanation of THRR including how the numbers were predicted and where they are located for reference      Expected Outcomes Short Term: Able to state/look up THRR;Long Term: Able to use THRR to govern intensity when exercising independently;Short Term: Able to use daily as guideline for intensity in rehab Short Term: Able to state/look up THRR;Long Term: Able to use THRR to govern intensity when exercising independently;Short Term: Able to use daily as guideline for intensity in rehab      Able to check pulse independently Yes Yes      Intervention Review the importance of being able to check your own pulse for safety during independent exercise;Provide education and demonstration on how to check pulse in carotid and radial arteries. Review the importance of being able to check your own pulse for safety during independent exercise;Provide education and demonstration on how to check pulse in carotid and radial arteries.      Expected Outcomes Short Term: Able to explain why pulse checking is important during independent exercise;Long Term: Able to check pulse independently and accurately Short Term: Able to explain why pulse checking is important during independent exercise;Long Term: Able to check pulse independently and accurately      Understanding of Exercise Prescription Yes Yes      Intervention Provide education, explanation, and written materials on patient's individual exercise prescription Provide education, explanation, and written materials on patient's individual  exercise prescription      Expected Outcomes Short Term: Able to explain program exercise prescription;Long Term: Able to explain home exercise prescription to exercise independently Short Term: Able to explain program exercise prescription;Long Term: Able to explain home exercise prescription to exercise independently             Exercise Goals Re-Evaluation :  Exercise Goals Re-Evaluation    Row Name 05/14/20 1409             Exercise Goal Re-Evaluation   Exercise Goals Review Increase Physical Activity;Increase Strength and Stamina;Able to understand and use rate of perceived exertion (RPE) scale;Knowledge and understanding of Target Heart Rate Range (THRR);Able to check pulse independently;Understanding of Exercise Prescription       Comments Pt has attended 9 exercise sessions. She will be having a minor surgery on her toe on 8/6 and it is unclear how much time she will miss due to this. She has been progressing well in the program. She currently exercises at 3.1 METs on the stepper. Will continue to monitor and progress as able.       Expected Outcomes  Pt will meet her goals and implement a home exercise program to accompany her exercise in cardiac rehab.               Discharge Exercise Prescription (Final Exercise Prescription Changes):  Exercise Prescription Changes - 05/07/20 1200      Response to Exercise   Blood Pressure (Admit) 126/82    Blood Pressure (Exercise) 122/76    Blood Pressure (Exit) 116/80    Heart Rate (Admit) 88 bpm    Heart Rate (Exercise) 128 bpm    Heart Rate (Exit) 97 bpm    Rating of Perceived Exertion (Exercise) 11    Duration Continue with 30 min of aerobic exercise without signs/symptoms of physical distress.    Intensity THRR unchanged      Progression   Progression Continue to progress workloads to maintain intensity without signs/symptoms of physical distress.      Resistance Training   Training Prescription Yes    Weight 3    Reps  10-15      Treadmill   MPH 2    Grade 0    Minutes 17    METs 2.53      T5 Nustep   Level 2    SPM 62    Minutes 22    METs 2.3           Nutrition:  Target Goals: Understanding of nutrition guidelines, daily intake of sodium 1500mg , cholesterol 200mg , calories 30% from fat and 7% or less from saturated fats, daily to have 5 or more servings of fruits and vegetables.  Biometrics:  Pre Biometrics - 04/02/20 1003      Pre Biometrics   Height 5\' 4"  (1.626 m)    Weight 123.8 kg    Waist Circumference 48 inches    Hip Circumference 54 inches    Waist to Hip Ratio 0.89 %    BMI (Calculated) 46.83    Triceps Skinfold 35 mm    % Body Fat 53.7 %    Grip Strength 40 kg    Flexibility 12 in    Single Leg Stand 12.37 seconds            Nutrition Therapy Plan and Nutrition Goals:  Nutrition Therapy & Goals - 05/14/20 1608      Personal Nutrition Goals   Comments Patient continues to drink 3 sodas/day and continues to work on reducing this number. She says she is trying to eat heart healtlhy. Will continue to monitor.      Intervention Plan   Intervention Nutrition handout(s) given to patient.           Nutrition Assessments:  Nutrition Assessments - 04/02/20 0948      MEDFICTS Scores   Pre Score 12           Nutrition Goals Re-Evaluation:   Nutrition Goals Discharge (Final Nutrition Goals Re-Evaluation):   Psychosocial: Target Goals: Acknowledge presence or absence of significant depression and/or stress, maximize coping skills, provide positive support system. Participant is able to verbalize types and ability to use techniques and skills needed for reducing stress and depression.  Initial Review & Psychosocial Screening:  Initial Psych Review & Screening - 04/02/20 1025      Initial Review   Current issues with None Identified      Family Dynamics   Good Support System? Yes      Barriers   Psychosocial barriers to participate in program There  are no identifiable barriers or psychosocial needs.  Screening Interventions   Interventions Encouraged to exercise;To provide support and resources with identified psychosocial needs;Provide feedback about the scores to participant    Expected Outcomes Short Term goal: Identification and review with participant of any Quality of Life or Depression concerns found by scoring the questionnaire.;Long Term goal: The participant improves quality of Life and PHQ9 Scores as seen by post scores and/or verbalization of changes           Quality of Life Scores:  Quality of Life - 04/02/20 0955      Quality of Life   Select Quality of Life      Quality of Life Scores   Health/Function Pre 18 %    Socioeconomic Pre 30 %    Psych/Spiritual Pre 29.14 %    Family Pre 30 %    GLOBAL Pre 24.69 %          Scores of 19 and below usually indicate a poorer quality of life in these areas.  A difference of  2-3 points is a clinically meaningful difference.  A difference of 2-3 points in the total score of the Quality of Life Index has been associated with significant improvement in overall quality of life, self-image, physical symptoms, and general health in studies assessing change in quality of life.  PHQ-9: Recent Review Flowsheet Data    Depression screen [facility] 2/9 04/02/2020 03/20/2020 05/12/2019 07/27/2018 01/05/2018   Decreased Interest 0 0 0 0 -   Down, Depressed, Hopeless 0 0 0 0 0   PHQ - 2 Score 0 0 0 0 0   Altered sleeping 0 - - - -   Tired, decreased energy 1 - - - -   Change in appetite 1 - - - -   Feeling bad or failure about yourself  0 - - - -   Trouble concentrating 0 - - - -   Moving slowly or fidgety/restless 0 - - - -   Suicidal thoughts 0 - - - -   PHQ-9 Score 2 - - - -   Difficult doing work/chores Somewhat difficult - - - -     Interpretation of Total Score  Total Score Depression Severity:  1-4 = Minimal depression, 5-9 = Mild depression, 10-14 = Moderate depression,  15-19 = Moderately severe depression, 20-27 = Severe depression   Psychosocial Evaluation and Intervention:  Psychosocial Evaluation - 04/02/20 1026      Psychosocial Evaluation & Interventions   Interventions Encouraged to exercise with the program and follow exercise prescription;Stress management education;Relaxation education    Comments Patient's initial QOL score was 24.96 and her PHQ_9 score was2. She has no psychosocial issues identified. She rated her stress level as low. 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 Will continue to montior.    Expected Outcomes Patient will have no psychosocial issues identifed at discharge.    Continue Psychosocial Services  No Follow up required           Psychosocial Re-Evaluation:  Psychosocial Re-Evaluation    [place] Name 04/12/20 1602 05/14/20 1609           Psychosocial Re-Evaluation   Current issues with None Identified None Identified      Comments Patient's initial QOL score was 24.69% and her PHQ-9 score was 2 with no psychosocial issues identified. Will continue to monitor. Patient continues to have no psychosocial issues identified. Will continue to monitor.  Expected Outcomes Patient will have no psychosocial issues identified at discharge. Patient will have no psychosocial issues identified at discharge.      Interventions Encouraged to attend Cardiac Rehabilitation for the exercise;Stress management education;Relaxation education Encouraged to attend Cardiac Rehabilitation for the exercise;Stress management education;Relaxation education      Continue Psychosocial Services  No Follow up required No Follow up required             Psychosocial Discharge (Final Psychosocial Re-Evaluation):  Psychosocial Re-Evaluation - 05/14/20 1609      Psychosocial Re-Evaluation   Current issues with None Identified    Comments Patient continues to have no psychosocial issues identified. Will  continue to monitor.    Expected Outcomes Patient will have no psychosocial issues identified at discharge.    Interventions Encouraged to attend Cardiac Rehabilitation for the exercise;Stress management education;Relaxation education    Continue Psychosocial Services  No Follow up required           Vocational Rehabilitation: Provide vocational rehab assistance to qualifying candidates.   Vocational Rehab Evaluation & Intervention:  Vocational Rehab - 04/02/20 0949      Initial Vocational Rehab Evaluation & Intervention   Assessment shows need for Vocational Rehabilitation No      Vocational Rehab Re-Evaulation   Comments Patient has returned to work working full-time.           Education: Education Goals: Education classes will be provided on a weekly basis, covering required topics. Participant will state understanding/return demonstration of topics presented.  Learning Barriers/Preferences:  Learning Barriers/Preferences - 04/02/20 0948      Learning Barriers/Preferences   Learning Barriers None    Learning Preferences Skilled Demonstration;Written Material;Audio;Group Instruction           Education Topics: Hypertension, Hypertension Reduction -Define heart disease and high blood pressure. Discus how high blood pressure affects the body and ways to reduce high blood pressure.   CARDIAC REHAB PHASE II EXERCISE from 05/09/2020 in [place]  Date 04/18/20  Educator DF  Instruction Review Code 2- Demonstrated Understanding      Exercise and Your Heart -Discuss why it is important to exercise, the FITT principles of exercise, normal and abnormal responses to exercise, and how to exercise safely.   CARDIAC REHAB PHASE II EXERCISE from 05/09/2020 in [place]  Date 04/25/20  Educator Etheleen Mayhew  Instruction Review Code 1- Verbalizes Understanding      Angina -Discuss definition of angina, causes of angina,  treatment of angina, and how to decrease risk of having angina.   Cardiac Medications -Review what the following cardiac medications are used for, how they affect the body, and side effects that may occur when taking the medications.  Medications include Aspirin, Beta blockers, calcium channel blockers, ACE Inhibitors, angiotensin receptor blockers, diuretics, digoxin, and antihyperlipidemics.   CARDIAC REHAB PHASE II EXERCISE from 05/09/2020 in [place]  Date 05/09/20  Educator DF  Instruction Review Code 1- Verbalizes Understanding      Congestive Heart Failure -Discuss the definition of CHF, how to live with CHF, the signs and symptoms of CHF, and how keep track of weight and sodium intake.   Heart Disease and Intimacy -Discus the effect sexual activity has on the heart, how changes occur during intimacy as we age, and safety during sexual activity.   Smoking Cessation / COPD -Discuss different methods to quit smoking, the health benefits of quitting smoking, and the definition of COPD.  Nutrition I: Fats -Discuss the types of cholesterol, what cholesterol does to the heart, and how cholesterol levels can be controlled.   Nutrition II: Labels -Discuss the different components of food labels and how to read food label   Heart Parts/Heart Disease and PAD -Discuss the anatomy of the heart, the pathway of blood circulation through the heart, and these are affected by heart disease.   Stress I: Signs and Symptoms -Discuss the causes of stress, how stress may lead to anxiety and depression, and ways to limit stress.   Stress II: Relaxation -Discuss different types of relaxation techniques to limit stress.   Warning Signs of Stroke / TIA -Discuss definition of a stroke, what the signs and symptoms are of a stroke, and how to identify when someone is having stroke.   CARDIAC REHAB PHASE II EXERCISE from 05/09/2020 in [place]    Date 04/11/20  Educator Etheleen Mayhew  Instruction Review Code 1- Verbalizes Understanding      Knowledge Questionnaire Score:  Knowledge Questionnaire Score - 04/02/20 0949      Knowledge Questionnaire Score   Pre Score 9/24           Core Components/Risk Factors/Patient Goals at Admission:  Personal Goals and Risk Factors at Admission - 04/02/20 0949      Core Components/Risk Factors/Patient Goals on Admission    Weight Management Yes    Intervention Weight Management: Develop a combined nutrition and exercise program designed to reach desired caloric intake, while maintaining appropriate intake of nutrient and fiber, sodium and fats, and appropriate energy expenditure required for the weight goal.;Weight Management: Provide education and appropriate resources to help participant work on and attain dietary goals.;Weight Management/Obesity: Establish reasonable short term and long term weight goals.;Obesity: Provide education and appropriate resources to help participant work on and attain dietary goals.    Admit Weight 273 lb (123.8 kg)    Goal Weight: Short Term 268 lb (121.6 kg)    Goal Weight: Long Term 263 lb (119.3 kg)    Expected Outcomes Long Term: Adherence to nutrition and physical activity/exercise program aimed toward attainment of established weight goal;Weight Loss: Understanding of general recommendations for a balanced deficit meal plan, which promotes 1-2 lb weight loss per week and includes a negative energy balance of [phone] kcal/d    Personal Goal Other Yes    Personal Goal Lose 10 lbs in program. Learn how to eat healthier and develop an healthy lifestyle overall.    Intervention Patient will participate in CR 3 days/week and supplement with exercise 2 days/week.    Expected Outcomes Patient will meet both program and personal goals.           Core Components/Risk Factors/Patient Goals Review:   Goals and Risk Factor Review    Row Name 04/12/20 1603  05/14/20 1609           Core Components/Risk Factors/Patient Goals Review   Personal Goals Review Weight Management/Obesity;Other  Lose 10 lbs; Improve eating habits; live a healthier life. Weight Management/Obesity;Other  Lose 10 lbs; improve eating habits; live a healthier life.      Review Patient is new to the program completing 3 sessions. She is looking forward to meeting her personal goals. Will continue to monitor for progress. Patient has completed 10 sessions gaining 2 lbs since her last 30 day review. She is doing well in the program with some progression. She is scheduled to have surgery on her 4th right toe to remove  a ganglion tumor. She is not sure how long this will take her out of the program. She will resume activities as advised by her surgeon. Will continue to monitor.      Expected Outcomes Patient will continue to attend sessions and complete the program meeting both program and personal goals. Patient will continue to attend sessions and complete the program meeting both program and personal goals.             Core Components/Risk Factors/Patient Goals at Discharge (Final Review):   Goals and Risk Factor Review - 05/14/20 1609      Core Components/Risk Factors/Patient Goals Review   Personal Goals Review Weight Management/Obesity;Other   Lose 10 lbs; improve eating habits; live a healthier life.   Review Patient has completed 10 sessions gaining 2 lbs since her last 30 day review. She is doing well in the program with some progression. 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 She is not sure how long this will take her out of the program. She will resume activities as advised by her surgeon. Will continue to monitor.    Expected Outcomes Patient will continue to attend sessions and complete the program meeting both program and personal goals.           ITP Comments:   Comments: ITP REVIEW Pt is making expected progress  toward Cardiac Rehab goals after completing 10 sessions. Recommend continued exercise, life style modification, education, and increased stamina and strength.

## 2020-05-16 NOTE — Progress Notes (Signed)
Daily Session Note  Patient Details  Name: [name] MRN: [id number] Date of Birth: Aug 07, 1971 Referring Provider:     CARDIAC REHAB PHASE II ORIENTATION from 04/02/2020 in [place]  Referring Provider Dr. Angelena Form       Encounter Date: 05/16/2020  Check In:  Session Check In - 05/16/20 0821      Check-In   Supervising physician immediately available to respond to emergencies See telemetry face sheet for immediately available MD    Location [facility]    Staff Present Algis Downs, Exercise Physiologist;Belinda Bringhurst Kris Mouton, MS, ACSM-CEP, Exercise Physiologist;Carlette Wilber Oliphant, RN, BSN    Virtual Visit No    Medication changes reported     No    Fall or balance concerns reported    No    Tobacco Cessation No Change    Warm-up and Cool-down Performed as group-led instruction    Resistance Training Performed Yes    VAD Patient? No    PAD/SET Patient? No      Pain Assessment   Currently in Pain? No/denies    Pain Score 0-No pain    Multiple Pain Sites No           Capillary Blood Glucose: No results found for this or any previous visit (from the past 24 hour(s)).    Social History   Tobacco Use  Smoking Status Never Smoker  Smokeless Tobacco Never Used    Goals Met:  Independence with exercise equipment Exercise tolerated well No report of cardiac concerns or symptoms Strength training completed today  Goals Unmet:  Not Applicable  Comments: checkout time is 0915   Dr. Kathie Dike is Medical Director for [facility].

## 2020-05-17 ENCOUNTER — Encounter: Payer: BC Managed Care – PPO | Admitting: Podiatry

## 2020-05-18 ENCOUNTER — Encounter (HOSPITAL_COMMUNITY): Payer: BC Managed Care – PPO

## 2020-05-18 DIAGNOSIS — M67871 Other specified disorders of synovium, right ankle and foot: Secondary | ICD-10-CM | POA: Diagnosis not present

## 2020-05-18 DIAGNOSIS — M67471 Ganglion, right ankle and foot: Secondary | ICD-10-CM | POA: Diagnosis not present

## 2020-05-18 DIAGNOSIS — M79674 Pain in right toe(s): Secondary | ICD-10-CM | POA: Diagnosis not present

## 2020-05-18 DIAGNOSIS — I10 Essential (primary) hypertension: Secondary | ICD-10-CM | POA: Diagnosis not present

## 2020-05-18 DIAGNOSIS — D3613 Benign neoplasm of peripheral nerves and autonomic nervous system of lower limb, including hip: Secondary | ICD-10-CM | POA: Diagnosis not present

## 2020-05-21 ENCOUNTER — Encounter (HOSPITAL_COMMUNITY): Payer: BC Managed Care – PPO

## 2020-05-23 ENCOUNTER — Encounter (HOSPITAL_COMMUNITY): Payer: BC Managed Care – PPO

## 2020-05-24 ENCOUNTER — Encounter: Payer: Self-pay | Admitting: Podiatry

## 2020-05-24 ENCOUNTER — Other Ambulatory Visit: Payer: Self-pay

## 2020-05-24 ENCOUNTER — Ambulatory Visit (INDEPENDENT_AMBULATORY_CARE_PROVIDER_SITE_OTHER): Payer: BC Managed Care – PPO | Admitting: Podiatry

## 2020-05-24 ENCOUNTER — Encounter: Payer: BC Managed Care – PPO | Admitting: Podiatry

## 2020-05-24 DIAGNOSIS — Z9889 Other specified postprocedural states: Secondary | ICD-10-CM

## 2020-05-24 DIAGNOSIS — M67471 Ganglion, right ankle and foot: Secondary | ICD-10-CM

## 2020-05-24 NOTE — Progress Notes (Signed)
She presents today for follow-up of excision cyst fourth toe right foot overall he feels pretty good she is remove the dressing earlier last week when it got wet due to moving her daughter into college.  She states has been no drainage and no pain.  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  No signs of infection.  Assessment: Well-healing surgical toe.  Plan: Demonstrated to her how to wrap the toe on a daily basis and allow her to start showering with this and I will follow-up with her in 1 week for suture removal.  I will then follow-up with her 2 weeks after the

## 2020-05-25 ENCOUNTER — Encounter (HOSPITAL_COMMUNITY): Payer: BC Managed Care – PPO

## 2020-05-28 ENCOUNTER — Encounter (HOSPITAL_COMMUNITY): Payer: BC Managed Care – PPO

## 2020-05-30 ENCOUNTER — Encounter (HOSPITAL_COMMUNITY): Payer: BC Managed Care – PPO

## 2020-06-01 ENCOUNTER — Encounter (HOSPITAL_COMMUNITY): Payer: BC Managed Care – PPO

## 2020-06-01 ENCOUNTER — Other Ambulatory Visit: Payer: Self-pay

## 2020-06-01 ENCOUNTER — Ambulatory Visit (INDEPENDENT_AMBULATORY_CARE_PROVIDER_SITE_OTHER): Payer: BC Managed Care – PPO | Admitting: Podiatry

## 2020-06-01 DIAGNOSIS — M67471 Ganglion, right ankle and foot: Secondary | ICD-10-CM

## 2020-06-01 DIAGNOSIS — Z9889 Other specified postprocedural states: Secondary | ICD-10-CM

## 2020-06-04 ENCOUNTER — Encounter (HOSPITAL_COMMUNITY): Payer: BC Managed Care – PPO

## 2020-06-05 ENCOUNTER — Encounter: Payer: Self-pay | Admitting: Podiatry

## 2020-06-05 NOTE — Progress Notes (Signed)
Subjective:  Patient ID: [name], female    DOB: Jan 16, 1971,  MRN: [id number]  Chief Complaint  Patient presents with  . Routine Post Op    POV #2 DOS 05/18/2020 EXCISION CYST 4TH TOE RIGHT/DR HYATT PT      49 y.o. female returns for post-op check.  Healing well.  She has been keeping the bandages clean dry and intact.  No acute complaints.  She does not have any pain.  Review of Systems: Negative except as noted in the HPI. Denies N/V/F/Ch.  Past Medical History:  Diagnosis Date  . CAD (coronary artery disease), native coronary artery    s/p DES to LAD 02/01/20  . Chest pain 01/2020  . Facial numbness   . Fibroid   . Fx ankle   . Hypertension   . Obesity     Current Outpatient Medications:  .  acetaminophen (TYLENOL) 325 MG tablet, Take 325 mg by mouth every 6 (six) hours as needed., Disp: , Rfl:  .  amLODipine (NORVASC) 10 MG tablet, Take 1 tablet by mouth once daily, Disp: 90 tablet, Rfl: 0 .  aspirin 81 MG chewable tablet, Chew 1 tablet (81 mg total) by mouth daily., Disp: 90 tablet, Rfl: 1 .  atorvastatin (LIPITOR) 80 MG tablet, Take 1 tablet (80 mg total) by mouth daily., Disp: 90 tablet, Rfl: 3 .  cephALEXin (KEFLEX) 500 MG capsule, Take 1 capsule (500 mg total) by mouth 3 (three) times daily., Disp: 30 capsule, Rfl: 0 .  cloNIDine (CATAPRES) 0.3 MG tablet, TAKE ONE TABLET BY MOUTH AT BEDTIME, Disp: 90 tablet, Rfl: 3 .  labetalol (NORMODYNE) 200 MG tablet, TAKE ONE TABLET BY MOUTH ONCE DAILY EVERY MORNING, Disp: 90 tablet, Rfl: 3 .  lisinopril (ZESTRIL) 10 MG tablet, TAKE 1 TABLET BY MOUTH AT BEDTIME, Disp: 90 tablet, Rfl: 3 .  nitroGLYCERIN (NITROSTAT) 0.4 MG SL tablet, Place 1 tablet (0.4 mg total) under the tongue every 5 (five) minutes as needed., Disp: 25 tablet, Rfl: 3 .  ondansetron (ZOFRAN) 4 MG tablet, Take 1 tablet (4 mg total) by mouth every 8 (eight) hours as needed., Disp: 20 tablet, Rfl: 0 .  ticagrelor (BRILINTA) 90 MG TABS tablet, Take 1 tablet (90 mg  total) by mouth 2 (two) times daily., Disp: 180 tablet, Rfl: 3 .  triamterene-hydrochlorothiazide (MAXZIDE) 75-50 MG tablet, Take 1 tablet by mouth daily., Disp: 90 tablet, Rfl: 1  Social History   Tobacco Use  Smoking Status Never Smoker  Smokeless Tobacco Never Used    No Known Allergies Objective:  There were no vitals filed for this visit. There is no height or weight on file to calculate BMI. Constitutional Well developed. Well nourished.  Vascular Foot warm and well perfused. Capillary refill normal to all digits.   Neurologic Normal speech. Oriented to person, place, and time. Epicritic sensation to light touch grossly present bilaterally.  Dermatologic Skin healing well without signs of infection. Skin edges well coapted without signs of infection.  Orthopedic: Tenderness to palpation noted about the surgical site.   Radiographs: None Assessment:   1. Ganglion cyst of right foot   2. S/P foot surgery, left    Plan:  Patient was evaluated and treated and all questions answered.  S/p foot surgery right -Progressing as expected post-operatively. -XR: None -WB Status: Weightbearing as tolerated in regular sneakers -Sutures: None.  No dehiscence noted.  No complication noted. -Medications: None -Patient was officially discharged from my care.  Patient states the surgery went  well.  She states that she does not need to see Dr. Milinda Pointer for final clearance.  I have asked her if any foot and ankle issues arises to come see Dr. Milinda Pointer or myself.  Patient states understanding  No follow-ups on file.

## 2020-06-06 ENCOUNTER — Encounter (HOSPITAL_COMMUNITY): Payer: BC Managed Care – PPO

## 2020-06-07 ENCOUNTER — Encounter: Payer: BC Managed Care – PPO | Admitting: Podiatry

## 2020-06-08 ENCOUNTER — Encounter (HOSPITAL_COMMUNITY): Payer: BC Managed Care – PPO

## 2020-06-08 ENCOUNTER — Ambulatory Visit: Payer: BC Managed Care – PPO | Admitting: Cardiovascular Disease

## 2020-06-08 NOTE — Progress Notes (Deleted)
No chief complaint on file.   History of Present Illness: 49 yo female with history of HTN, CAD here today for cardiac follow up. She was admitted to [facility] April 2021 with an anterior STEMI. Her mid LAD had a severe stenosis treated with a drug eluting stent. No obstructive disease in the RCA or Circumflex. LV systolic function was normal.     She is here today for follow up. *** ? Surgery***The patient denies any chest pain, dyspnea, palpitations, lower extremity edema, orthopnea, PND, dizziness, near syncope or syncope.   Primary Care Physician: Fayrene Helper, MD   Past Medical History:  Diagnosis Date  . CAD (coronary artery disease), native coronary artery    s/p DES to LAD 02/01/20  . Chest pain 01/2020  . Facial numbness   . Fibroid   . Fx ankle   . Hypertension   . Obesity     Past Surgical History:  Procedure Laterality Date  . APPENDECTOMY    . APPLICATION OF CRANIAL NAVIGATION N/A 08/16/2019   Procedure: APPLICATION OF CRANIAL NAVIGATION;  Surgeon: Judith Part, MD;  Location: [place];  Service: Neurosurgery;  Laterality: N/A;  . bilateral breast reduction  2004  . CHOLECYSTECTOMY    . CORONARY STENT INTERVENTION N/A 02/01/2020   Procedure: CORONARY STENT INTERVENTION;  Surgeon: Burnell Blanks, MD;  Location: [place] [facility];  Service: Cardiovascular;  Laterality: N/A;  . CRANIOTOMY Left 08/16/2019   Procedure: Left craniotomy for tumor resection;  Surgeon: Judith Part, MD;  Location: [place];  Service: Neurosurgery;  Laterality: Left;  Left craniotomy for tumor resection  . INTRAVASCULAR PRESSURE WIRE/FFR STUDY N/A 02/01/2020   Procedure: INTRAVASCULAR PRESSURE WIRE/FFR STUDY;  Surgeon: Burnell Blanks, MD;  Location: [place] [facility];  Service: Cardiovascular;  Laterality: N/A;  . LEFT HEART CATH AND CORONARY ANGIOGRAPHY N/A 02/01/2020   Procedure: LEFT HEART CATH AND CORONARY ANGIOGRAPHY;  Surgeon: Burnell Blanks, MD;   Location: [place] [facility];  Service: Cardiovascular;  Laterality: N/A;  . REDUCTION MAMMAPLASTY Bilateral   . removal of left ovarian cyst  2003  . TUBAL LIGATION  2011   Women's, removal of tubes 2013    Current Outpatient Medications  Medication Sig Dispense Refill  . acetaminophen (TYLENOL) 325 MG tablet Take 325 mg by mouth every 6 (six) hours as needed.    Marland Kitchen amLODipine (NORVASC) 10 MG tablet Take 1 tablet by mouth once daily 90 tablet 0  . aspirin 81 MG chewable tablet Chew 1 tablet (81 mg total) by mouth daily. 90 tablet 1  . atorvastatin (LIPITOR) 80 MG tablet Take 1 tablet (80 mg total) by mouth daily. 90 tablet 3  . cephALEXin (KEFLEX) 500 MG capsule Take 1 capsule (500 mg total) by mouth 3 (three) times daily. 30 capsule 0  . cloNIDine (CATAPRES) 0.3 MG tablet TAKE ONE TABLET BY MOUTH AT BEDTIME 90 tablet 3  . labetalol (NORMODYNE) 200 MG tablet TAKE ONE TABLET BY MOUTH ONCE DAILY EVERY MORNING 90 tablet 3  . lisinopril (ZESTRIL) 10 MG tablet TAKE 1 TABLET BY MOUTH AT BEDTIME 90 tablet 3  . nitroGLYCERIN (NITROSTAT) 0.4 MG SL tablet Place 1 tablet (0.4 mg total) under the tongue every 5 (five) minutes as needed. 25 tablet 3  . ondansetron (ZOFRAN) 4 MG tablet Take 1 tablet (4 mg total) by mouth every 8 (eight) hours as needed. 20 tablet 0  . ticagrelor (BRILINTA) 90 MG TABS tablet Take 1 tablet (  90 mg total) by mouth 2 (two) times daily. 180 tablet 3  . triamterene-hydrochlorothiazide (MAXZIDE) 75-50 MG tablet Take 1 tablet by mouth daily. 90 tablet 1   No current facility-administered medications for this visit.    No Known Allergies  Social History   Socioeconomic History  . Marital status: Single    Spouse name: Not on file  . Number of children: 4  . Years of education: Not on file  . Highest education level: Associate degree: occupational, Hotel manager, or vocational program  Occupational History  . Not on file  Tobacco Use  . Smoking status: Never Smoker  .  Smokeless tobacco: Never Used  Vaping Use  . Vaping Use: Never used  Substance and Sexual Activity  . Alcohol use: Yes    Comment: rarely  . Drug use: No  . Sexual activity: Yes    Birth control/protection: Surgical  Other Topics Concern  . Not on file  Social History Narrative   Lives with children   Caffeine- sodas, 48 oz daily   Social Determinants of Health   Financial Resource Strain:   . Difficulty of Paying Living Expenses: Not on file  Food Insecurity:   . Worried About Charity fundraiser in the Last Year: Not on file  . Ran Out of Food in the Last Year: Not on file  Transportation Needs:   . Lack of Transportation (Medical): Not on file  . Lack of Transportation (Non-Medical): Not on file  Physical Activity:   . Days of Exercise per Week: Not on file  . Minutes of Exercise per Session: Not on file  Stress:   . Feeling of Stress : Not on file  Social Connections:   . Frequency of Communication with Friends and Family: Not on file  . Frequency of Social Gatherings with Friends and Family: Not on file  . Attends Religious Services: Not on file  . Active Member of Clubs or Organizations: Not on file  . Attends Archivist Meetings: Not on file  . Marital Status: Not on file  Intimate Partner Violence:   . Fear of Current or Ex-Partner: Not on file  . Emotionally Abused: Not on file  . Physically Abused: Not on file  . Sexually Abused: Not on file    Family History  Problem Relation Age of Onset  . Coronary artery disease Mother   . Hypertension Mother   . Coronary artery disease Father   . Kidney disease Father   . Hypertension Father   . Multiple sclerosis Sister   . Hypertension Sister   . Stroke Maternal Grandmother   . Heart attack Maternal Grandfather   . Anesthesia problems Neg Hx   . Hypotension Neg Hx   . Malignant hyperthermia Neg Hx   . Pseudochol deficiency Neg Hx     Review of Systems:  As stated in the HPI and otherwise  negative.   There were no vitals taken for this visit.  Physical Examination: General: Well developed, well nourished, NAD  HEENT: OP clear, mucus membranes moist  SKIN: warm, dry. No rashes. Neuro: No focal deficits  Musculoskeletal: Muscle strength 5/5 all ext  Psychiatric: Mood and affect normal  Neck: No JVD, no carotid bruits, no thyromegaly, no lymphadenopathy.  Lungs:Clear bilaterally, no wheezes, rhonci, crackles Cardiovascular: Regular rate and rhythm. No murmurs, gallops or rubs. Abdomen:Soft. Bowel sounds present. Non-tender.  Extremities: No lower extremity edema. Pulses are 2 + in the bilateral DP/PT.  EKG:  EKG {ACTION;  IS/IS QZR:[id number]} ordered today. The ekg ordered today demonstrates ***  Echocardiogram: 02/01/20 1. Left ventricular ejection fraction, by estimation, is 55 to 60%. The  left ventricle has normal function. The left ventricle has no regional  wall motion abnormalities. There is moderate left ventricular hypertrophy.  Left ventricular diastolic  parameters are consistent with Grade I diastolic dysfunction (impaired  relaxation).  2. Right ventricular systolic function is normal. The right ventricular  size is normal. Tricuspid regurgitation signal is inadequate for assessing  PA pressure.  3. The mitral valve is grossly normal. No evidence of mitral valve  regurgitation. No evidence of mitral stenosis.  4. The aortic valve is tricuspid. Aortic valve regurgitation is not  visualized. No aortic stenosis is present.  5. The inferior vena cava is normal in size with greater than 50%  respiratory variability, suggesting right atrial pressure of 3 mmHg.   CORONARY STENT INTERVENTION  02/01/20  INTRAVASCULAR PRESSURE WIRE/FFR STUDY  LEFT HEART CATH AND CORONARY ANGIOGRAPHY  Conclusion    Mid LAD lesion is 80% stenosed.  A drug-eluting stent was successfully placed using a STENT RESOLUTE ONYX 3.5X18.  Post intervention, there is a 0%  residual stenosis.  The left ventricular systolic function is normal.  LV end diastolic pressure is normal.  The left ventricular ejection fraction is 55-65% by visual estimate.  There is no mitral valve regurgitation.  1. Severe stenosis mid LAD. Flow limiting by DFR 0.86.  2. Successful PTCA/DES x 1 mid LAD 3. No obstructive disease in the RCA or Circumflex 4. Normal LV systolic function  Recommendations: Continue DAPT for one year with ASA and Brilinta. Will start high intensity statin. Continue beta blocker. Echo later today.    Diagnostic Dominance: Right  Intervention      Recent Labs: 02/02/2020: BUN 11; Creatinine, Ser 0.64; Hemoglobin 11.9; Platelets 233; Potassium 3.7; Sodium 133 04/02/2020: ALT 16   Lipid Panel    Component Value Date/Time   CHOL 128 04/02/2020 1025   TRIG 67 04/02/2020 1025   HDL 60 04/02/2020 1025   CHOLHDL 2.1 04/02/2020 1025   CHOLHDL 2.9 02/02/2020 0830   VLDL 15 02/02/2020 0830   LDLCALC 54 04/02/2020 1025   LDLCALC 91 11/05/2017 0906     Wt Readings from Last 3 Encounters:  05/16/20 277 lb 12.5 oz (126 kg)  05/07/20 (!) 277 lb 12.5 oz (126 kg)  04/20/20 272 lb 4.3 oz (123.5 kg)     Other studies Reviewed: Additional studies/ records that were reviewed today include: ***. Review of the above records demonstrates: ***   Assessment and Plan:   1. CAD without angina: No chest pain. Continue ASA, Brilinta until April 2022. Continue statin and beta blocker  2. HTN: BP is controlled. Continue current therapy  3. Hyperlipidemia: LDL at goal. Continue statin.   Current medicines are reviewed at length with the patient today.  The patient {ACTIONS; HAS/DOES NOT HAVE:19233} concerns regarding medicines.  The following changes have been made:  {PLAN; NO CHANGE:13088:s}  Labs/ tests ordered today include: *** No orders of the defined types were placed in this encounter.    Disposition:   FU with *** in {gen number  [id number]} {TIME; UNITS DAY/WEEK/MONTH:19136}   Signed, [name], MD 06/08/2020 6:34 AM    [facility] [place], [place], [place]  [address] Phone: [phone]; Fax: [phone]

## 2020-06-11 ENCOUNTER — Encounter (HOSPITAL_COMMUNITY)
Admission: RE | Admit: 2020-06-11 | Discharge: 2020-06-11 | Disposition: A | Discharge status: Home / Self Care | Payer: BC Managed Care – PPO | Source: Ambulatory Visit | Attending: Cardiovascular Disease | Admitting: Cardiovascular Disease

## 2020-06-11 ENCOUNTER — Other Ambulatory Visit: Payer: Self-pay

## 2020-06-11 DIAGNOSIS — Z955 Presence of coronary angioplasty implant and graft: Secondary | ICD-10-CM

## 2020-06-11 DIAGNOSIS — I213 ST elevation (STEMI) myocardial infarction of unspecified site: Secondary | ICD-10-CM

## 2020-06-11 NOTE — Progress Notes (Signed)
Daily Session Note  Patient Details  Name: RIYAH BARDON MRN: [id number] Date of Birth: 09/14/1971 Referring Provider:     CARDIAC REHAB PHASE II ORIENTATION from 04/02/2020 in [place]  Referring Provider Dr. Angelena Form       Encounter Date: 06/11/2020  Check In:  Session Check In - 06/11/20 0817      Check-In   Supervising physician immediately available to respond to emergencies See telemetry face sheet for immediately available MD    Location [facility]    Staff Present Hoy Register, MS, ACSM-CEP, Exercise Physiologist;Debra Wynetta Emery, RN, BSN    Virtual Visit No    Medication changes reported     No    Fall or balance concerns reported    No    Tobacco Cessation No Change    Warm-up and Cool-down Performed as group-led instruction    Resistance Training Performed Yes    VAD Patient? No    PAD/SET Patient? No      Pain Assessment   Currently in Pain? No/denies    Pain Score 0-No pain    Multiple Pain Sites No           Capillary Blood Glucose: No results found for this or any previous visit (from the past 24 hour(s)).    Social History   Tobacco Use  Smoking Status Never Smoker  Smokeless Tobacco Never Used    Goals Met:  Independence with exercise equipment Exercise tolerated well No report of cardiac concerns or symptoms Strength training completed today  Goals Unmet:  Not Applicable  Comments: checkout time is 0915   Dr. Kathie Dike is Medical Director for [facility].

## 2020-06-13 ENCOUNTER — Other Ambulatory Visit: Payer: Self-pay

## 2020-06-13 ENCOUNTER — Encounter (HOSPITAL_COMMUNITY)
Admission: RE | Admit: 2020-06-13 | Discharge: 2020-06-13 | Disposition: A | Discharge status: Home / Self Care | Payer: BC Managed Care – PPO | Source: Ambulatory Visit | Attending: Cardiovascular Disease | Admitting: Cardiovascular Disease

## 2020-06-13 DIAGNOSIS — Z955 Presence of coronary angioplasty implant and graft: Secondary | ICD-10-CM | POA: Insufficient documentation

## 2020-06-13 DIAGNOSIS — I213 ST elevation (STEMI) myocardial infarction of unspecified site: Secondary | ICD-10-CM | POA: Insufficient documentation

## 2020-06-13 NOTE — Progress Notes (Signed)
Cardiac Individual Treatment Plan  Patient Details  Name: HIBA GARRY MRN: [id number] Date of Birth: 06-23-1971 Referring Provider:     CARDIAC REHAB PHASE II ORIENTATION from 04/02/2020 in [facility]  Referring Provider Dr. Angelena Form       Initial Encounter Date:    CARDIAC REHAB PHASE II ORIENTATION from 04/02/2020 in [place]  Date 04/02/20      Visit Diagnosis: ST elevation myocardial infarction (STEMI), unspecified artery ([place])  Status post coronary artery stent placement  Patient's Home Medications on Admission:  Current Outpatient Medications:  .  acetaminophen (TYLENOL) 325 MG tablet, Take 325 mg by mouth every 6 (six) hours as needed., Disp: , Rfl:  .  amLODipine (NORVASC) 10 MG tablet, Take 1 tablet by mouth once daily, Disp: 90 tablet, Rfl: 0 .  aspirin 81 MG chewable tablet, Chew 1 tablet (81 mg total) by mouth daily., Disp: 90 tablet, Rfl: 1 .  atorvastatin (LIPITOR) 80 MG tablet, Take 1 tablet (80 mg total) by mouth daily., Disp: 90 tablet, Rfl: 3 .  cephALEXin (KEFLEX) 500 MG capsule, Take 1 capsule (500 mg total) by mouth 3 (three) times daily., Disp: 30 capsule, Rfl: 0 .  cloNIDine (CATAPRES) 0.3 MG tablet, TAKE ONE TABLET BY MOUTH AT BEDTIME, Disp: 90 tablet, Rfl: 3 .  labetalol (NORMODYNE) 200 MG tablet, TAKE ONE TABLET BY MOUTH ONCE DAILY EVERY MORNING, Disp: 90 tablet, Rfl: 3 .  lisinopril (ZESTRIL) 10 MG tablet, TAKE 1 TABLET BY MOUTH AT BEDTIME, Disp: 90 tablet, Rfl: 3 .  nitroGLYCERIN (NITROSTAT) 0.4 MG SL tablet, Place 1 tablet (0.4 mg total) under the tongue every 5 (five) minutes as needed., Disp: 25 tablet, Rfl: 3 .  ondansetron (ZOFRAN) 4 MG tablet, Take 1 tablet (4 mg total) by mouth every 8 (eight) hours as needed., Disp: 20 tablet, Rfl: 0 .  ticagrelor (BRILINTA) 90 MG TABS tablet, Take 1 tablet (90 mg total) by mouth 2 (two) times daily., Disp: 180 tablet, Rfl: 3 .  triamterene-hydrochlorothiazide  (MAXZIDE) 75-50 MG tablet, Take 1 tablet by mouth daily., Disp: 90 tablet, Rfl: 1  Past Medical History: Past Medical History:  Diagnosis Date  . CAD (coronary artery disease), native coronary artery    s/p DES to LAD 02/01/20  . Chest pain 01/2020  . Facial numbness   . Fibroid   . Fx ankle   . Hypertension   . Obesity     Tobacco Use: Social History   Tobacco Use  Smoking Status Never Smoker  Smokeless Tobacco Never Used    Labs: Recent Review Flowsheet Data    Labs for ITP Cardiac and Pulmonary Rehab Latest Ref Rng & Units 11/05/2017 08/16/2019 08/16/2019 02/02/2020 04/02/2020   Cholestrol 100 - 199 mg/dL 161 - - 162 128   LDLCALC 0 - 99 mg/dL 91 - - 92 54   HDL >39 mg/dL 52 - - 55 60   Trlycerides 0 - 149 mg/dL 89 - - 75 67   Hemoglobin A1c 4.8 - 5.6 % 5.0 - - 5.0 -   PHART 7.35 - 7.45 - 7.387 - - -   PCO2ART 32 - 48 mmHg - 40.1 - - -   HCO3 20.0 - 28.0 mmol/L - 24.5 - - -   TCO2 22 - 32 mmol/L - 26 24 - -   ACIDBASEDEF 0.0 - 2.0 mmol/L - 1.0 - - -   O2SAT % - 99.0 - - -  Capillary Blood Glucose: Lab Results  Component Value Date   GLUCAP 116 (H) 11/06/2009   GLUCAP 112 (H) 08/22/2009   GLUCAP 139 (H) 08/21/2009   GLUCAP 131 (H) 08/21/2009     Exercise Target Goals: Exercise Program Goal: Individual exercise prescription set using results from initial 6 min walk test and THRR while considering  patient's activity barriers and safety.   Exercise Prescription Goal: Starting with aerobic activity 30 plus minutes a day, 3 days per week for initial exercise prescription. Provide home exercise prescription and guidelines that participant acknowledges understanding prior to discharge.  Activity Barriers & Risk Stratification:  Activity Barriers & Cardiac Risk Stratification - 04/02/20 1012      Activity Barriers & Cardiac Risk Stratification   Activity Barriers Deconditioning    Cardiac Risk Stratification High           6 Minute Walk:  6 Minute Walk      Row Name 04/02/20 0956         6 Minute Walk   Phase Initial     Distance 1200 feet     Walk Time 6 minutes     # of Rest Breaks 0     MPH 2.27     METS 2.9     RPE 10     VO2 Peak 10.13     Symptoms No     Resting HR 87 bpm     Resting BP 110/72     Resting Oxygen Saturation  98 %     Exercise Oxygen Saturation  during 6 min walk 99 %     Max Ex. HR 100 bpm     Max Ex. BP 130/90     2 Minute Post BP 110/82            Oxygen Initial Assessment:   Oxygen Re-Evaluation:   Oxygen Discharge (Final Oxygen Re-Evaluation):   Initial Exercise Prescription:  Initial Exercise Prescription - 04/02/20 0900      Date of Initial Exercise RX and Referring Provider   Date 04/02/20    Referring Provider Dr. Angelena Form     Expected Discharge Date 07/03/20      Treadmill   MPH 1.5    Grade 0    Minutes 17    METs 2.14      T5 Nustep   Level 1    SPM 85    Minutes 22      Prescription Details   Frequency (times per week) 3    Duration Progress to 30 minutes of continuous aerobic without signs/symptoms of physical distress      Intensity   THRR 40-80% of Max Heartrate 69-138    Ratings of Perceived Exertion 11-13    Perceived Dyspnea 0-4      Resistance Training   Training Prescription Yes    Weight 2 lbs    Reps 10-15           Perform Capillary Blood Glucose checks as needed.  Exercise Prescription Changes:   Exercise Prescription Changes    Row Name 04/09/20 1100 04/20/20 1452 05/07/20 1200 05/16/20 1200       Response to Exercise   Blood Pressure (Admit) 124/86 110/70 126/82 114/84    Blood Pressure (Exercise) 136/80 130/74 122/76 130/82    Blood Pressure (Exit) 124/86 100/70 116/80 118/86    Heart Rate (Admit) 107 bpm 99 bpm 88 bpm 97 bpm    Heart Rate (Exercise) 127 bpm 122 bpm 128 bpm 118  bpm    Heart Rate (Exit) 113 bpm 107 bpm 97 bpm 104 bpm    Rating of Perceived Exertion (Exercise) _0 Duration Continue with 30 min of aerobic  exercise without signs/symptoms of physical distress. Continue with 30 min of aerobic exercise without signs/symptoms of physical distress. Continue with 30 min of aerobic exercise without signs/symptoms of physical distress. Continue with 30 min of aerobic exercise without signs/symptoms of physical distress.    Intensity THRR unchanged THRR unchanged THRR unchanged THRR unchanged      Progression   Progression Continue to progress workloads to maintain intensity without signs/symptoms of physical distress. Continue to progress workloads to maintain intensity without signs/symptoms of physical distress. Continue to progress workloads to maintain intensity without signs/symptoms of physical distress. Continue to progress workloads to maintain intensity without signs/symptoms of physical distress.      Resistance Training   Training Prescription Yes Yes Yes Yes    Weight 2 lbs 2 lbs 3 3    Reps 10-15 10-15 10-15 10-15      Treadmill   MPH 1.5 2.1 2 2.2    Grade 0 0 0 0    Minutes _1 METs 2.14 2.6 2.53 2.87      T5 Nustep   Level _2 SPM 105 102 62 104    Minutes _3 METs 2.1 2.4 2.3 2.1      Home Exercise Plan   Plans to continue exercise at -- -- -- [facility] (comment)    Frequency -- -- -- Add 2 additional days to program exercise sessions.    Initial Home Exercises Provided -- -- -- 05/16/20           Exercise Comments:   Exercise Comments    Row Name 05/16/20 1235           Exercise Comments Home exercise reviewed              Exercise Goals and Review:   Exercise Goals    Row Name 04/02/20 1002 05/14/20 1409 06/11/20 0842         Exercise Goals   Increase Physical Activity Yes Yes Yes     Intervention Provide advice, education, support and counseling about physical activity/exercise needs.;Develop an individualized exercise prescription for aerobic and resistive training based on initial evaluation findings, risk  stratification, comorbidities and participant's personal goals. Provide advice, education, support and counseling about physical activity/exercise needs.;Develop an individualized exercise prescription for aerobic and resistive training based on initial evaluation findings, risk stratification, comorbidities and participant's personal goals. Provide advice, education, support and counseling about physical activity/exercise needs.;Develop an individualized exercise prescription for aerobic and resistive training based on initial evaluation findings, risk stratification, comorbidities and participant's personal goals.     Expected Outcomes Short Term: Attend rehab on a regular basis to increase amount of physical activity.;Long Term: Add in home exercise to make exercise part of routine and to increase amount of physical activity.;Long Term: Exercising regularly at least 3-5 days a week. Short Term: Attend rehab on a regular basis to increase amount of physical activity.;Long Term: Add in home exercise to make exercise part of routine and to increase amount of physical activity.;Long Term: Exercising regularly at least 3-5 days a week. Short Term: Attend rehab on a regular basis to increase amount of physical activity.;Long Term: Add in home exercise to make  exercise part of routine and to increase amount of physical activity.;Long Term: Exercising regularly at least 3-5 days a week.     Increase Strength and Stamina Yes Yes Yes     Intervention Provide advice, education, support and counseling about physical activity/exercise needs.;Develop an individualized exercise prescription for aerobic and resistive training based on initial evaluation findings, risk stratification, comorbidities and participant's personal goals. Provide advice, education, support and counseling about physical activity/exercise needs.;Develop an individualized exercise prescription for aerobic and resistive training based on initial  evaluation findings, risk stratification, comorbidities and participant's personal goals. Provide advice, education, support and counseling about physical activity/exercise needs.;Develop an individualized exercise prescription for aerobic and resistive training based on initial evaluation findings, risk stratification, comorbidities and participant's personal goals.     Expected Outcomes Short Term: Increase workloads from initial exercise prescription for resistance, speed, and METs.;Short Term: Perform resistance training exercises routinely during rehab and add in resistance training at home;Long Term: Improve cardiorespiratory fitness, muscular endurance and strength as measured by increased METs and functional capacity (6MWT) Short Term: Increase workloads from initial exercise prescription for resistance, speed, and METs.;Short Term: Perform resistance training exercises routinely during rehab and add in resistance training at home;Long Term: Improve cardiorespiratory fitness, muscular endurance and strength as measured by increased METs and functional capacity (6MWT) Short Term: Increase workloads from initial exercise prescription for resistance, speed, and METs.;Short Term: Perform resistance training exercises routinely during rehab and add in resistance training at home;Long Term: Improve cardiorespiratory fitness, muscular endurance and strength as measured by increased METs and functional capacity (6MWT)     Able to understand and use rate of perceived exertion (RPE) scale Yes Yes Yes     Intervention Provide education and explanation on how to use RPE scale Provide education and explanation on how to use RPE scale Provide education and explanation on how to use RPE scale     Expected Outcomes Short Term: Able to use RPE daily in rehab to express subjective intensity level;Long Term:  Able to use RPE to guide intensity level when exercising independently Short Term: Able to use RPE daily in rehab  to express subjective intensity level;Long Term:  Able to use RPE to guide intensity level when exercising independently Short Term: Able to use RPE daily in rehab to express subjective intensity level;Long Term:  Able to use RPE to guide intensity level when exercising independently     Knowledge and understanding of Target Heart Rate Range (THRR) Yes Yes Yes     Intervention Provide education and explanation of THRR including how the numbers were predicted and where they are located for reference Provide education and explanation of THRR including how the numbers were predicted and where they are located for reference Provide education and explanation of THRR including how the numbers were predicted and where they are located for reference     Expected Outcomes Short Term: Able to state/look up THRR;Long Term: Able to use THRR to govern intensity when exercising independently;Short Term: Able to use daily as guideline for intensity in rehab Short Term: Able to state/look up THRR;Long Term: Able to use THRR to govern intensity when exercising independently;Short Term: Able to use daily as guideline for intensity in rehab Short Term: Able to state/look up THRR;Long Term: Able to use THRR to govern intensity when exercising independently;Short Term: Able to use daily as guideline for intensity in rehab     Able to check pulse independently Yes Yes Yes     Intervention  Review the importance of being able to check your own pulse for safety during independent exercise;Provide education and demonstration on how to check pulse in carotid and radial arteries. Review the importance of being able to check your own pulse for safety during independent exercise;Provide education and demonstration on how to check pulse in carotid and radial arteries. Review the importance of being able to check your own pulse for safety during independent exercise;Provide education and demonstration on how to check pulse in carotid and  radial arteries.     Expected Outcomes Short Term: Able to explain why pulse checking is important during independent exercise;Long Term: Able to check pulse independently and accurately Short Term: Able to explain why pulse checking is important during independent exercise;Long Term: Able to check pulse independently and accurately Short Term: Able to explain why pulse checking is important during independent exercise;Long Term: Able to check pulse independently and accurately     Understanding of Exercise Prescription Yes Yes Yes     Intervention Provide education, explanation, and written materials on patient's individual exercise prescription Provide education, explanation, and written materials on patient's individual exercise prescription Provide education, explanation, and written materials on patient's individual exercise prescription     Expected Outcomes Short Term: Able to explain program exercise prescription;Long Term: Able to explain home exercise prescription to exercise independently Short Term: Able to explain program exercise prescription;Long Term: Able to explain home exercise prescription to exercise independently Short Term: Able to explain program exercise prescription;Long Term: Able to explain home exercise prescription to exercise independently            Exercise Goals Re-Evaluation :  Exercise Goals Re-Evaluation    Row Name 05/14/20 1409 06/11/20 0842           Exercise Goal Re-Evaluation   Exercise Goals Review Increase Physical Activity;Increase Strength and Stamina;Able to understand and use rate of perceived exertion (RPE) scale;Knowledge and understanding of Target Heart Rate Range (THRR);Able to check pulse independently;Understanding of Exercise Prescription Increase Physical Activity;Increase Strength and Stamina;Able to understand and use rate of perceived exertion (RPE) scale;Knowledge and understanding of Target Heart Rate Range (THRR);Able to check pulse  independently;Understanding of Exercise Prescription      Comments Pt has attended 9 exercise sessions. She will be having a minor surgery on her toe on 8/6 and it is unclear how much time she will miss due to this. She has been progressing well in the program. She currently exercises at 3.1 METs on the stepper. Will continue to monitor and progress as able. Pt has attended 11 exercise sessions. She missed the past 3 weeks due to a surgery on her toe, and returned to exercise today. Her MET level has decreased to 2.3 METs from the 3.1 METs she was able to do before her surgery. Hopefully she will be able to reach that baseline again. Will continue to monitor and progress as able.      Expected Outcomes Pt will meet her goals and implement a home exercise program to accompany her exercise in cardiac rehab. Pt will meet her goals and implement a home exercise program to accompany her exercise in cardiac rehab.              Discharge Exercise Prescription (Final Exercise Prescription Changes):  Exercise Prescription Changes - 05/16/20 1200      Response to Exercise   Blood Pressure (Admit) 114/84    Blood Pressure (Exercise) 130/82    Blood Pressure (Exit) 118/86  Heart Rate (Admit) 97 bpm    Heart Rate (Exercise) 118 bpm    Heart Rate (Exit) 104 bpm    Rating of Perceived Exertion (Exercise) 11    Duration Continue with 30 min of aerobic exercise without signs/symptoms of physical distress.    Intensity THRR unchanged      Progression   Progression Continue to progress workloads to maintain intensity without signs/symptoms of physical distress.      Resistance Training   Training Prescription Yes    Weight 3    Reps 10-15      Treadmill   MPH 2.2    Grade 0    Minutes 17    METs 2.87      T5 Nustep   Level 2    SPM 104    Minutes 22    METs 2.1      Home Exercise Plan   Plans to continue exercise at [employer] [employer] (comment)    Frequency Add 2 additional days to  program exercise sessions.    Initial Home Exercises Provided 05/16/20           Nutrition:  Target Goals: Understanding of nutrition guidelines, daily intake of sodium <1522m, cholesterol <2095m calories 30% from fat and 7% or less from saturated fats, daily to have 5 or more servings of fruits and vegetables.  Biometrics:  Pre Biometrics - 04/02/20 1003      Pre Biometrics   Height 5' 4" (1.626 m)    Weight 123.8 kg    Waist Circumference 48 inches    Hip Circumference 54 inches    Waist to Hip Ratio 0.89 %    BMI (Calculated) 46.83    Triceps Skinfold 35 mm    % Body Fat 53.7 %    Grip Strength 40 kg    Flexibility 12 in    Single Leg Stand 12.37 seconds            Nutrition Therapy Plan and Nutrition Goals:  Nutrition Therapy & Goals - 06/11/20 1518      Personal Nutrition Goals   Comments Patient continues to work on reducing her soda intack and is trying to make healthy food choices for her and her family. Will continue to monitor.      Intervention Plan   Intervention Nutrition handout(s) given to patient.           Nutrition Assessments:  Nutrition Assessments - 04/02/20 0948      MEDFICTS Scores   Pre Score 12           Nutrition Goals Re-Evaluation:   Nutrition Goals Discharge (Final Nutrition Goals Re-Evaluation):   Psychosocial: Target Goals: Acknowledge presence or absence of significant depression and/or stress, maximize coping skills, provide positive support system. Participant is able to verbalize types and ability to use techniques and skills needed for reducing stress and depression.  Initial Review & Psychosocial Screening:  Initial Psych Review & Screening - 04/02/20 1025      Initial Review   Current issues with None Identified      Family Dynamics   Good Support System? Yes      Barriers   Psychosocial barriers to participate in program There are no identifiable barriers or psychosocial needs.      Screening  Interventions   Interventions Encouraged to exercise;To provide support and resources with identified psychosocial needs;Provide feedback about the scores to participant    Expected Outcomes Short Term goal: Identification and review with participant of  any Quality of Life or Depression concerns found by scoring the questionnaire.;Long Term goal: The participant improves quality of Life and PHQ9 Scores as seen by post scores and/or verbalization of changes           Quality of Life Scores:  Quality of Life - 04/02/20 0955      Quality of Life   Select Quality of Life      Quality of Life Scores   Health/Function Pre 18 %    Socioeconomic Pre 30 %    Psych/Spiritual Pre 29.14 %    Family Pre 30 %    GLOBAL Pre 24.69 %          Scores of 19 and below usually indicate a poorer quality of life in these areas.  A difference of  2-3 points is a clinically meaningful difference.  A difference of 2-3 points in the total score of the Quality of Life Index has been associated with significant improvement in overall quality of life, self-image, physical symptoms, and general health in studies assessing change in quality of life.  PHQ-9: Recent Review Flowsheet Data    Depression screen [facility] 2/9 04/02/2020 03/20/2020 05/12/2019 07/27/2018 01/05/2018   Decreased Interest 0 0 0 0 -   Down, Depressed, Hopeless 0 0 0 0 0   PHQ - 2 Score 0 0 0 0 0   Altered sleeping 0 - - - -   Tired, decreased energy 1 - - - -   Change in appetite 1 - - - -   Feeling bad or failure about yourself  0 - - - -   Trouble concentrating 0 - - - -   Moving slowly or fidgety/restless 0 - - - -   Suicidal thoughts 0 - - - -   PHQ-9 Score 2 - - - -   Difficult doing work/chores Somewhat difficult - - - -     Interpretation of Total Score  Total Score Depression Severity:  1-4 = Minimal depression, 5-9 = Mild depression, 10-14 = Moderate depression, 15-19 = Moderately severe depression, 20-27 = Severe depression    Psychosocial Evaluation and Intervention:  Psychosocial Evaluation - 04/02/20 1026      Psychosocial Evaluation & Interventions   Interventions Encouraged to exercise with the program and follow exercise prescription;Stress management education;Relaxation education    Comments Patient's initial QOL score was 24.96 and her PHQ_9 score was2. She has no psychosocial issues identified. She rated her stress level as low. 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 Will continue to montior.    Expected Outcomes Patient will have no psychosocial issues identifed at discharge.    Continue Psychosocial Services  No Follow up required           Psychosocial Re-Evaluation:  Psychosocial Re-Evaluation    [place] Name 04/12/20 1602 05/14/20 1609 06/11/20 1519         Psychosocial Re-Evaluation   Current issues with None Identified None Identified None Identified     Comments Patient's initial QOL score was 24.69% and her PHQ-9 score was 2 with no psychosocial issues identified. Will continue to monitor. Patient continues to have no psychosocial issues identified. Will continue to monitor. Patient continues to have no psychosocial issues identified. Will continue to monitor.     Expected Outcomes Patient will have no psychosocial issues identified at discharge. Patient will have no psychosocial issues identified at discharge. Patient will have no  psychosocial issues identified at discharge.     Interventions Encouraged to attend Cardiac Rehabilitation for the exercise;Stress management education;Relaxation education Encouraged to attend Cardiac Rehabilitation for the exercise;Stress management education;Relaxation education Encouraged to attend Cardiac Rehabilitation for the exercise;Stress management education;Relaxation education     Continue Psychosocial Services  No Follow up required No Follow up required No Follow up required            Psychosocial  Discharge (Final Psychosocial Re-Evaluation):  Psychosocial Re-Evaluation - 06/11/20 1519      Psychosocial Re-Evaluation   Current issues with None Identified    Comments Patient continues to have no psychosocial issues identified. Will continue to monitor.    Expected Outcomes Patient will have no psychosocial issues identified at discharge.    Interventions Encouraged to attend Cardiac Rehabilitation for the exercise;Stress management education;Relaxation education    Continue Psychosocial Services  No Follow up required           Vocational Rehabilitation: Provide vocational rehab assistance to qualifying candidates.   Vocational Rehab Evaluation & Intervention:  Vocational Rehab - 04/02/20 0949      Initial Vocational Rehab Evaluation & Intervention   Assessment shows need for Vocational Rehabilitation No      Vocational Rehab Re-Evaulation   Comments Patient has returned to work working full-time.           Education: Education Goals: Education classes will be provided on a weekly basis, covering required topics. Participant will state understanding/return demonstration of topics presented.  Learning Barriers/Preferences:  Learning Barriers/Preferences - 04/02/20 0948      Learning Barriers/Preferences   Learning Barriers None    Learning Preferences Skilled Demonstration;Written Material;Audio;Group Instruction           Education Topics: Hypertension, Hypertension Reduction -Define heart disease and high blood pressure. Discus how high blood pressure affects the body and ways to reduce high blood pressure.   CARDIAC REHAB PHASE II EXERCISE from 05/16/2020 in [place]  Date 04/18/20  Educator DF  Instruction Review Code 2- Demonstrated Understanding      Exercise and Your Heart -Discuss why it is important to exercise, the FITT principles of exercise, normal and abnormal responses to exercise, and how to exercise safely.   CARDIAC  REHAB PHASE II EXERCISE from 05/16/2020 in [place]  Date 04/25/20  Educator Etheleen Mayhew  Instruction Review Code 1- Verbalizes Understanding      Angina -Discuss definition of angina, causes of angina, treatment of angina, and how to decrease risk of having angina.   Cardiac Medications -Review what the following cardiac medications are used for, how they affect the body, and side effects that may occur when taking the medications.  Medications include Aspirin, Beta blockers, calcium channel blockers, ACE Inhibitors, angiotensin receptor blockers, diuretics, digoxin, and antihyperlipidemics.   CARDIAC REHAB PHASE II EXERCISE from 05/16/2020 in [place]  Date 05/09/20  Educator DF  Instruction Review Code 1- Verbalizes Understanding      Congestive Heart Failure -Discuss the definition of CHF, how to live with CHF, the signs and symptoms of CHF, and how keep track of weight and sodium intake.   CARDIAC REHAB PHASE II EXERCISE from 05/16/2020 in [place]  Date 05/16/20  Educator DF  Instruction Review Code 1- Verbalizes Understanding      Heart Disease and Intimacy -Discus the effect sexual activity has on the heart, how changes occur during intimacy as we age, and safety during  sexual activity.   Smoking Cessation / COPD -Discuss different methods to quit smoking, the health benefits of quitting smoking, and the definition of COPD.   Nutrition I: Fats -Discuss the types of cholesterol, what cholesterol does to the heart, and how cholesterol levels can be controlled.   Nutrition II: Labels -Discuss the different components of food labels and how to read food label   Heart Parts/Heart Disease and PAD -Discuss the anatomy of the heart, the pathway of blood circulation through the heart, and these are affected by heart disease.   Stress I: Signs and Symptoms -Discuss the causes of stress, how stress may  lead to anxiety and depression, and ways to limit stress.   Stress II: Relaxation -Discuss different types of relaxation techniques to limit stress.   Warning Signs of Stroke / TIA -Discuss definition of a stroke, what the signs and symptoms are of a stroke, and how to identify when someone is having stroke.   CARDIAC REHAB PHASE II EXERCISE from 05/16/2020 in [place]  Date 04/11/20  Educator Etheleen Mayhew  Instruction Review Code 1- Verbalizes Understanding      Knowledge Questionnaire Score:  Knowledge Questionnaire Score - 04/02/20 0949      Knowledge Questionnaire Score   Pre Score 9/24           Core Components/Risk Factors/Patient Goals at Admission:  Personal Goals and Risk Factors at Admission - 04/02/20 0949      Core Components/Risk Factors/Patient Goals on Admission    Weight Management Yes    Intervention Weight Management: Develop a combined nutrition and exercise program designed to reach desired caloric intake, while maintaining appropriate intake of nutrient and fiber, sodium and fats, and appropriate energy expenditure required for the weight goal.;Weight Management: Provide education and appropriate resources to help participant work on and attain dietary goals.;Weight Management/Obesity: Establish reasonable short term and long term weight goals.;Obesity: Provide education and appropriate resources to help participant work on and attain dietary goals.    Admit Weight 273 lb (123.8 kg)    Goal Weight: Short Term 268 lb (121.6 kg)    Goal Weight: Long Term 263 lb (119.3 kg)    Expected Outcomes Long Term: Adherence to nutrition and physical activity/exercise program aimed toward attainment of established weight goal;Weight Loss: Understanding of general recommendations for a balanced deficit meal plan, which promotes 1-2 lb weight loss per week and includes a negative energy balance of [id number] kcal/d    Personal Goal Other Yes    Personal  Goal Lose 10 lbs in program. Learn how to eat healthier and develop an healthy lifestyle overall.    Intervention Patient will participate in CR 3 days/week and supplement with exercise 2 days/week.    Expected Outcomes Patient will meet both program and personal goals.           Core Components/Risk Factors/Patient Goals Review:   Goals and Risk Factor Review    Row Name 04/12/20 1603 05/14/20 1609 06/11/20 1519         Core Components/Risk Factors/Patient Goals Review   Personal Goals Review Weight Management/Obesity;Other  Lose 10 lbs; Improve eating habits; live a healthier life. Weight Management/Obesity;Other  Lose 10 lbs; improve eating habits; live a healthier life. Weight Management/Obesity;Other  Lose 10 lbs; improve eating habits; live a healthier life.     Review Patient is new to the program completing 3 sessions. She is looking forward to meeting her personal goals. Will continue to monitor for  progress. Patient has completed 10 sessions gaining 2 lbs since her last 30 day review. She is doing well in the program with some progression. 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 She is not sure how long this will take her out of the program. She will resume activities as advised by her surgeon. Will continue to monitor. Patient has completed 12 sessions losing 1 lb since last 30 day review. She has been absent sicne 05/16/20 with surgery on her 4th right toe. She says the toe took longer to heal than she has thought. She was cleared by surgeon 06/01/20. Wound healing as expected and was released to full weight bearing as tolerated in regular shoe.  She says she hopes to regain the progress she has made before the surgery. She was not able to a lot post-op. Will continue to monitor for progress.     Expected Outcomes Patient will continue to attend sessions and complete the program meeting both program and personal goals. Patient will continue to  attend sessions and complete the program meeting both program and personal goals. Patient will continue to attend sessions and complete the program meeting both program and personal goals.            Core Components/Risk Factors/Patient Goals at Discharge (Final Review):   Goals and Risk Factor Review - 06/11/20 1519      Core Components/Risk Factors/Patient Goals Review   Personal Goals Review Weight Management/Obesity;Other   Lose 10 lbs; improve eating habits; live a healthier life.   Review Patient has completed 12 sessions losing 1 lb since last 30 day review. She has been absent sicne 05/16/20 with surgery on her 4th right toe. She says the toe took longer to heal than she has thought. She was cleared by surgeon 06/01/20. Wound healing as expected and was released to full weight bearing as tolerated in regular shoe.  She says she hopes to regain the progress she has made before the surgery. She was not able to a lot post-op. Will continue to monitor for progress.    Expected Outcomes Patient will continue to attend sessions and complete the program meeting both program and personal goals.           ITP Comments:   Comments: ITP REVIEW Pt is making expected progress toward Cardiac Rehab goals after completing 12 sessions. Recommend continued exercise, life style modification, education, and increased stamina and strength.

## 2020-06-13 NOTE — Progress Notes (Signed)
Daily Session Note  Patient Details  Name: [name] MRN: [id number] Date of Birth: 04-Jun-1971 Referring Provider:     CARDIAC REHAB PHASE II ORIENTATION from 04/02/2020 in [place]  Referring Provider Dr. Angelena Form       Encounter Date: 06/13/2020  Check In:  Session Check In - 06/13/20 0818      Check-In   Supervising physician immediately available to respond to emergencies See telemetry face sheet for immediately available MD    Location [facility]    Staff Present Hoy Register, MS, ACSM-CEP, Exercise Physiologist;Debra Wynetta Emery, RN, BSN    Virtual Visit No    Medication changes reported     No    Fall or balance concerns reported    No    Tobacco Cessation No Change    Warm-up and Cool-down Performed as group-led instruction    Resistance Training Performed Yes    VAD Patient? No    PAD/SET Patient? No      Pain Assessment   Currently in Pain? No/denies    Pain Score 0-No pain    Multiple Pain Sites No           Capillary Blood Glucose: No results found for this or any previous visit (from the past 24 hour(s)).    Social History   Tobacco Use  Smoking Status Never Smoker  Smokeless Tobacco Never Used    Goals Met:  Independence with exercise equipment Exercise tolerated well No report of cardiac concerns or symptoms Strength training completed today  Goals Unmet:  Not Applicable  Comments: checkout time is 0915   Dr. Kathie Dike is Medical Director for [facility] Pulmonary Rehab.

## 2020-06-14 ENCOUNTER — Encounter: Payer: BC Managed Care – PPO | Admitting: Podiatry

## 2020-06-15 ENCOUNTER — Encounter (HOSPITAL_COMMUNITY)
Admission: RE | Admit: 2020-06-15 | Discharge: 2020-06-15 | Disposition: A | Discharge status: Home / Self Care | Payer: BC Managed Care – PPO | Source: Ambulatory Visit | Attending: Cardiovascular Disease | Admitting: Cardiovascular Disease

## 2020-06-15 ENCOUNTER — Other Ambulatory Visit: Payer: Self-pay

## 2020-06-15 DIAGNOSIS — I213 ST elevation (STEMI) myocardial infarction of unspecified site: Secondary | ICD-10-CM | POA: Diagnosis not present

## 2020-06-15 DIAGNOSIS — Z955 Presence of coronary angioplasty implant and graft: Secondary | ICD-10-CM

## 2020-06-15 NOTE — Progress Notes (Signed)
Daily Session Note  Patient Details  Name: [name] MRN: [id number] Date of Birth: Oct 08, 1971 Referring Provider:     CARDIAC REHAB PHASE II ORIENTATION from 04/02/2020 in [place]  Referring Provider Dr. Angelena Form       Encounter Date: 06/15/2020  Check In:  Session Check In - 06/15/20 0816      Check-In   Supervising physician immediately available to respond to emergencies See telemetry face sheet for immediately available MD    Location [facility]    Staff Present Hoy Register, MS, ACSM-CEP, Exercise Physiologist;Debra Wynetta Emery, RN, BSN    Virtual Visit No    Medication changes reported     No    Fall or balance concerns reported    No    Tobacco Cessation No Change    Warm-up and Cool-down Performed as group-led instruction    Resistance Training Performed Yes    VAD Patient? No    PAD/SET Patient? No      Pain Assessment   Currently in Pain? No/denies    Pain Score 0-No pain    Multiple Pain Sites No           Capillary Blood Glucose: No results found for this or any previous visit (from the past 24 hour(s)).    Social History   Tobacco Use  Smoking Status Never Smoker  Smokeless Tobacco Never Used    Goals Met:  Independence with exercise equipment Exercise tolerated well No report of cardiac concerns or symptoms Strength training completed today  Goals Unmet:  Not Applicable  Comments: checkout time is 0915   Dr. Kathie Dike is Medical Director for [facility] Pulmonary Rehab.

## 2020-06-18 ENCOUNTER — Encounter (HOSPITAL_COMMUNITY): Payer: BC Managed Care – PPO

## 2020-06-20 ENCOUNTER — Encounter (HOSPITAL_COMMUNITY)
Admission: RE | Admit: 2020-06-20 | Discharge: 2020-06-20 | Disposition: A | Discharge status: Home / Self Care | Payer: BC Managed Care – PPO | Source: Ambulatory Visit | Attending: Cardiovascular Disease | Admitting: Cardiovascular Disease

## 2020-06-20 ENCOUNTER — Other Ambulatory Visit: Payer: Self-pay

## 2020-06-20 VITALS — Wt 280.4 lb

## 2020-06-20 DIAGNOSIS — I213 ST elevation (STEMI) myocardial infarction of unspecified site: Secondary | ICD-10-CM | POA: Diagnosis not present

## 2020-06-20 DIAGNOSIS — Z955 Presence of coronary angioplasty implant and graft: Secondary | ICD-10-CM | POA: Diagnosis not present

## 2020-06-20 NOTE — Progress Notes (Signed)
Daily Session Note  Patient Details  Name: [name] MRN: [id number] Date of Birth: 1971/05/05 Referring Provider:     CARDIAC REHAB PHASE II ORIENTATION from 04/02/2020 in [place]  Referring Provider Dr. Angelena Form       Encounter Date: 06/20/2020  Check In:  Session Check In - 06/20/20 1114      Check-In   PAD/SET Patient? No      Pain Assessment   Currently in Pain? No/denies    Pain Score 0-No pain    Multiple Pain Sites No           Capillary Blood Glucose: No results found for this or any previous visit (from the past 24 hour(s)).   Exercise Prescription Changes - 06/20/20 1200      Response to Exercise   Blood Pressure (Admit) 112/76    Blood Pressure (Exercise) 112/76    Blood Pressure (Exit) 108/80    Heart Rate (Admit) 94 bpm    Heart Rate (Exercise) 113 bpm    Heart Rate (Exit) 86 bpm    Rating of Perceived Exertion (Exercise) 12    Duration Continue with 30 min of aerobic exercise without signs/symptoms of physical distress.    Intensity THRR unchanged      Progression   Progression Continue to progress workloads to maintain intensity without signs/symptoms of physical distress.      Resistance Training   Training Prescription Yes    Weight 4 lbs    Reps 10-15      Treadmill   MPH 2    Grade 0    Minutes 17    METs 2.53      T5 Nustep   Level 2    SPM 103    Minutes 22    METs 2.2           Social History   Tobacco Use  Smoking Status Never Smoker  Smokeless Tobacco Never Used    Goals Met:  Independence with exercise equipment Exercise tolerated well No report of cardiac concerns or symptoms Strength training completed today  Goals Unmet:  Not Applicable  Comments: checkout time is 0915   Dr. Kathie Dike is Medical Director for [facility] Pulmonary Rehab.

## 2020-06-21 ENCOUNTER — Encounter: Payer: BC Managed Care – PPO | Admitting: Podiatry

## 2020-06-22 ENCOUNTER — Encounter (HOSPITAL_COMMUNITY)
Admission: RE | Admit: 2020-06-22 | Discharge: 2020-06-22 | Disposition: A | Discharge status: Home / Self Care | Payer: BC Managed Care – PPO | Source: Ambulatory Visit | Attending: Cardiovascular Disease | Admitting: Cardiovascular Disease

## 2020-06-22 ENCOUNTER — Other Ambulatory Visit: Payer: Self-pay

## 2020-06-22 DIAGNOSIS — Z955 Presence of coronary angioplasty implant and graft: Secondary | ICD-10-CM

## 2020-06-22 DIAGNOSIS — I213 ST elevation (STEMI) myocardial infarction of unspecified site: Secondary | ICD-10-CM | POA: Diagnosis not present

## 2020-06-22 NOTE — Progress Notes (Signed)
Daily Session Note  Patient Details  Name: [name] MRN: [id number] Date of Birth: [birth year] Referring Provider:     CARDIAC REHAB PHASE II ORIENTATION from 04/02/2020 in [place]  Referring Provider Dr. Angelena Form       Encounter Date: 06/22/2020  Check In:  Session Check In - 06/22/20 0814      Check-In   Supervising physician immediately available to respond to emergencies See telemetry face sheet for immediately available MD    Location [facility]    Staff Present Hoy Register, MS, ACSM-CEP, Exercise Physiologist;Carlette Wilber Oliphant, RN, BSN    Virtual Visit No    Medication changes reported     No    Fall or balance concerns reported    No    Tobacco Cessation No Change    Warm-up and Cool-down Performed as group-led instruction    Resistance Training Performed Yes    VAD Patient? No    PAD/SET Patient? No      Pain Assessment   Currently in Pain? No/denies           Capillary Blood Glucose: No results found for this or any previous visit (from the past 24 hour(s)).    Social History   Tobacco Use  Smoking Status Never Smoker  Smokeless Tobacco Never Used    Goals Met:  Independence with exercise equipment Exercise tolerated well No report of cardiac concerns or symptoms Strength training completed today  Goals Unmet:  Not Applicable  Comments: checkout time is 0915   Dr. Kathie Dike is Medical Director for [facility] Pulmonary Rehab.

## 2020-06-25 ENCOUNTER — Other Ambulatory Visit: Payer: Self-pay

## 2020-06-25 ENCOUNTER — Encounter (HOSPITAL_COMMUNITY)
Admission: RE | Admit: 2020-06-25 | Discharge: 2020-06-25 | Disposition: A | Discharge status: Home / Self Care | Payer: BC Managed Care – PPO | Source: Ambulatory Visit | Attending: Cardiovascular Disease | Admitting: Cardiovascular Disease

## 2020-06-25 DIAGNOSIS — Z955 Presence of coronary angioplasty implant and graft: Secondary | ICD-10-CM | POA: Diagnosis not present

## 2020-06-25 DIAGNOSIS — I213 ST elevation (STEMI) myocardial infarction of unspecified site: Secondary | ICD-10-CM | POA: Diagnosis not present

## 2020-06-25 NOTE — Progress Notes (Signed)
Daily Session Note  Patient Details  Name: ROSELINA BURGUENO MRN: [id number] Date of Birth: [birth year] Referring Provider:     CARDIAC REHAB PHASE II ORIENTATION from 04/02/2020 in [place]  Referring Provider Dr. Angelena Form       Encounter Date: 06/25/2020  Check In:  Session Check In - 06/25/20 0828      Check-In   Supervising physician immediately available to respond to emergencies See telemetry face sheet for immediately available MD    Location [facility]    Staff Present Hoy Register, MS, ACSM-CEP, Exercise Physiologist;Debra Wynetta Emery, RN, BSN    Virtual Visit No    Medication changes reported     No    Fall or balance concerns reported    No    Tobacco Cessation No Change    Warm-up and Cool-down Performed as group-led instruction    Resistance Training Performed Yes    VAD Patient? No    PAD/SET Patient? No      Pain Assessment   Currently in Pain? No/denies    Pain Score 0-No pain    Multiple Pain Sites No           Capillary Blood Glucose: No results found for this or any previous visit (from the past 24 hour(s)).    Social History   Tobacco Use  Smoking Status Never Smoker  Smokeless Tobacco Never Used    Goals Met:  Independence with exercise equipment Exercise tolerated well No report of cardiac concerns or symptoms Strength training completed today  Goals Unmet:  Not Applicable  Comments: checkout time is 0915   Dr. Kathie Dike is Medical Director for [facility] Pulmonary Rehab.

## 2020-06-27 ENCOUNTER — Encounter (HOSPITAL_COMMUNITY)
Admission: RE | Admit: 2020-06-27 | Discharge: 2020-06-27 | Disposition: A | Discharge status: Home / Self Care | Payer: BC Managed Care – PPO | Source: Ambulatory Visit | Attending: Cardiovascular Disease | Admitting: Cardiovascular Disease

## 2020-06-27 ENCOUNTER — Other Ambulatory Visit: Payer: Self-pay

## 2020-06-27 DIAGNOSIS — Z955 Presence of coronary angioplasty implant and graft: Secondary | ICD-10-CM | POA: Diagnosis not present

## 2020-06-27 DIAGNOSIS — I213 ST elevation (STEMI) myocardial infarction of unspecified site: Secondary | ICD-10-CM | POA: Diagnosis not present

## 2020-06-27 NOTE — Progress Notes (Signed)
Daily Session Note  Patient Details  Name: [name] MRN: [id number] Date of Birth: Dec 09, 1970 Referring Provider:     CARDIAC REHAB PHASE II ORIENTATION from 04/02/2020 in [place]  Referring Provider Dr. Angelena Form       Encounter Date: 06/27/2020  Check In:  Session Check In - 06/27/20 0824      Check-In   Supervising physician immediately available to respond to emergencies See telemetry face sheet for immediately available MD    Location [facility]    Staff Present Hoy Register, MS, ACSM-CEP, Exercise Physiologist;Debra Wynetta Emery, RN, BSN    Virtual Visit No    Medication changes reported     No    Fall or balance concerns reported    No    Tobacco Cessation No Change    Warm-up and Cool-down Performed as group-led instruction    Resistance Training Performed Yes    VAD Patient? No    PAD/SET Patient? No      Pain Assessment   Currently in Pain? No/denies    Pain Score 0-No pain    Multiple Pain Sites No           Capillary Blood Glucose: No results found for this or any previous visit (from the past 24 hour(s)).    Social History   Tobacco Use  Smoking Status Never Smoker  Smokeless Tobacco Never Used    Goals Met:  Independence with exercise equipment Exercise tolerated well No report of cardiac concerns or symptoms Strength training completed today  Goals Unmet:  Not Applicable  Comments: checkout time is 0915   Dr. Kathie Dike is Medical Director for [facility] Pulmonary Rehab.

## 2020-06-28 ENCOUNTER — Encounter: Payer: BC Managed Care – PPO | Admitting: Podiatry

## 2020-06-29 ENCOUNTER — Encounter (HOSPITAL_COMMUNITY)
Admission: RE | Admit: 2020-06-29 | Discharge: 2020-06-29 | Disposition: A | Discharge status: Home / Self Care | Payer: BC Managed Care – PPO | Source: Ambulatory Visit | Attending: Cardiovascular Disease | Admitting: Cardiovascular Disease

## 2020-06-29 ENCOUNTER — Encounter: Payer: Self-pay | Admitting: Cardiovascular Disease

## 2020-06-29 ENCOUNTER — Ambulatory Visit: Payer: BC Managed Care – PPO | Admitting: Cardiovascular Disease

## 2020-06-29 ENCOUNTER — Other Ambulatory Visit: Payer: Self-pay

## 2020-06-29 VITALS — BP 114/70 | HR 94 | Ht 64.0 in | Wt 280.0 lb

## 2020-06-29 DIAGNOSIS — Z955 Presence of coronary angioplasty implant and graft: Secondary | ICD-10-CM

## 2020-06-29 DIAGNOSIS — I2510 Atherosclerotic heart disease of native coronary artery without angina pectoris: Secondary | ICD-10-CM | POA: Diagnosis not present

## 2020-06-29 DIAGNOSIS — I213 ST elevation (STEMI) myocardial infarction of unspecified site: Secondary | ICD-10-CM | POA: Diagnosis not present

## 2020-06-29 NOTE — Progress Notes (Signed)
Chief Complaint  Patient presents with  . Follow-up    CAD    History of Present Illness: 49 yo female with history of CAD, obesity and HTN who is here today for cardiac follow up. She was admitted to [facility] April 2021 with an anterior STEMI. A drug eluting stent was placed in the LAD. LV function was normal. Echo April with LVEF=55-60%, no valve disease.   She is here today for follow up. The patient denies any chest pain, dyspnea, palpitations, lower extremity edema, orthopnea, PND, dizziness, near syncope or syncope.   Primary Care Physician: Fayrene Helper, MD   Past Medical History:  Diagnosis Date  . CAD (coronary artery disease), native coronary artery    s/p DES to LAD 02/01/20  . Chest pain 01/2020  . Facial numbness   . Fibroid   . Fx ankle   . Hypertension   . Obesity     Past Surgical History:  Procedure Laterality Date  . APPENDECTOMY    . APPLICATION OF CRANIAL NAVIGATION N/A 08/16/2019   Procedure: APPLICATION OF CRANIAL NAVIGATION;  Surgeon: Judith Part, MD;  Location: [place];  Service: Neurosurgery;  Laterality: N/A;  . bilateral breast reduction  2004  . CHOLECYSTECTOMY    . CORONARY STENT INTERVENTION N/A 02/01/2020   Procedure: CORONARY STENT INTERVENTION;  Surgeon: Burnell Blanks, MD;  Location: [place] [facility];  Service: Cardiovascular;  Laterality: N/A;  . CRANIOTOMY Left 08/16/2019   Procedure: Left craniotomy for tumor resection;  Surgeon: Judith Part, MD;  Location: [place];  Service: Neurosurgery;  Laterality: Left;  Left craniotomy for tumor resection  . INTRAVASCULAR PRESSURE WIRE/FFR STUDY N/A 02/01/2020   Procedure: INTRAVASCULAR PRESSURE WIRE/FFR STUDY;  Surgeon: Burnell Blanks, MD;  Location: [place] [facility];  Service: Cardiovascular;  Laterality: N/A;  . LEFT HEART CATH AND CORONARY ANGIOGRAPHY N/A 02/01/2020   Procedure: LEFT HEART CATH AND CORONARY ANGIOGRAPHY;  Surgeon: Burnell Blanks, MD;   Location: [place] [facility];  Service: Cardiovascular;  Laterality: N/A;  . REDUCTION MAMMAPLASTY Bilateral   . removal of left ovarian cyst  2003  . TUBAL LIGATION  2011   Women's, removal of tubes 2013    Current Outpatient Medications  Medication Sig Dispense Refill  . acetaminophen (TYLENOL) 325 MG tablet Take 325 mg by mouth every 6 (six) hours as needed.    Marland Kitchen amLODipine (NORVASC) 10 MG tablet Take 1 tablet by mouth once daily 90 tablet 0  . aspirin 81 MG chewable tablet Chew 1 tablet (81 mg total) by mouth daily. 90 tablet 1  . atorvastatin (LIPITOR) 80 MG tablet Take 1 tablet (80 mg total) by mouth daily. 90 tablet 3  . cloNIDine (CATAPRES) 0.3 MG tablet TAKE ONE TABLET BY MOUTH AT BEDTIME 90 tablet 3  . labetalol (NORMODYNE) 200 MG tablet TAKE ONE TABLET BY MOUTH ONCE DAILY EVERY MORNING 90 tablet 3  . lisinopril (ZESTRIL) 10 MG tablet TAKE 1 TABLET BY MOUTH AT BEDTIME 90 tablet 3  . ondansetron (ZOFRAN) 4 MG tablet Take 1 tablet (4 mg total) by mouth every 8 (eight) hours as needed. 20 tablet 0  . ticagrelor (BRILINTA) 90 MG TABS tablet Take 1 tablet (90 mg total) by mouth 2 (two) times daily. 180 tablet 3  . triamterene-hydrochlorothiazide (MAXZIDE) 75-50 MG tablet Take 1 tablet by mouth daily. 90 tablet 1  . nitroGLYCERIN (NITROSTAT) 0.4 MG SL tablet Place 1 tablet (0.4 mg total) under the tongue  every 5 (five) minutes as needed. 25 tablet 3   No current facility-administered medications for this visit.    No Known Allergies  Social History   Socioeconomic History  . Marital status: Single    Spouse name: Not on file  . Number of children: 4  . Years of education: Not on file  . Highest education level: Associate degree: occupational, Hotel manager, or vocational program  Occupational History  . Not on file  Tobacco Use  . Smoking status: Never Smoker  . Smokeless tobacco: Never Used  Vaping Use  . Vaping Use: Never used  Substance and Sexual Activity  . Alcohol use:  Yes    Comment: rarely  . Drug use: No  . Sexual activity: Yes    Birth control/protection: Surgical  Other Topics Concern  . Not on file  Social History Narrative   Lives with children   Caffeine- sodas, 48 oz daily   Social Determinants of Health   Financial Resource Strain:   . Difficulty of Paying Living Expenses: Not on file  Food Insecurity:   . Worried About Charity fundraiser in the Last Year: Not on file  . Ran Out of Food in the Last Year: Not on file  Transportation Needs:   . Lack of Transportation (Medical): Not on file  . Lack of Transportation (Non-Medical): Not on file  Physical Activity:   . Days of Exercise per Week: Not on file  . Minutes of Exercise per Session: Not on file  Stress:   . Feeling of Stress : Not on file  Social Connections:   . Frequency of Communication with Friends and Family: Not on file  . Frequency of Social Gatherings with Friends and Family: Not on file  . Attends Religious Services: Not on file  . Active Member of Clubs or Organizations: Not on file  . Attends Archivist Meetings: Not on file  . Marital Status: Not on file  Intimate Partner Violence:   . Fear of Current or Ex-Partner: Not on file  . Emotionally Abused: Not on file  . Physically Abused: Not on file  . Sexually Abused: Not on file    Family History  Problem Relation Age of Onset  . Coronary artery disease Mother   . Hypertension Mother   . Coronary artery disease Father   . Kidney disease Father   . Hypertension Father   . Multiple sclerosis Sister   . Hypertension Sister   . Stroke Maternal Grandmother   . Heart attack Maternal Grandfather   . Anesthesia problems Neg Hx   . Hypotension Neg Hx   . Malignant hyperthermia Neg Hx   . Pseudochol deficiency Neg Hx     Review of Systems:  As stated in the HPI and otherwise negative.   BP 114/70   Pulse 94   Ht 5\' 4"  (1.626 m)   Wt 280 lb (127 kg)   SpO2 96%   BMI 48.06 kg/m   Physical  Examination: General: Well developed, well nourished, NAD  HEENT: OP clear, mucus membranes moist  SKIN: warm, dry. No rashes. Neuro: No focal deficits  Musculoskeletal: Muscle strength 5/5 all ext  Psychiatric: Mood and affect normal  Neck: No JVD, no carotid bruits, no thyromegaly, no lymphadenopathy.  Lungs:Clear bilaterally, no wheezes, rhonci, crackles Cardiovascular: Regular rate and rhythm. No murmurs, gallops or rubs. Abdomen:Soft. Bowel sounds present. Non-tender.  Extremities: No lower extremity edema. Pulses are 2 + in the bilateral DP/PT.  EKG:  EKG is not ordered today. The ekg ordered today demonstrates   Echo April 2021:  1. Left ventricular ejection fraction, by estimation, is 55 to 60%. The  left ventricle has normal function. The left ventricle has no regional  wall motion abnormalities. There is moderate left ventricular hypertrophy.  Left ventricular diastolic  parameters are consistent with Grade I diastolic dysfunction (impaired  relaxation).  2. Right ventricular systolic function is normal. The right ventricular  size is normal. Tricuspid regurgitation signal is inadequate for assessing  PA pressure.  3. The mitral valve is grossly normal. No evidence of mitral valve  regurgitation. No evidence of mitral stenosis.  4. The aortic valve is tricuspid. Aortic valve regurgitation is not  visualized. No aortic stenosis is present.  5. The inferior vena cava is normal in size with greater than 50%  respiratory variability, suggesting right atrial pressure of 3 mmHg.   Recent Labs: 02/02/2020: BUN 11; Creatinine, Ser 0.64; Hemoglobin 11.9; Platelets 233; Potassium 3.7; Sodium 133 04/02/2020: ALT 16   Lipid Panel    Component Value Date/Time   CHOL 128 04/02/2020 1025   TRIG 67 04/02/2020 1025   HDL 60 04/02/2020 1025   CHOLHDL 2.1 04/02/2020 1025   CHOLHDL 2.9 02/02/2020 0830   VLDL 15 02/02/2020 0830   LDLCALC 54 04/02/2020 1025   LDLCALC 91  11/05/2017 0906     Wt Readings from Last 3 Encounters:  06/29/20 280 lb (127 kg)  06/20/20 280 lb 6.8 oz (127.2 kg)  05/16/20 277 lb 12.5 oz (126 kg)     Assessment and Plan:   1. CAD without angina: No chest pain. Continue ASA, Brilinta, beta blocker and statin. I will see her back at the end of April and if she is doing well, will stop Brilinta at that visit.   Current medicines are reviewed at length with the patient today.  The patient does not have concerns regarding medicines.  The following changes have been made:  no change  Labs/ tests ordered today include:  No orders of the defined types were placed in this encounter.   Disposition:   FU with me in 6 months  Signed, [name], MD 06/29/2020 12:35 PM    [address], [place], [place]  [address] Phone: [phone]; Fax: [phone]

## 2020-06-29 NOTE — Patient Instructions (Signed)
Medication Instructions:  No changes *If you need a refill on your cardiac medications before your next appointment, please call your pharmacy*   Lab Work: none If you have labs (blood work) drawn today and your tests are completely normal, you will receive your results only by: Marland Kitchen MyChart Message (if you have MyChart) OR . A paper copy in the mail If you have any lab test that is abnormal or we need to change your treatment, we will call you to review the results.   Testing/Procedures: none   Follow-Up: At [facility], you and your health needs are our priority.  As part of our continuing mission to provide you with exceptional heart care, we have created designated Provider Care Teams.  These Care Teams include your primary Cardiologist (physician) and Advanced Practice Providers (APPs -  Physician Assistants and Nurse Practitioners) who all work together to provide you with the care you need, when you need it.   Your next appointment:   7 month(s) (end of April 2022)  The format for your next appointment:   In Person  Provider:   You may see [name], MD or one of the following Advanced Practice Providers on your designated Care Team:    Melina Copa, PA-C  [name], PA-C    Other Instructions

## 2020-06-29 NOTE — Progress Notes (Signed)
Daily Session Note  Patient Details  Name: [name] MRN: [id number] Date of Birth: 1971-01-16 Referring Provider:     CARDIAC REHAB PHASE II ORIENTATION from 04/02/2020 in [place]  Referring Provider Dr. Angelena Form       Encounter Date: 06/29/2020  Check In:  Session Check In - 06/29/20 0812      Check-In   Supervising physician immediately available to respond to emergencies See telemetry face sheet for immediately available MD    Location [facility]    Staff Present Ramon Dredge, RN, MHA;Debra Wynetta Emery, RN, BSN    Virtual Visit No    Fall or balance concerns reported    No    Tobacco Cessation No Change    Warm-up and Cool-down Performed as group-led Higher education careers adviser Performed Yes    VAD Patient? No    PAD/SET Patient? No      Pain Assessment   Currently in Pain? No/denies    Pain Score 0-No pain    Multiple Pain Sites No           Capillary Blood Glucose: No results found for this or any previous visit (from the past 24 hour(s)).    Social History   Tobacco Use  Smoking Status Never Smoker  Smokeless Tobacco Never Used    Goals Met:  Independence with exercise equipment Exercise tolerated well No report of cardiac concerns or symptoms Strength training completed today  Goals Unmet:  Not Applicable  Comments: 1:69-8:29   Dr. Kathie Dike is Medical Director for [facility] Pulmonary Re[facility].

## 2020-07-02 ENCOUNTER — Other Ambulatory Visit: Payer: Self-pay

## 2020-07-02 ENCOUNTER — Encounter (HOSPITAL_COMMUNITY)
Admission: RE | Admit: 2020-07-02 | Discharge: 2020-07-02 | Disposition: A | Discharge status: Home / Self Care | Payer: BC Managed Care – PPO | Source: Ambulatory Visit | Attending: Cardiovascular Disease | Admitting: Cardiovascular Disease

## 2020-07-02 DIAGNOSIS — I213 ST elevation (STEMI) myocardial infarction of unspecified site: Secondary | ICD-10-CM

## 2020-07-02 DIAGNOSIS — Z955 Presence of coronary angioplasty implant and graft: Secondary | ICD-10-CM | POA: Diagnosis not present

## 2020-07-02 NOTE — Progress Notes (Signed)
Daily Session Note  Patient Details  Name: [name] MRN: [id number] Date of Birth: 30-Mar-1971 Referring Provider:     CARDIAC REHAB PHASE II ORIENTATION from 04/02/2020 in [place]  Referring Provider Dr. Angelena Form       Encounter Date: 07/02/2020  Check In:  Session Check In - 07/02/20 0833      Check-In   Supervising physician immediately available to respond to emergencies See telemetry face sheet for immediately available MD    Location [facility]    Staff Present Hoy Register, MS, ACSM-CEP, Exercise Physiologist;Debra Wynetta Emery, RN, BSN    Virtual Visit No    Medication changes reported     No    Fall or balance concerns reported    No    Tobacco Cessation No Change    Warm-up and Cool-down Performed as group-led instruction    Resistance Training Performed Yes    VAD Patient? No    PAD/SET Patient? No      Pain Assessment   Currently in Pain? No/denies    Pain Score 0-No pain    Multiple Pain Sites No           Capillary Blood Glucose: No results found for this or any previous visit (from the past 24 hour(s)).    Social History   Tobacco Use  Smoking Status Never Smoker  Smokeless Tobacco Never Used    Goals Met:  Independence with exercise equipment Exercise tolerated well No report of cardiac concerns or symptoms Strength training completed today  Goals Unmet:  Not Applicable  Comments: checkout time is 0915   Dr. Kathie Dike is Medical Director for [facility] Pulmonary Re[facility].

## 2020-07-04 ENCOUNTER — Other Ambulatory Visit: Payer: Self-pay

## 2020-07-04 ENCOUNTER — Encounter (HOSPITAL_COMMUNITY)
Admission: RE | Admit: 2020-07-04 | Discharge: 2020-07-04 | Disposition: A | Discharge status: Home / Self Care | Payer: BC Managed Care – PPO | Source: Ambulatory Visit | Attending: Cardiovascular Disease | Admitting: Cardiovascular Disease

## 2020-07-04 VITALS — Wt 280.9 lb

## 2020-07-04 DIAGNOSIS — I213 ST elevation (STEMI) myocardial infarction of unspecified site: Secondary | ICD-10-CM | POA: Diagnosis not present

## 2020-07-04 DIAGNOSIS — Z955 Presence of coronary angioplasty implant and graft: Secondary | ICD-10-CM

## 2020-07-04 NOTE — Progress Notes (Signed)
Daily Session Note  Patient Details  Name: [name] MRN: [id number] Date of Birth: 09-May-1971 Referring Provider:     CARDIAC REHAB PHASE II ORIENTATION from 04/02/2020 in [place]  Referring Provider Dr. Angelena Form       Encounter Date: 07/04/2020  Check In:  Session Check In - 07/04/20 0824      Check-In   Supervising physician immediately available to respond to emergencies See telemetry face sheet for immediately available MD    Location [facility]    Staff Present Hoy Register, MS, ACSM-CEP, Exercise Physiologist;Debra Wynetta Emery, RN, BSN    Virtual Visit No    Medication changes reported     No    Fall or balance concerns reported    No    Tobacco Cessation No Change    Warm-up and Cool-down Performed as group-led instruction    Resistance Training Performed Yes    VAD Patient? No    PAD/SET Patient? No      Pain Assessment   Currently in Pain? No/denies    Pain Score 0-No pain    Multiple Pain Sites No           Capillary Blood Glucose: No results found for this or any previous visit (from the past 24 hour(s)).    Social History   Tobacco Use  Smoking Status Never Smoker  Smokeless Tobacco Never Used    Goals Met:  Independence with exercise equipment Exercise tolerated well No report of cardiac concerns or symptoms Strength training completed today  Goals Unmet:  Not Applicable  Comments: checkout time is 0915   Dr. Kathie Dike is Medical Director for [facility] Pulmonary Re[facility].

## 2020-07-06 ENCOUNTER — Encounter (HOSPITAL_COMMUNITY)
Admission: RE | Admit: 2020-07-06 | Discharge: 2020-07-06 | Disposition: A | Discharge status: Home / Self Care | Payer: BC Managed Care – PPO | Source: Ambulatory Visit | Attending: Cardiovascular Disease | Admitting: Cardiovascular Disease

## 2020-07-06 ENCOUNTER — Other Ambulatory Visit: Payer: Self-pay

## 2020-07-06 DIAGNOSIS — I213 ST elevation (STEMI) myocardial infarction of unspecified site: Secondary | ICD-10-CM

## 2020-07-06 DIAGNOSIS — Z955 Presence of coronary angioplasty implant and graft: Secondary | ICD-10-CM | POA: Diagnosis not present

## 2020-07-06 NOTE — Progress Notes (Signed)
Daily Session Note  Patient Details  Name: [name] MRN: [id number] Date of Birth: Dec 19, 1970 Referring Provider:     CARDIAC REHAB PHASE II ORIENTATION from 04/02/2020 in [place]  Referring Provider Dr. Angelena Form       Encounter Date: 07/06/2020  Check In:  Session Check In - 07/06/20 0817      Check-In   Supervising physician immediately available to respond to emergencies See telemetry face sheet for immediately available MD    Location [facility]    Staff Present Ramon Dredge, RN, Madelaine Bhat, RN, BSN    Virtual Visit No    Medication changes reported     No    Fall or balance concerns reported    No    Tobacco Cessation No Change    Warm-up and Cool-down Performed as group-led instruction    Resistance Training Performed Yes    VAD Patient? No    PAD/SET Patient? No      Pain Assessment   Currently in Pain? No/denies           Capillary Blood Glucose: No results found for this or any previous visit (from the past 24 hour(s)).    Social History   Tobacco Use  Smoking Status Never Smoker  Smokeless Tobacco Never Used    Goals Met:  Independence with exercise equipment Exercise tolerated well No report of cardiac concerns or symptoms Strength training completed today  Goals Unmet:  Not Applicable  Comments: [phone]   Dr. Kathie Dike is Medical Director for [facility].

## 2020-07-09 ENCOUNTER — Encounter (HOSPITAL_COMMUNITY): Payer: BC Managed Care – PPO

## 2020-07-11 ENCOUNTER — Other Ambulatory Visit: Payer: Self-pay

## 2020-07-11 ENCOUNTER — Encounter (HOSPITAL_COMMUNITY)
Admission: RE | Admit: 2020-07-11 | Discharge: 2020-07-11 | Disposition: A | Discharge status: Home / Self Care | Payer: BC Managed Care – PPO | Source: Ambulatory Visit | Attending: Cardiovascular Disease | Admitting: Cardiovascular Disease

## 2020-07-11 DIAGNOSIS — Z955 Presence of coronary angioplasty implant and graft: Secondary | ICD-10-CM | POA: Diagnosis not present

## 2020-07-11 DIAGNOSIS — I213 ST elevation (STEMI) myocardial infarction of unspecified site: Secondary | ICD-10-CM

## 2020-07-11 NOTE — Progress Notes (Signed)
Cardiac Individual Treatment Plan  Patient Details  Name: [name] MRN: [id number] Date of Birth: Aug 23, 1971 Referring Provider:     CARDIAC REHAB PHASE II ORIENTATION from 04/02/2020 in [facility]  Referring Provider Dr. Angelena Form       Initial Encounter Date:    CARDIAC REHAB PHASE II ORIENTATION from 04/02/2020 in [facility]  Date 04/02/20      Visit Diagnosis: ST elevation myocardial infarction (STEMI), unspecified artery ([place])  Status post coronary artery stent placement  Patient's Home Medications on Admission:  Current Outpatient Medications:  .  acetaminophen (TYLENOL) 325 MG tablet, Take 325 mg by mouth every 6 (six) hours as needed., Disp: , Rfl:  .  amLODipine (NORVASC) 10 MG tablet, Take 1 tablet by mouth once daily, Disp: 90 tablet, Rfl: 0 .  aspirin 81 MG chewable tablet, Chew 1 tablet (81 mg total) by mouth daily., Disp: 90 tablet, Rfl: 1 .  atorvastatin (LIPITOR) 80 MG tablet, Take 1 tablet (80 mg total) by mouth daily., Disp: 90 tablet, Rfl: 3 .  cloNIDine (CATAPRES) 0.3 MG tablet, TAKE ONE TABLET BY MOUTH AT BEDTIME, Disp: 90 tablet, Rfl: 3 .  labetalol (NORMODYNE) 200 MG tablet, TAKE ONE TABLET BY MOUTH ONCE DAILY EVERY MORNING, Disp: 90 tablet, Rfl: 3 .  lisinopril (ZESTRIL) 10 MG tablet, TAKE 1 TABLET BY MOUTH AT BEDTIME, Disp: 90 tablet, Rfl: 3 .  nitroGLYCERIN (NITROSTAT) 0.4 MG SL tablet, Place 1 tablet (0.4 mg total) under the tongue every 5 (five) minutes as needed., Disp: 25 tablet, Rfl: 3 .  ondansetron (ZOFRAN) 4 MG tablet, Take 1 tablet (4 mg total) by mouth every 8 (eight) hours as needed., Disp: 20 tablet, Rfl: 0 .  ticagrelor (BRILINTA) 90 MG TABS tablet, Take 1 tablet (90 mg total) by mouth 2 (two) times daily., Disp: 180 tablet, Rfl: 3 .  triamterene-hydrochlorothiazide (MAXZIDE) 75-50 MG tablet, Take 1 tablet by mouth daily., Disp: 90 tablet, Rfl: 1  Past Medical History: Past Medical History:    Diagnosis Date  . CAD (coronary artery disease), native coronary artery    s/p DES to LAD 02/01/20  . Chest pain 01/2020  . Facial numbness   . Fibroid   . Fx ankle   . Hypertension   . Obesity     Tobacco Use: Social History   Tobacco Use  Smoking Status Never Smoker  Smokeless Tobacco Never Used    Labs: Recent Review Flowsheet Data    Labs for ITP Cardiac and Pulmonary Rehab Latest Ref Rng & Units 11/05/2017 08/16/2019 08/16/2019 02/02/2020 04/02/2020   Cholestrol 100 - 199 mg/dL 161 - - 162 128   LDLCALC 0 - 99 mg/dL 91 - - 92 54   HDL >39 mg/dL 52 - - 55 60   Trlycerides 0 - 149 mg/dL 89 - - 75 67   Hemoglobin A1c 4.8 - 5.6 % 5.0 - - 5.0 -   PHART 7.35 - 7.45 - 7.387 - - -   PCO2ART 32 - 48 mmHg - 40.1 - - -   HCO3 20.0 - 28.0 mmol/L - 24.5 - - -   TCO2 22 - 32 mmol/L - 26 24 - -   ACIDBASEDEF 0.0 - 2.0 mmol/L - 1.0 - - -   O2SAT % - 99.0 - - -      Capillary Blood Glucose: Lab Results  Component Value Date   GLUCAP 116 (H) 11/06/2009   GLUCAP 112 (H) 08/22/2009  GLUCAP 139 (H) 08/21/2009   GLUCAP 131 (H) 08/21/2009     Exercise Target Goals: Exercise Program Goal: Individual exercise prescription set using results from initial 6 min walk test and THRR while considering  patient's activity barriers and safety.   Exercise Prescription Goal: Starting with aerobic activity 30 plus minutes a day, 3 days per week for initial exercise prescription. Provide home exercise prescription and guidelines that participant acknowledges understanding prior to discharge.  Activity Barriers & Risk Stratification:  Activity Barriers & Cardiac Risk Stratification - 04/02/20 1012      Activity Barriers & Cardiac Risk Stratification   Activity Barriers Deconditioning    Cardiac Risk Stratification High           6 Minute Walk:  6 Minute Walk    Row Name 04/02/20 0956         6 Minute Walk   Phase Initial     Distance 1200 feet     Walk Time 6 minutes     # of  Rest Breaks 0     MPH 2.27     METS 2.9     RPE 10     VO2 Peak 10.13     Symptoms No     Resting HR 87 bpm     Resting BP 110/72     Resting Oxygen Saturation  98 %     Exercise Oxygen Saturation  during 6 min walk 99 %     Max Ex. HR 100 bpm     Max Ex. BP 130/90     2 Minute Post BP 110/82            Oxygen Initial Assessment:   Oxygen Re-Evaluation:   Oxygen Discharge (Final Oxygen Re-Evaluation):   Initial Exercise Prescription:  Initial Exercise Prescription - 04/02/20 0900      Date of Initial Exercise RX and Referring Provider   Date 04/02/20    Referring Provider Dr. Angelena Form     Expected Discharge Date 07/03/20      Treadmill   MPH 1.5    Grade 0    Minutes 17    METs 2.14      T5 Nustep   Level 1    SPM 85    Minutes 22      Prescription Details   Frequency (times per week) 3    Duration Progress to 30 minutes of continuous aerobic without signs/symptoms of physical distress      Intensity   THRR 40-80% of Max Heartrate 69-138    Ratings of Perceived Exertion 11-13    Perceived Dyspnea 0-4      Resistance Training   Training Prescription Yes    Weight 2 lbs    Reps 10-15           Perform Capillary Blood Glucose checks as needed.  Exercise Prescription Changes:   Exercise Prescription Changes    Row Name 04/09/20 1100 04/20/20 1452 05/07/20 1200 05/16/20 1200 06/20/20 1200     Response to Exercise   Blood Pressure (Admit) 124/86 110/70 126/82 114/84 112/76   Blood Pressure (Exercise) 136/80 130/74 122/76 130/82 112/76   Blood Pressure (Exit) 124/86 100/70 116/80 118/86 108/80   Heart Rate (Admit) 107 bpm 99 bpm 88 bpm 97 bpm 94 bpm   Heart Rate (Exercise) 127 bpm 122 bpm 128 bpm 118 bpm 113 bpm   Heart Rate (Exit) 113 bpm 107 bpm 97 bpm 104 bpm 86 bpm   Rating of Perceived  Exertion (Exercise) '12 11 11 11 12   ' Duration Continue with 30 min of aerobic exercise without signs/symptoms of physical distress. Continue with 30 min of  aerobic exercise without signs/symptoms of physical distress. Continue with 30 min of aerobic exercise without signs/symptoms of physical distress. Continue with 30 min of aerobic exercise without signs/symptoms of physical distress. Continue with 30 min of aerobic exercise without signs/symptoms of physical distress.   Intensity THRR unchanged THRR unchanged THRR unchanged THRR unchanged THRR unchanged     Progression   Progression Continue to progress workloads to maintain intensity without signs/symptoms of physical distress. Continue to progress workloads to maintain intensity without signs/symptoms of physical distress. Continue to progress workloads to maintain intensity without signs/symptoms of physical distress. Continue to progress workloads to maintain intensity without signs/symptoms of physical distress. Continue to progress workloads to maintain intensity without signs/symptoms of physical distress.     Resistance Training   Training Prescription Yes Yes Yes Yes Yes   Weight 2 lbs 2 lbs '3 3 4 ' lbs   Reps 10-15 10-15 10-15 10-15 10-15     Treadmill   MPH 1.5 2.1 2 2.2 2   Grade 0 0 0 0 0   Minutes '17 17 17 17 17   ' METs 2.14 2.6 2.53 2.87 2.53     T5 Nustep   Level '1 1 2 2 2   ' SPM 105 102 62 104 103   Minutes '22 22 22 22 22   ' METs 2.1 2.4 2.3 2.1 2.2     Home Exercise Plan   Plans to continue exercise at -- -- -- [employer] (comment) --   Frequency -- -- -- Add 2 additional days to program exercise sessions. --   Initial Home Exercises Provided -- -- -- 05/16/20 --   [place] Name 07/04/20 1000             Response to Exercise   Blood Pressure (Admit) 100/70       Blood Pressure (Exercise) 120/88       Blood Pressure (Exit) 116/84       Heart Rate (Admit) 93 bpm       Heart Rate (Exercise) 118 bpm       Heart Rate (Exit) 102 bpm       Rating of Perceived Exertion (Exercise) 11       Duration Continue with 30 min of aerobic exercise without signs/symptoms of  physical distress.       Intensity THRR unchanged         Progression   Progression Continue to progress workloads to maintain intensity without signs/symptoms of physical distress.         Resistance Training   Training Prescription Yes       Weight 4       Reps 10-15         Treadmill   MPH 2.2       Grade 0       Minutes 17       METs 2.68         T5 Nustep   Level 2       SPM 103       Minutes 22       METs 2.2              Exercise Comments:   Exercise Comments    Row Name 05/16/20 1235           Exercise Comments Home exercise reviewed  Exercise Goals and Review:   Exercise Goals    Row Name 04/02/20 1002 05/14/20 1409 06/11/20 0842 07/09/20 1337       Exercise Goals   Increase Physical Activity Yes Yes Yes Yes    Intervention Provide advice, education, support and counseling about physical activity/exercise needs.;Develop an individualized exercise prescription for aerobic and resistive training based on initial evaluation findings, risk stratification, comorbidities and participant's personal goals. Provide advice, education, support and counseling about physical activity/exercise needs.;Develop an individualized exercise prescription for aerobic and resistive training based on initial evaluation findings, risk stratification, comorbidities and participant's personal goals. Provide advice, education, support and counseling about physical activity/exercise needs.;Develop an individualized exercise prescription for aerobic and resistive training based on initial evaluation findings, risk stratification, comorbidities and participant's personal goals. Provide advice, education, support and counseling about physical activity/exercise needs.;Develop an individualized exercise prescription for aerobic and resistive training based on initial evaluation findings, risk stratification, comorbidities and participant's personal goals.    Expected Outcomes  Short Term: Attend rehab on a regular basis to increase amount of physical activity.;Long Term: Add in home exercise to make exercise part of routine and to increase amount of physical activity.;Long Term: Exercising regularly at least 3-5 days a week. Short Term: Attend rehab on a regular basis to increase amount of physical activity.;Long Term: Add in home exercise to make exercise part of routine and to increase amount of physical activity.;Long Term: Exercising regularly at least 3-5 days a week. Short Term: Attend rehab on a regular basis to increase amount of physical activity.;Long Term: Add in home exercise to make exercise part of routine and to increase amount of physical activity.;Long Term: Exercising regularly at least 3-5 days a week. Short Term: Attend rehab on a regular basis to increase amount of physical activity.;Long Term: Add in home exercise to make exercise part of routine and to increase amount of physical activity.;Long Term: Exercising regularly at least 3-5 days a week.    Increase Strength and Stamina Yes Yes Yes Yes    Intervention Provide advice, education, support and counseling about physical activity/exercise needs.;Develop an individualized exercise prescription for aerobic and resistive training based on initial evaluation findings, risk stratification, comorbidities and participant's personal goals. Provide advice, education, support and counseling about physical activity/exercise needs.;Develop an individualized exercise prescription for aerobic and resistive training based on initial evaluation findings, risk stratification, comorbidities and participant's personal goals. Provide advice, education, support and counseling about physical activity/exercise needs.;Develop an individualized exercise prescription for aerobic and resistive training based on initial evaluation findings, risk stratification, comorbidities and participant's personal goals. Provide advice, education,  support and counseling about physical activity/exercise needs.;Develop an individualized exercise prescription for aerobic and resistive training based on initial evaluation findings, risk stratification, comorbidities and participant's personal goals.    Expected Outcomes Short Term: Increase workloads from initial exercise prescription for resistance, speed, and METs.;Short Term: Perform resistance training exercises routinely during rehab and add in resistance training at home;Long Term: Improve cardiorespiratory fitness, muscular endurance and strength as measured by increased METs and functional capacity (6MWT) Short Term: Increase workloads from initial exercise prescription for resistance, speed, and METs.;Short Term: Perform resistance training exercises routinely during rehab and add in resistance training at home;Long Term: Improve cardiorespiratory fitness, muscular endurance and strength as measured by increased METs and functional capacity (6MWT) Short Term: Increase workloads from initial exercise prescription for resistance, speed, and METs.;Short Term: Perform resistance training exercises routinely during rehab and add in resistance training at home;Long Term:  Improve cardiorespiratory fitness, muscular endurance and strength as measured by increased METs and functional capacity (6MWT) Short Term: Increase workloads from initial exercise prescription for resistance, speed, and METs.;Short Term: Perform resistance training exercises routinely during rehab and add in resistance training at home;Long Term: Improve cardiorespiratory fitness, muscular endurance and strength as measured by increased METs and functional capacity (6MWT)    Able to understand and use rate of perceived exertion (RPE) scale Yes Yes Yes Yes    Intervention Provide education and explanation on how to use RPE scale Provide education and explanation on how to use RPE scale Provide education and explanation on how to use RPE  scale Provide education and explanation on how to use RPE scale    Expected Outcomes Short Term: Able to use RPE daily in rehab to express subjective intensity level;Long Term:  Able to use RPE to guide intensity level when exercising independently Short Term: Able to use RPE daily in rehab to express subjective intensity level;Long Term:  Able to use RPE to guide intensity level when exercising independently Short Term: Able to use RPE daily in rehab to express subjective intensity level;Long Term:  Able to use RPE to guide intensity level when exercising independently Short Term: Able to use RPE daily in rehab to express subjective intensity level;Long Term:  Able to use RPE to guide intensity level when exercising independently    Knowledge and understanding of Target Heart Rate Range (THRR) Yes Yes Yes Yes    Intervention Provide education and explanation of THRR including how the numbers were predicted and where they are located for reference Provide education and explanation of THRR including how the numbers were predicted and where they are located for reference Provide education and explanation of THRR including how the numbers were predicted and where they are located for reference Provide education and explanation of THRR including how the numbers were predicted and where they are located for reference    Expected Outcomes Short Term: Able to state/look up THRR;Long Term: Able to use THRR to govern intensity when exercising independently;Short Term: Able to use daily as guideline for intensity in rehab Short Term: Able to state/look up THRR;Long Term: Able to use THRR to govern intensity when exercising independently;Short Term: Able to use daily as guideline for intensity in rehab Short Term: Able to state/look up THRR;Long Term: Able to use THRR to govern intensity when exercising independently;Short Term: Able to use daily as guideline for intensity in rehab Short Term: Able to state/look up  THRR;Long Term: Able to use THRR to govern intensity when exercising independently;Short Term: Able to use daily as guideline for intensity in rehab    Able to check pulse independently Yes Yes Yes Yes    Intervention Review the importance of being able to check your own pulse for safety during independent exercise;Provide education and demonstration on how to check pulse in carotid and radial arteries. Review the importance of being able to check your own pulse for safety during independent exercise;Provide education and demonstration on how to check pulse in carotid and radial arteries. Review the importance of being able to check your own pulse for safety during independent exercise;Provide education and demonstration on how to check pulse in carotid and radial arteries. Review the importance of being able to check your own pulse for safety during independent exercise;Provide education and demonstration on how to check pulse in carotid and radial arteries.    Expected Outcomes Short Term: Able to explain why pulse checking  is important during independent exercise;Long Term: Able to check pulse independently and accurately Short Term: Able to explain why pulse checking is important during independent exercise;Long Term: Able to check pulse independently and accurately Short Term: Able to explain why pulse checking is important during independent exercise;Long Term: Able to check pulse independently and accurately Short Term: Able to explain why pulse checking is important during independent exercise;Long Term: Able to check pulse independently and accurately    Understanding of Exercise Prescription Yes Yes Yes Yes    Intervention Provide education, explanation, and written materials on patient's individual exercise prescription Provide education, explanation, and written materials on patient's individual exercise prescription Provide education, explanation, and written materials on patient's individual  exercise prescription Provide education, explanation, and written materials on patient's individual exercise prescription    Expected Outcomes Short Term: Able to explain program exercise prescription;Long Term: Able to explain home exercise prescription to exercise independently Short Term: Able to explain program exercise prescription;Long Term: Able to explain home exercise prescription to exercise independently Short Term: Able to explain program exercise prescription;Long Term: Able to explain home exercise prescription to exercise independently Short Term: Able to explain program exercise prescription;Long Term: Able to explain home exercise prescription to exercise independently           Exercise Goals Re-Evaluation :  Exercise Goals Re-Evaluation    Row Name 05/14/20 1409 06/11/20 0842 07/09/20 1337         Exercise Goal Re-Evaluation   Exercise Goals Review Increase Physical Activity;Increase Strength and Stamina;Able to understand and use rate of perceived exertion (RPE) scale;Knowledge and understanding of Target Heart Rate Range (THRR);Able to check pulse independently;Understanding of Exercise Prescription Increase Physical Activity;Increase Strength and Stamina;Able to understand and use rate of perceived exertion (RPE) scale;Knowledge and understanding of Target Heart Rate Range (THRR);Able to check pulse independently;Understanding of Exercise Prescription Increase Physical Activity;Increase Strength and Stamina;Able to understand and use rate of perceived exertion (RPE) scale;Knowledge and understanding of Target Heart Rate Range (THRR);Able to check pulse independently;Understanding of Exercise Prescription     Comments Pt has attended 9 exercise sessions. She will be having a minor surgery on her toe on 8/6 and it is unclear how much time she will miss due to this. She has been progressing well in the program. She currently exercises at 3.1 METs on the stepper. Will continue to  monitor and progress as able. Pt has attended 11 exercise sessions. She missed the past 3 weeks due to a surgery on her toe, and returned to exercise today. Her MET level has decreased to 2.3 METs from the 3.1 METs she was able to do before her surgery. Hopefully she will be able to reach that baseline again. Will continue to monitor and progress as able. Pt has attended 21 exercise sessions. Her attendance has been much better recently. Her MET levels have not increased to her pre surgery levels. I think that this is due to lack of motivation or lack of focus. She is constantly on her phone while she is on the stepper taking calls for work or texting people for work. She currently exercises at 2.1 METs on the stepper. Will continue to monitor and progress as able.     Expected Outcomes Pt will meet her goals and implement a home exercise program to accompany her exercise in cardiac rehab. Pt will meet her goals and implement a home exercise program to accompany her exercise in cardiac rehab. Pt will meet her goals and implement  a home exercise program to accompany her exercise in cardiac rehab.             Discharge Exercise Prescription (Final Exercise Prescription Changes):  Exercise Prescription Changes - 07/04/20 1000      Response to Exercise   Blood Pressure (Admit) 100/70    Blood Pressure (Exercise) 120/88    Blood Pressure (Exit) 116/84    Heart Rate (Admit) 93 bpm    Heart Rate (Exercise) 118 bpm    Heart Rate (Exit) 102 bpm    Rating of Perceived Exertion (Exercise) 11    Duration Continue with 30 min of aerobic exercise without signs/symptoms of physical distress.    Intensity THRR unchanged      Progression   Progression Continue to progress workloads to maintain intensity without signs/symptoms of physical distress.      Resistance Training   Training Prescription Yes    Weight 4    Reps 10-15      Treadmill   MPH 2.2    Grade 0    Minutes 17    METs 2.68      T5  Nustep   Level 2    SPM 103    Minutes 22    METs 2.2           Nutrition:  Target Goals: Understanding of nutrition guidelines, daily intake of sodium <1568m, cholesterol <2048m calories 30% from fat and 7% or less from saturated fats, daily to have 5 or more servings of fruits and vegetables.  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      Intervention Plan   Intervention Nutrition handout(s) given to patient.           Nutrition Assessments:  Nutrition Assessments - 04/02/20 0948      MEDFICTS Scores   Pre Score 12           Nutrition Goals Re-Evaluation:   Nutrition Goals Discharge (Final Nutrition Goals Re-Evaluation):   Psychosocial: Target Goals: Acknowledge presence or absence of significant depression and/or stress, maximize coping skills, provide positive support system. Participant is able to verbalize types and ability to use techniques and skills needed for reducing stress and depression.  Initial Review & Psychosocial Screening:  Initial Psych Review & Screening - 04/02/20 1025      Initial Review   Current issues with None Identified      Family Dynamics   Good Support System? Yes      Barriers   Psychosocial barriers to participate in program There are no identifiable barriers or psychosocial needs.      Screening Interventions   Interventions Encouraged to exercise;To provide support and  resources with identified psychosocial needs;Provide feedback about the scores  to participant    Expected Outcomes Short Term goal: Identification and review with participant of any Quality of Life or Depression concerns found by scoring the questionnaire.;Long Term goal: The participant improves quality of Life and PHQ9 Scores as seen by post scores and/or verbalization of changes           Quality of Life Scores:  Quality of Life - 04/02/20 0955      Quality of Life   Select Quality of Life      Quality of Life Scores   Health/Function Pre 18 %    Socioeconomic Pre 30 %    Psych/Spiritual Pre 29.14 %    Family Pre 30 %    GLOBAL Pre 24.69 %          Scores of 19 and below usually indicate a poorer quality of life in these areas.  A difference of  2-3 points is a clinically meaningful difference.  A difference of 2-3 points in the total score of the Quality of Life Index has been associated with significant improvement in overall quality of life, self-image, physical symptoms, and general health in studies assessing change in quality of life.  PHQ-9: Recent Review Flowsheet Data    Depression screen [facility] 2/9 04/02/2020 03/20/2020 05/12/2019 07/27/2018 01/05/2018   Decreased Interest 0 0 0 0 -   Down, Depressed, Hopeless 0 0 0 0 0   PHQ - 2 Score 0 0 0 0 0   Altered sleeping 0 - - - -   Tired, decreased energy 1 - - - -   Change in appetite 1 - - - -   Feeling bad or failure about yourself  0 - - - -   Trouble concentrating 0 - - - -   Moving slowly or fidgety/restless 0 - - - -   Suicidal thoughts 0 - - - -   PHQ-9 Score 2 - - - -   Difficult doing work/chores Somewhat difficult - - - -     Interpretation of Total Score  Total Score Depression Severity:  1-4 = Minimal depression, 5-9 = Mild depression, 10-14 = Moderate depression, 15-19 = Moderately severe depression, 20-27 = Severe depression   Psychosocial Evaluation and Intervention:  Psychosocial Evaluation - 04/02/20  1026      Psychosocial Evaluation & Interventions   Interventions Encouraged to exercise with the program and follow exercise prescription;Stress management education;Relaxation education    Comments Patient's initial QOL score was 24.96 and her PHQ_9 score was2. She has no psychosocial issues identified. She rated her stress level as low. 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 Will continue to montior.    Expected Outcomes Patient will have no psychosocial issues identifed at discharge.    Continue Psychosocial Services  No Follow up required           Psychosocial Re-Evaluation:  Psychosocial Re-Evaluation    [place] Name 04/12/20 1602 05/14/20 1609 06/11/20 1519 07/09/20 1503       Psychosocial Re-Evaluation   Current issues with None Identified None Identified None Identified None Identified    Comments Patient's initial QOL score was 24.69% and her PHQ-9 score was 2 with no psychosocial issues identified. Will continue to monitor. Patient continues to have no psychosocial issues identified. Will continue to monitor. Patient continues to have no psychosocial issues identified. Will continue to monitor. Patient continues to have no psychosocial issues identified. Will continue to  monitor.    Expected Outcomes Patient will have no psychosocial issues identified at discharge. Patient will have no psychosocial issues identified at discharge. Patient will have no psychosocial issues identified at discharge. Patient will have no psychosocial issues identified at discharge.    Interventions Encouraged to attend Cardiac Rehabilitation for the exercise;Stress management education;Relaxation education Encouraged to attend Cardiac Rehabilitation for the exercise;Stress management education;Relaxation education Encouraged to attend Cardiac Rehabilitation for the exercise;Stress management education;Relaxation education Encouraged to attend Cardiac Rehabilitation  for the exercise;Stress management education;Relaxation education    Continue Psychosocial Services  No Follow up required No Follow up required No Follow up required No Follow up required           Psychosocial Discharge (Final Psychosocial Re-Evaluation):  Psychosocial Re-Evaluation - 07/09/20 1503      Psychosocial Re-Evaluation   Current issues with None Identified    Comments Patient continues to have no psychosocial issues identified. Will continue to monitor.    Expected Outcomes Patient will have no psychosocial issues identified at discharge.    Interventions Encouraged to attend Cardiac Rehabilitation for the exercise;Stress management education;Relaxation education    Continue Psychosocial Services  No Follow up required           Vocational Rehabilitation: Provide vocational rehab assistance to qualifying candidates.   Vocational Rehab Evaluation & Intervention:  Vocational Rehab - 04/02/20 0949      Initial Vocational Rehab Evaluation & Intervention   Assessment shows need for Vocational Rehabilitation No      Vocational Rehab Re-Evaulation   Comments Patient has returned to work working full-time.           Education: Education Goals: Education classes will be provided on a weekly basis, covering required topics. Participant will state understanding/return demonstration of topics presented.  Learning Barriers/Preferences:  Learning Barriers/Preferences - 04/02/20 0948      Learning Barriers/Preferences   Learning Barriers None    Learning Preferences Skilled Demonstration;Written Material;Audio;Group Instruction           Education Topics: Hypertension, Hypertension Reduction -Define heart disease and high blood pressure. Discus how high blood pressure affects the body and ways to reduce high blood pressure.   CARDIAC REHAB PHASE II EXERCISE from 07/04/2020 in [place]  Date 04/18/20  Educator DF  Instruction Review Code  2- Demonstrated Understanding      Exercise and Your Heart -Discuss why it is important to exercise, the FITT principles of exercise, normal and abnormal responses to exercise, and how to exercise safely.   CARDIAC REHAB PHASE II EXERCISE from 07/04/2020 in [place]  Date 04/25/20  Educator Etheleen Mayhew  Instruction Review Code 1- Verbalizes Understanding      Angina -Discuss definition of angina, causes of angina, treatment of angina, and how to decrease risk of having angina.   Cardiac Medications -Review what the following cardiac medications are used for, how they affect the body, and side effects that may occur when taking the medications.  Medications include Aspirin, Beta blockers, calcium channel blockers, ACE Inhibitors, angiotensin receptor blockers, diuretics, digoxin, and antihyperlipidemics.   CARDIAC REHAB PHASE II EXERCISE from 07/04/2020 in [place]  Date 05/09/20  Educator DF  Instruction Review Code 1- Verbalizes Understanding      Congestive Heart Failure -Discuss the definition of CHF, how to live with CHF, the signs and symptoms of CHF, and how keep track of weight and sodium intake.   CARDIAC REHAB PHASE II EXERCISE  from 07/04/2020 in [place]  Date 05/16/20  Educator DF  Instruction Review Code 1- Verbalizes Understanding      Heart Disease and Intimacy -Discus the effect sexual activity has on the heart, how changes occur during intimacy as we age, and safety during sexual activity.   Smoking Cessation / COPD -Discuss different methods to quit smoking, the health benefits of quitting smoking, and the definition of COPD.   Nutrition I: Fats -Discuss the types of cholesterol, what cholesterol does to the heart, and how cholesterol levels can be controlled.   Nutrition II: Labels -Discuss the different components of food labels and how to read food label   CARDIAC REHAB PHASE II  EXERCISE from 07/04/2020 in [place]  Date 06/13/20  Educator DF  Instruction Review Code 1- Verbalizes Understanding      Heart Parts/Heart Disease and PAD -Discuss the anatomy of the heart, the pathway of blood circulation through the heart, and these are affected by heart disease.   Stress I: Signs and Symptoms -Discuss the causes of stress, how stress may lead to anxiety and depression, and ways to limit stress.   CARDIAC REHAB PHASE II EXERCISE from 07/04/2020 in [place]  Date 06/27/20  Educator DF  Instruction Review Code 1- Verbalizes Understanding      Stress II: Relaxation -Discuss different types of relaxation techniques to limit stress.   CARDIAC REHAB PHASE II EXERCISE from 07/04/2020 in [place]  Date 07/04/20  Educator DF  Instruction Review Code 2- Demonstrated Understanding      Warning Signs of Stroke / TIA -Discuss definition of a stroke, what the signs and symptoms are of a stroke, and how to identify when someone is having stroke.   CARDIAC REHAB PHASE II EXERCISE from 07/04/2020 in [place]  Date 04/11/20  Educator Etheleen Mayhew  Instruction Review Code 1- Verbalizes Understanding      Knowledge Questionnaire Score:  Knowledge Questionnaire Score - 04/02/20 0949      Knowledge Questionnaire Score   Pre Score 9/24           Core Components/Risk Factors/Patient Goals at Admission:  Personal Goals and Risk Factors at Admission - 04/02/20 0949      Core Components/Risk Factors/Patient Goals on Admission    Weight Management Yes    Intervention Weight Management: Develop a combined nutrition and exercise program designed to reach desired caloric intake, while maintaining appropriate intake of nutrient and fiber, sodium and fats, and appropriate energy expenditure required for the weight goal.;Weight Management: Provide education and appropriate resources  to help participant work on and attain dietary goals.;Weight Management/Obesity: Establish reasonable short term and long term weight goals.;Obesity: Provide education and appropriate resources to help participant work on and attain dietary goals.    Admit Weight 273 lb (123.8 kg)    Goal Weight: Short Term 268 lb (121.6 kg)    Goal Weight: Long Term 263 lb (119.3 kg)    Expected Outcomes Long Term: Adherence to nutrition and physical activity/exercise program aimed toward attainment of established weight goal;Weight Loss: Understanding of general recommendations for a balanced deficit meal plan, which promotes 1-2 lb weight loss per week and includes a negative energy balance of [phone] kcal/d    Personal Goal Other Yes    Personal Goal Lose 10 lbs in program. Learn how to eat healthier and develop an healthy lifestyle overall.    Intervention Patient will participate in CR  3 days/week and supplement with exercise 2 days/week.    Expected Outcomes Patient will meet both program and personal goals.           Core Components/Risk Factors/Patient Goals Review:   Goals and Risk Factor Review    [place] Name 04/12/20 1603 05/14/20 1609 06/11/20 1519 07/09/20 1459       Core Components/Risk Factors/Patient Goals Review   Personal Goals Review Weight Management/Obesity;Other  Lose 10 lbs; Improve eating habits; live a healthier life. Weight Management/Obesity;Other  Lose 10 lbs; improve eating habits; live a healthier life. Weight Management/Obesity;Other  Lose 10 lbs; improve eating habits; live a healthier life. Weight Management/Obesity;Other  Lose 10 lbs; improve eating habits; live a healthier life.    Review Patient is new to the program completing 3 sessions. She is looking forward to meeting her personal goals. Will continue to monitor for progress. Patient has completed 10 sessions gaining 2 lbs since her last 30 day review. She is doing well in the program with some progression. She is  scheduled to have surgery on her 4th right toe to remove a ganglion tumor. She is not sure how long this will take her out of the program. She will resume activities as advised by her surgeon. Will continue to monitor. Patient has completed 12 sessions losing 1 lb since last 30 day review. She has been absent sicne 05/16/20 with surgery on her 4th right toe. She says the toe took longer to heal than she has thought. She was cleared by surgeon 06/01/20. Wound healing as expected and was released to full weight bearing as tolerated in regular shoe.  She says she hopes to regain the progress she has made before the surgery. She was not able to a lot post-op. Will continue to monitor for progress. Patient has completed 22 sessions gaining 5 lbs since last 30 day review. Her attendance has been consistent since returning to the program after surgery on her toe to remove a ganglion cyst. She is set to graduate 07/20/20. She is doing well in the program with progression. Her blood pressure is well controlled. Will continue to monitor for progress.    Expected Outcomes Patient will continue to attend sessions and complete the program meeting both program and personal goals. Patient will continue to attend sessions and complete the program meeting both program and personal goals. Patient will continue to attend sessions and complete the program meeting both program and personal goals. Patient will continue to attend sessions and complete the program meeting both program and personal goals.           Core Components/Risk Factors/Patient Goals at Discharge (Final Review):   Goals and Risk Factor Review - 07/09/20 1459      Core Components/Risk Factors/Patient Goals Review   Personal Goals Review Weight Management/Obesity;Other   Lose 10 lbs; improve eating habits; live a healthier life.   Review Patient has completed 22 sessions gaining 5 lbs since last 30 day review. Her attendance has been consistent since returning  to the program after surgery on her toe to remove a ganglion cyst. She is set to graduate 07/20/20. She is doing well in the program with progression. Her blood pressure is well controlled. Will continue to monitor for progress.    Expected Outcomes Patient will continue to attend sessions and complete the program meeting both program and personal goals.           ITP Comments:   Comments: ITP REVIEW Pt is making  expected progress toward Cardiac Rehab goals after completing 23 sessions. Recommend continued exercise, life style modification, education, and increased stamina and strength.

## 2020-07-11 NOTE — Progress Notes (Signed)
Daily Session Note  Patient Details  Name: [name] MRN: [id number] Date of Birth: [birth year] Referring Provider:     CARDIAC REHAB PHASE II ORIENTATION from 04/02/2020 in [place]  Referring Provider Dr. Angelena Form       Encounter Date: 07/11/2020  Check In:  Session Check In - 07/11/20 0815      Check-In   Supervising physician immediately available to respond to emergencies See telemetry face sheet for immediately available MD    Location [facility]    Staff Present Geanie Cooley, Kermit Balo, RN, BSN    Virtual Visit No    Medication changes reported     No    Fall or balance concerns reported    No    Tobacco Cessation No Change    Warm-up and Cool-down Performed as group-led instruction    Resistance Training Performed Yes    VAD Patient? No    PAD/SET Patient? No      Pain Assessment   Currently in Pain? No/denies    Pain Score 0-No pain    Multiple Pain Sites No           Capillary Blood Glucose: No results found for this or any previous visit (from the past 24 hour(s)).    Social History   Tobacco Use  Smoking Status Never Smoker  Smokeless Tobacco Never Used    Goals Met:  Independence with exercise equipment Exercise tolerated well No report of cardiac concerns or symptoms Strength training completed today  Goals Unmet:  Not Applicable  Comments: check out @ 9:15   Dr. Kathie Dike is Medical Director for [facility] Pulmonary Rehab.

## 2020-07-13 ENCOUNTER — Other Ambulatory Visit: Payer: Self-pay

## 2020-07-13 ENCOUNTER — Encounter (HOSPITAL_COMMUNITY)
Admission: RE | Admit: 2020-07-13 | Discharge: 2020-07-13 | Disposition: A | Discharge status: Home / Self Care | Payer: BC Managed Care – PPO | Source: Ambulatory Visit | Attending: Cardiovascular Disease | Admitting: Cardiovascular Disease

## 2020-07-13 DIAGNOSIS — I213 ST elevation (STEMI) myocardial infarction of unspecified site: Secondary | ICD-10-CM | POA: Diagnosis not present

## 2020-07-13 DIAGNOSIS — Z955 Presence of coronary angioplasty implant and graft: Secondary | ICD-10-CM

## 2020-07-13 NOTE — Progress Notes (Signed)
Daily Session Note  Patient Details  Name: [name] MRN: [id number] Date of Birth: 11/27/1970 Referring Provider:     CARDIAC REHAB PHASE II ORIENTATION from 04/02/2020 in [place]  Referring Provider Dr. Angelena Form       Encounter Date: 07/13/2020  Check In:  Session Check In - 07/13/20 0815      Check-In   Supervising physician immediately available to respond to emergencies See telemetry face sheet for immediately available MD    Location [facility]    Staff Present Geanie Cooley, RN    Virtual Visit No    Medication changes reported     No    Fall or balance concerns reported    No    Tobacco Cessation No Change    Warm-up and Cool-down Performed as group-led instruction    Resistance Training Performed Yes    VAD Patient? No    PAD/SET Patient? No      Pain Assessment   Currently in Pain? No/denies    Pain Score 0-No pain    Multiple Pain Sites No           Capillary Blood Glucose: No results found for this or any previous visit (from the past 24 hour(s)).    Social History   Tobacco Use  Smoking Status Never Smoker  Smokeless Tobacco Never Used    Goals Met:  Independence with exercise equipment Exercise tolerated well No report of cardiac concerns or symptoms Strength training completed today  Goals Unmet:  Not Applicable  Comments: check out @ 9:15   Dr. Kathie Dike is Medical Director for [facility] Pulmonary Rehab.

## 2020-07-16 ENCOUNTER — Encounter (HOSPITAL_COMMUNITY)
Admission: RE | Admit: 2020-07-16 | Discharge: 2020-07-16 | Disposition: A | Discharge status: Home / Self Care | Payer: BC Managed Care – PPO | Source: Ambulatory Visit | Attending: Cardiovascular Disease | Admitting: Cardiovascular Disease

## 2020-07-16 ENCOUNTER — Other Ambulatory Visit: Payer: Self-pay

## 2020-07-16 DIAGNOSIS — I213 ST elevation (STEMI) myocardial infarction of unspecified site: Secondary | ICD-10-CM | POA: Diagnosis not present

## 2020-07-16 DIAGNOSIS — Z955 Presence of coronary angioplasty implant and graft: Secondary | ICD-10-CM | POA: Diagnosis not present

## 2020-07-16 NOTE — Progress Notes (Signed)
Daily Session Note  Patient Details  Name: [name] MRN: [id number] Date of Birth: July 21, 1971 Referring Provider:     CARDIAC REHAB PHASE II ORIENTATION from 04/02/2020 in [place]  Referring Provider Dr. Angelena Form       Encounter Date: 07/16/2020  Check In:  Session Check In - 07/16/20 0829      Check-In   Supervising physician immediately available to respond to emergencies See telemetry face sheet for immediately available MD    Location [facility]    Staff Present Hoy Register, MS, ACSM-CEP, Exercise Physiologist;Debra Wynetta Emery, RN, BSN    Virtual Visit No    Medication changes reported     No    Fall or balance concerns reported    No    Tobacco Cessation No Change    Warm-up and Cool-down Performed as group-led instruction    Resistance Training Performed Yes    VAD Patient? No    PAD/SET Patient? No      Pain Assessment   Currently in Pain? No/denies    Pain Score 0-No pain    Multiple Pain Sites No           Capillary Blood Glucose: No results found for this or any previous visit (from the past 24 hour(s)).    Social History   Tobacco Use  Smoking Status Never Smoker  Smokeless Tobacco Never Used    Goals Met:  Independence with exercise equipment Exercise tolerated well No report of cardiac concerns or symptoms Strength training completed today  Goals Unmet:  Not Applicable  Comments: checkout time is 0915   Dr. Kathie Dike is Medical Director for [facility] Pulmonary Rehab.

## 2020-07-18 ENCOUNTER — Other Ambulatory Visit: Payer: Self-pay

## 2020-07-18 ENCOUNTER — Encounter (HOSPITAL_COMMUNITY)
Admission: RE | Admit: 2020-07-18 | Discharge: 2020-07-18 | Disposition: A | Discharge status: Home / Self Care | Payer: BC Managed Care – PPO | Source: Ambulatory Visit | Attending: Cardiovascular Disease | Admitting: Cardiovascular Disease

## 2020-07-18 VITALS — Ht 64.0 in | Wt 282.4 lb

## 2020-07-18 DIAGNOSIS — I213 ST elevation (STEMI) myocardial infarction of unspecified site: Secondary | ICD-10-CM

## 2020-07-18 DIAGNOSIS — Z955 Presence of coronary angioplasty implant and graft: Secondary | ICD-10-CM | POA: Diagnosis not present

## 2020-07-18 NOTE — Progress Notes (Signed)
Daily Session Note  Patient Details  Name: [name] MRN: [id number] Date of Birth: 1971-09-28 Referring Provider:     CARDIAC REHAB PHASE II ORIENTATION from 04/02/2020 in [place]  Referring Provider Dr. Angelena Form       Encounter Date: 07/18/2020  Check In:  Session Check In - 07/18/20 0850      Check-In   Supervising physician immediately available to respond to emergencies See telemetry face sheet for immediately available MD    Location [facility]    Staff Present Hoy Register, MS, ACSM-CEP, Exercise Physiologist;Debra Wynetta Emery, RN, BSN    Virtual Visit No    Medication changes reported     No    Fall or balance concerns reported    No    Tobacco Cessation No Change    Warm-up and Cool-down Performed as group-led instruction    Resistance Training Performed Yes    VAD Patient? No    PAD/SET Patient? No      Pain Assessment   Currently in Pain? No/denies    Pain Score 0-No pain    Multiple Pain Sites No           Capillary Blood Glucose: No results found for this or any previous visit (from the past 24 hour(s)).    Social History   Tobacco Use  Smoking Status Never Smoker  Smokeless Tobacco Never Used    Goals Met:  Independence with exercise equipment Exercise tolerated well No report of cardiac concerns or symptoms Strength training completed today  Goals Unmet:  Not Applicable  Comments: checkout time is 0915   Dr. Kathie Dike is Medical Director for [facility] Pulmonary Rehab.

## 2020-07-20 ENCOUNTER — Encounter (HOSPITAL_COMMUNITY)
Admission: RE | Admit: 2020-07-20 | Discharge: 2020-07-20 | Disposition: A | Discharge status: Home / Self Care | Payer: BC Managed Care – PPO | Source: Ambulatory Visit | Attending: Cardiovascular Disease | Admitting: Cardiovascular Disease

## 2020-07-20 ENCOUNTER — Other Ambulatory Visit: Payer: Self-pay

## 2020-07-20 DIAGNOSIS — I213 ST elevation (STEMI) myocardial infarction of unspecified site: Secondary | ICD-10-CM

## 2020-07-20 DIAGNOSIS — Z955 Presence of coronary angioplasty implant and graft: Secondary | ICD-10-CM

## 2020-07-20 NOTE — Progress Notes (Signed)
Daily Session Note  Patient Details  Name: [name] MRN: [id number] Date of Birth: 12-22-1970 Referring Provider:     CARDIAC REHAB PHASE II ORIENTATION from 04/02/2020 in [place]  Referring Provider Dr. Angelena Form       Encounter Date: 07/20/2020  Check In:  Session Check In - 07/20/20 0824      Check-In   Supervising physician immediately available to respond to emergencies See telemetry face sheet for immediately available MD    Location [facility]    Staff Present Hoy Register, MS, ACSM-CEP, Exercise Physiologist;Debra Wynetta Emery, RN, BSN    Virtual Visit No    Medication changes reported     No    Fall or balance concerns reported    No    Tobacco Cessation No Change    Warm-up and Cool-down Performed as group-led instruction    Resistance Training Performed Yes    VAD Patient? No    PAD/SET Patient? No      Pain Assessment   Currently in Pain? No/denies    Pain Score 0-No pain    Multiple Pain Sites No           Capillary Blood Glucose: No results found for this or any previous visit (from the past 24 hour(s)).    Social History   Tobacco Use  Smoking Status Never Smoker  Smokeless Tobacco Never Used    Goals Met:  Independence with exercise equipment Exercise tolerated well No report of cardiac concerns or symptoms Strength training completed today  Goals Unmet:  Not Applicable  Comments: checkout time is 0915   Dr. Kathie Dike is Medical Director for [facility] Pulmonary Rehab.

## 2020-07-20 NOTE — Progress Notes (Signed)
Cardiac Individual Treatment Plan  Patient Details  Name: [name] MRN: [id number] Date of Birth: 18-Nov-1970 Referring Provider:     CARDIAC REHAB PHASE II ORIENTATION from 04/02/2020 in [facility]  Referring Provider Dr. Angelena Form       Initial Encounter Date:    CARDIAC REHAB PHASE II ORIENTATION from 04/02/2020 in [facility]  Date 04/02/20      Visit Diagnosis: ST elevation myocardial infarction (STEMI), unspecified artery ([place])  Status post coronary artery stent placement  Patient's Home Medications on Admission:  Current Outpatient Medications:  .  acetaminophen (TYLENOL) 325 MG tablet, Take 325 mg by mouth every 6 (six) hours as needed., Disp: , Rfl:  .  amLODipine (NORVASC) 10 MG tablet, Take 1 tablet by mouth once daily, Disp: 90 tablet, Rfl: 0 .  aspirin 81 MG chewable tablet, Chew 1 tablet (81 mg total) by mouth daily., Disp: 90 tablet, Rfl: 1 .  atorvastatin (LIPITOR) 80 MG tablet, Take 1 tablet (80 mg total) by mouth daily., Disp: 90 tablet, Rfl: 3 .  cloNIDine (CATAPRES) 0.3 MG tablet, TAKE ONE TABLET BY MOUTH AT BEDTIME, Disp: 90 tablet, Rfl: 3 .  labetalol (NORMODYNE) 200 MG tablet, TAKE ONE TABLET BY MOUTH ONCE DAILY EVERY MORNING, Disp: 90 tablet, Rfl: 3 .  lisinopril (ZESTRIL) 10 MG tablet, TAKE 1 TABLET BY MOUTH AT BEDTIME, Disp: 90 tablet, Rfl: 3 .  nitroGLYCERIN (NITROSTAT) 0.4 MG SL tablet, Place 1 tablet (0.4 mg total) under the tongue every 5 (five) minutes as needed., Disp: 25 tablet, Rfl: 3 .  ondansetron (ZOFRAN) 4 MG tablet, Take 1 tablet (4 mg total) by mouth every 8 (eight) hours as needed., Disp: 20 tablet, Rfl: 0 .  ticagrelor (BRILINTA) 90 MG TABS tablet, Take 1 tablet (90 mg total) by mouth 2 (two) times daily., Disp: 180 tablet, Rfl: 3 .  triamterene-hydrochlorothiazide (MAXZIDE) 75-50 MG tablet, Take 1 tablet by mouth daily., Disp: 90 tablet, Rfl: 1  Past Medical History: Past Medical History:    Diagnosis Date  . CAD (coronary artery disease), native coronary artery    s/p DES to LAD 02/01/20  . Chest pain 01/2020  . Facial numbness   . Fibroid   . Fx ankle   . Hypertension   . Obesity     Tobacco Use: Social History   Tobacco Use  Smoking Status Never Smoker  Smokeless Tobacco Never Used    Labs: Recent Review Flowsheet Data    Labs for ITP Cardiac and Pulmonary Rehab Latest Ref Rng & Units 11/05/2017 08/16/2019 08/16/2019 02/02/2020 04/02/2020   Cholestrol 100 - 199 mg/dL 161 - - 162 128   LDLCALC 0 - 99 mg/dL 91 - - 92 54   HDL >39 mg/dL 52 - - 55 60   Trlycerides 0 - 149 mg/dL 89 - - 75 67   Hemoglobin A1c 4.8 - 5.6 % 5.0 - - 5.0 -   PHART 7.35 - 7.45 - 7.387 - - -   PCO2ART 32 - 48 mmHg - 40.1 - - -   HCO3 20.0 - 28.0 mmol/L - 24.5 - - -   TCO2 22 - 32 mmol/L - 26 24 - -   ACIDBASEDEF 0.0 - 2.0 mmol/L - 1.0 - - -   O2SAT % - 99.0 - - -      Capillary Blood Glucose: Lab Results  Component Value Date   GLUCAP 116 (H) 11/06/2009   GLUCAP 112 (H) 08/22/2009  GLUCAP 139 (H) 08/21/2009   GLUCAP 131 (H) 08/21/2009     Exercise Target Goals: Exercise Program Goal: Individual exercise prescription set using results from initial 6 min walk test and THRR while considering  patient's activity barriers and safety.   Exercise Prescription Goal: Starting with aerobic activity 30 plus minutes a day, 3 days per week for initial exercise prescription. Provide home exercise prescription and guidelines that participant acknowledges understanding prior to discharge.  Activity Barriers & Risk Stratification:  Activity Barriers & Cardiac Risk Stratification - 04/02/20 1012      Activity Barriers & Cardiac Risk Stratification   Activity Barriers Deconditioning    Cardiac Risk Stratification High           6 Minute Walk:  6 Minute Walk    Row Name 04/02/20 0956 07/18/20 1135       6 Minute Walk   Phase Initial Discharge    Distance 1200 feet 1300 feet     Distance % Change -- 8.33 %    Distance Feet Change -- 100 ft    Walk Time 6 minutes 6 minutes    # of Rest Breaks 0 0    MPH 2.27 2.46    METS 2.9 3.09    RPE 10 11    VO2 Peak 10.13 10.8    Symptoms No No    Resting HR 87 bpm 98 bpm    Resting BP 110/72 114/72    Resting Oxygen Saturation  98 % 98 %    Exercise Oxygen Saturation  during 6 min walk 99 % 98 %    Max Ex. HR 100 bpm 122 bpm    Max Ex. BP 130/90 124/78    2 Minute Post BP 110/82 108/80           Oxygen Initial Assessment:   Oxygen Re-Evaluation:   Oxygen Discharge (Final Oxygen Re-Evaluation):   Initial Exercise Prescription:  Initial Exercise Prescription - 04/02/20 0900      Date of Initial Exercise RX and Referring Provider   Date 04/02/20    Referring Provider Dr. Angelena Form     Expected Discharge Date 07/03/20      Treadmill   MPH 1.5    Grade 0    Minutes 17    METs 2.14      T5 Nustep   Level 1    SPM 85    Minutes 22      Prescription Details   Frequency (times per week) 3    Duration Progress to 30 minutes of continuous aerobic without signs/symptoms of physical distress      Intensity   THRR 40-80% of Max Heartrate 69-138    Ratings of Perceived Exertion 11-13    Perceived Dyspnea 0-4      Resistance Training   Training Prescription Yes    Weight 2 lbs    Reps 10-15           Perform Capillary Blood Glucose checks as needed.  Exercise Prescription Changes:   Exercise Prescription Changes    Row Name 04/09/20 1100 04/20/20 1452 05/07/20 1200 05/16/20 1200 06/20/20 1200     Response to Exercise   Blood Pressure (Admit) 124/86 110/70 126/82 114/84 112/76   Blood Pressure (Exercise) 136/80 130/74 122/76 130/82 112/76   Blood Pressure (Exit) 124/86 100/70 116/80 118/86 108/80   Heart Rate (Admit) 107 bpm 99 bpm 88 bpm 97 bpm 94 bpm   Heart Rate (Exercise) 127 bpm 122 bpm 128 bpm  118 bpm 113 bpm   Heart Rate (Exit) 113 bpm 107 bpm 97 bpm 104 bpm 86 bpm   Rating of  Perceived Exertion (Exercise) '12 11 11 11 12   ' Duration Continue with 30 min of aerobic exercise without signs/symptoms of physical distress. Continue with 30 min of aerobic exercise without signs/symptoms of physical distress. Continue with 30 min of aerobic exercise without signs/symptoms of physical distress. Continue with 30 min of aerobic exercise without signs/symptoms of physical distress. Continue with 30 min of aerobic exercise without signs/symptoms of physical distress.   Intensity THRR unchanged THRR unchanged THRR unchanged THRR unchanged THRR unchanged     Progression   Progression Continue to progress workloads to maintain intensity without signs/symptoms of physical distress. Continue to progress workloads to maintain intensity without signs/symptoms of physical distress. Continue to progress workloads to maintain intensity without signs/symptoms of physical distress. Continue to progress workloads to maintain intensity without signs/symptoms of physical distress. Continue to progress workloads to maintain intensity without signs/symptoms of physical distress.     Resistance Training   Training Prescription Yes Yes Yes Yes Yes   Weight 2 lbs 2 lbs '3 3 4 ' lbs   Reps 10-15 10-15 10-15 10-15 10-15     Treadmill   MPH 1.5 2.1 2 2.2 2   Grade 0 0 0 0 0   Minutes '17 17 17 17 17   ' METs 2.14 2.6 2.53 2.87 2.53     T5 Nustep   Level '1 1 2 2 2   ' SPM 105 102 62 104 103   Minutes '22 22 22 22 22   ' METs 2.1 2.4 2.3 2.1 2.2     Home Exercise Plan   Plans to continue exercise at -- -- -- [employer] (comment) --   Frequency -- -- -- Add 2 additional days to program exercise sessions. --   Initial Home Exercises Provided -- -- -- 05/16/20 --   [place] Name 07/04/20 1000 07/18/20 1100           Response to Exercise   Blood Pressure (Admit) 100/70 114/72      Blood Pressure (Exercise) 120/88 124/78      Blood Pressure (Exit) 116/84 106/76      Heart Rate (Admit) 93 bpm 106 bpm       Heart Rate (Exercise) 118 bpm 110 bpm      Heart Rate (Exit) 102 bpm 106 bpm      Rating of Perceived Exertion (Exercise) 11 11      Duration Continue with 30 min of aerobic exercise without signs/symptoms of physical distress. Continue with 30 min of aerobic exercise without signs/symptoms of physical distress.      Intensity THRR unchanged THRR unchanged        Progression   Progression Continue to progress workloads to maintain intensity without signs/symptoms of physical distress. Continue to progress workloads to maintain intensity without signs/symptoms of physical distress.        Resistance Training   Training Prescription Yes Yes      Weight 4 4 lbs      Reps 10-15 10-15      Time -- 10 Minutes        Treadmill   MPH 2.2 2.2      Grade 0 0      Minutes 17 17      METs 2.68 2.68        T5 Nustep   Level 2 2  SPM 103 109      Minutes 22 22      METs 2.2 2.2             Exercise Comments:   Exercise Comments    Row Name 05/16/20 1235 07/20/20 0832         Exercise Comments Home exercise reviewed Pt graduates from cardiac rehab today after 27 visits. She states that she will continue exercising at [employer].             Exercise Goals and Review:   Exercise Goals    Row Name 04/02/20 1002 05/14/20 1409 06/11/20 0842 07/09/20 1337       Exercise Goals   Increase Physical Activity Yes Yes Yes Yes    Intervention Provide advice, education, support and counseling about physical activity/exercise needs.;Develop an individualized exercise prescription for aerobic and resistive training based on initial evaluation findings, risk stratification, comorbidities and participant's personal goals. Provide advice, education, support and counseling about physical activity/exercise needs.;Develop an individualized exercise prescription for aerobic and resistive training based on initial evaluation findings, risk stratification, comorbidities and participant's  personal goals. Provide advice, education, support and counseling about physical activity/exercise needs.;Develop an individualized exercise prescription for aerobic and resistive training based on initial evaluation findings, risk stratification, comorbidities and participant's personal goals. Provide advice, education, support and counseling about physical activity/exercise needs.;Develop an individualized exercise prescription for aerobic and resistive training based on initial evaluation findings, risk stratification, comorbidities and participant's personal goals.    Expected Outcomes Short Term: Attend rehab on a regular basis to increase amount of physical activity.;Long Term: Add in home exercise to make exercise part of routine and to increase amount of physical activity.;Long Term: Exercising regularly at least 3-5 days a week. Short Term: Attend rehab on a regular basis to increase amount of physical activity.;Long Term: Add in home exercise to make exercise part of routine and to increase amount of physical activity.;Long Term: Exercising regularly at least 3-5 days a week. Short Term: Attend rehab on a regular basis to increase amount of physical activity.;Long Term: Add in home exercise to make exercise part of routine and to increase amount of physical activity.;Long Term: Exercising regularly at least 3-5 days a week. Short Term: Attend rehab on a regular basis to increase amount of physical activity.;Long Term: Add in home exercise to make exercise part of routine and to increase amount of physical activity.;Long Term: Exercising regularly at least 3-5 days a week.    Increase Strength and Stamina Yes Yes Yes Yes    Intervention Provide advice, education, support and counseling about physical activity/exercise needs.;Develop an individualized exercise prescription for aerobic and resistive training based on initial evaluation findings, risk stratification, comorbidities and participant's personal  goals. Provide advice, education, support and counseling about physical activity/exercise needs.;Develop an individualized exercise prescription for aerobic and resistive training based on initial evaluation findings, risk stratification, comorbidities and participant's personal goals. Provide advice, education, support and counseling about physical activity/exercise needs.;Develop an individualized exercise prescription for aerobic and resistive training based on initial evaluation findings, risk stratification, comorbidities and participant's personal goals. Provide advice, education, support and counseling about physical activity/exercise needs.;Develop an individualized exercise prescription for aerobic and resistive training based on initial evaluation findings, risk stratification, comorbidities and participant's personal goals.    Expected Outcomes Short Term: Increase workloads from initial exercise prescription for resistance, speed, and METs.;Short Term: Perform resistance training exercises routinely during rehab and add in resistance training at home;Long Term:  Improve cardiorespiratory fitness, muscular endurance and strength as measured by increased METs and functional capacity (6MWT) Short Term: Increase workloads from initial exercise prescription for resistance, speed, and METs.;Short Term: Perform resistance training exercises routinely during rehab and add in resistance training at home;Long Term: Improve cardiorespiratory fitness, muscular endurance and strength as measured by increased METs and functional capacity (6MWT) Short Term: Increase workloads from initial exercise prescription for resistance, speed, and METs.;Short Term: Perform resistance training exercises routinely during rehab and add in resistance training at home;Long Term: Improve cardiorespiratory fitness, muscular endurance and strength as measured by increased METs and functional capacity (6MWT) Short Term: Increase workloads  from initial exercise prescription for resistance, speed, and METs.;Short Term: Perform resistance training exercises routinely during rehab and add in resistance training at home;Long Term: Improve cardiorespiratory fitness, muscular endurance and strength as measured by increased METs and functional capacity (6MWT)    Able to understand and use rate of perceived exertion (RPE) scale Yes Yes Yes Yes    Intervention Provide education and explanation on how to use RPE scale Provide education and explanation on how to use RPE scale Provide education and explanation on how to use RPE scale Provide education and explanation on how to use RPE scale    Expected Outcomes Short Term: Able to use RPE daily in rehab to express subjective intensity level;Long Term:  Able to use RPE to guide intensity level when exercising independently Short Term: Able to use RPE daily in rehab to express subjective intensity level;Long Term:  Able to use RPE to guide intensity level when exercising independently Short Term: Able to use RPE daily in rehab to express subjective intensity level;Long Term:  Able to use RPE to guide intensity level when exercising independently Short Term: Able to use RPE daily in rehab to express subjective intensity level;Long Term:  Able to use RPE to guide intensity level when exercising independently    Knowledge and understanding of Target Heart Rate Range (THRR) Yes Yes Yes Yes    Intervention Provide education and explanation of THRR including how the numbers were predicted and where they are located for reference Provide education and explanation of THRR including how the numbers were predicted and where they are located for reference Provide education and explanation of THRR including how the numbers were predicted and where they are located for reference Provide education and explanation of THRR including how the numbers were predicted and where they are located for reference    Expected Outcomes  Short Term: Able to state/look up THRR;Long Term: Able to use THRR to govern intensity when exercising independently;Short Term: Able to use daily as guideline for intensity in rehab Short Term: Able to state/look up THRR;Long Term: Able to use THRR to govern intensity when exercising independently;Short Term: Able to use daily as guideline for intensity in rehab Short Term: Able to state/look up THRR;Long Term: Able to use THRR to govern intensity when exercising independently;Short Term: Able to use daily as guideline for intensity in rehab Short Term: Able to state/look up THRR;Long Term: Able to use THRR to govern intensity when exercising independently;Short Term: Able to use daily as guideline for intensity in rehab    Able to check pulse independently Yes Yes Yes Yes    Intervention Review the importance of being able to check your own pulse for safety during independent exercise;Provide education and demonstration on how to check pulse in carotid and radial arteries. Review the importance of being able to check your own  pulse for safety during independent exercise;Provide education and demonstration on how to check pulse in carotid and radial arteries. Review the importance of being able to check your own pulse for safety during independent exercise;Provide education and demonstration on how to check pulse in carotid and radial arteries. Review the importance of being able to check your own pulse for safety during independent exercise;Provide education and demonstration on how to check pulse in carotid and radial arteries.    Expected Outcomes Short Term: Able to explain why pulse checking is important during independent exercise;Long Term: Able to check pulse independently and accurately Short Term: Able to explain why pulse checking is important during independent exercise;Long Term: Able to check pulse independently and accurately Short Term: Able to explain why pulse checking is important during  independent exercise;Long Term: Able to check pulse independently and accurately Short Term: Able to explain why pulse checking is important during independent exercise;Long Term: Able to check pulse independently and accurately    Understanding of Exercise Prescription Yes Yes Yes Yes    Intervention Provide education, explanation, and written materials on patient's individual exercise prescription Provide education, explanation, and written materials on patient's individual exercise prescription Provide education, explanation, and written materials on patient's individual exercise prescription Provide education, explanation, and written materials on patient's individual exercise prescription    Expected Outcomes Short Term: Able to explain program exercise prescription;Long Term: Able to explain home exercise prescription to exercise independently Short Term: Able to explain program exercise prescription;Long Term: Able to explain home exercise prescription to exercise independently Short Term: Able to explain program exercise prescription;Long Term: Able to explain home exercise prescription to exercise independently Short Term: Able to explain program exercise prescription;Long Term: Able to explain home exercise prescription to exercise independently           Exercise Goals Re-Evaluation :  Exercise Goals Re-Evaluation    Row Name 05/14/20 1409 06/11/20 0842 07/09/20 1337         Exercise Goal Re-Evaluation   Exercise Goals Review Increase Physical Activity;Increase Strength and Stamina;Able to understand and use rate of perceived exertion (RPE) scale;Knowledge and understanding of Target Heart Rate Range (THRR);Able to check pulse independently;Understanding of Exercise Prescription Increase Physical Activity;Increase Strength and Stamina;Able to understand and use rate of perceived exertion (RPE) scale;Knowledge and understanding of Target Heart Rate Range (THRR);Able to check pulse  independently;Understanding of Exercise Prescription Increase Physical Activity;Increase Strength and Stamina;Able to understand and use rate of perceived exertion (RPE) scale;Knowledge and understanding of Target Heart Rate Range (THRR);Able to check pulse independently;Understanding of Exercise Prescription     Comments Pt has attended 9 exercise sessions. She will be having a minor surgery on her toe on 8/6 and it is unclear how much time she will miss due to this. She has been progressing well in the program. She currently exercises at 3.1 METs on the stepper. Will continue to monitor and progress as able. Pt has attended 11 exercise sessions. She missed the past 3 weeks due to a surgery on her toe, and returned to exercise today. Her MET level has decreased to 2.3 METs from the 3.1 METs she was able to do before her surgery. Hopefully she will be able to reach that baseline again. Will continue to monitor and progress as able. Pt has attended 21 exercise sessions. Her attendance has been much better recently. Her MET levels have not increased to her pre surgery levels. I think that this is due to lack of motivation or  lack of focus. She is constantly on her phone while she is on the stepper taking calls for work or texting people for work. She currently exercises at 2.1 METs on the stepper. Will continue to monitor and progress as able.     Expected Outcomes Pt will meet her goals and implement a home exercise program to accompany her exercise in cardiac rehab. Pt will meet her goals and implement a home exercise program to accompany her exercise in cardiac rehab. Pt will meet her goals and implement a home exercise program to accompany her exercise in cardiac rehab.             Discharge Exercise Prescription (Final Exercise Prescription Changes):  Exercise Prescription Changes - 07/18/20 1100      Response to Exercise   Blood Pressure (Admit) 114/72    Blood Pressure (Exercise) 124/78    Blood  Pressure (Exit) 106/76    Heart Rate (Admit) 106 bpm    Heart Rate (Exercise) 110 bpm    Heart Rate (Exit) 106 bpm    Rating of Perceived Exertion (Exercise) 11    Duration Continue with 30 min of aerobic exercise without signs/symptoms of physical distress.    Intensity THRR unchanged      Progression   Progression Continue to progress workloads to maintain intensity without signs/symptoms of physical distress.      Resistance Training   Training Prescription Yes    Weight 4 lbs    Reps 10-15    Time 10 Minutes      Treadmill   MPH 2.2    Grade 0    Minutes 17    METs 2.68      T5 Nustep   Level 2    SPM 109    Minutes 22    METs 2.2           Nutrition:  Target Goals: Understanding of nutrition guidelines, daily intake of sodium <1577m, cholesterol <2023m calories 30% from fat and 7% or less from saturated fats, daily to have 5 or more servings of fruits and vegetables.  Biometrics:  Pre Biometrics - 04/02/20 1003      Pre Biometrics   Height '5\' 4"'  (1.626 m)    Weight 123.8 kg    Waist Circumference 48 inches    Hip Circumference 54 inches    Waist to Hip Ratio 0.89 %    BMI (Calculated) 46.83    Triceps Skinfold 35 mm    % Body Fat 53.7 %    Grip Strength 40 kg    Flexibility 12 in    Single Leg Stand 12.37 seconds           Post Biometrics - 07/18/20 1136       Post  Biometrics   Height '5\' 4"'  (1.626 m)    Weight 128.1 kg    Waist Circumference 50.5 inches    Hip Circumference 56.5 inches    Waist to Hip Ratio 0.89 %    BMI (Calculated) 48.45    Triceps Skinfold 37 mm    % Body Fat 55.9 %    Grip Strength 38.8 kg    Flexibility 16 in    Single Leg Stand 12.22 seconds           Nutrition Therapy Plan and Nutrition Goals:  Nutrition Therapy & Goals - 07/09/20 1459      Personal Nutrition Goals   Comments 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      Intervention Plan   Intervention  Nutrition handout(s) given to patient.           Nutrition Assessments:  Nutrition Assessments - 07/20/20 1210      MEDFICTS Scores   Pre Score 12    Post Score 15    Score Difference 3           Nutrition Goals Re-Evaluation:   Nutrition Goals Discharge (Final Nutrition Goals Re-Evaluation):   Psychosocial: Target Goals: Acknowledge presence or absence of significant depression and/or stress, maximize coping skills, provide positive support system. Participant is able to verbalize types and ability to use techniques and skills needed for reducing stress and depression.  Initial Review & Psychosocial Screening:  Initial Psych Review & Screening - 04/02/20 1025      Initial Review   Current issues with None Identified      Family Dynamics   Good Support System? Yes      Barriers   Psychosocial barriers to participate in program There are no identifiable barriers or psychosocial needs.      Screening Interventions   Interventions Encouraged to exercise;To provide support and resources with identified psychosocial needs;Provide feedback about the scores to participant    Expected Outcomes Short Term goal: Identification and review with participant of any Quality of Life or Depression concerns found by scoring the questionnaire.;Long Term goal: The participant improves quality of Life and PHQ9 Scores as seen by post scores and/or verbalization of changes           Quality of Life Scores:  Quality of Life - 07/20/20 1208      Quality of Life Scores   Health/Function Pre 18 %    Health/Function Post 28.4 %    Health/Function % Change 57.78 %    Socioeconomic Pre 30 %    Socioeconomic Post 30 %    Socioeconomic % Change  0 %    Psych/Spiritual Pre 29.14 %    Psych/Spiritual Post 30 %    Psych/Spiritual % Change 2.95 %    Family Pre 30 %    Family Post 30 %    Family % Change 0 %    GLOBAL Pre 24.69 %    GLOBAL Post 29.31 %    GLOBAL % Change 18.71 %           Scores of 19 and below usually indicate a poorer quality of life in these areas.  A difference of  2-3 points is a clinically meaningful difference.  A difference of 2-3 points in the total score of the Quality of Life Index has been associated with significant improvement in overall quality of life, self-image, physical symptoms, and general health in studies assessing change in quality of life.  PHQ-9: Recent Review Flowsheet Data    Depression screen [facility] 2/9 07/20/2020 04/02/2020 03/20/2020 05/12/2019 07/27/2018   Decreased Interest 0 0 0 0 0   Down, Depressed, Hopeless 0 0 0 0 0   PHQ - 2 Score 0 0 0 0 0   Altered sleeping 0 0 - - -   Tired, decreased energy 0 1 - - -   Change in appetite 0 1 - - -   Feeling bad or failure about yourself  0 0 - - -   Trouble concentrating 0 0 - - -   Moving slowly or fidgety/restless 0 0 - - -   Suicidal thoughts 0 0 - - -   PHQ-9 Score  0 2 - - -   Difficult doing work/chores Not difficult at all Somewhat difficult - - -     Interpretation of Total Score  Total Score Depression Severity:  1-4 = Minimal depression, 5-9 = Mild depression, 10-14 = Moderate depression, 15-19 = Moderately severe depression, 20-27 = Severe depression   Psychosocial Evaluation and Intervention:  Psychosocial Evaluation - 07/20/20 1524      Discharge Psychosocial Assessment & Intervention   Comments Patient has no psychosocial issues identified at discharge. Her exit QOL score improved 18.75% overall improving 57.78% in health. Her PHQ-9 score went from 2 to 0. She has a very positive outlook about her future and is very happy about how she feels and the goals she has met in the program.           Psychosocial Re-Evaluation:  Psychosocial Re-Evaluation    Dooms Name 04/12/20 1602 05/14/20 1609 06/11/20 1519 07/09/20 1503       Psychosocial Re-Evaluation   Current issues with None Identified None Identified None Identified None Identified    Comments Patient's initial  QOL score was 24.69% and her PHQ-9 score was 2 with no psychosocial issues identified. Will continue to monitor. Patient continues to have no psychosocial issues identified. Will continue to monitor. Patient continues to have no psychosocial issues identified. Will continue to monitor. Patient continues to have no psychosocial issues identified. Will continue to monitor.    Expected Outcomes Patient will have no psychosocial issues identified at discharge. Patient will have no psychosocial issues identified at discharge. Patient will have no psychosocial issues identified at discharge. Patient will have no psychosocial issues identified at discharge.    Interventions Encouraged to attend Cardiac Rehabilitation for the exercise;Stress management education;Relaxation education Encouraged to attend Cardiac Rehabilitation for the exercise;Stress management education;Relaxation education Encouraged to attend Cardiac Rehabilitation for the exercise;Stress management education;Relaxation education Encouraged to attend Cardiac Rehabilitation for the exercise;Stress management education;Relaxation education    Continue Psychosocial Services  No Follow up required No Follow up required No Follow up required No Follow up required           Psychosocial Discharge (Final Psychosocial Re-Evaluation):  Psychosocial Re-Evaluation - 07/09/20 1503      Psychosocial Re-Evaluation   Current issues with None Identified    Comments Patient continues to have no psychosocial issues identified. Will continue to monitor.    Expected Outcomes Patient will have no psychosocial issues identified at discharge.    Interventions Encouraged to attend Cardiac Rehabilitation for the exercise;Stress management education;Relaxation education    Continue Psychosocial Services  No Follow up required           Vocational Rehabilitation: Provide vocational rehab assistance to qualifying candidates.   Vocational Rehab Evaluation &  Intervention:  Vocational Rehab - 04/02/20 0949      Initial Vocational Rehab Evaluation & Intervention   Assessment shows need for Vocational Rehabilitation No      Vocational Rehab Re-Evaulation   Comments Patient has returned to work working full-time.           Education: Education Goals: Education classes will be provided on a weekly basis, covering required topics. Participant will state understanding/return demonstration of topics presented.  Learning Barriers/Preferences:  Learning Barriers/Preferences - 04/02/20 0948      Learning Barriers/Preferences   Learning Barriers None    Learning Preferences Skilled Demonstration;Written Material;Audio;Group Instruction           Education Topics: Hypertension, Hypertension Reduction -Define heart disease and high  blood pressure. Discus how high blood pressure affects the body and ways to reduce high blood pressure.   CARDIAC REHAB PHASE II EXERCISE from 07/18/2020 in [place]  Date 07/18/20  Educator DF  Instruction Review Code 2- Demonstrated Understanding      Exercise and Your Heart -Discuss why it is important to exercise, the FITT principles of exercise, normal and abnormal responses to exercise, and how to exercise safely.   CARDIAC REHAB PHASE II EXERCISE from 07/18/2020 in [place]  Date 04/25/20  Educator Etheleen Mayhew  Instruction Review Code 1- Verbalizes Understanding      Angina -Discuss definition of angina, causes of angina, treatment of angina, and how to decrease risk of having angina.   Cardiac Medications -Review what the following cardiac medications are used for, how they affect the body, and side effects that may occur when taking the medications.  Medications include Aspirin, Beta blockers, calcium channel blockers, ACE Inhibitors, angiotensin receptor blockers, diuretics, digoxin, and antihyperlipidemics.   CARDIAC REHAB PHASE II EXERCISE from  07/18/2020 in [place]  Date 05/09/20  Educator DF  Instruction Review Code 1- Verbalizes Understanding      Congestive Heart Failure -Discuss the definition of CHF, how to live with CHF, the signs and symptoms of CHF, and how keep track of weight and sodium intake.   CARDIAC REHAB PHASE II EXERCISE from 07/18/2020 in [place]  Date 05/16/20  Educator DF  Instruction Review Code 1- Verbalizes Understanding      Heart Disease and Intimacy -Discus the effect sexual activity has on the heart, how changes occur during intimacy as we age, and safety during sexual activity.   Smoking Cessation / COPD -Discuss different methods to quit smoking, the health benefits of quitting smoking, and the definition of COPD.   Nutrition I: Fats -Discuss the types of cholesterol, what cholesterol does to the heart, and how cholesterol levels can be controlled.   Nutrition II: Labels -Discuss the different components of food labels and how to read food label   Natural Steps from 07/18/2020 in [place]  Date 06/13/20  Educator DF  Instruction Review Code 1- Verbalizes Understanding      Heart Parts/Heart Disease and PAD -Discuss the anatomy of the heart, the pathway of blood circulation through the heart, and these are affected by heart disease.   Stress I: Signs and Symptoms -Discuss the causes of stress, how stress may lead to anxiety and depression, and ways to limit stress.   CARDIAC REHAB PHASE II EXERCISE from 07/18/2020 in [place]  Date 06/27/20  Educator DF  Instruction Review Code 1- Verbalizes Understanding      Stress II: Relaxation -Discuss different types of relaxation techniques to limit stress.   CARDIAC REHAB PHASE II EXERCISE from 07/18/2020 in [place]  Date 07/04/20  Educator DF  Instruction Review Code 2- Demonstrated  Understanding      Warning Signs of Stroke / TIA -Discuss definition of a stroke, what the signs and symptoms are of a stroke, and how to identify when someone is having stroke.   CARDIAC REHAB PHASE II EXERCISE from 07/18/2020 in [place]  Date 04/11/20  Educator Etheleen Mayhew  Instruction Review Code 1- Verbalizes Understanding      Knowledge Questionnaire Score:  Knowledge Questionnaire Score - 07/20/20 1210      Knowledge Questionnaire Score   Post  Score 23/24           Core Components/Risk Factors/Patient Goals at Admission:  Personal Goals and Risk Factors at Admission - 04/02/20 0949      Core Components/Risk Factors/Patient Goals on Admission    Weight Management Yes    Intervention Weight Management: Develop a combined nutrition and exercise program designed to reach desired caloric intake, while maintaining appropriate intake of nutrient and fiber, sodium and fats, and appropriate energy expenditure required for the weight goal.;Weight Management: Provide education and appropriate resources to help participant work on and attain dietary goals.;Weight Management/Obesity: Establish reasonable short term and long term weight goals.;Obesity: Provide education and appropriate resources to help participant work on and attain dietary goals.    Admit Weight 273 lb (123.8 kg)    Goal Weight: Short Term 268 lb (121.6 kg)    Goal Weight: Long Term 263 lb (119.3 kg)    Expected Outcomes Long Term: Adherence to nutrition and physical activity/exercise program aimed toward attainment of established weight goal;Weight Loss: Understanding of general recommendations for a balanced deficit meal plan, which promotes 1-2 lb weight loss per week and includes a negative energy balance of [phone] kcal/d    Personal Goal Other Yes    Personal Goal Lose 10 lbs in program. Learn how to eat healthier and develop an healthy lifestyle overall.    Intervention Patient will  participate in CR 3 days/week and supplement with exercise 2 days/week.    Expected Outcomes Patient will meet both program and personal goals.           Core Components/Risk Factors/Patient Goals Review:   Goals and Risk Factor Review    Row Name 04/12/20 1603 05/14/20 1609 06/11/20 1519 07/09/20 1459 07/20/20 1526     Core Components/Risk Factors/Patient Goals Review   Personal Goals Review Weight Management/Obesity;Other  Lose 10 lbs; Improve eating habits; live a healthier life. Weight Management/Obesity;Other  Lose 10 lbs; improve eating habits; live a healthier life. Weight Management/Obesity;Other  Lose 10 lbs; improve eating habits; live a healthier life. Weight Management/Obesity;Other  Lose 10 lbs; improve eating habits; live a healthier life. Weight Management/Obesity;Other  Lose 10 lbs; improve eating habits; live a healthier life.   Review Patient is new to the program completing 3 sessions. She is looking forward to meeting her personal goals. Will continue to monitor for progress. Patient has completed 10 sessions gaining 2 lbs since her last 30 day review. She is doing well in the program with some progression. 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 She is not sure how long this will take her out of the program. She will resume activities as advised by her surgeon. Will continue to monitor. Patient has completed 12 sessions losing 1 lb since last 30 day review. She has been absent sicne 05/16/20 with surgery on her 4th right toe. She says the toe took longer to heal than she has thought. She was cleared by surgeon 06/01/20. Wound healing as expected and was released to full weight bearing as tolerated in regular shoe.  She says she hopes to regain the progress she has made before the surgery. She was not able to a lot post-op. Will continue to monitor for progress. Patient has completed 22 sessions gaining 5 lbs since last 30 day review. Her  attendance has been consistent since returning to the program after surgery on her toe to remove a ganglion cyst. She is set to  graduate 07/20/20. She is doing well in the program with progression. Her blood pressure is well controlled. Will continue to monitor for progress. Patient completed the program with 27 sessions. She did very well in the program. Her exit measurements in flexibility and grip strength. Her exit walk test improved by 8.33%. She gained 7 lbs in the program with increased hip and waist measurments. She says she still needs to work on her diet but not knows how to make heathier choices. She says she feels a lot better, she feels stronger and has more energy. She is a member of [employer] and plans to continue exercising for life. She met program goals.  CR will f/u.   Expected Outcomes Patient will continue to attend sessions and complete the program meeting both program and personal goals. Patient will continue to attend sessions and complete the program meeting both program and personal goals. Patient will continue to attend sessions and complete the program meeting both program and personal goals. Patient will continue to attend sessions and complete the program meeting both program and personal goals. Patient will continue to exercise at [facility] [employer] and continue to meet her personal goals.          Core Components/Risk Factors/Patient Goals at Discharge (Final Review):   Goals and Risk Factor Review - 07/20/20 1526      Core Components/Risk Factors/Patient Goals Review   Personal Goals Review Weight Management/Obesity;Other   Lose 10 lbs; improve eating habits; live a healthier life.   Review Patient completed the program with 27 sessions. She did very well in the program. Her exit measurements in flexibility and grip strength. Her exit walk test improved by 8.33%. She gained 7 lbs in the program with increased hip and waist measurments. She says she still needs to work on  her diet but not knows how to make heathier choices. She says she feels a lot better, she feels stronger and has more energy. She is a member of [facility] [employer] and plans to continue exercising for life. She met program goals.  CR will f/u.    Expected Outcomes Patient will continue to exercise at [facility] and continue to meet her personal goals.           ITP Comments:   Comments: Patient graduated from [place] today on 07/20/20 after completing 27 sessions. She achieved LTG of 30 minutes of aerobic exercise at Max Met level of 3.8. All patients vitals are WNL.  Discharge instruction has been reviewed in detail and patient stated an understanding of material given. Patient plans to exercise at [facility]. Cardiac Rehab staff will make a f/u call. Patient had no complaints of any abnormal S/S or pain on their exit visit.

## 2020-07-31 ENCOUNTER — Other Ambulatory Visit: Payer: Self-pay | Admitting: Family Medicine

## 2020-07-31 ENCOUNTER — Encounter: Payer: Self-pay | Admitting: Family Medicine

## 2020-07-31 ENCOUNTER — Other Ambulatory Visit: Payer: Self-pay | Admitting: *Deleted

## 2020-07-31 DIAGNOSIS — I10 Essential (primary) hypertension: Secondary | ICD-10-CM

## 2020-07-31 MED ORDER — TRIAMTERENE-HCTZ 75-50 MG PO TABS: 1.0000 | ORAL_TABLET | Freq: Every day | ORAL | 1 refills | Status: DC

## 2020-08-15 ENCOUNTER — Telehealth (INDEPENDENT_AMBULATORY_CARE_PROVIDER_SITE_OTHER): Payer: BC Managed Care – PPO | Admitting: Family Medicine

## 2020-08-15 ENCOUNTER — Encounter: Payer: Self-pay | Admitting: Family Medicine

## 2020-08-15 ENCOUNTER — Other Ambulatory Visit: Payer: Self-pay

## 2020-08-15 VITALS — BP 110/75 | Ht 63.0 in | Wt 282.0 lb

## 2020-08-15 DIAGNOSIS — H9190 Unspecified hearing loss, unspecified ear: Secondary | ICD-10-CM | POA: Diagnosis not present

## 2020-08-15 DIAGNOSIS — I10 Essential (primary) hypertension: Secondary | ICD-10-CM | POA: Diagnosis not present

## 2020-08-15 DIAGNOSIS — H9192 Unspecified hearing loss, left ear: Secondary | ICD-10-CM

## 2020-08-15 DIAGNOSIS — I214 Non-ST elevation (NSTEMI) myocardial infarction: Secondary | ICD-10-CM

## 2020-08-15 DIAGNOSIS — E785 Hyperlipidemia, unspecified: Secondary | ICD-10-CM

## 2020-08-15 NOTE — Progress Notes (Signed)
Virtual Visit via  Video Note  I connected with [name] on 08/15/20 at  9:20 AM EDT by video and verified that I am speaking with the correct person using two identifiers.  Location: Patient: home Provider: work   I discussed the limitations, risks, security and privacy concerns of performing an evaluation and management service by video and the availability of in person appointments. I also discussed with the patient that there may be a patient responsible charge related to this service. The patient expressed understanding and agreed to proceed.   Is doing well. Completed cardiac rehab Oct 8 from June 26, and started at gym for 3 days / week Denies recent fever or chills. Denies sinus pressure, nasal congestion, ear pain or sore throat. Denies chest congestion, productive cough or wheezing. Denies chest pains, palpitations and leg swelling, stil has some exertional fatigue Denies abdominal pain, nausea, vomiting,diarrhea or constipation.   Denies dysuria, frequency, hesitancy or incontinence. Denies joint pain, swelling and limitation in mobility. Denies headaches, seizures, numbness, or tingling. Denies depression, anxiety or insomnia. Denies skin break down or rash.       Observations/Objective: BP 110/75 Comment: per patient  Ht 5\' 3"  (1.6 m)   Wt 282 lb (127.9 kg)   BMI 49.95 kg/m  Good communication with no confusion and intact memory. Alert and oriented x 3 No signs of respiratory distress during speech    Assessment and Plan:  Malignant hypertension Controlled, no change in medication DASH diet and commitment to daily physical activity for a minimum of 30 minutes discussed and encouraged, as a part of hypertension management. The importance of attaining a healthy weight is also discussed.  BP/Weight 08/15/2020 07/18/2020 07/04/2020 06/29/2020 06/20/2020 05/16/2020 7/82/4235  Systolic BP 361 - - 443 - - -  Diastolic BP 75 - - 70 - - -  Wt. (Lbs) 282 282.41 280.87  280 280.43 277.78 277.78  BMI 49.95 48.48 48.21 48.06 48.13 47.68 47.68       Morbid obesity  Patient re-educated about  the importance of commitment to a  minimum of 150 minutes of exercise per week as able.  The importance of healthy food choices with portion control discussed, as well as eating regularly and within a 12 hour window most days. The need to choose "clean , green" food 50 to 75% of the time is discussed, as well as to make water the primary drink and set a goal of 64 ounces water daily.    Weight /BMI 08/15/2020 07/18/2020 07/04/2020  WEIGHT 282 lb 282 lb 6.6 oz 280 lb 13.9 oz  HEIGHT 5\' 3"  5\' 4"  -  BMI 49.95 kg/m2 48.48 kg/m2 48.21 kg/m2      Left ear hearing loss Refer to ENT for evaluation for hearing aid  Hyperlipidemia LDL goal <70 Hyperlipidemia:Low fat diet discussed and encouraged.   Lipid Panel  Lab Results  Component Value Date   CHOL 128 04/02/2020   HDL 60 04/02/2020   LDLCALC 54 04/02/2020   TRIG 67 04/02/2020   CHOLHDL 2.1 04/02/2020       NSTEMI (non-ST elevated myocardial infarction) [facility]) Completed cardiac rehab and now starting a regular exercise routine   Follow Up Instructions:    I discussed the assessment and treatment plan with the patient. The patient was provided an opportunity to ask questions and all were answered. The patient agreed with the plan and demonstrated an understanding of the instructions.   The patient was advised to call back or  seek an in-person evaluation if the symptoms worsen or if the condition fails to improve as anticipated.  I provided 20 minutes of non-face-to-face time during this encounter.   Tula Nakayama, MD

## 2020-08-15 NOTE — Patient Instructions (Signed)
Please call patient in for flu vaccine in next 1 to 2 weeks  Appointment in oiffice with Dr Simpson in January, call if you need me sooner. Morning appointment per pt request  Pt referred to ENT for hearing loss, please follow through  Please schedule January mammogram , morning appointment  Needs fasting lipid,cmp and EGFr, cBC anfd hepatitis screen 5 days before January visit, please order  It is important that you exercise regularly at least 30 minutes 5 times a week. If you develop chest pain, have severe difficulty breathing, or feel very tired, stop exercising immediately and seek medical attention  Think about what you will eat, plan ahead. Choose " clean, green, fresh or frozen" over canned, processed or packaged foods which are more sugary, salty and fatty. 70 to 75% of food eaten should be vegetables and fruit. Three meals at set times with snacks allowed between meals, but they must be fruit or vegetables. Aim to eat over a 12 hour period , example 7 am to 7 pm, and STOP after  your last meal of the day. Drink water,generally about 64 ounces per day, no other drink is as healthy. Fruit juice is best enjoyed in a healthy way, by EATING the fruit. Thanks for choosing [facility], we consider it a privelige to serve you.   

## 2020-08-16 ENCOUNTER — Encounter: Payer: Self-pay | Admitting: Family Medicine

## 2020-08-16 NOTE — Assessment & Plan Note (Addendum)
Completed cardiac rehab and now starting a regular exercise routine, committed to 1 year of DAPT

## 2020-08-16 NOTE — Assessment & Plan Note (Signed)
Controlled, no change in medication DASH diet and commitment to daily physical activity for a minimum of 30 minutes discussed and encouraged, as a part of hypertension management. The importance of attaining a healthy weight is also discussed.  BP/Weight 08/15/2020 07/18/2020 07/04/2020 06/29/2020 06/20/2020 05/16/2020 0/07/9322  Systolic BP 557 - - 322 - - -  Diastolic BP 75 - - 70 - - -  Wt. (Lbs) 282 282.41 280.87 280 280.43 277.78 277.78  BMI 49.95 48.48 48.21 48.06 48.13 47.68 47.68

## 2020-08-16 NOTE — Assessment & Plan Note (Signed)
Hyperlipidemia:Low fat diet discussed and encouraged.   Lipid Panel  Lab Results  Component Value Date   CHOL 128 04/02/2020   HDL 60 04/02/2020   LDLCALC 54 04/02/2020   TRIG 67 04/02/2020   CHOLHDL 2.1 04/02/2020

## 2020-08-16 NOTE — Assessment & Plan Note (Signed)
  Patient re-educated about  the importance of commitment to a  minimum of 150 minutes of exercise per week as able.  The importance of healthy food choices with portion control discussed, as well as eating regularly and within a 12 hour window most days. The need to choose "clean , green" food 50 to 75% of the time is discussed, as well as to make water the primary drink and set a goal of 64 ounces water daily.    Weight /BMI 08/15/2020 07/18/2020 07/04/2020  WEIGHT 282 lb 282 lb 6.6 oz 280 lb 13.9 oz  HEIGHT 5\' 3"  5\' 4"  -  BMI 49.95 kg/m2 48.48 kg/m2 48.21 kg/m2

## 2020-08-16 NOTE — Assessment & Plan Note (Signed)
Refer to ENT for evaluation for hearing aid

## 2020-08-21 ENCOUNTER — Ambulatory Visit: Payer: BC Managed Care – PPO | Admitting: Family Medicine

## 2020-09-04 DIAGNOSIS — D333 Benign neoplasm of cranial nerves: Secondary | ICD-10-CM | POA: Diagnosis not present

## 2020-09-04 DIAGNOSIS — H9042 Sensorineural hearing loss, unilateral, left ear, with unrestricted hearing on the contralateral side: Secondary | ICD-10-CM | POA: Diagnosis not present

## 2020-09-19 ENCOUNTER — Other Ambulatory Visit: Payer: Self-pay | Admitting: *Deleted

## 2020-09-19 DIAGNOSIS — Z1231 Encounter for screening mammogram for malignant neoplasm of breast: Secondary | ICD-10-CM

## 2020-09-19 DIAGNOSIS — E785 Hyperlipidemia, unspecified: Secondary | ICD-10-CM

## 2020-09-19 DIAGNOSIS — Z1159 Encounter for screening for other viral diseases: Secondary | ICD-10-CM

## 2020-09-19 DIAGNOSIS — I10 Essential (primary) hypertension: Secondary | ICD-10-CM

## 2020-09-19 DIAGNOSIS — E559 Vitamin D deficiency, unspecified: Secondary | ICD-10-CM

## 2020-10-08 ENCOUNTER — Other Ambulatory Visit (HOSPITAL_COMMUNITY): Payer: Self-pay | Admitting: Neurological Surgery

## 2020-10-08 ENCOUNTER — Other Ambulatory Visit: Payer: Self-pay | Admitting: Neurological Surgery

## 2020-10-08 DIAGNOSIS — D333 Benign neoplasm of cranial nerves: Secondary | ICD-10-CM

## 2020-10-11 DIAGNOSIS — H9042 Sensorineural hearing loss, unilateral, left ear, with unrestricted hearing on the contralateral side: Secondary | ICD-10-CM | POA: Diagnosis not present

## 2020-10-18 ENCOUNTER — Ambulatory Visit: Payer: BC Managed Care – PPO | Admitting: Family Medicine

## 2020-10-18 ENCOUNTER — Encounter: Payer: Self-pay | Admitting: Family Medicine

## 2020-10-18 ENCOUNTER — Other Ambulatory Visit: Payer: Self-pay

## 2020-10-18 VITALS — BP 125/81 | HR 89 | Resp 15 | Ht 63.0 in | Wt 284.0 lb

## 2020-10-18 DIAGNOSIS — Z23 Encounter for immunization: Secondary | ICD-10-CM | POA: Diagnosis not present

## 2020-10-18 DIAGNOSIS — Z1159 Encounter for screening for other viral diseases: Secondary | ICD-10-CM

## 2020-10-18 DIAGNOSIS — E785 Hyperlipidemia, unspecified: Secondary | ICD-10-CM | POA: Diagnosis not present

## 2020-10-18 DIAGNOSIS — I10 Essential (primary) hypertension: Secondary | ICD-10-CM | POA: Diagnosis not present

## 2020-10-18 DIAGNOSIS — Z1211 Encounter for screening for malignant neoplasm of colon: Secondary | ICD-10-CM

## 2020-10-18 DIAGNOSIS — Z1231 Encounter for screening mammogram for malignant neoplasm of breast: Secondary | ICD-10-CM

## 2020-10-18 DIAGNOSIS — E559 Vitamin D deficiency, unspecified: Secondary | ICD-10-CM | POA: Diagnosis not present

## 2020-10-18 DIAGNOSIS — H9192 Unspecified hearing loss, left ear: Secondary | ICD-10-CM

## 2020-10-18 DIAGNOSIS — M549 Dorsalgia, unspecified: Secondary | ICD-10-CM

## 2020-10-18 NOTE — Patient Instructions (Addendum)
Annual exam in office  with MD with pap in 4 months call if you need me sooner  Please schedule mammogram Monday, Tuesday Friday, earliest appt in the morning please  Flu vaccine today  Fasting labs ordered in Dec, add TSh, vit D and CBC  Today  For upper back pain, use tylenol   It is important that you exercise regularly at least 30 minutes 5 times a week. If you develop chest pain, have severe difficulty breathing, or feel very tired, stop exercising immediately and seek medical attention   Think about what you will eat, plan ahead. Choose " clean, green, fresh or frozen" over canned, processed or packaged foods which are more sugary, salty and fatty. 70 to 75% of food eaten should be vegetables and fruit. Three meals at set times with snacks allowed between meals, but they must be fruit or vegetables. Aim to eat over a 12 hour period , example 7 am to 7 pm, and STOP after  your last meal of the day. Drink water,generally about 64 ounces per day, no other drink is as healthy. Fruit juice is best enjoyed in a healthy way, by EATING the fruit.    Calorie Counting for Weight Loss Calories are units of energy. Your body needs a certain amount of calories from food to keep you going throughout the day. When you eat more calories than your body needs, your body stores the extra calories as fat. When you eat fewer calories than your body needs, your body burns fat to get the energy it needs. Calorie counting means keeping track of how many calories you eat and drink each day. Calorie counting can be helpful if you need to lose weight. If you make sure to eat fewer calories than your body needs, you should lose weight. Ask your health care provider what a healthy weight is for you. For calorie counting to work, you will need to eat the right number of calories in a day in order to lose a healthy amount of weight per week. A dietitian can help you determine how many calories you need in a day and  will give you suggestions on how to reach your calorie goal.  A healthy amount of weight to lose per week is usually 1-2 lb (0.5-0.9 kg). This usually means that your daily calorie intake should be reduced by 500-750 calories.  Eating 1,200 - 1,500 calories per day can help most women lose weight.  Eating 1,500 - 1,800 calories per day can help most men lose weight. What is my plan? My goal is to have __________ calories per day. If I have this many calories per day, I should lose around __________ pounds per week. What do I need to know about calorie counting? In order to meet your daily calorie goal, you will need to:  Find out how many calories are in each food you would like to eat. Try to do this before you eat.  Decide how much of the food you plan to eat.  Write down what you ate and how many calories it had. Doing this is called keeping a food log. To successfully lose weight, it is important to balance calorie counting with a healthy lifestyle that includes regular activity. Aim for 150 minutes of moderate exercise (such as walking) or 75 minutes of vigorous exercise (such as running) each week. Where do I find calorie information?  The number of calories in a food can be found on a Nutrition Facts  label. If a food does not have a Nutrition Facts label, try to look up the calories online or ask your dietitian for help. Remember that calories are listed per serving. If you choose to have more than one serving of a food, you will have to multiply the calories per serving by the amount of servings you plan to eat. For example, the label on a package of bread might say that a serving size is 1 slice and that there are 90 calories in a serving. If you eat 1 slice, you will have eaten 90 calories. If you eat 2 slices, you will have eaten 180 calories. How do I keep a food log? Immediately after each meal, record the following information in your food log:  What you ate. Don't forget to  include toppings, sauces, and other extras on the food.  How much you ate. This can be measured in cups, ounces, or number of items.  How many calories each food and drink had.  The total number of calories in the meal. Keep your food log near you, such as in a small notebook in your pocket, or use a mobile app or website. Some programs will calculate calories for you and show you how many calories you have left for the day to meet your goal. What are some calorie counting tips?   Use your calories on foods and drinks that will fill you up and not leave you hungry: ? Some examples of foods that fill you up are nuts and nut butters, vegetables, lean proteins, and high-fiber foods like whole grains. High-fiber foods are foods with more than 5 g fiber per serving. ? Drinks such as sodas, specialty coffee drinks, alcohol, and juices have a lot of calories, yet do not fill you up.  Eat nutritious foods and avoid empty calories. Empty calories are calories you get from foods or beverages that do not have many vitamins or protein, such as candy, sweets, and soda. It is better to have a nutritious high-calorie food (such as an avocado) than a food with few nutrients (such as a bag of chips).  Know how many calories are in the foods you eat most often. This will help you calculate calorie counts faster.  Pay attention to calories in drinks. Low-calorie drinks include water and unsweetened drinks.  Pay attention to nutrition labels for "low fat" or "fat free" foods. These foods sometimes have the same amount of calories or more calories than the full fat versions. They also often have added sugar, starch, or salt, to make up for flavor that was removed with the fat.  Find a way of tracking calories that works for you. Get creative. Try different apps or programs if writing down calories does not work for you. What are some portion control tips?  Know how many calories are in a serving. This will help  you know how many servings of a certain food you can have.  Use a measuring cup to measure serving sizes. You could also try weighing out portions on a kitchen scale. With time, you will be able to estimate serving sizes for some foods.  Take some time to put servings of different foods on your favorite plates, bowls, and cups so you know what a serving looks like.  Try not to eat straight from a bag or box. Doing this can lead to overeating. Put the amount you would like to eat in a cup or on a plate to make sure  you are eating the right portion.  Use smaller plates, glasses, and bowls to prevent overeating.  Try not to multitask (for example, watch TV or use your computer) while eating. If it is time to eat, sit down at a table and enjoy your food. This will help you to know when you are full. It will also help you to be aware of what you are eating and how much you are eating. What are tips for following this plan? Reading food labels  Check the calorie count compared to the serving size. The serving size may be smaller than what you are used to eating.  Check the source of the calories. Make sure the food you are eating is high in vitamins and protein and low in saturated and trans fats. Shopping  Read nutrition labels while you shop. This will help you make healthy decisions before you decide to purchase your food.  Make a grocery list and stick to it. Cooking  Try to cook your favorite foods in a healthier way. For example, try baking instead of frying.  Use low-fat dairy products. Meal planning  Use more fruits and vegetables. Half of your plate should be fruits and vegetables.  Include lean proteins like poultry and fish. How do I count calories when eating out?  Ask for smaller portion sizes.  Consider sharing an entree and sides instead of getting your own entree.  If you get your own entree, eat only half. Ask for a box at the beginning of your meal and put the rest  of your entree in it so you are not tempted to eat it.  If calories are listed on the menu, choose the lower calorie options.  Choose dishes that include vegetables, fruits, whole grains, low-fat dairy products, and lean protein.  Choose items that are boiled, broiled, grilled, or steamed. Stay away from items that are buttered, battered, fried, or served with cream sauce. Items labeled "crispy" are usually fried, unless stated otherwise.  Choose water, low-fat milk, unsweetened iced tea, or other drinks without added sugar. If you want an alcoholic beverage, choose a lower calorie option such as a glass of wine or light beer.  Ask for dressings, sauces, and syrups on the side. These are usually high in calories, so you should limit the amount you eat.  If you want a salad, choose a garden salad and ask for grilled meats. Avoid extra toppings like bacon, cheese, or fried items. Ask for the dressing on the side, or ask for olive oil and vinegar or lemon to use as dressing.  Estimate how many servings of a food you are given. For example, a serving of cooked rice is  cup or about the size of half a baseball. Knowing serving sizes will help you be aware of how much food you are eating at restaurants. The list below tells you how big or small some common portion sizes are based on everyday objects: ? 1 oz-4 stacked dice. ? 3 oz-1 deck of cards. ? 1 tsp-1 die. ? 1 Tbsp- a ping-pong ball. ? 2 Tbsp-1 ping-pong ball. ?  cup- baseball. ? 1 cup-1 baseball. Summary  Calorie counting means keeping track of how many calories you eat and drink each day. If you eat fewer calories than your body needs, you should lose weight.  A healthy amount of weight to lose per week is usually 1-2 lb (0.5-0.9 kg). This usually means reducing your daily calorie intake by 500-750 calories.  The  number of calories in a food can be found on a Nutrition Facts label. If a food does not have a Nutrition Facts label, try  to look up the calories online or ask your dietitian for help.  Use your calories on foods and drinks that will fill you up, and not on foods and drinks that will leave you hungry.  Use smaller plates, glasses, and bowls to prevent overeating. This information is not intended to replace advice given to you by your health care provider. Make sure you discuss any questions you have with your health care provider. Document Revised: 06/18/2018 Document Reviewed: 08/29/2016 Elsevier Patient Education  [employer].

## 2020-10-18 NOTE — Progress Notes (Signed)
[name]     MRN: [id number]      DOB: [birth year]   HPI [name] is here for follow up and re-evaluation of chronic medical conditions, medication management and review of any available recent lab and radiology data.  Preventive health is updated, specifically  Cancer screening and Immunization.   Questions or concerns regarding consultations or procedures which the PT has had in the interim are  addressed. The PT denies any adverse reactions to current medications since the last visit.  There are no new concerns.  There are no specific complaints   ROS Denies recent fever or chills. Denies sinus pressure, nasal congestion, ear pain or sore throat. Denies chest congestion, productive cough or wheezing. Denies chest pains, palpitations and leg swelling Denies abdominal pain, nausea, vomiting,diarrhea or constipation.   Denies dysuria, frequency, hesitancy or incontinence. 3 day h/o upper back pain, non radiaiting, no new lower extremity weakness or numbness, no new incontinence. Denies headaches, seizures, numbness, or tingling. Denies depression, anxiety or insomnia. Denies skin break down or rash.   PE  BP 125/81   Pulse 89   Resp 15   Ht 5\' 3"  (1.6 m)   Wt 284 lb (128.8 kg)   SpO2 95%   BMI 50.31 kg/m   Patient alert and oriented and in no cardiopulmonary distress.  HEENT: No facial asymmetry, EOMI,     Neck supple .  Chest: Clear to auscultation bilaterally.  CVS: S1, S2 no murmurs, no S3.Regular rate.  ABD: Soft non tender.   Ext: No edema  MS: Adequate ROM spine, shoulders, hips and knees.  Skin: Intact, no ulcerations or rash noted.  Psych: Good eye contact, normal affect. Memory intact not anxious or depressed appearing.  CNS: CN 2-12 intact, power,  normal throughout.no focal deficits noted.   Assessment & Plan Malignant hypertension Controlled, no change in medication DASH diet and commitment to daily physical activity for a minimum of 30  minutes discussed and encouraged, as a part of hypertension management. The importance of attaining a healthy weight is also discussed.  BP/Weight 10/18/2020 08/15/2020 07/18/2020 07/04/2020 06/29/2020 06/20/2020 05/16/2020  Systolic BP 125 110 - - 114 - -  Diastolic BP 81 75 - - 70 - -  Wt. (Lbs) 284 282 282.41 280.87 280 280.43 277.78  BMI 50.31 49.95 48.48 48.21 48.06 48.13 47.68       Hyperlipidemia LDL goal <70 Hyperlipidemia:Low fat diet discussed and encouraged.   Lipid Panel  Lab Results  Component Value Date   CHOL 128 10/18/2020   HDL 56 10/18/2020   LDLCALC 60 10/18/2020   TRIG 57 10/18/2020   CHOLHDL 2.3 10/18/2020   Controlled, no change in medication     Left ear hearing loss fittted with hearing aid which is very beneficial  Morbid obesity  Patient re-educated about  the importance of commitment to a  minimum of 150 minutes of exercise per week as able.  The importance of healthy food choices with portion control discussed, as well as eating regularly and within a 12 hour window most days. The need to choose "clean , green" food 50 to 75% of the time is discussed, as well as to make water the primary drink and set a goal of 64 ounces water daily.    Weight /BMI 10/18/2020 08/15/2020 07/18/2020  WEIGHT 284 lb 282 lb 282 lb 6.6 oz  HEIGHT 5\' 3"  5\' 3"  5\' 4"   BMI 50.31 kg/m2 49.95 kg/m2 48.48 kg/m2  Upper back pain Short course of tylenol recommended

## 2020-10-19 LAB — LIPID PANEL
Chol/HDL Ratio: 2.3 ratio (ref 0.0–4.4)
Cholesterol, Total: 128 mg/dL (ref 100–199)
HDL: 56 mg/dL (ref >39)
LDL Chol Calc (NIH): 60 mg/dL (ref 0–99)
Triglycerides: 57 mg/dL (ref 0–149)
VLDL Cholesterol Cal: 12 mg/dL (ref 5–40)

## 2020-10-19 LAB — CMP14+EGFR
ALT: 37 IU/L — ABNORMAL HIGH (ref 0–32)
AST: 40 IU/L (ref 0–40)
Albumin/Globulin Ratio: 1.5 (ref 1.2–2.2)
Albumin: 4 g/dL (ref 3.8–4.8)
Alkaline Phosphatase: 185 IU/L — ABNORMAL HIGH (ref 44–121)
BUN/Creatinine Ratio: 15 (ref 9–23)
BUN: 12 mg/dL (ref 6–24)
Bilirubin Total: 1.4 mg/dL — ABNORMAL HIGH (ref 0.0–1.2)
CO2: 20 mmol/L (ref 20–29)
Calcium: 8.9 mg/dL (ref 8.7–10.2)
Chloride: 107 mmol/L — ABNORMAL HIGH (ref 96–106)
Creatinine, Ser: 0.79 mg/dL (ref 0.57–1.00)
GFR calc Af Amer: 102 mL/min/{1.73_m2} (ref >59)
GFR calc non Af Amer: 89 mL/min/{1.73_m2} (ref >59)
Globulin, Total: 2.7 g/dL (ref 1.5–4.5)
Glucose: 98 mg/dL (ref 65–99)
Potassium: 3.7 mmol/L (ref 3.5–5.2)
Sodium: 142 mmol/L (ref 134–144)
Total Protein: 6.7 g/dL (ref 6.0–8.5)

## 2020-10-19 LAB — VITAMIN D 25 HYDROXY (VIT D DEFICIENCY, FRACTURES): Vit D, 25-Hydroxy: 8.6 ng/mL — ABNORMAL LOW (ref 30.0–100.0)

## 2020-10-19 LAB — CBC
Hematocrit: 36.7 % (ref 34.0–46.6)
Hemoglobin: 12.1 g/dL (ref 11.1–15.9)
MCH: 29.7 pg (ref 26.6–33.0)
MCHC: 33 g/dL (ref 31.5–35.7)
MCV: 90 fL (ref 79–97)
Platelets: 233 10*3/uL (ref 150–450)
RBC: 4.08 x10E6/uL (ref 3.77–5.28)
RDW: 12.6 % (ref 11.7–15.4)
WBC: 7.9 10*3/uL (ref 3.4–10.8)

## 2020-10-19 LAB — HEPATITIS C ANTIBODY: Hep C Virus Ab: <0.1 s/co ratio (ref 0.0–0.9)

## 2020-10-19 LAB — TSH: TSH: 1.4 u[IU]/mL (ref 0.450–4.500)

## 2020-10-20 ENCOUNTER — Other Ambulatory Visit: Payer: Self-pay | Admitting: Family Medicine

## 2020-10-20 DIAGNOSIS — R748 Abnormal levels of other serum enzymes: Secondary | ICD-10-CM

## 2020-10-20 DIAGNOSIS — R7401 Elevation of levels of liver transaminase levels: Secondary | ICD-10-CM

## 2020-10-20 DIAGNOSIS — R17 Unspecified jaundice: Secondary | ICD-10-CM

## 2020-10-22 ENCOUNTER — Encounter (INDEPENDENT_AMBULATORY_CARE_PROVIDER_SITE_OTHER): Payer: Self-pay | Admitting: *Deleted

## 2020-10-27 ENCOUNTER — Encounter: Payer: Self-pay | Admitting: Family Medicine

## 2020-10-27 DIAGNOSIS — M549 Dorsalgia, unspecified: Secondary | ICD-10-CM | POA: Insufficient documentation

## 2020-10-27 NOTE — Assessment & Plan Note (Signed)
  Patient re-educated about  the importance of commitment to a  minimum of 150 minutes of exercise per week as able.  The importance of healthy food choices with portion control discussed, as well as eating regularly and within a 12 hour window most days. The need to choose "clean , green" food 50 to 75% of the time is discussed, as well as to make water the primary drink and set a goal of 64 ounces water daily.    Weight /BMI 10/18/2020 08/15/2020 07/18/2020  WEIGHT 284 lb 282 lb 282 lb 6.6 oz  HEIGHT 5\' 3"  5\' 3"  5\' 4"   BMI 50.31 kg/m2 49.95 kg/m2 48.48 kg/m2

## 2020-10-27 NOTE — Assessment & Plan Note (Signed)
Short course of tylenol recommended

## 2020-10-27 NOTE — Assessment & Plan Note (Signed)
fittted with hearing aid which is very beneficial

## 2020-10-27 NOTE — Assessment & Plan Note (Signed)
Hyperlipidemia:Low fat diet discussed and encouraged.   Lipid Panel  Lab Results  Component Value Date   CHOL 128 10/18/2020   HDL 56 10/18/2020   LDLCALC 60 10/18/2020   TRIG 57 10/18/2020   CHOLHDL 2.3 10/18/2020   Controlled, no change in medication

## 2020-10-27 NOTE — Assessment & Plan Note (Signed)
Controlled, no change in medication DASH diet and commitment to daily physical activity for a minimum of 30 minutes discussed and encouraged, as a part of hypertension management. The importance of attaining a healthy weight is also discussed.  BP/Weight 10/18/2020 08/15/2020 07/18/2020 07/04/2020 06/29/2020 04/17/8831 02/13/9825  Systolic BP 415 830 - - 940 - -  Diastolic BP 81 75 - - 70 - -  Wt. (Lbs) 284 282 282.41 280.87 280 280.43 277.78  BMI 50.31 49.95 48.48 48.21 48.06 48.13 47.68

## 2020-11-06 ENCOUNTER — Other Ambulatory Visit: Payer: Self-pay

## 2020-11-06 ENCOUNTER — Encounter: Payer: Self-pay | Admitting: Family Medicine

## 2020-11-06 ENCOUNTER — Telehealth (INDEPENDENT_AMBULATORY_CARE_PROVIDER_SITE_OTHER): Payer: BC Managed Care – PPO | Admitting: Nurse Practitioner

## 2020-11-06 ENCOUNTER — Encounter: Payer: Self-pay | Admitting: Nurse Practitioner

## 2020-11-06 DIAGNOSIS — U071 COVID-19: Secondary | ICD-10-CM | POA: Diagnosis not present

## 2020-11-06 MED ORDER — NOREL AD 4-10-325 MG PO TABS: 1.0000 | ORAL_TABLET | ORAL | 1 refills | Status: DC | PRN

## 2020-11-06 MED ORDER — UNABLE TO FIND: 5.0000 mL | 0 refills | Status: DC | PRN

## 2020-11-06 NOTE — Progress Notes (Signed)
Acute Office Visit  Subjective:    Patient ID: [name], female    DOB: 04-Jul-1971, 50 y.o.   MRN: [id number]  Chief Complaint  Patient presents with  . Covid Positive    Tested positive today, started symptoms yesterday.  . Fever    101.2 at the highest, started yesterday.  . Diarrhea    X1 day   . Chills    X1 day    HPI Patient is in today for COVID via rapid test.  She had a headache yesterday, but then she had a tickle in her throat. She has been feeling fatigue, and she developed a cough. She has been using a humidifier and took some nasal decongestant.  She lost sense of smell today too.  Past Medical History:  Diagnosis Date  . CAD (coronary artery disease), native coronary artery    s/p DES to LAD 02/01/20  . Chest pain 01/2020  . Facial numbness   . Fibroid   . Fx ankle   . Hypertension   . Obesity     Past Surgical History:  Procedure Laterality Date  . APPENDECTOMY    . APPLICATION OF CRANIAL NAVIGATION N/A 08/16/2019   Procedure: APPLICATION OF CRANIAL NAVIGATION;  Surgeon: Judith Part, MD;  Location: [facility];  Service: Neurosurgery;  Laterality: N/A;  . bilateral breast reduction  2004  . CHOLECYSTECTOMY    . CORONARY STENT INTERVENTION N/A 02/01/2020   Procedure: CORONARY STENT INTERVENTION;  Surgeon: Burnell Blanks, MD;  Location: [facility];  Service: Cardiovascular;  Laterality: N/A;  . CRANIOTOMY Left 08/16/2019   Procedure: Left craniotomy for tumor resection;  Surgeon: Judith Part, MD;  Location: [facility];  Service: Neurosurgery;  Laterality: Left;  Left craniotomy for tumor resection  . INTRAVASCULAR PRESSURE WIRE/FFR STUDY N/A 02/01/2020   Procedure: INTRAVASCULAR PRESSURE WIRE/FFR STUDY;  Surgeon: Burnell Blanks, MD;  Location: [facility];  Service: Cardiovascular;  Laterality: N/A;  . LEFT HEART CATH AND CORONARY ANGIOGRAPHY N/A 02/01/2020   Procedure: LEFT HEART CATH AND CORONARY ANGIOGRAPHY;   Surgeon: Burnell Blanks, MD;  Location: [facility];  Service: Cardiovascular;  Laterality: N/A;  . REDUCTION MAMMAPLASTY Bilateral   . removal of left ovarian cyst  2003  . TUBAL LIGATION  2011   Women's, removal of tubes 2013    Family History  Problem Relation Age of Onset  . Coronary artery disease Mother   . Hypertension Mother   . Coronary artery disease Father   . Kidney disease Father   . Hypertension Father   . Multiple sclerosis Sister   . Hypertension Sister   . Stroke Maternal Grandmother   . Heart attack Maternal Grandfather   . Anesthesia problems Neg Hx   . Hypotension Neg Hx   . Malignant hyperthermia Neg Hx   . Pseudochol deficiency Neg Hx     Social History   Socioeconomic History  . Marital status: Single    Spouse name: Not on file  . Number of children: 4  . Years of education: Not on file  . Highest education level: Associate degree: occupational, Hotel manager, or vocational program  Occupational History  . Not on file  Tobacco Use  . Smoking status: Never Smoker  . Smokeless tobacco: Never Used  Vaping Use  . Vaping Use: Never used  Substance and Sexual Activity  . Alcohol use: Yes    Comment: rarely  . Drug use: No  .  Sexual activity: Yes    Birth control/protection: Surgical  Other Topics Concern  . Not on file  Social History Narrative   Lives with children   Caffeine- sodas, 48 oz daily   Social Determinants of Health   Financial Resource Strain: Not on file  Food Insecurity: Not on file  Transportation Needs: Not on file  Physical Activity: Not on file  Stress: Not on file  Social Connections: Not on file  Intimate Partner Violence: Not on file    Outpatient Medications Prior to Visit  Medication Sig Dispense Refill  . acetaminophen (TYLENOL) 325 MG tablet Take 325 mg by mouth every 6 (six) hours as needed.    Marland Kitchen amLODipine (NORVASC) 10 MG tablet Take 1 tablet by mouth once daily 90 tablet 0  . aspirin 81 MG  chewable tablet Chew 1 tablet (81 mg total) by mouth daily. 90 tablet 1  . atorvastatin (LIPITOR) 80 MG tablet Take 1 tablet (80 mg total) by mouth daily. 90 tablet 3  . cloNIDine (CATAPRES) 0.3 MG tablet TAKE ONE TABLET BY MOUTH AT BEDTIME 90 tablet 3  . labetalol (NORMODYNE) 200 MG tablet TAKE ONE TABLET BY MOUTH ONCE DAILY EVERY MORNING 90 tablet 3  . lisinopril (ZESTRIL) 10 MG tablet TAKE 1 TABLET BY MOUTH AT BEDTIME 90 tablet 3  . ondansetron (ZOFRAN) 4 MG tablet Take 1 tablet (4 mg total) by mouth every 8 (eight) hours as needed. 20 tablet 0  . ticagrelor (BRILINTA) 90 MG TABS tablet Take 1 tablet (90 mg total) by mouth 2 (two) times daily. 180 tablet 3  . triamterene-hydrochlorothiazide (MAXZIDE) 75-50 MG tablet Take 1 tablet by mouth daily. 90 tablet 1  . nitroGLYCERIN (NITROSTAT) 0.4 MG SL tablet Place 1 tablet (0.4 mg total) under the tongue every 5 (five) minutes as needed. 25 tablet 3   No facility-administered medications prior to visit.    No Known Allergies  Review of Systems  Constitutional: Positive for chills, fatigue and fever.  HENT: Positive for congestion, rhinorrhea, sinus pressure, sinus pain, sneezing and sore throat.   Respiratory: Positive for cough and shortness of breath. Negative for wheezing.        O2 sat 96%  Cardiovascular: Negative.   Gastrointestinal: Positive for diarrhea.       Objective:    Physical Exam  Temp 99.1 F (37.3 C)   Ht 5\' 3"  (1.6 m)   Wt 278 lb (126.1 kg)   LMP 11/02/2020   BMI 49.25 kg/m  Wt Readings from Last 3 Encounters:  11/06/20 278 lb (126.1 kg)  10/18/20 284 lb (128.8 kg)  08/15/20 282 lb (127.9 kg)    Health Maintenance Due  Topic Date Due  . COLONOSCOPY (Pts 45-15yrs Insurance coverage will need to be confirmed)  Never done    There are no preventive care reminders to display for this patient.   Lab Results  Component Value Date   TSH 1.400 10/18/2020   Lab Results  Component Value Date   WBC 7.9  10/18/2020   HGB 12.1 10/18/2020   HCT 36.7 10/18/2020   MCV 90 10/18/2020   PLT 233 10/18/2020   Lab Results  Component Value Date   NA 142 10/18/2020   K 3.7 10/18/2020   CO2 20 10/18/2020   GLUCOSE 98 10/18/2020   BUN 12 10/18/2020   CREATININE 0.79 10/18/2020   BILITOT 1.4 (H) 10/18/2020   ALKPHOS 185 (H) 10/18/2020   AST 40 10/18/2020   ALT 37 (H) 10/18/2020  PROT 6.7 10/18/2020   ALBUMIN 4.0 10/18/2020   CALCIUM 8.9 10/18/2020   ANIONGAP 13 02/02/2020   Lab Results  Component Value Date   CHOL 128 10/18/2020   Lab Results  Component Value Date   HDL 56 10/18/2020   Lab Results  Component Value Date   LDLCALC 60 10/18/2020   Lab Results  Component Value Date   TRIG 57 10/18/2020   Lab Results  Component Value Date   CHOLHDL 2.3 10/18/2020   Lab Results  Component Value Date   HGBA1C 5.0 02/02/2020       Assessment & Plan:   Problem List Items Addressed This Visit      Other   COVID-19    -had positive rapid test today 11/06/20; symptoms started 11/05/20 -Rx. norel -Rx. Apothecary cough syrup -call if symptoms get worse or O2 sats drop; today her O2 sat is 96%           Meds ordered this encounter  Medications  . Chlorphen-PE-Acetaminophen (NOREL AD) 4-10-325 MG TABS    Sig: Take 1 tablet by mouth every 4 (four) hours as needed (nasal congestion, cold symptoms).    Dispense:  20 tablet    Refill:  1  . UNABLE TO FIND    Sig: Take 5 mLs by mouth every 4 (four) hours as needed. Med Name: Apothecary Cough Syrup Take 5 mL by mouth every 4 to 6 hours as needed for cough    Dispense:  120 mL    Refill:  0   Date:  11/06/2020   Location of Patient: Home Location of Provider: Office Consent was obtain for visit to be over via telehealth. I verified that I am speaking with the correct person using two identifiers.  I connected with  BELANNA VESTAL on 11/06/20 via telephone and verified that I am speaking with the correct person using two  identifiers.   I discussed the limitations of evaluation and management by telemedicine. The patient expressed understanding and agreed to proceed.  Time spent: 9 min   Noreene Larsson, NP

## 2020-11-06 NOTE — Assessment & Plan Note (Signed)
-  had positive rapid test today 11/06/20; symptoms started 11/05/20 -Rx. norel -Rx. Apothecary cough syrup -call if symptoms get worse or O2 sats drop; today her O2 sat is 96%

## 2020-11-07 ENCOUNTER — Telehealth: Payer: BC Managed Care – PPO | Admitting: Nurse Practitioner

## 2020-11-14 ENCOUNTER — Ambulatory Visit (HOSPITAL_COMMUNITY): Payer: BC Managed Care – PPO

## 2020-12-31 ENCOUNTER — Encounter: Payer: Self-pay | Admitting: Family Medicine

## 2021-01-01 ENCOUNTER — Ambulatory Visit (HOSPITAL_COMMUNITY): Payer: BC Managed Care – PPO

## 2021-01-02 ENCOUNTER — Other Ambulatory Visit: Payer: Self-pay | Admitting: Family Medicine

## 2021-01-02 DIAGNOSIS — I10 Essential (primary) hypertension: Secondary | ICD-10-CM

## 2021-01-02 MED ORDER — AMLODIPINE BESYLATE 10 MG PO TABS: 10.0000 mg | ORAL_TABLET | Freq: Every day | ORAL | 0 refills | Status: DC

## 2021-01-10 ENCOUNTER — Ambulatory Visit (HOSPITAL_COMMUNITY)
Admission: RE | Admit: 2021-01-10 | Discharge: 2021-01-10 | Disposition: A | Discharge status: Home / Self Care | Payer: BC Managed Care – PPO | Source: Ambulatory Visit | Attending: Neurological Surgery | Admitting: Neurological Surgery

## 2021-01-10 DIAGNOSIS — G9389 Other specified disorders of brain: Secondary | ICD-10-CM | POA: Diagnosis not present

## 2021-01-10 DIAGNOSIS — Z9889 Other specified postprocedural states: Secondary | ICD-10-CM | POA: Diagnosis not present

## 2021-01-10 DIAGNOSIS — J3489 Other specified disorders of nose and nasal sinuses: Secondary | ICD-10-CM | POA: Diagnosis not present

## 2021-01-10 DIAGNOSIS — D333 Benign neoplasm of cranial nerves: Secondary | ICD-10-CM | POA: Insufficient documentation

## 2021-01-10 IMAGING — MR MR HEAD WO/W CM
16 of 18 series · 32 of 48 positions shown · IV contrast (gadavist)
Comparison: MRI head [DATE]

CLINICAL DATA: Postop resection of left vestibular schwannoma.

EXAM:
MRI HEAD WITHOUT AND WITH CONTRAST
TECHNIQUE: Multiplanar, multiecho pulse sequences of the brain and surrounding
structures were obtained without and with intravenous contrast.
CONTRAST:  10mL GADAVIST GADOBUTROL 1 MMOL/ML IV SOLN

[Series 5: DWI · axial · 3.0mm · 0.77mm/px · z∈[-101,+39]mm · 2 of 48 slices shown (1 of 6)]
[im 1/48]
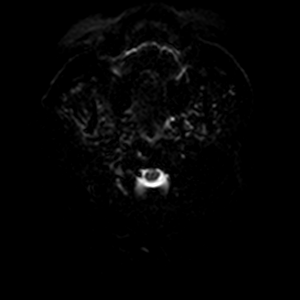
[im 48/48]
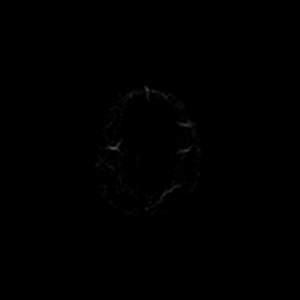

[Series 5: DWI · axial · 3.0mm · 0.77mm/px · z∈[-101,+39]mm · 2 of 48 slices shown (2 of 6)]
[im 1/48]
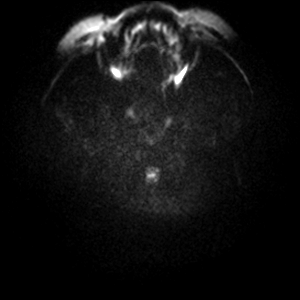
[im 48/48]
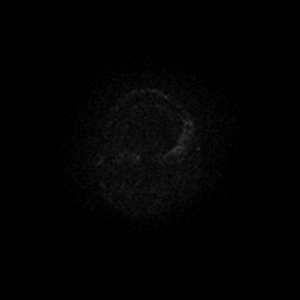

[Series 6: DWI · axial · 3.0mm · 0.77mm/px · z∈[-101,+39]mm · 3 of 48 slices shown (3 of 6)]
[im 1/48]
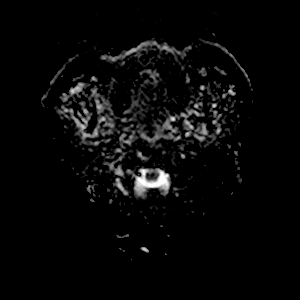
[im 24/48]
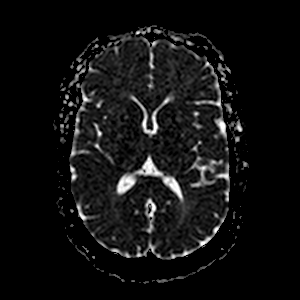
[im 48/48]
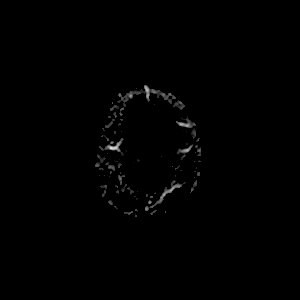

[Series 7: DWI · coronal · 5.0mm · 0.88mm/px · 2 of 28 slices shown (4 of 6)]
[im 1/28]
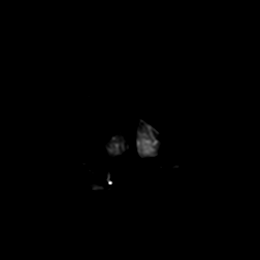
[im 28/28]
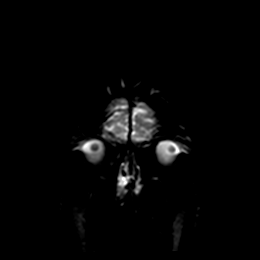

[Series 7: DWI · coronal · 5.0mm · 0.88mm/px · 2 of 28 slices shown (5 of 6)]
[im 1/28]
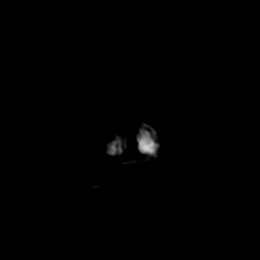
[im 28/28]
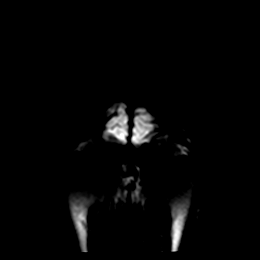

[Series 8: DWI · coronal · 5.0mm · 0.88mm/px · 2 of 28 slices shown (6 of 6)]
[im 1/28]
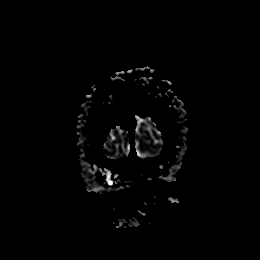
[im 28/28]
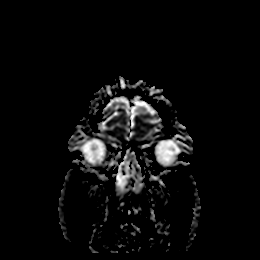

[Series 9: T1 · sagittal · 5.0mm · 0.75mm/px · 1 of 19 slices shown]
[im 1/19]
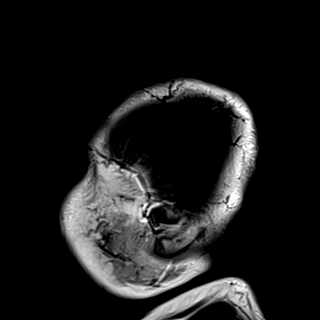

[Series 10: T2 · axial · 5.0mm · 0.72mm/px · 1 of 20 slices shown]
[im 1/20]
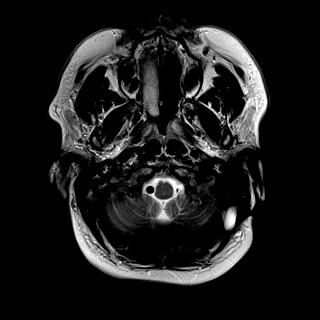

[Series 11: mag_images · axial · 3.0mm · 0.90mm/px · z∈[-119,+57]mm · 3 of 60 slices shown]
[im 1/60]
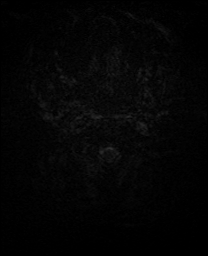
[im 30/60]
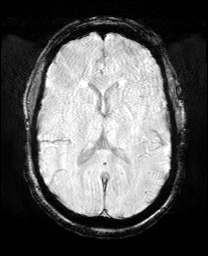
[im 60/60]
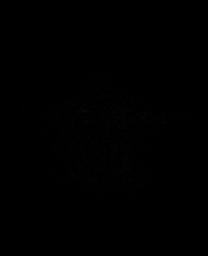

[Series 12: pha_images · axial · 3.0mm · 0.90mm/px · z∈[-113,+57]mm · 3 of 58 slices shown]
[im 1/58]
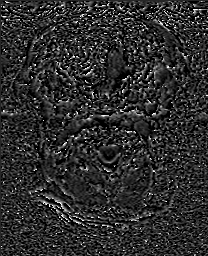
[im 29/58]
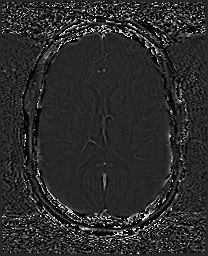
[im 58/58]
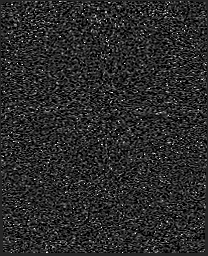

[Series 13: swi_images · axial · 3.0mm · 0.90mm/px · z∈[-119,+57]mm · 3 of 60 slices shown]
[im 1/60]
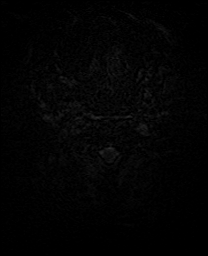
[im 30/60]
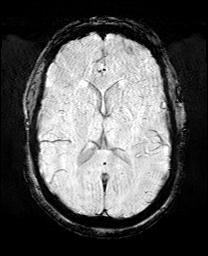
[im 60/60]
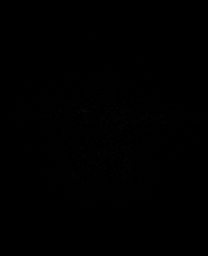

[Series 15: FLAIR · axial · 3.0mm · 0.45mm/px · z∈[-101,+39]mm · 3 of 48 slices shown (1 of 2)]
[im 1/48]
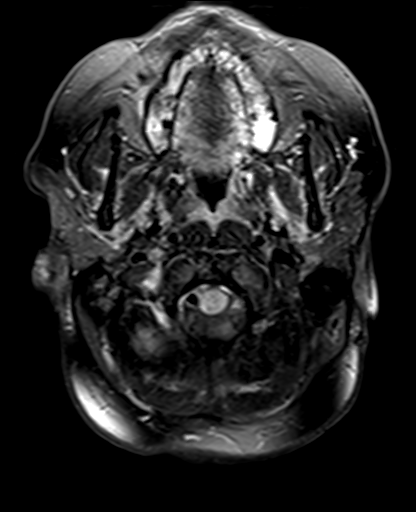
[im 24/48]
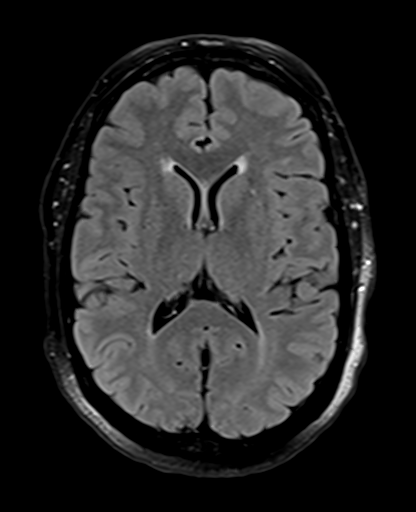
[im 48/48]
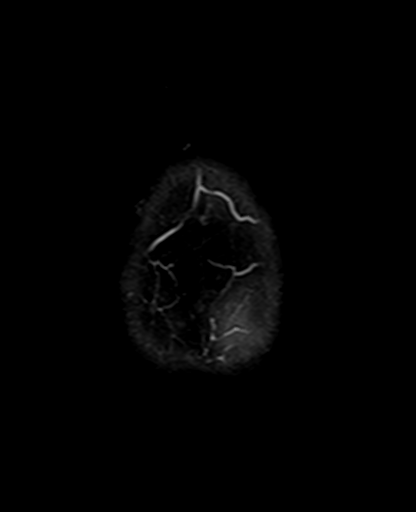

[Series 17: FLAIR · sagittal · 5.0mm · 0.94mm/px · 1 of 17 slices shown (2 of 2)]
[im 1/17]
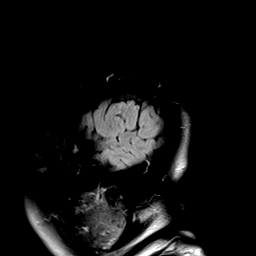

[Series 18: T2 post-contrast · coronal · 5.0mm · 0.72mm/px · 2 of 28 slices shown]
[im 1/28]
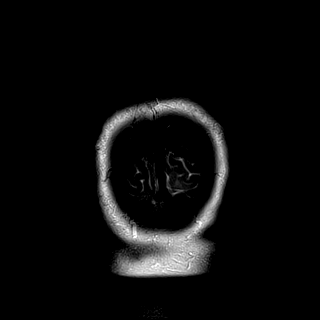
[im 28/28]
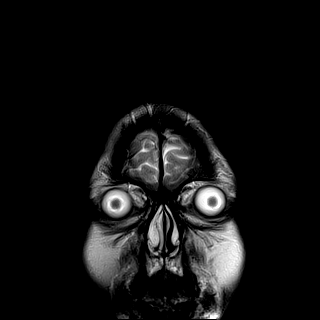

[Series 20: T1 post-contrast · coronal · 5.0mm · 0.34mm/px · 1 of 26 slices shown (1 of 2)]
[im 1/26]
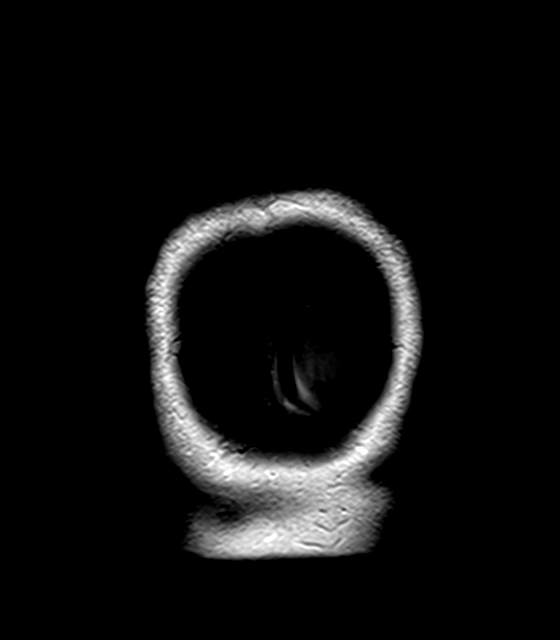

[Series 21: T1 post-contrast · sagittal · 5.0mm · 0.75mm/px · 1 of 19 slices shown (2 of 2)]
[im 1/19]
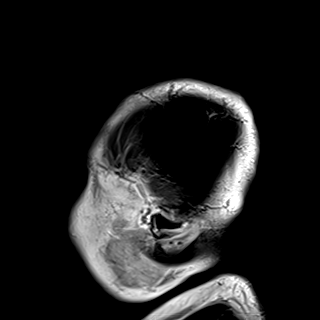

[32 of 48 positions shown; findings below may reference images not displayed]

FINDINGS: Brain: Left occipital craniotomy for resection of vestibular
schwannoma. No recurrent tumor identified in the left internal
auditory canal. Previously noted mild enhancement within the left
internal auditory canal has resolved. No mass lesion. Mild
encephalomalacia left lateral cerebellum unchanged. Right internal
auditory canal normal.

Ventricle size and cerebral volume normal. Scattered small white
matter hyperintensities bilaterally, mild in degree.

Vascular: Normal arterial flow voids.

Skull and upper cervical spine: No focal skeletal abnormality.

Sinuses/Orbits: Mild mucosal edema paranasal sinuses. Negative orbit

Other: None
IMPRESSION: Postop resection of left vestibular schwannoma without recurrence.
Previously noted mild enhancement within the left internal auditory
canal has resolved.

Mild white matter changes bilaterally, likely due to chronic
microvascular ischemia.

## 2021-01-10 MED ORDER — GADOBUTROL 1 MMOL/ML IV SOLN
10.0000 mL | Freq: Once | INTRAVENOUS | Status: AC | PRN
  Administered 2021-01-10: 10 mL via INTRAVENOUS

## 2021-01-21 ENCOUNTER — Ambulatory Visit (INDEPENDENT_AMBULATORY_CARE_PROVIDER_SITE_OTHER): Payer: BC Managed Care – PPO | Admitting: Gastroenterology

## 2021-01-28 ENCOUNTER — Other Ambulatory Visit: Payer: Self-pay | Admitting: Family Medicine

## 2021-02-21 ENCOUNTER — Encounter: Payer: BC Managed Care – PPO | Admitting: Family Medicine

## 2021-02-25 DIAGNOSIS — H35033 Hypertensive retinopathy, bilateral: Secondary | ICD-10-CM | POA: Diagnosis not present

## 2021-03-20 ENCOUNTER — Encounter: Payer: Self-pay | Admitting: Family Medicine

## 2021-03-20 ENCOUNTER — Other Ambulatory Visit: Payer: Self-pay | Admitting: Cardiovascular Disease

## 2021-03-20 ENCOUNTER — Other Ambulatory Visit: Payer: Self-pay | Admitting: *Deleted

## 2021-03-20 MED ORDER — TICAGRELOR 90 MG PO TABS: 90.0000 mg | ORAL_TABLET | Freq: Two times a day (BID) | ORAL | 1 refills | Status: DC

## 2021-03-20 MED ORDER — CLONIDINE HCL 0.3 MG PO TABS: ORAL_TABLET | ORAL | 0 refills | Status: DC

## 2021-03-20 MED ORDER — TRIAMTERENE-HCTZ 75-50 MG PO TABS: 1.0000 | ORAL_TABLET | Freq: Every day | ORAL | 1 refills | Status: DC

## 2021-03-20 MED ORDER — LABETALOL HCL 200 MG PO TABS: ORAL_TABLET | ORAL | 3 refills | Status: DC

## 2021-03-21 ENCOUNTER — Other Ambulatory Visit: Payer: Self-pay

## 2021-03-21 ENCOUNTER — Encounter: Payer: BC Managed Care – PPO | Admitting: Family Medicine

## 2021-03-28 ENCOUNTER — Encounter (INDEPENDENT_AMBULATORY_CARE_PROVIDER_SITE_OTHER): Payer: Self-pay | Admitting: Gastroenterology

## 2021-03-28 ENCOUNTER — Other Ambulatory Visit: Payer: Self-pay

## 2021-03-28 ENCOUNTER — Ambulatory Visit (INDEPENDENT_AMBULATORY_CARE_PROVIDER_SITE_OTHER): Payer: BC Managed Care – PPO | Admitting: Gastroenterology

## 2021-03-28 DIAGNOSIS — R7989 Other specified abnormal findings of blood chemistry: Secondary | ICD-10-CM | POA: Insufficient documentation

## 2021-03-28 NOTE — Patient Instructions (Addendum)
Schedule liver US Schedule colonoscopy

## 2021-03-28 NOTE — Progress Notes (Signed)
[name], M.D. Gastroenterology & Hepatology [facility] [address] [place], [place] [address] Primary Care Physician: Fayrene Helper, MD [address] [place] [address]  Referring MD: PCP  Chief Complaint: Elevated bilirubin and alkaline phosphatase  History of Present Illness: [name] is a 50 y.o. female with PMH brain tumor s/p resection, MI s/p stent placement on DAPT (including Brillinta) who presents for evaluation of elevated bilirubin and alkaline phosphatase.  The patient has been found to have elevated bilirubin and alkaline phosphatase in her most recent blood work-up and was referred for evaluation by gastroenterology.  Patient reports that she has been relatively well.  The patient reports that she has presented some itching of her hands and feet for the last 8 months which is intermittent. She reports that she occasionally feels nausea but does not vomit. Has gained 29 lb for the last 8 months.  Has not been taking any new medications, only tried some cough syrup in February for management of COVID symptoms. Has not tried any supplements or OTC herbs.  She noticed some left sided flank pain for the last year. It is intermittent and it improves with Tylenol occasionally.  The patient denies having any fever, chills, hematochezia, melena, hematemesis, abdominal distention, diarrhea, jaundice.  Upon review of the medical record, the patient was found to have mild elevation of her alkaline phosphatase since 2015 when it was 119 but it normalized afterwards, however in 2021 it rose up to 196 and most recently 10/18/2020 he had a value of 185.  Her total bilirubin has been less than 1.0 through the years although in 2016 it was 1.3, most recently in 10/18/2020 it went up to 1.4.  She also had some mild elevation of her aminotransferases during the last work-up she performed as her AST was 40 and ALT was  77, but previous aminotransferases have been below 30.  No abdominal imaging is available.  Last EGD: 2005 - normal per patient, performed at [facility] but no report is available. Last Colonoscopy:never  FHx: neg for any gastrointestinal/liver disease, no malignancies Social: neg smoking, frequent alcohol or illicit drug use Surgical: bilateral tubal ligation, partial hysterectomy, appendectomy, cholecystectomy  Past Medical History: Past Medical History:  Diagnosis Date   CAD (coronary artery disease), native coronary artery    s/p DES to LAD 02/01/20   Chest pain 01/2020   Facial numbness    Fibroid    Fx ankle    Hypertension    Obesity     Past Surgical History: Past Surgical History:  Procedure Laterality Date   APPENDECTOMY     APPLICATION OF CRANIAL NAVIGATION N/A 08/16/2019   Procedure: APPLICATION OF CRANIAL NAVIGATION;  Surgeon: Judith Part, MD;  Location: [place];  Service: Neurosurgery;  Laterality: N/A;   bilateral breast reduction  2004   CHOLECYSTECTOMY     CORONARY STENT INTERVENTION N/A 02/01/2020   Procedure: CORONARY STENT INTERVENTION;  Surgeon: Burnell Blanks, MD;  Location: [place] [facility];  Service: Cardiovascular;  Laterality: N/A;   CRANIOTOMY Left 08/16/2019   Procedure: Left craniotomy for tumor resection;  Surgeon: Judith Part, MD;  Location: [place];  Service: Neurosurgery;  Laterality: Left;  Left craniotomy for tumor resection   INTRAVASCULAR PRESSURE WIRE/FFR STUDY N/A 02/01/2020   Procedure: INTRAVASCULAR PRESSURE WIRE/FFR STUDY;  Surgeon: Burnell Blanks, MD;  Location: [place] [facility];  Service: Cardiovascular;  Laterality: N/A;  LEFT HEART CATH AND CORONARY ANGIOGRAPHY N/A 02/01/2020   Procedure: LEFT HEART CATH AND CORONARY ANGIOGRAPHY;  Surgeon: Burnell Blanks, MD;  Location: [place] [facility];  Service: Cardiovascular;  Laterality: N/A;   REDUCTION MAMMAPLASTY Bilateral    removal of left ovarian cyst   2003   TUBAL LIGATION  2011   Women's, removal of tubes 2013    Family History: Family History  Problem Relation Age of Onset   Coronary artery disease Mother    Hypertension Mother    Coronary artery disease Father    Kidney disease Father    Hypertension Father    Multiple sclerosis Sister    Hypertension Sister    Stroke Maternal Grandmother    Heart attack Maternal Grandfather    Anesthesia problems Neg Hx    Hypotension Neg Hx    Malignant hyperthermia Neg Hx    Pseudochol deficiency Neg Hx     Social History: Social History   Tobacco Use  Smoking Status Never  Smokeless Tobacco Never   Social History   Substance and Sexual Activity  Alcohol Use Yes   Comment: rarely   Social History   Substance and Sexual Activity  Drug Use No    Allergies: No Known Allergies  Medications: Current Outpatient Medications  Medication Sig Dispense Refill   acetaminophen (TYLENOL) 325 MG tablet Take 325 mg by mouth every 6 (six) hours as needed.     amLODipine (NORVASC) 10 MG tablet Take 1 tablet by mouth once daily 90 tablet 0   aspirin 81 MG chewable tablet Chew 1 tablet (81 mg total) by mouth daily. 90 tablet 1   atorvastatin (LIPITOR) 80 MG tablet Take 1 tablet (80 mg total) by mouth daily. 90 tablet 3   cloNIDine (CATAPRES) 0.3 MG tablet TAKE 1 TABLET BY MOUTH AT BEDTIME 90 tablet 0   labetalol (NORMODYNE) 200 MG tablet TAKE ONE TABLET BY MOUTH ONCE DAILY EVERY MORNING 90 tablet 3   lisinopril (ZESTRIL) 10 MG tablet TAKE 1 TABLET BY MOUTH AT BEDTIME 90 tablet 3   nitroGLYCERIN (NITROSTAT) 0.4 MG SL tablet Place 1 tablet (0.4 mg total) under the tongue every 5 (five) minutes as needed. 25 tablet 3   ondansetron (ZOFRAN) 4 MG tablet Take 1 tablet (4 mg total) by mouth every 8 (eight) hours as needed. 20 tablet 0   ticagrelor (BRILINTA) 90 MG TABS tablet Take 1 tablet (90 mg total) by mouth 2 (two) times daily. 180 tablet 1   triamterene-hydrochlorothiazide (MAXZIDE)  75-50 MG tablet Take 1 tablet by mouth daily. 90 tablet 1   No current facility-administered medications for this visit.    Review of Systems: GENERAL: negative for malaise, night sweats HEENT: No changes in hearing or vision, no nose bleeds or other nasal problems. NECK: Negative for lumps, goiter, pain and significant neck swelling RESPIRATORY: Negative for cough, wheezing CARDIOVASCULAR: Negative for chest pain, leg swelling, palpitations, orthopnea GI: SEE HPI MUSCULOSKELETAL: Negative for joint pain or swelling, back pain, and muscle pain. SKIN: Negative for lesions, rash PSYCH: Negative for sleep disturbance, mood disorder and recent psychosocial stressors. HEMATOLOGY Negative for prolonged bleeding, bruising easily, and swollen nodes. ENDOCRINE: Negative for cold or heat intolerance, polyuria, polydipsia and goiter. NEURO: negative for tremor, gait imbalance, syncope and seizures. The remainder of the review of systems is noncontributory.   Physical Exam: BP 118/84 (BP Location: Right Arm, Patient Position: Sitting, Cuff Size: Large)   Pulse 92   Temp 99.1 F (37.3 C) (  Oral)   Ht 5\' 3"  (1.6 m)   Wt 281 lb (127.5 kg)   BMI 49.78 kg/m  GENERAL: The patient is AO x3, in no acute distress. Obese. HEENT: Head is normocephalic and atraumatic. EOMI are intact. Mouth is well hydrated and without lesions. NECK: Supple. No masses LUNGS: Clear to auscultation. No presence of rhonchi/wheezing/rales. Adequate chest expansion HEART: RRR, normal s1 and s2. ABDOMEN: Soft, nontender, no guarding, no peritoneal signs, and nondistended. BS +. No masses. EXTREMITIES: Without any cyanosis, clubbing, rash, lesions or edema. NEUROLOGIC: AOx3, no focal motor deficit. SKIN: no jaundice, no rashes   Imaging/Labs: as above  I personally reviewed and interpreted the available labs, imaging and endoscopic files.  Impression and Plan: MARGARIT MINSHALL is a 50 y.o. female with PMH brain tumor s/p  resection, MI s/p stent placement on DAPT (including Brillinta) who presents for evaluation of elevated bilirubin and alkaline phosphatase.  The patient has been relatively asymptomatic, although she has presented some episodes of pruritus of unclear etiology in her hands and feet.  Her liver enzymes so predominant cholestatic pattern with very mild elevation of her aminotransferases in the most recent blood testing.  She has not been taking any new medications or supplements that could have caused this elevation.  Rosanna Randy syndrome usually only causes isolated elevation of the total bilirubin but she has also mild elevation of the alkaline phosphatase.  We will perform a repeat CBC and CMP, as well as evaluation for viral, autoimmune and metabolic etiologies that could explain her elevated enzymes.  We will also perform right upper quadrant abdominal ultrasound as I consider it is possible she has some concomitant fatty liver causing the elevation of the aminotransferases as she has gained weight recently.  I counseled the patient about the importance of weight loss as this can have multisystemic benefits.  Finally, he is due for colorectal cancer screening, I will schedule for a colonoscopy.  - Check CMP, CBC, hepatitis A/B/C serologies, iron panel, ANA, AMA, ASMA, IgG and celiac disease serologies - Schedule RUQ [place] - Schedule colonoscopy - Patient encouraged to lose weight as this may provide benefit for her specific complaint but also due to other comorbidities  All questions were answered.      Sheri Peppers, MD Gastroenterology and Hepatology [facility]

## 2021-03-29 ENCOUNTER — Telehealth (INDEPENDENT_AMBULATORY_CARE_PROVIDER_SITE_OTHER): Payer: Self-pay

## 2021-03-29 ENCOUNTER — Other Ambulatory Visit (INDEPENDENT_AMBULATORY_CARE_PROVIDER_SITE_OTHER): Payer: Self-pay

## 2021-03-29 ENCOUNTER — Encounter (INDEPENDENT_AMBULATORY_CARE_PROVIDER_SITE_OTHER): Payer: Self-pay

## 2021-03-29 DIAGNOSIS — Z01812 Encounter for preprocedural laboratory examination: Secondary | ICD-10-CM

## 2021-03-29 MED ORDER — PEG 3350-KCL-NA BICARB-NACL 420 G PO SOLR: 4000.0000 mL | ORAL | 0 refills | Status: DC

## 2021-03-29 NOTE — Telephone Encounter (Signed)
[name], CMA  

## 2021-04-01 ENCOUNTER — Encounter (INDEPENDENT_AMBULATORY_CARE_PROVIDER_SITE_OTHER): Payer: Self-pay

## 2021-04-05 ENCOUNTER — Other Ambulatory Visit: Payer: Self-pay

## 2021-04-05 ENCOUNTER — Ambulatory Visit (HOSPITAL_COMMUNITY)
Admission: RE | Admit: 2021-04-05 | Discharge: 2021-04-05 | Disposition: A | Discharge status: Home / Self Care | Payer: BC Managed Care – PPO | Source: Ambulatory Visit | Attending: Gastroenterology | Admitting: Gastroenterology

## 2021-04-05 DIAGNOSIS — R7989 Other specified abnormal findings of blood chemistry: Secondary | ICD-10-CM | POA: Diagnosis not present

## 2021-04-05 IMAGING — US US ABDOMEN LIMITED
1 series · 14 of 25 positions shown · non-contrast
Comparison: [DATE]

CLINICAL DATA: Elevated liver function tests

EXAM:
ULTRASOUND ABDOMEN LIMITED RIGHT UPPER QUADRANT

[Series 1: us abdomen limited ruq (liver/gb) · 14 of 52 slices shown]
[im 1/52]
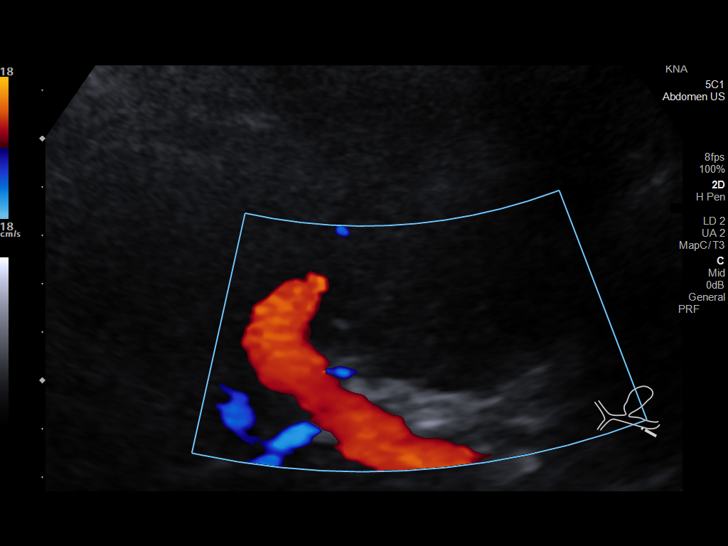
[im 5/52]
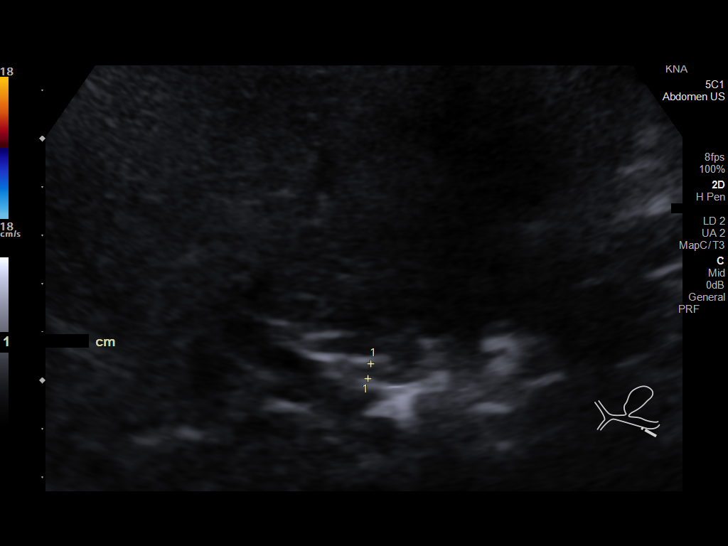
[im 9/52]
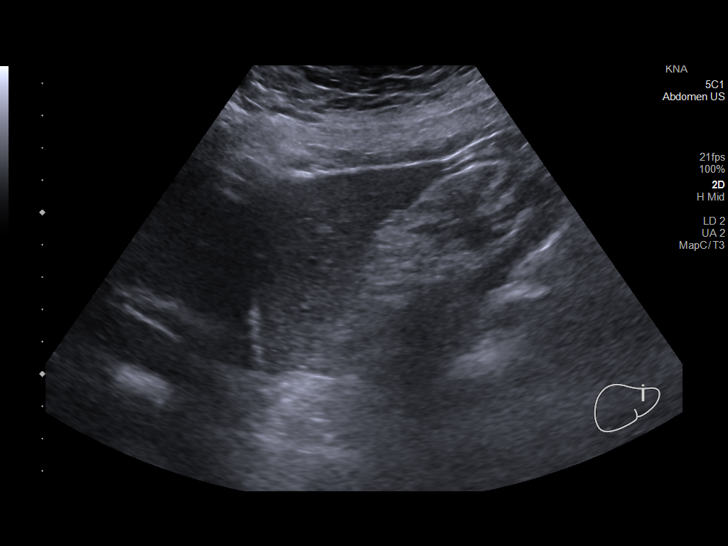
[im 13/52]
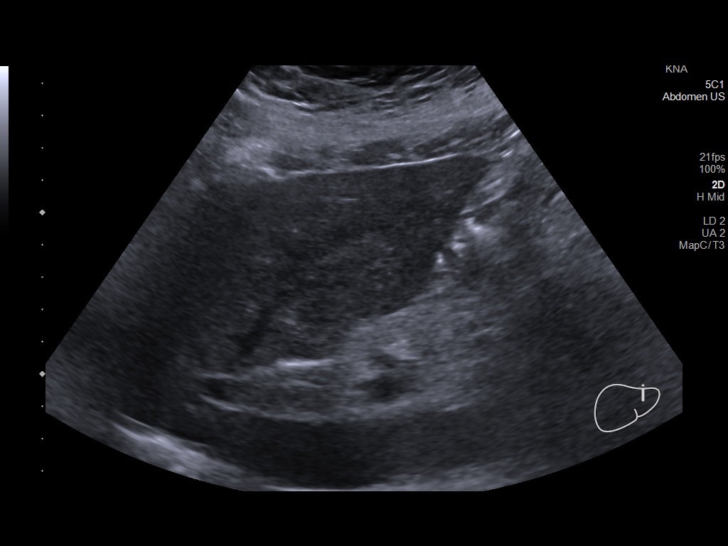
[im 18/52]
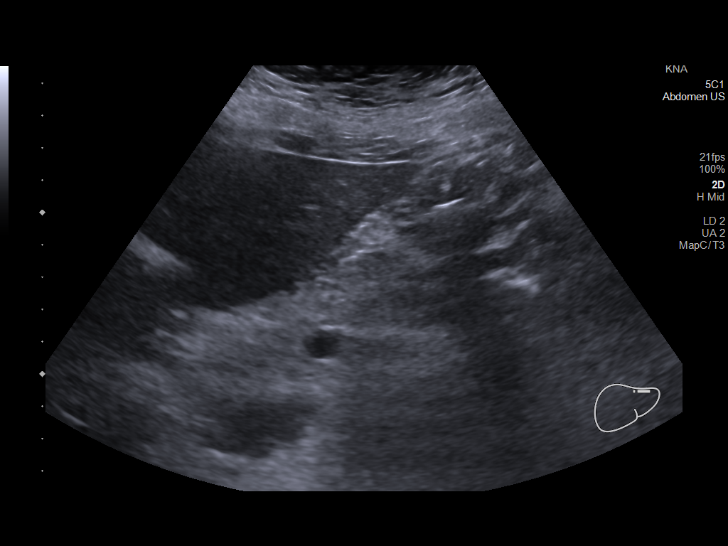
[im 20/52]
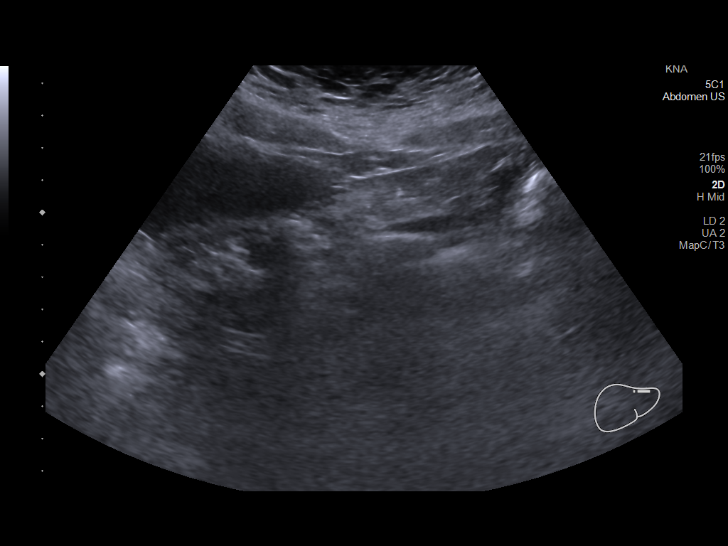
[im 24/52]
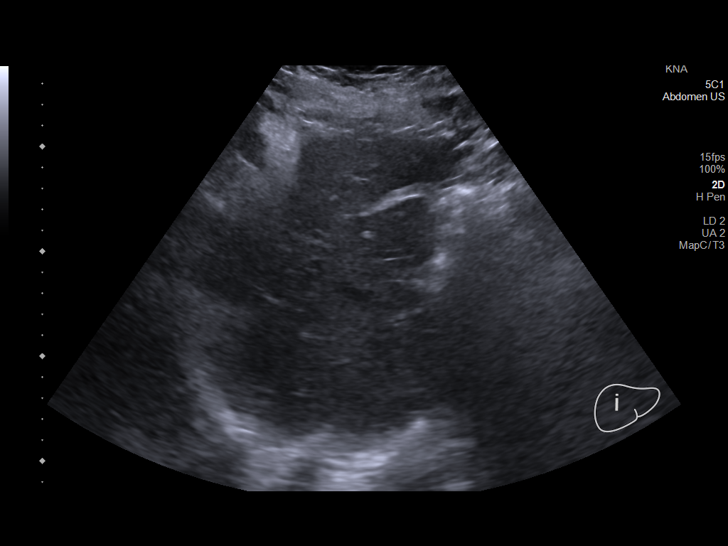
[im 28/52]
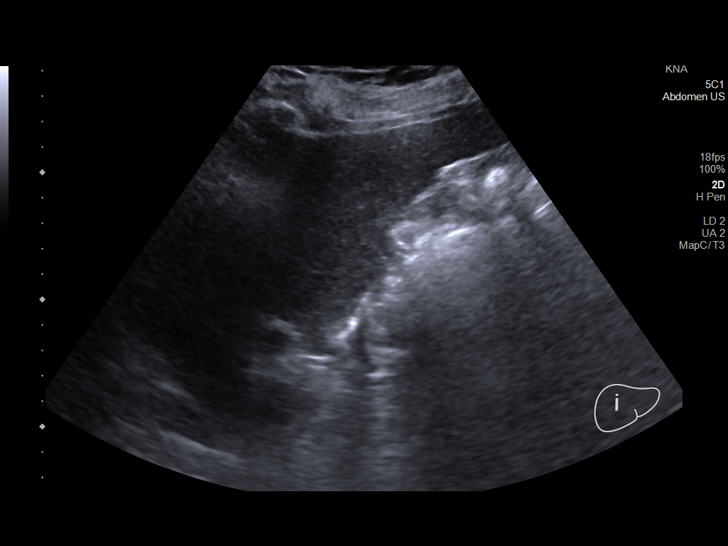
[im 32/52]
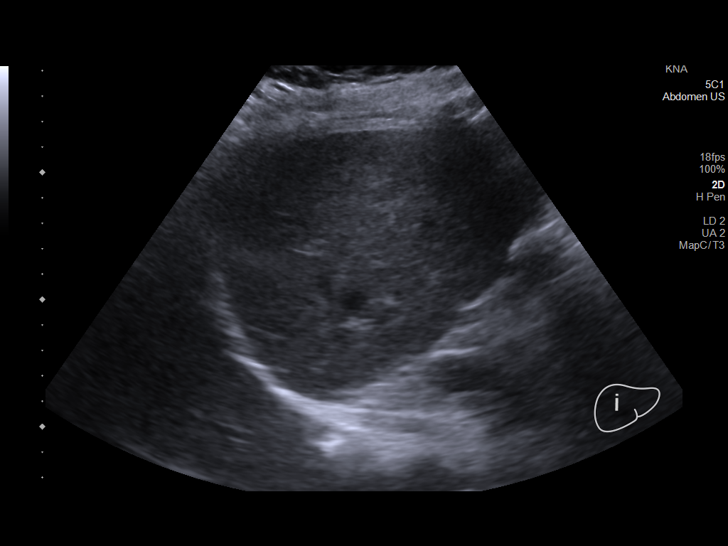
[im 35/52]
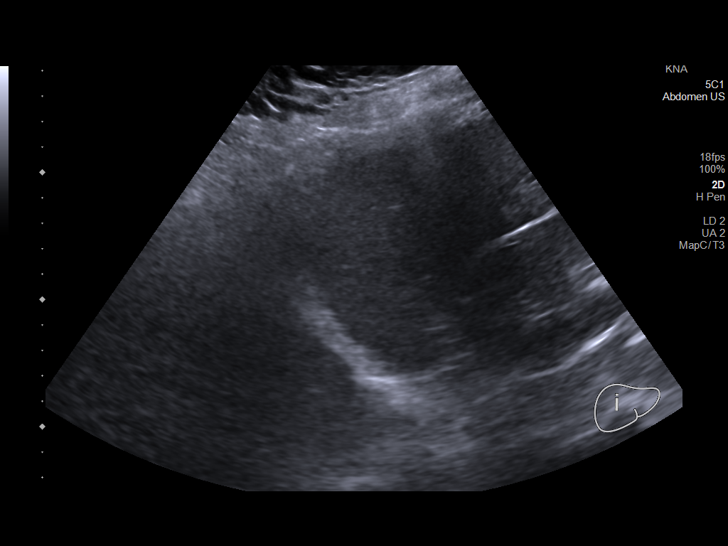
[im 39/52]
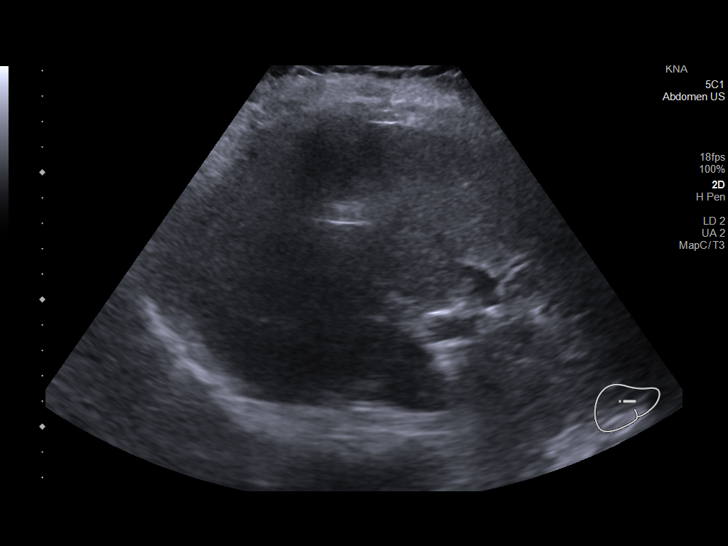
[im 43/52]
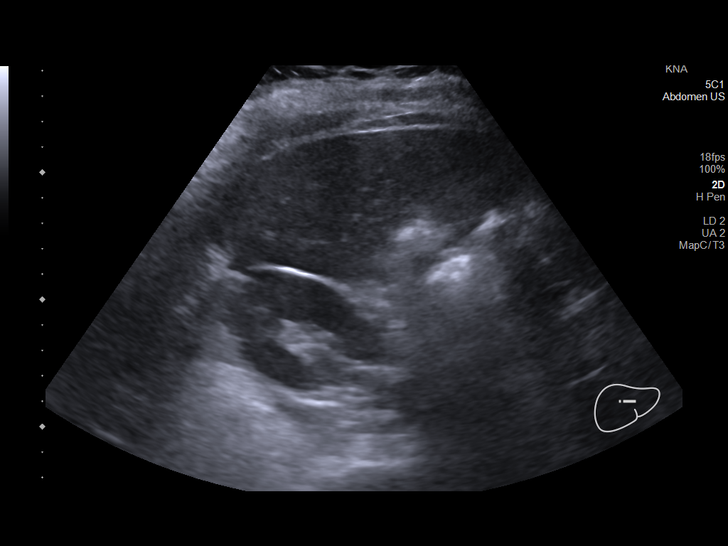
[im 47/52]
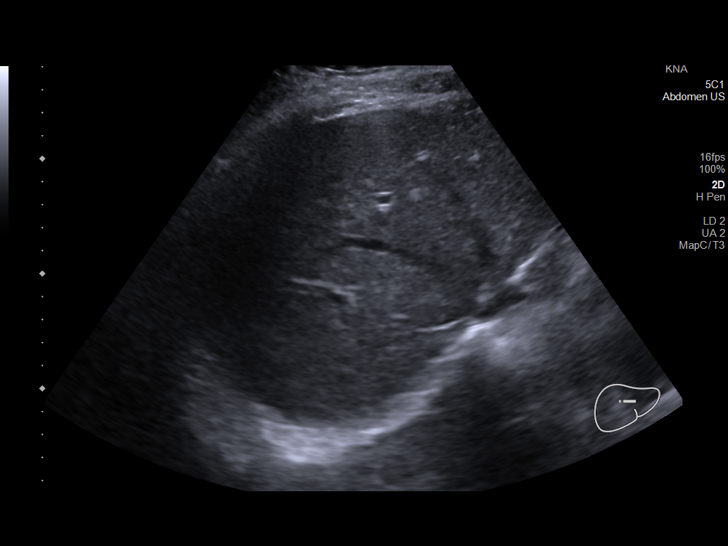
[im 52/52]
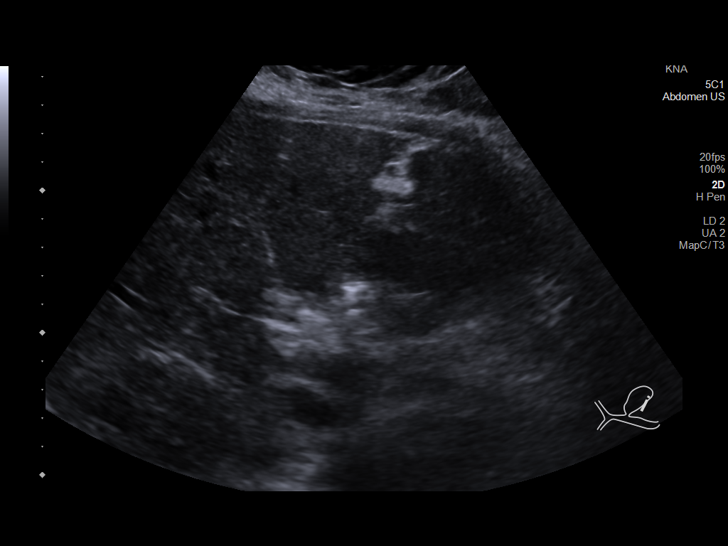

[14 of 25 positions shown; findings below may reference images not displayed]

FINDINGS: Gallbladder:

Surgically absent

Common bile duct:

Diameter: 3 mm

Liver:

No focal lesion identified. Within normal limits in parenchymal
echogenicity. Portal vein is patent on color Doppler imaging with
normal direction of blood flow towards the liver.

Other: None.
IMPRESSION: 1. Unremarkable right upper quadrant ultrasound in this patient
status post cholecystectomy.

## 2021-04-09 ENCOUNTER — Other Ambulatory Visit: Payer: Self-pay

## 2021-04-09 ENCOUNTER — Emergency Department (HOSPITAL_COMMUNITY): Payer: BC Managed Care – PPO

## 2021-04-09 ENCOUNTER — Encounter (HOSPITAL_COMMUNITY): Payer: Self-pay

## 2021-04-09 ENCOUNTER — Emergency Department (HOSPITAL_COMMUNITY)
Admission: EM | Admit: 2021-04-09 | Discharge: 2021-04-09 | Disposition: A | Discharge status: Home / Self Care | Payer: BC Managed Care – PPO | Attending: Emergency Medicine | Admitting: Emergency Medicine

## 2021-04-09 DIAGNOSIS — Z955 Presence of coronary angioplasty implant and graft: Secondary | ICD-10-CM | POA: Diagnosis not present

## 2021-04-09 DIAGNOSIS — Z79899 Other long term (current) drug therapy: Secondary | ICD-10-CM | POA: Insufficient documentation

## 2021-04-09 DIAGNOSIS — R059 Cough, unspecified: Secondary | ICD-10-CM | POA: Insufficient documentation

## 2021-04-09 DIAGNOSIS — R0789 Other chest pain: Secondary | ICD-10-CM | POA: Diagnosis not present

## 2021-04-09 DIAGNOSIS — R062 Wheezing: Secondary | ICD-10-CM | POA: Diagnosis not present

## 2021-04-09 DIAGNOSIS — Z7982 Long term (current) use of aspirin: Secondary | ICD-10-CM | POA: Insufficient documentation

## 2021-04-09 DIAGNOSIS — I10 Essential (primary) hypertension: Secondary | ICD-10-CM | POA: Insufficient documentation

## 2021-04-09 DIAGNOSIS — R079 Chest pain, unspecified: Secondary | ICD-10-CM | POA: Diagnosis not present

## 2021-04-09 DIAGNOSIS — I2510 Atherosclerotic heart disease of native coronary artery without angina pectoris: Secondary | ICD-10-CM | POA: Insufficient documentation

## 2021-04-09 DIAGNOSIS — R0602 Shortness of breath: Secondary | ICD-10-CM | POA: Diagnosis not present

## 2021-04-09 LAB — BASIC METABOLIC PANEL
Anion gap: 8 (ref 5–15)
BUN: 11 mg/dL (ref 6–20)
CO2: 26 mmol/L (ref 22–32)
Calcium: 8.6 mg/dL — ABNORMAL LOW (ref 8.9–10.3)
Chloride: 104 mmol/L (ref 98–111)
Creatinine, Ser: 0.74 mg/dL (ref 0.44–1.00)
GFR, Estimated: >60 mL/min (ref >60)
Glucose, Bld: 112 mg/dL — ABNORMAL HIGH (ref 70–99)
Potassium: 3.2 mmol/L — ABNORMAL LOW (ref 3.5–5.1)
Sodium: 138 mmol/L (ref 135–145)

## 2021-04-09 LAB — CBC
HCT: 42.4 % (ref 36.0–46.0)
Hemoglobin: 13.2 g/dL (ref 12.0–15.0)
MCH: 29.5 pg (ref 26.0–34.0)
MCHC: 31.1 g/dL (ref 30.0–36.0)
MCV: 94.6 fL (ref 80.0–100.0)
Platelets: 243 10*3/uL (ref 150–400)
RBC: 4.48 MIL/uL (ref 3.87–5.11)
RDW: 13.4 % (ref 11.5–15.5)
WBC: 9.2 10*3/uL (ref 4.0–10.5)
nRBC: 0 % (ref 0.0–0.2)

## 2021-04-09 LAB — HCG, QUANTITATIVE, PREGNANCY: hCG, Beta Chain, Quant, S: 3 m[IU]/mL (ref <5)

## 2021-04-09 LAB — TROPONIN I (HIGH SENSITIVITY)
Troponin I (High Sensitivity): 5 ng/L (ref <18)
Troponin I (High Sensitivity): 6 ng/L (ref <18)

## 2021-04-09 IMAGING — DX DG CHEST 2V
2 series · 2 of 2 positions shown · non-contrast
Comparison: [DATE]

CLINICAL DATA: Chest pain and shortness of breath

EXAM:
CHEST - 2 VIEW

[chest pa]
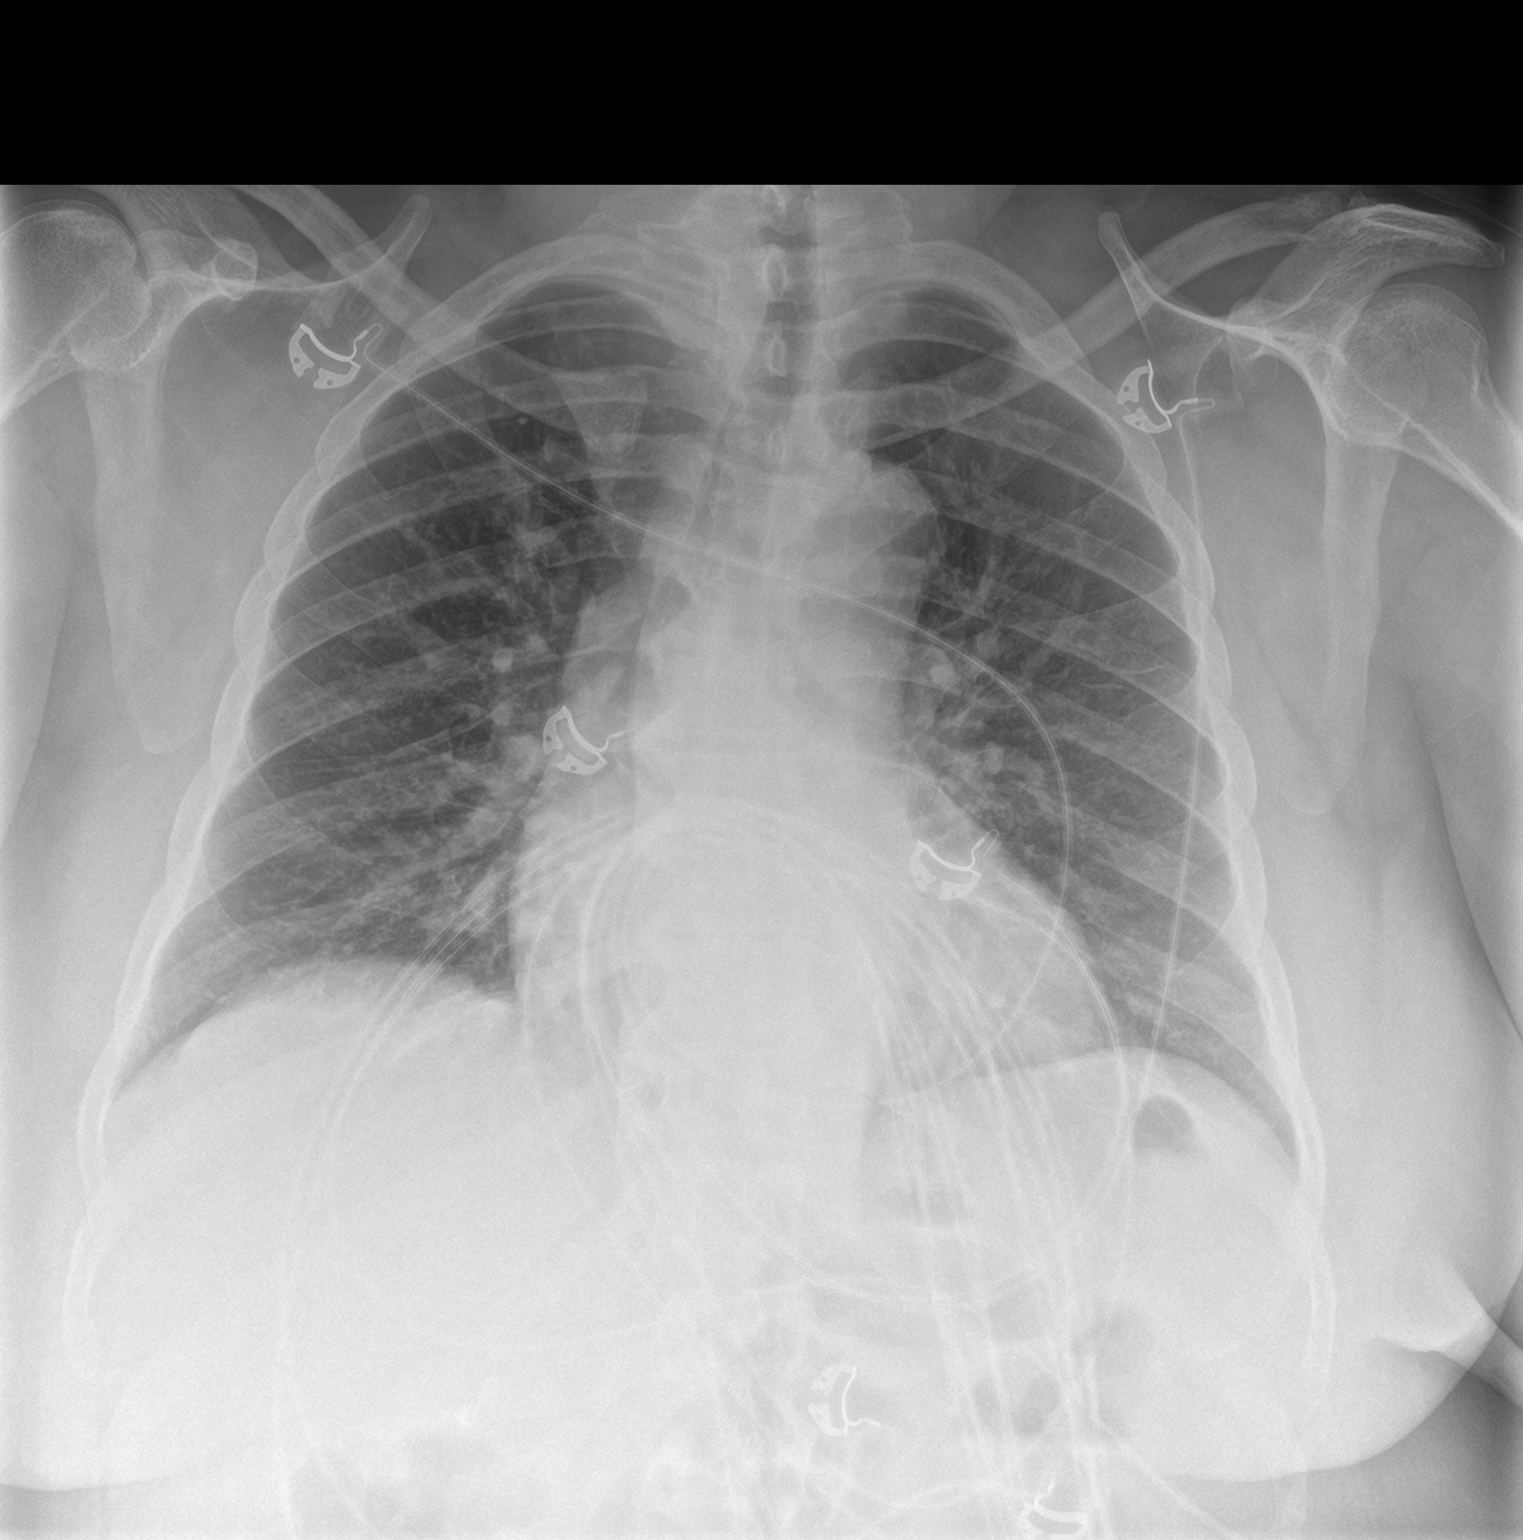

[chest lat]
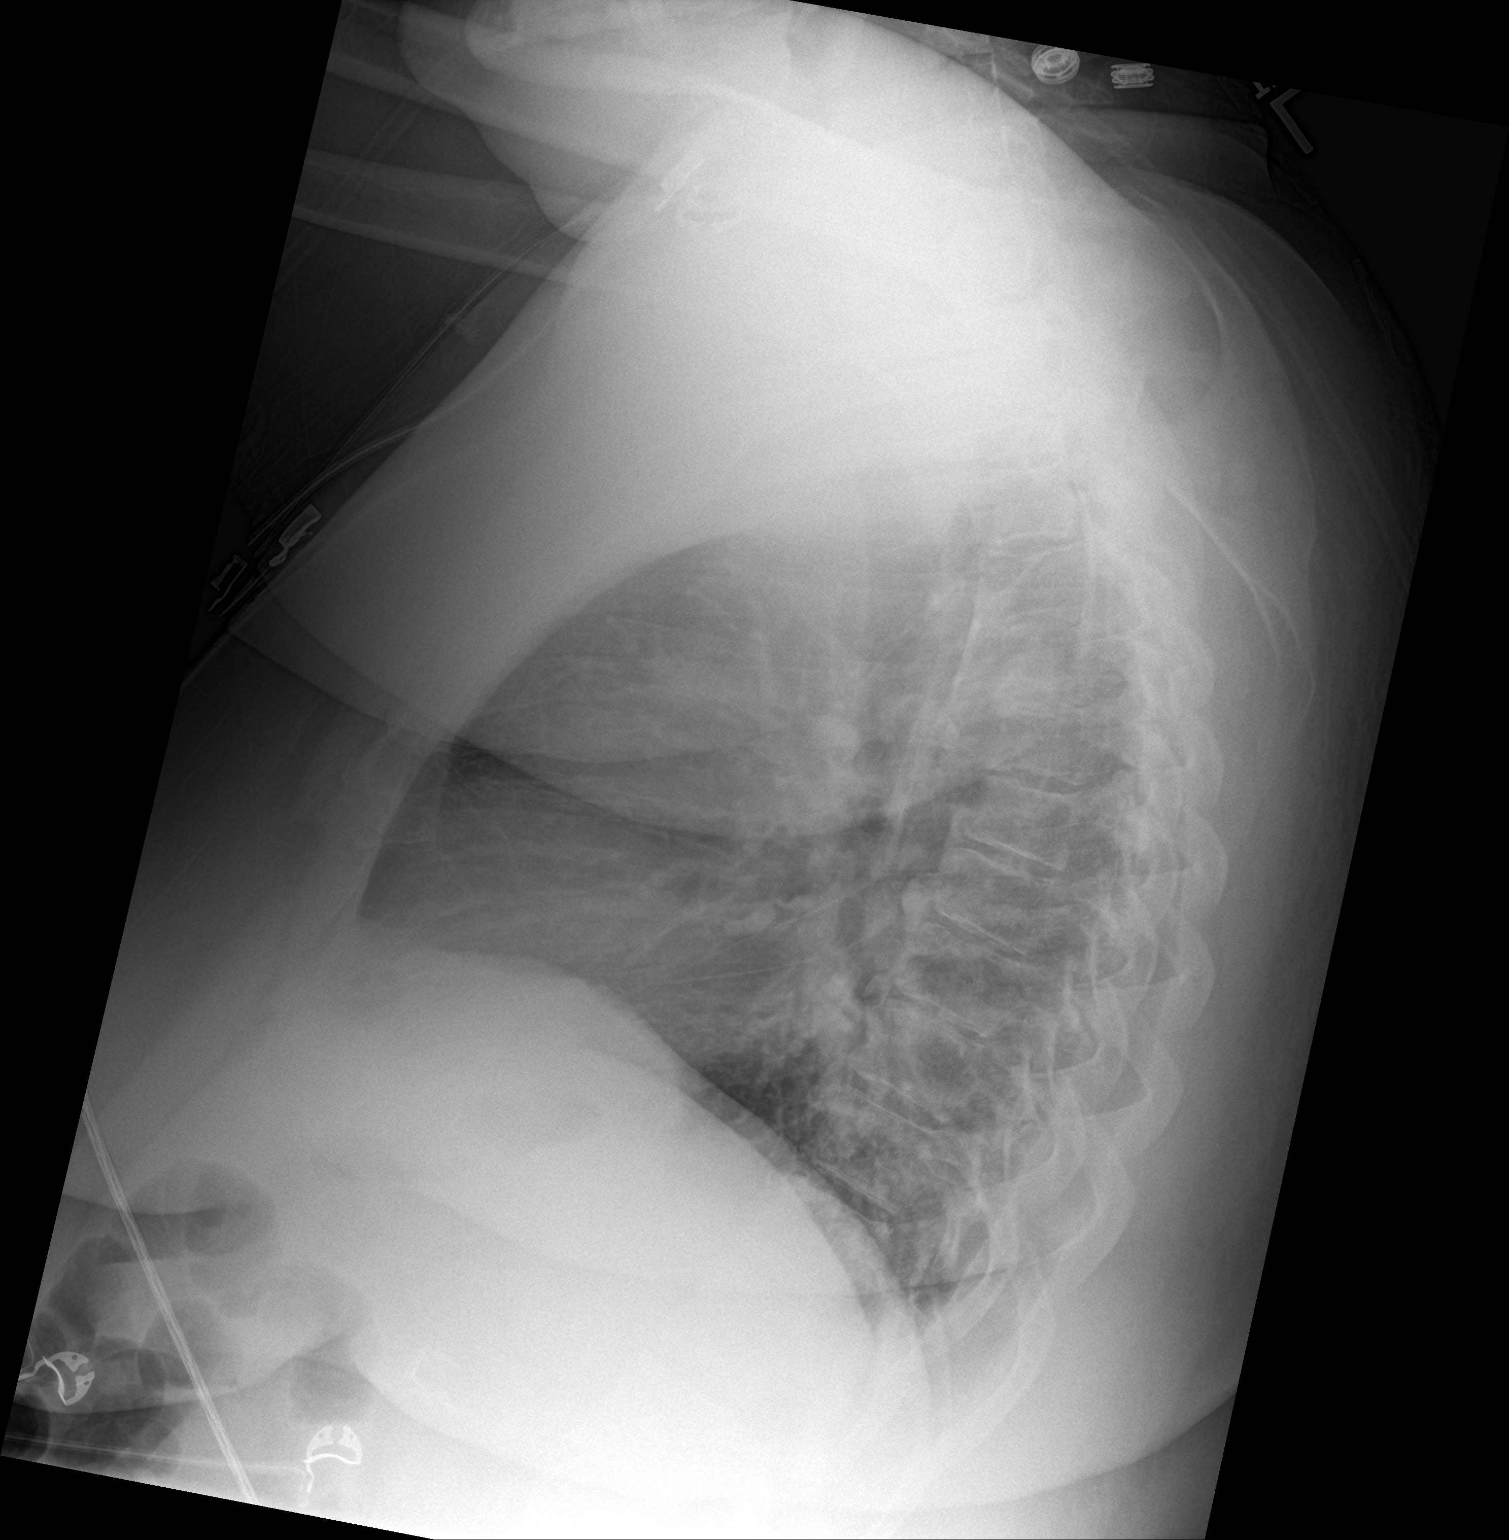

[2 of 2 positions shown; findings below may reference images not displayed]

FINDINGS: Lungs are clear. Heart is upper normal in size with pulmonary
vascularity normal. No adenopathy. No pneumothorax. No bone
lesions.
IMPRESSION: Lungs clear.  Heart upper normal in size.

## 2021-04-09 MED ORDER — FENTANYL CITRATE (PF) 100 MCG/2ML IJ SOLN
50.0000 ug | Freq: Once | INTRAMUSCULAR | Status: AC
  Administered 2021-04-09: 50 ug via INTRAVENOUS
  Filled 2021-04-09: qty 2

## 2021-04-09 MED ORDER — IBUPROFEN 600 MG PO TABS: 600.0000 mg | ORAL_TABLET | Freq: Four times a day (QID) | ORAL | 0 refills | Status: DC | PRN

## 2021-04-09 NOTE — ED Provider Notes (Signed)
[facility] EMERGENCY DEPARTMENT Provider Note  CSN: [id number] Arrival date & time: 04/09/21 0865    History Chief Complaint  Patient presents with   Chest Pain    [name] is a 50 y.o. female with prior history of NSTEMI (DES to LAD in Apr 2021, no other obstructive disease) reports she fell asleep in her recliner last night, woke up during the night with a choking sensation and began coughing. She reports she coughed so much she began to have chest pain, moderate to severe aching, midsternal, worse with cough, deep breath and moving. She also felt like she was having trouble catching her breath and was wheezing. No recent illness, has not been running a fever. She does not have history of asthma or COPD. She took one NTG with minimal relief.    Past Medical History:  Diagnosis Date   CAD (coronary artery disease), native coronary artery    s/p DES to LAD 02/01/20   Chest pain 01/2020   Facial numbness    Fibroid    Fx ankle    Hypertension    Obesity     Past Surgical History:  Procedure Laterality Date   APPENDECTOMY     APPLICATION OF CRANIAL NAVIGATION N/A 08/16/2019   Procedure: APPLICATION OF CRANIAL NAVIGATION;  Surgeon: Judith Part, MD;  Location: [place];  Service: Neurosurgery;  Laterality: N/A;   bilateral breast reduction  2004   CHOLECYSTECTOMY     CORONARY STENT INTERVENTION N/A 02/01/2020   Procedure: CORONARY STENT INTERVENTION;  Surgeon: Burnell Blanks, MD;  Location: [place] [facility];  Service: Cardiovascular;  Laterality: N/A;   CRANIOTOMY Left 08/16/2019   Procedure: Left craniotomy for tumor resection;  Surgeon: Judith Part, MD;  Location: [place];  Service: Neurosurgery;  Laterality: Left;  Left craniotomy for tumor resection   INTRAVASCULAR PRESSURE WIRE/FFR STUDY N/A 02/01/2020   Procedure: INTRAVASCULAR PRESSURE WIRE/FFR STUDY;  Surgeon: Burnell Blanks, MD;  Location: [place] [facility];  Service: Cardiovascular;   Laterality: N/A;   LEFT HEART CATH AND CORONARY ANGIOGRAPHY N/A 02/01/2020   Procedure: LEFT HEART CATH AND CORONARY ANGIOGRAPHY;  Surgeon: Burnell Blanks, MD;  Location: [place] [facility];  Service: Cardiovascular;  Laterality: N/A;   REDUCTION MAMMAPLASTY Bilateral    removal of left ovarian cyst  2003   TUBAL LIGATION  2011   Women's, removal of tubes 2013    Family History  Problem Relation Age of Onset   Coronary artery disease Mother    Hypertension Mother    Coronary artery disease Father    Kidney disease Father    Hypertension Father    Multiple sclerosis Sister    Hypertension Sister    Stroke Maternal Grandmother    Heart attack Maternal Grandfather    Anesthesia problems Neg Hx    Hypotension Neg Hx    Malignant hyperthermia Neg Hx    Pseudochol deficiency Neg Hx     Social History   Tobacco Use   Smoking status: Never   Smokeless tobacco: Never  Vaping Use   Vaping Use: Never used  Substance Use Topics   Alcohol use: Yes    Comment: rarely   Drug use: No     Home Medications Prior to Admission medications   Medication Sig Start Date End Date Taking? Authorizing Provider  acetaminophen (TYLENOL) 325 MG tablet Take 325 mg by mouth every 6 (six) hours as needed.   Yes [provider]  amLODipine (NORVASC) 10 MG tablet Take 1 tablet by mouth once daily Patient taking differently: Take 10 mg by mouth daily. 01/02/21  Yes Fayrene Helper, MD  aspirin 81 MG chewable tablet Chew 1 tablet (81 mg total) by mouth daily. 02/02/20  Yes Cheryln Manly, NP  atorvastatin (LIPITOR) 80 MG tablet Take 1 tablet (80 mg total) by mouth daily. 03/05/20  Yes Burnell Blanks, MD  cloNIDine (CATAPRES) 0.3 MG tablet TAKE 1 TABLET BY MOUTH AT BEDTIME Patient taking differently: Take 0.3 mg by mouth at bedtime. 03/20/21  Yes Fayrene Helper, MD  ibuprofen (ADVIL) 600 MG tablet Take 1 tablet (600 mg total) by mouth every 6 (six) hours as needed.  04/09/21  Yes Truddie Hidden, MD  labetalol (NORMODYNE) 200 MG tablet TAKE ONE TABLET BY MOUTH ONCE DAILY EVERY MORNING Patient taking differently: Take 200 mg by mouth daily. 03/20/21  Yes Fayrene Helper, MD  lisinopril (ZESTRIL) 10 MG tablet TAKE 1 TABLET BY MOUTH AT BEDTIME Patient taking differently: Take 10 mg by mouth daily. 01/13/20  Yes Fayrene Helper, MD  ticagrelor (BRILINTA) 90 MG TABS tablet Take 1 tablet (90 mg total) by mouth 2 (two) times daily. 03/20/21  Yes Burnell Blanks, MD  triamterene-hydrochlorothiazide (MAXZIDE) 75-50 MG tablet Take 1 tablet by mouth daily. 03/20/21  Yes Fayrene Helper, MD  nitroGLYCERIN (NITROSTAT) 0.4 MG SL tablet Place 1 tablet (0.4 mg total) under the tongue every 5 (five) minutes as needed. 02/20/20 03/28/21  Bhagat, Crista Luria, PA  polyethylene glycol-electrolytes (TRILYTE) 420 g solution Take 4,000 mLs by mouth as directed. 03/29/21   Harvel Quale, MD     Allergies    Patient has no known allergies.   Review of Systems   Review of Systems A comprehensive review of systems was completed and negative except as noted in HPI.    Physical Exam BP 133/86   Pulse 85   Temp 98.2 F (36.8 C) (Oral)   Resp 20   Ht 5\' 3"  (1.6 m)   Wt 127 kg   LMP  (Approximate) Comment: preg test neg  SpO2 100%   BMI 49.60 kg/m   Physical Exam Vitals and nursing note reviewed.  Constitutional:      Appearance: Normal appearance.  HENT:     Head: Normocephalic and atraumatic.     Nose: Nose normal.     Mouth/Throat:     Mouth: Mucous membranes are moist.  Eyes:     Extraocular Movements: Extraocular movements intact.     Conjunctiva/sclera: Conjunctivae normal.  Cardiovascular:     Rate and Rhythm: Normal rate.  Pulmonary:     Effort: Pulmonary effort is normal.     Breath sounds: Wheezing and rhonchi present.  Chest:     Chest wall: Tenderness (mid-sternal, reproduces pain) present.  Abdominal:     General: Abdomen  is flat.     Palpations: Abdomen is soft.     Tenderness: There is no abdominal tenderness.  Musculoskeletal:        General: No swelling. Normal range of motion.     Cervical back: Neck supple.  Skin:    General: Skin is warm and dry.  Neurological:     General: No focal deficit present.     Mental Status: She is alert.  Psychiatric:        Mood and Affect: Mood normal.     ED Results / Procedures / Treatments   Labs (all labs ordered  are listed, but only abnormal results are displayed) Labs Reviewed  BASIC METABOLIC PANEL - Abnormal; Notable for the following components:      Result Value   Potassium 3.2 (*)    Glucose, Bld 112 (*)    Calcium 8.6 (*)    All other components within normal limits  CBC  HCG, QUANTITATIVE, PREGNANCY  TROPONIN I (HIGH SENSITIVITY)  TROPONIN I (HIGH SENSITIVITY)    EKG EKG Interpretation  Date/Time:  Tuesday April 09 2021 06:26:05 EDT Ventricular Rate:  104 PR Interval:  190 QRS Duration: 86 QT Interval:  340 QTC Calculation: 448 R Axis:   56 Text Interpretation: Sinus tachycardia Consider left atrial enlargement Low voltage, precordial leads Abnormal R-wave progression, early transition Baseline wander in lead(s) II III aVF V2 V5 When compared with ECG of 02/02/2020, No significant change was found Confirmed by Delora Fuel ([id number]) on 04/09/2021 6:36:26 AM  Radiology DG Chest 2 View  Result Date: 04/09/2021 CLINICAL DATA:  Chest pain and shortness of breath EXAM: CHEST - 2 VIEW COMPARISON:  October 27, 2013 FINDINGS: Lungs are clear. Heart is upper normal in size with pulmonary vascularity normal. No adenopathy. No pneumothorax. No bone lesions. IMPRESSION: Lungs clear.  Heart upper normal in size. Electronically Signed   By: Lowella Grip III M.D.   On: 04/09/2021 08:01    Procedures Procedures  Medications Ordered in the ED Medications  fentaNYL (SUBLIMAZE) injection 50 mcg (50 mcg Intravenous Given 04/09/21 0741)     MDM  Rules/Calculators/A&P MDM Patient with history of CAD, now with chest pain after coughing. Reproducible on palpation. EKG initially tachycardic, improved with rest. Will check labs, CXR and reassess.   ED Course  I have reviewed the triage vital signs and the nursing notes.  Pertinent labs & imaging results that were available during my care of the patient were reviewed by me and considered in my medical decision making (see chart for details).  Clinical Course as of 04/09/21 1300  Tue Apr 09, 2021  0710 CBC is normal.  [CS]  0723 BMP and first Trop are unremarkable.  [CS]  6045 CXR is clear [CS]  0916 Pain improving after meds. Second Troponin is pending.  [CS]  0929 Trop remains normal. HEART Pathway score is 3, low risk. Plan discharge with Rx for motrin to take for pain. PCP follow up.  [CS]    Clinical Course User Index [CS] Truddie Hidden, MD    Final Clinical Impression(s) / ED Diagnoses Final diagnoses:  Chest wall pain    Rx / DC Orders ED Discharge Orders          Ordered    ibuprofen (ADVIL) 600 MG tablet  Every 6 hours PRN        04/09/21 0932             Truddie Hidden, MD 04/09/21 1300

## 2021-04-09 NOTE — ED Triage Notes (Signed)
Pt arrived via POV with chest pain 10/10 states it began after a cough et SOB from yesterday. Pt took 1 nitro pill approx 5 min ago.

## 2021-04-15 ENCOUNTER — Encounter: Payer: Self-pay | Admitting: Family Medicine

## 2021-04-16 ENCOUNTER — Other Ambulatory Visit: Payer: Self-pay | Admitting: *Deleted

## 2021-04-16 MED ORDER — ATORVASTATIN CALCIUM 80 MG PO TABS: 80.0000 mg | ORAL_TABLET | Freq: Every day | ORAL | 3 refills | Status: DC

## 2021-05-02 NOTE — Patient Instructions (Signed)
[name]  05/02/2021     @PREFPERIOPPHARMACY @   Your procedure is scheduled on  05/07/2021.   Report to [facility] at  [phone]  A.M.   Call this number if you have problems the morning of surgery:  [phone]   Remember:  Follow the diet and prep instructions given to you by the office.    Take these medicines the morning of surgery with A SIP OF WATER  amlodipine, labetolol.     Do not wear jewelry, make-up or nail polish.  Do not wear lotions, powders, or perfumes, or deodorant.  Do not shave 48 hours prior to surgery.  Men may shave face and neck.  Do not bring valuables to the hospital.  [facility] is not responsible for any belongings or valuables.  Contacts, dentures or bridgework may not be worn into surgery.  Leave your suitcase in the car.  After surgery it may be brought to your room.  For patients admitted to the hospital, discharge time will be determined by your treatment team.  Patients discharged the day of surgery will not be allowed to drive home and must  have someone with them for 24 hours.    Special instructions:    DO NOT smoke tobacco or vape for 24 hours before your procedure.  Please read over the following fact sheets that you were given. Anesthesia Post-op Instructions and Care and Recovery After Surgery      Colonoscopy, Adult, Care After This sheet gives you information about how to care for yourself after your procedure. Your health care provider may also give you more specific instructions. If you have problems or questions, contact your health careprovider. What can I expect after the procedure? After the procedure, it is common to have: A small amount of blood in your stool for 24 hours after the procedure. Some gas. Mild cramping or bloating of your abdomen. Follow these instructions at home: Eating and drinking  Drink enough fluid to keep your urine pale yellow. Follow instructions from your health care provider about  eating or drinking restrictions. Resume your normal diet as instructed by your health care provider. Avoid heavy or fried foods that are hard to digest.  Activity Rest as told by your health care provider. Avoid sitting for a long time without moving. Get up to take short walks every 1-2 hours. This is important to improve blood flow and breathing. Ask for help if you feel weak or unsteady. Return to your normal activities as told by your health care provider. Ask your health care provider what activities are safe for you. Managing cramping and bloating  Try walking around when you have cramps or feel bloated. Apply heat to your abdomen as told by your health care provider. Use the heat source that your health care provider recommends, such as a moist heat pack or a heating pad. Place a towel between your skin and the heat source. Leave the heat on for 20-30 minutes. Remove the heat if your skin turns bright red. This is especially important if you are unable to feel pain, heat, or cold. You may have a greater risk of getting burned.  General instructions If you were given a sedative during the procedure, it can affect you for several hours. Do not drive or operate machinery until your health care provider says that it is safe. For the first 24 hours after the procedure: Do not sign important documents. Do not drink  alcohol. Do your regular daily activities at a slower pace than normal. Eat soft foods that are easy to digest. Take over-the-counter and prescription medicines only as told by your health care provider. Keep all follow-up visits as told by your health care provider. This is important. Contact a health care provider if: You have blood in your stool 2-3 days after the procedure. Get help right away if you have: More than a small spotting of blood in your stool. Large blood clots in your stool. Swelling of your abdomen. Nausea or vomiting. A fever. Increasing pain in your  abdomen that is not relieved with medicine. Summary After the procedure, it is common to have a small amount of blood in your stool. You may also have mild cramping and bloating of your abdomen. If you were given a sedative during the procedure, it can affect you for several hours. Do not drive or operate machinery until your health care provider says that it is safe. Get help right away if you have a lot of blood in your stool, nausea or vomiting, a fever, or increased pain in your abdomen. This information is not intended to replace advice given to you by your health care provider. Make sure you discuss any questions you have with your healthcare provider. Document Revised: 09/23/2019 Document Reviewed: 04/25/2019 Elsevier Patient Education  [place] After This sheet gives you information about how to care for yourself after your procedure. Your health care provider may also give you more specific instructions. If you have problems or questions, contact your health careprovider. What can I expect after the procedure? After the procedure, it is common to have: Tiredness. Forgetfulness about what happened after the procedure. Impaired judgment for important decisions. Nausea or vomiting. Some difficulty with balance. Follow these instructions at home: For the time period you were told by your health care provider:     Rest as needed. Do not participate in activities where you could fall or become injured. Do not drive or use machinery. Do not drink alcohol. Do not take sleeping pills or medicines that cause drowsiness. Do not make important decisions or sign legal documents. Do not take care of children on your own. Eating and drinking Follow the diet that is recommended by your health care provider. Drink enough fluid to keep your urine pale yellow. If you vomit: Drink water, juice, or soup when you can drink without vomiting. Make sure  you have little or no nausea before eating solid foods. General instructions Have a responsible adult stay with you for the time you are told. It is important to have someone help care for you until you are awake and alert. Take over-the-counter and prescription medicines only as told by your health care provider. If you have sleep apnea, surgery and certain medicines can increase your risk for breathing problems. Follow instructions from your health care provider about wearing your sleep device: Anytime you are sleeping, including during daytime naps. While taking prescription pain medicines, sleeping medicines, or medicines that make you drowsy. Avoid smoking. Keep all follow-up visits as told by your health care provider. This is important. Contact a health care provider if: You keep feeling nauseous or you keep vomiting. You feel light-headed. You are still sleepy or having trouble with balance after 24 hours. You develop a rash. You have a fever. You have redness or swelling around the IV site. Get help right away if: You have trouble breathing. You have new-onset  confusion at home. Summary For several hours after your procedure, you may feel tired. You may also be forgetful and have poor judgment. Have a responsible adult stay with you for the time you are told. It is important to have someone help care for you until you are awake and alert. Rest as told. Do not drive or operate machinery. Do not drink alcohol or take sleeping pills. Get help right away if you have trouble breathing, or if you suddenly become confused. This information is not intended to replace advice given to you by your health care provider. Make sure you discuss any questions you have with your healthcare provider. Document Revised: 06/14/2020 Document Reviewed: 09/01/2019 Elsevier Patient Education  2022 [employer].

## 2021-05-02 NOTE — Pre-Procedure Instructions (Signed)
        RE: ED visit Received: Today Sheri Quale, MD  Encarnacion Chu, RN [id number], Michelene Gardener, CMA [place],  Okay to proceed, it seems that her pain was related to an episode of coughing she had.  Had negative work-up for any cardiac ischemia and chest x-ray was normal.  Thanks         Previous Messages    ----- Message -----  From: Encarnacion Chu, RN  Sent: 05/02/2021   8:27 AM EDT  To: Michelene Gardener [place], CMA, *  Subject: ED visit                                       Good morning! I am looking over [name]'s chart for her PAT on tomorrow. She came to the ED on 6/28 for CP. She does have an MI and CAD history. She is scheduled for follow up with Dr Moshe Cipro 05/07/2021 which is the day of her TCS. Will you look at her chart and see if you okay to proceed with TCS before she see's Dr Moshe Cipro? Thank you!

## 2021-05-03 ENCOUNTER — Encounter (HOSPITAL_COMMUNITY)
Admission: RE | Admit: 2021-05-03 | Discharge: 2021-05-03 | Disposition: A | Discharge status: Home / Self Care | Payer: BC Managed Care – PPO | Source: Ambulatory Visit | Attending: Gastroenterology | Admitting: Gastroenterology

## 2021-05-06 ENCOUNTER — Encounter (HOSPITAL_COMMUNITY)
Admission: RE | Admit: 2021-05-06 | Discharge: 2021-05-06 | Disposition: A | Discharge status: Home / Self Care | Payer: BC Managed Care – PPO | Source: Ambulatory Visit | Attending: Gastroenterology | Admitting: Gastroenterology

## 2021-05-06 ENCOUNTER — Encounter (HOSPITAL_COMMUNITY): Payer: Self-pay

## 2021-05-06 ENCOUNTER — Other Ambulatory Visit (INDEPENDENT_AMBULATORY_CARE_PROVIDER_SITE_OTHER): Payer: Self-pay | Admitting: *Deleted

## 2021-05-06 ENCOUNTER — Telehealth (INDEPENDENT_AMBULATORY_CARE_PROVIDER_SITE_OTHER): Payer: Self-pay | Admitting: *Deleted

## 2021-05-06 ENCOUNTER — Other Ambulatory Visit (INDEPENDENT_AMBULATORY_CARE_PROVIDER_SITE_OTHER): Payer: Self-pay | Admitting: Gastroenterology

## 2021-05-06 ENCOUNTER — Other Ambulatory Visit: Payer: Self-pay

## 2021-05-06 DIAGNOSIS — Z79899 Other long term (current) drug therapy: Secondary | ICD-10-CM | POA: Diagnosis not present

## 2021-05-06 DIAGNOSIS — Z955 Presence of coronary angioplasty implant and graft: Secondary | ICD-10-CM | POA: Diagnosis not present

## 2021-05-06 DIAGNOSIS — R7989 Other specified abnormal findings of blood chemistry: Secondary | ICD-10-CM

## 2021-05-06 DIAGNOSIS — Z7982 Long term (current) use of aspirin: Secondary | ICD-10-CM | POA: Diagnosis not present

## 2021-05-06 DIAGNOSIS — K648 Other hemorrhoids: Secondary | ICD-10-CM | POA: Diagnosis not present

## 2021-05-06 DIAGNOSIS — Z01812 Encounter for preprocedural laboratory examination: Secondary | ICD-10-CM | POA: Insufficient documentation

## 2021-05-06 DIAGNOSIS — K644 Residual hemorrhoidal skin tags: Secondary | ICD-10-CM | POA: Diagnosis not present

## 2021-05-06 DIAGNOSIS — D122 Benign neoplasm of ascending colon: Secondary | ICD-10-CM | POA: Diagnosis not present

## 2021-05-06 DIAGNOSIS — E876 Hypokalemia: Secondary | ICD-10-CM

## 2021-05-06 DIAGNOSIS — Z7901 Long term (current) use of anticoagulants: Secondary | ICD-10-CM | POA: Diagnosis not present

## 2021-05-06 DIAGNOSIS — Z1211 Encounter for screening for malignant neoplasm of colon: Secondary | ICD-10-CM | POA: Diagnosis not present

## 2021-05-06 DIAGNOSIS — I252 Old myocardial infarction: Secondary | ICD-10-CM | POA: Diagnosis not present

## 2021-05-06 DIAGNOSIS — K5730 Diverticulosis of large intestine without perforation or abscess without bleeding: Secondary | ICD-10-CM | POA: Diagnosis not present

## 2021-05-06 HISTORY — DX: Acute myocardial infarction, unspecified: I21.9

## 2021-05-06 HISTORY — DX: Other specified postprocedural states: R11.2

## 2021-05-06 HISTORY — DX: Other specified postprocedural states: Z98.890

## 2021-05-06 HISTORY — DX: Nausea with vomiting, unspecified: R11.2

## 2021-05-06 LAB — BASIC METABOLIC PANEL
Anion gap: 5 (ref 5–15)
BUN: 10 mg/dL (ref 6–20)
CO2: 25 mmol/L (ref 22–32)
Calcium: 8.4 mg/dL — ABNORMAL LOW (ref 8.9–10.3)
Chloride: 105 mmol/L (ref 98–111)
Creatinine, Ser: 0.59 mg/dL (ref 0.44–1.00)
GFR, Estimated: >60 mL/min (ref >60)
Glucose, Bld: 96 mg/dL (ref 70–99)
Potassium: 3 mmol/L — ABNORMAL LOW (ref 3.5–5.1)
Sodium: 135 mmol/L (ref 135–145)

## 2021-05-06 LAB — RAPID URINE DRUG SCREEN, HOSP PERFORMED
Amphetamines: NOT DETECTED
Barbiturates: NOT DETECTED
Benzodiazepines: NOT DETECTED
Cocaine: NOT DETECTED
Opiates: NOT DETECTED
Tetrahydrocannabinol: POSITIVE — AB

## 2021-05-06 LAB — HCG, SERUM, QUALITATIVE: Preg, Serum: NEGATIVE

## 2021-05-06 MED ORDER — POTASSIUM CHLORIDE CRYS ER 20 MEQ PO TBCR: 60.0000 meq | EXTENDED_RELEASE_TABLET | Freq: Every day | ORAL | 0 refills | Status: DC

## 2021-05-06 NOTE — Telephone Encounter (Signed)
Spoke with pharmacy, patient can take prescribed potassium as most recent was 3.0

## 2021-05-06 NOTE — Telephone Encounter (Signed)
Amanda from [facility] in [place] calling about drug interaction (hyperkalemia) between triamterene/hctz and potassium chloride that was sent in today. Pharm would like a call back if ok to fill. [phone]

## 2021-05-07 ENCOUNTER — Other Ambulatory Visit: Payer: Self-pay

## 2021-05-07 ENCOUNTER — Ambulatory Visit (HOSPITAL_COMMUNITY): Payer: BC Managed Care – PPO | Admitting: Certified Registered"

## 2021-05-07 ENCOUNTER — Encounter (HOSPITAL_COMMUNITY): Payer: Self-pay | Admitting: Gastroenterology

## 2021-05-07 ENCOUNTER — Encounter (HOSPITAL_COMMUNITY)
Admission: RE | Disposition: A | Discharge status: Home / Self Care | Payer: Self-pay | Source: Home / Self Care | Attending: Gastroenterology

## 2021-05-07 ENCOUNTER — Encounter: Payer: BC Managed Care – PPO | Admitting: Family Medicine

## 2021-05-07 ENCOUNTER — Ambulatory Visit (HOSPITAL_COMMUNITY)
Admission: RE | Admit: 2021-05-07 | Discharge: 2021-05-07 | Disposition: A | Discharge status: Home / Self Care | Payer: BC Managed Care – PPO | Attending: Gastroenterology | Admitting: Gastroenterology

## 2021-05-07 DIAGNOSIS — Z7901 Long term (current) use of anticoagulants: Secondary | ICD-10-CM | POA: Insufficient documentation

## 2021-05-07 DIAGNOSIS — K644 Residual hemorrhoidal skin tags: Secondary | ICD-10-CM

## 2021-05-07 DIAGNOSIS — Z7982 Long term (current) use of aspirin: Secondary | ICD-10-CM | POA: Diagnosis not present

## 2021-05-07 DIAGNOSIS — I2510 Atherosclerotic heart disease of native coronary artery without angina pectoris: Secondary | ICD-10-CM | POA: Diagnosis not present

## 2021-05-07 DIAGNOSIS — Z1211 Encounter for screening for malignant neoplasm of colon: Secondary | ICD-10-CM | POA: Diagnosis not present

## 2021-05-07 DIAGNOSIS — K648 Other hemorrhoids: Secondary | ICD-10-CM | POA: Diagnosis not present

## 2021-05-07 DIAGNOSIS — D122 Benign neoplasm of ascending colon: Secondary | ICD-10-CM | POA: Diagnosis not present

## 2021-05-07 DIAGNOSIS — K5730 Diverticulosis of large intestine without perforation or abscess without bleeding: Secondary | ICD-10-CM | POA: Diagnosis not present

## 2021-05-07 DIAGNOSIS — I252 Old myocardial infarction: Secondary | ICD-10-CM | POA: Diagnosis not present

## 2021-05-07 DIAGNOSIS — K635 Polyp of colon: Secondary | ICD-10-CM | POA: Diagnosis not present

## 2021-05-07 DIAGNOSIS — Z955 Presence of coronary angioplasty implant and graft: Secondary | ICD-10-CM | POA: Insufficient documentation

## 2021-05-07 DIAGNOSIS — Z79899 Other long term (current) drug therapy: Secondary | ICD-10-CM | POA: Diagnosis not present

## 2021-05-07 DIAGNOSIS — Z01812 Encounter for preprocedural laboratory examination: Secondary | ICD-10-CM

## 2021-05-07 HISTORY — PX: COLONOSCOPY WITH PROPOFOL: SHX5780

## 2021-05-07 HISTORY — PX: POLYPECTOMY: SHX5525

## 2021-05-07 LAB — POCT I-STAT, CHEM 8
BUN: 7 mg/dL (ref 6–20)
Calcium, Ion: 1.24 mmol/L (ref 1.15–1.40)
Chloride: 102 mmol/L (ref 98–111)
Creatinine, Ser: 0.6 mg/dL (ref 0.44–1.00)
Glucose, Bld: 99 mg/dL (ref 70–99)
HCT: 40 % (ref 36.0–46.0)
Hemoglobin: 13.6 g/dL (ref 12.0–15.0)
Potassium: 3.4 mmol/L — ABNORMAL LOW (ref 3.5–5.1)
Sodium: 138 mmol/L (ref 135–145)
TCO2: 25 mmol/L (ref 22–32)

## 2021-05-07 LAB — HM COLONOSCOPY

## 2021-05-07 SURGERY — COLONOSCOPY WITH PROPOFOL
Anesthesia: General

## 2021-05-07 MED ORDER — STERILE WATER FOR IRRIGATION IR SOLN
Status: DC | PRN
  Administered 2021-05-07: 200 mL

## 2021-05-07 MED ORDER — PROPOFOL 500 MG/50ML IV EMUL
INTRAVENOUS | Status: DC | PRN
  Administered 2021-05-07: 150 ug/kg/min via INTRAVENOUS

## 2021-05-07 MED ORDER — SODIUM CHLORIDE 0.9 % IV SOLN: INTRAVENOUS | Status: DC

## 2021-05-07 MED ORDER — LIDOCAINE HCL (CARDIAC) PF 100 MG/5ML IV SOSY
PREFILLED_SYRINGE | INTRAVENOUS | Status: DC | PRN
  Administered 2021-05-07: 50 mg via INTRAVENOUS

## 2021-05-07 MED ORDER — PROPOFOL 10 MG/ML IV BOLUS
INTRAVENOUS | Status: DC | PRN
  Administered 2021-05-07: 50 mg via INTRAVENOUS
  Administered 2021-05-07: 100 mg via INTRAVENOUS
  Administered 2021-05-07: 50 mg via INTRAVENOUS

## 2021-05-07 MED ORDER — LACTATED RINGERS IV SOLN: INTRAVENOUS | Status: DC

## 2021-05-07 MED ORDER — LACTATED RINGERS IV SOLN: INTRAVENOUS | Status: DC | PRN

## 2021-05-07 NOTE — Op Note (Signed)
[facility] Patient Name: [name] Procedure Date: 05/07/2021 7:07 AM MRN: [id number] Date of Birth: 1970-11-11 Attending MD: Maylon Peppers ,  CSN: [id number] Age: 50 Admit Type: Outpatient Procedure:                Colonoscopy Indications:              Screening for colorectal malignant neoplasm Providers:                Maylon Peppers, Lambert Mody Raphael Gibney,                            Technician Referring MD:              Medicines:                Monitored Anesthesia Care Complications:            No immediate complications. Estimated Blood Loss:     Estimated blood loss: none. Procedure:                Pre-Anesthesia Assessment:                           - Prior to the procedure, a History and Physical                            was performed, and patient medications, allergies                            and sensitivities were reviewed. The patient's                            tolerance of previous anesthesia was reviewed.                           - The risks and benefits of the procedure and the                            sedation options and risks were discussed with the                            patient. All questions were answered and informed                            consent was obtained.                           - ASA Grade Assessment: II - A patient with mild                            systemic disease.                           After obtaining informed consent, the colonoscope                            was passed under direct vision. Throughout the  procedure, the patient's blood pressure, pulse, and                            oxygen saturations were monitored continuously. The                            [id number] scope was introduced through                            the anus and advanced to the the terminal ileum.                            The colonoscopy was performed without difficulty.                             The patient tolerated the procedure well. The                            quality of the bowel preparation was adequate to                            identify polyps 6 mm and larger in size. Scope In: 7:34:50 AM Scope Out: 7:57:32 AM Scope Withdrawal Time: 0 hours 18 minutes 4 seconds  Total Procedure Duration: 0 hours 22 minutes 42 seconds  Findings:      Skin tags were found on perianal exam.      The terminal ileum appeared normal.      A 4 mm polyp was found in the proximal ascending colon. The polyp was       sessile. The polyp was removed with a cold snare. Resection and       retrieval were complete.      Multiple small and large-mouthed diverticula were found in the sigmoid       colon, descending colon and ascending colon.      Non-bleeding internal hemorrhoids were found during retroflexion. The       hemorrhoids were small. Impression:               - Perianal skin tags found on perianal exam.                           - The examined portion of the ileum was normal.                           - One 4 mm polyp in the proximal ascending colon,                            removed with a cold snare. Resected and retrieved.                           - Diverticulosis in the sigmoid colon, in the                            descending colon and in the ascending colon.                           -  Non-bleeding internal hemorrhoids. Moderate Sedation:      Per Anesthesia Care Recommendation:           - Discharge patient to home (ambulatory).                           - Resume previous diet.                           - Await pathology results.                           - Repeat colonoscopy for surveillance based on                            pathology results. Procedure Code(s):        --- Professional ---                           [id number], Colonoscopy, flexible; with removal of                            tumor(s), polyp(s), or other lesion(s) by snare                             technique Diagnosis Code(s):        --- Professional ---                           Z12.11, Encounter for screening for malignant                            neoplasm of colon                           K64.8, Other hemorrhoids                           K63.5, Polyp of colon                           K64.4, Residual hemorrhoidal skin tags                           K57.30, Diverticulosis of large intestine without                            perforation or abscess without bleeding CPT copyright 2019 American Medical Association. All rights reserved. The codes documented in this report are preliminary and upon coder review may  be revised to meet current compliance requirements. Maylon Peppers, MD Maylon Peppers,  05/07/2021 8:07:01 AM This report has been signed electronically. Number of Addenda: 0

## 2021-05-07 NOTE — Anesthesia Postprocedure Evaluation (Signed)
Anesthesia Post Note  Patient: [name]  Procedure(s) Performed: COLONOSCOPY WITH PROPOFOL POLYPECTOMY  Patient location during evaluation: Phase II Anesthesia Type: General Level of consciousness: awake Pain management: pain level controlled Vital Signs Assessment: post-procedure vital signs reviewed and stable Respiratory status: spontaneous breathing and respiratory function stable Cardiovascular status: blood pressure returned to baseline and stable Postop Assessment: no headache and no apparent nausea or vomiting Anesthetic complications: no Comments: Late entry   No notable events documented.   Last Vitals:  Vitals:   05/07/21 0716 05/07/21 0802  BP: (!) 123/91 103/67  Pulse: 79 81  Resp: 17 20  Temp: 36.6 C 36.7 C  SpO2: 98% 100%    Last Pain:  Vitals:   05/07/21 0802  TempSrc: Axillary  PainSc: 0-No pain                 Louann Sjogren

## 2021-05-07 NOTE — Discharge Instructions (Signed)
You are being discharged to home.  Resume your previous diet.  We are waiting for your pathology results.  Your physician has recommended a repeat colonoscopy for surveillance based on pathology results.  

## 2021-05-07 NOTE — Transfer of Care (Signed)
Immediate Anesthesia Transfer of Care Note  Patient: [name]  Procedure(s) Performed: COLONOSCOPY WITH PROPOFOL POLYPECTOMY  Patient Location: Short Stay  Anesthesia Type:General  Level of Consciousness: awake  Airway & Oxygen Therapy: Patient Spontanous Breathing  Post-op Assessment: Report given to RN, Post -op Vital signs reviewed and stable and Patient moving all extremities X 4  Post vital signs: Reviewed and stable  Last Vitals:  Vitals Value Taken Time  BP 103/67 05/07/21 0802  Temp 36.7 C 05/07/21 0802  Pulse 81 05/07/21 0802  Resp 20 05/07/21 0802  SpO2 100 % 05/07/21 0802    Last Pain:  Vitals:   05/07/21 0802  TempSrc: Axillary  PainSc: 0-No pain         Complications: No notable events documented.

## 2021-05-07 NOTE — H&P (Signed)
[name] is an 50 y.o. female.   Chief Complaint: Screening for colorectal cancer HPI: 50 y.o. female with PMH brain tumor s/p resection, MI s/p stent placement on DAPT (including Brillinta) who presents for colorectal cancer screening.   The patient has never had a colonoscopy in the past.  The patient denies having any complaints such as melena, hematochezia, abdominal pain or distention, change in her bowel movement consistency or frequency, no changes in her weight recently.  No family history of colorectal cancer.   Past Medical History:  Diagnosis Date   CAD (coronary artery disease), native coronary artery    s/p DES to LAD 02/01/20   Chest pain 01/2020   Facial numbness    Fibroid    Fx ankle    Hypertension    Myocardial infarction ([place])    Obesity    PONV (postoperative nausea and vomiting)     Past Surgical History:  Procedure Laterality Date   APPENDECTOMY     APPLICATION OF CRANIAL NAVIGATION N/A 08/16/2019   Procedure: APPLICATION OF CRANIAL NAVIGATION;  Surgeon: Judith Part, MD;  Location: [place];  Service: Neurosurgery;  Laterality: N/A;   bilateral breast reduction  2004   CHOLECYSTECTOMY     CORONARY STENT INTERVENTION N/A 02/01/2020   Procedure: CORONARY STENT INTERVENTION;  Surgeon: Burnell Blanks, MD;  Location: [place] [facility];  Service: Cardiovascular;  Laterality: N/A;   CRANIOTOMY Left 08/16/2019   Procedure: Left craniotomy for tumor resection;  Surgeon: Judith Part, MD;  Location: [place];  Service: Neurosurgery;  Laterality: Left;  Left craniotomy for tumor resection   INTRAVASCULAR PRESSURE WIRE/FFR STUDY N/A 02/01/2020   Procedure: INTRAVASCULAR PRESSURE WIRE/FFR STUDY;  Surgeon: Burnell Blanks, MD;  Location: [place] [facility];  Service: Cardiovascular;  Laterality: N/A;   LEFT HEART CATH AND CORONARY ANGIOGRAPHY N/A 02/01/2020   Procedure: LEFT HEART CATH AND CORONARY ANGIOGRAPHY;  Surgeon: Burnell Blanks, MD;  Location: [place] [facility];  Service: Cardiovascular;  Laterality: N/A;   REDUCTION MAMMAPLASTY Bilateral    removal of left ovarian cyst  2003   SALPINGECTOMY     TUBAL LIGATION  2011   Women's, removal of tubes 2013    Family History  Problem Relation Age of Onset   Coronary artery disease Mother    Hypertension Mother    Coronary artery disease Father    Kidney disease Father    Hypertension Father    Multiple sclerosis Sister    Hypertension Sister    Stroke Maternal Grandmother    Heart attack Maternal Grandfather    Anesthesia problems Neg Hx    Hypotension Neg Hx    Malignant hyperthermia Neg Hx    Pseudochol deficiency Neg Hx    Social History:  reports that she has never smoked. She has never used smokeless tobacco. She reports current alcohol use. She reports that she does not use drugs.  Allergies: No Known Allergies  Medications Prior to Admission  Medication Sig Dispense Refill   acetaminophen (TYLENOL) 325 MG tablet Take 325 mg by mouth every 6 (six) hours as needed.     amLODipine (NORVASC) 10 MG tablet Take 1 tablet by mouth once daily (Patient taking differently: Take 10 mg by mouth daily.) 90 tablet 0   aspirin 81 MG chewable tablet Chew 1 tablet (81 mg total) by mouth daily. 90 tablet 1   atorvastatin (LIPITOR) 80 MG tablet Take 1 tablet (80 mg total) by mouth daily.  90 tablet 3   cloNIDine (CATAPRES) 0.3 MG tablet TAKE 1 TABLET BY MOUTH AT BEDTIME (Patient taking differently: Take 0.3 mg by mouth at bedtime.) 90 tablet 0   ibuprofen (ADVIL) 600 MG tablet Take 1 tablet (600 mg total) by mouth every 6 (six) hours as needed. 30 tablet 0   labetalol (NORMODYNE) 200 MG tablet TAKE ONE TABLET BY MOUTH ONCE DAILY EVERY MORNING (Patient taking differently: Take 200 mg by mouth daily.) 90 tablet 3   lisinopril (ZESTRIL) 10 MG tablet TAKE 1 TABLET BY MOUTH AT BEDTIME (Patient taking differently: Take 10 mg by mouth daily.) 90 tablet 3   polyethylene  glycol-electrolytes (TRILYTE) 420 g solution Take 4,000 mLs by mouth as directed. 4000 mL 0   potassium chloride SA (KLOR-CON) 20 MEQ tablet Take 3 tablets (60 mEq total) by mouth daily for 2 days. 6 tablet 0   ticagrelor (BRILINTA) 90 MG TABS tablet Take 1 tablet (90 mg total) by mouth 2 (two) times daily. 180 tablet 1   triamterene-hydrochlorothiazide (MAXZIDE) 75-50 MG tablet Take 1 tablet by mouth daily. 90 tablet 1   nitroGLYCERIN (NITROSTAT) 0.4 MG SL tablet Place 1 tablet (0.4 mg total) under the tongue every 5 (five) minutes as needed. 25 tablet 3    Results for orders placed or performed during the hospital encounter of 05/07/21 (from the past 48 hour(s))  I-STAT, chem 8     Status: Abnormal   Collection Time: 05/07/21  7:22 AM  Result Value Ref Range   Sodium 138 135 - 145 mmol/L   Potassium 3.4 (L) 3.5 - 5.1 mmol/L   Chloride 102 98 - 111 mmol/L   BUN 7 6 - 20 mg/dL   Creatinine, Ser 0.60 0.44 - 1.00 mg/dL   Glucose, Bld 99 70 - 99 mg/dL    Comment: Glucose reference range applies only to samples taken after fasting for at least 8 hours.   Calcium, Ion 1.24 1.15 - 1.40 mmol/L   TCO2 25 22 - 32 mmol/L   Hemoglobin 13.6 12.0 - 15.0 g/dL   HCT 40.0 36.0 - 46.0 %   No results found.  Review of Systems  Constitutional: Negative.   HENT: Negative.    Eyes: Negative.   Respiratory: Negative.    Cardiovascular: Negative.   Gastrointestinal: Negative.   Endocrine: Negative.   Genitourinary: Negative.   Musculoskeletal: Negative.   Skin: Negative.   Allergic/Immunologic: Negative.   Neurological: Negative.   Hematological: Negative.   Psychiatric/Behavioral: Negative.     Blood pressure (!) 123/91, pulse 79, temperature 97.8 F (36.6 C), temperature source Oral, resp. rate 17, last menstrual period 05/02/2021, SpO2 98 %. Physical Exam  GENERAL: The patient is AO x3, in no acute distress. Obese.  HEENT: Head is normocephalic and atraumatic. EOMI are intact. Mouth is well  hydrated and without lesions. NECK: Supple. No masses LUNGS: Clear to auscultation. No presence of rhonchi/wheezing/rales. Adequate chest expansion HEART: RRR, normal s1 and s2. ABDOMEN: Soft, nontender, no guarding, no peritoneal signs, and nondistended. BS +. No masses. EXTREMITIES: Without any cyanosis, clubbing, rash, lesions or edema. NEUROLOGIC: AOx3, no focal motor deficit. SKIN: no jaundice, no rashes  Assessment/Plan 50 y.o. female with PMH brain tumor s/p resection, MI s/p stent placement on DAPT (including Brillinta) who presents for colorectal cancer screening. The patient is at average risk for colorectal cancer.  We will proceed with colonoscopy today.   Harvel Quale, MD 05/07/2021, 7:26 AM

## 2021-05-07 NOTE — Anesthesia Procedure Notes (Addendum)
Date/Time: 05/07/2021 7:35 AM Performed by: Orlie Dakin, CRNA Pre-anesthesia Checklist: Patient identified, Emergency Drugs available, Suction available and Patient being monitored Patient Re-evaluated:Patient Re-evaluated prior to induction Oxygen Delivery Method: Non-rebreather mask Induction Type: IV induction Placement Confirmation: positive ETCO2

## 2021-05-07 NOTE — Anesthesia Preprocedure Evaluation (Addendum)
Anesthesia Evaluation  Patient identified by MRN, date of birth, ID band Patient awake    Reviewed: Allergy & Precautions, H&P , NPO status , Patient's Chart, lab work & pertinent test results, reviewed documented beta blocker date and time   History of Anesthesia Complications (+) PONV and history of anesthetic complications  Airway Mallampati: II  TM Distance: >3 FB Neck ROM: full    Dental  (+) Chipped,    Pulmonary neg pulmonary ROS,    Pulmonary exam normal breath sounds clear to auscultation       Cardiovascular Exercise Tolerance: Good hypertension, + CAD and + Past MI   Rhythm:regular Rate:Normal     Neuro/Psych  Neuromuscular disease negative psych ROS   GI/Hepatic Neg liver ROS, GERD  Medicated,  Endo/Other  Morbid obesity  Renal/GU negative Renal ROS  negative genitourinary   Musculoskeletal   Abdominal   Peds  Hematology negative hematology ROS (+)   Anesthesia Other Findings   Reproductive/Obstetrics negative OB ROS                            Anesthesia Physical Anesthesia Plan  ASA: 3  Anesthesia Plan: General   Post-op Pain Management:    Induction:   PONV Risk Score and Plan: Propofol infusion  Airway Management Planned:   Additional Equipment:   Intra-op Plan:   Post-operative Plan:   Informed Consent: I have reviewed the patients History and Physical, chart, labs and discussed the procedure including the risks, benefits and alternatives for the proposed anesthesia with the patient or authorized representative who has indicated his/her understanding and acceptance.     Dental Advisory Given  Plan Discussed with: CRNA  Anesthesia Plan Comments:         Anesthesia Quick Evaluation

## 2021-05-08 LAB — SURGICAL PATHOLOGY

## 2021-05-09 ENCOUNTER — Encounter (HOSPITAL_COMMUNITY): Payer: Self-pay | Admitting: Gastroenterology

## 2021-05-09 ENCOUNTER — Encounter (INDEPENDENT_AMBULATORY_CARE_PROVIDER_SITE_OTHER): Payer: Self-pay | Admitting: *Deleted

## 2021-06-12 NOTE — Progress Notes (Signed)
Cardiology Office Note    Date:  06/19/2021   ID:  [name], DOB 11-05-1970, MRN [id number]   PCP:  Sheri Helper, MD   [place]  Cardiologist:  Sheri Chandler, MD   Advanced Practice Provider:  No care team member to display Electrophysiologist:  None   [phone]   Chief Complaint  Patient presents with   Follow-up     History of Present Illness:  [name] is a 50 y.o. female with history of CAD status post anterior STEMI 01/2020 treated with DES to the LAD, normal LVEF on echo, hypertension, obesity.  Patient last saw Dr. Angelena Form 06/29/2020 and she was doing well.  He had planned to see her back at the end of April and if she was doing well stop Brilinta at that visit.  Patient comes in for f/u. 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 Denies chest pain. Some DOE with heavy activity unchanged. BP usually runs 110/78. Up a little today but rushing to get here. Goes to the gym one day a week treadmill, elliptical. Lives on soft drinks caffeine free.     Past Medical History:  Diagnosis Date   CAD (coronary artery disease), native coronary artery    s/p DES to LAD 02/01/20   Chest pain 01/2020   Facial numbness    Fibroid    Fx ankle    Hypertension    Myocardial infarction ([place])    Obesity    PONV (postoperative nausea and vomiting)     Past Surgical History:  Procedure Laterality Date   APPENDECTOMY     APPLICATION OF CRANIAL NAVIGATION N/A 08/16/2019   Procedure: APPLICATION OF CRANIAL NAVIGATION;  Surgeon: Sheri Part, MD;  Location: [facility];  Service: Neurosurgery;  Laterality: N/A;   bilateral breast reduction  2004   CHOLECYSTECTOMY     COLONOSCOPY WITH PROPOFOL N/A 05/07/2021   Procedure: COLONOSCOPY WITH PROPOFOL;  Surgeon: Sheri Quale, MD;  [facility];  Service: Gastroenterology;  Laterality: N/A;  7:30   CORONARY STENT INTERVENTION N/A 02/01/2020    Procedure: CORONARY STENT INTERVENTION;  Surgeon: Sheri Blanks, MD;  Location: [facility];  Service: Cardiovascular;  Laterality: N/A;   CRANIOTOMY Left 08/16/2019   Procedure: Left craniotomy for tumor resection;  Surgeon: Sheri Part, MD;  Location: [facility];  Service: Neurosurgery;  Laterality: Left;  Left craniotomy for tumor resection   INTRAVASCULAR PRESSURE WIRE/FFR STUDY N/A 02/01/2020   Procedure: INTRAVASCULAR PRESSURE WIRE/FFR STUDY;  Surgeon: Sheri Blanks, MD;  Location: [facility];  Service: Cardiovascular;  Laterality: N/A;   LEFT HEART CATH AND CORONARY ANGIOGRAPHY N/A 02/01/2020   Procedure: LEFT HEART CATH AND CORONARY ANGIOGRAPHY;  Surgeon: Sheri Blanks, MD;  Location: [facility];  Service: Cardiovascular;  Laterality: N/A;   POLYPECTOMY  05/07/2021   Procedure: POLYPECTOMY;  Surgeon: [name], Sheri Quince, MD;  [facility];  Service: Gastroenterology;;   REDUCTION MAMMAPLASTY Bilateral    removal of left ovarian cyst  2003   SALPINGECTOMY     TUBAL LIGATION  2011   Women's, removal of tubes 2013    Current Medications: Current Meds  Medication Sig   acetaminophen (TYLENOL) 325 MG tablet Take 325 mg by mouth every 6 (six) hours as needed.   amLODipine (NORVASC) 10 MG tablet Take 1 tablet by mouth once daily (Patient taking differently: Take 10 mg by  mouth daily.)   aspirin 81 MG chewable tablet Chew 1 tablet (81 mg total) by mouth daily.   atorvastatin (LIPITOR) 80 MG tablet Take 1 tablet (80 mg total) by mouth daily.   cloNIDine (CATAPRES) 0.3 MG tablet TAKE 1 TABLET BY MOUTH AT BEDTIME (Patient taking differently: Take 0.3 mg by mouth at bedtime.)   ibuprofen (ADVIL) 600 MG tablet Take 1 tablet (600 mg total) by mouth every 6 (six) hours as needed.   labetalol (NORMODYNE) 200 MG tablet TAKE ONE TABLET BY MOUTH ONCE DAILY EVERY MORNING (Patient taking differently: Take 200 mg by mouth daily.)    lisinopril (ZESTRIL) 10 MG tablet TAKE 1 TABLET BY MOUTH AT BEDTIME (Patient taking differently: Take 10 mg by mouth daily.)   nitroGLYCERIN (NITROSTAT) 0.4 MG SL tablet Place 1 tablet (0.4 mg total) under the tongue every 5 (five) minutes as needed.   potassium chloride SA (KLOR-CON) 20 MEQ tablet Take 3 tablets (60 mEq total) by mouth daily for 2 days.   ticagrelor (BRILINTA) 90 MG TABS tablet Take 1 tablet (90 mg total) by mouth 2 (two) times daily.   triamterene-hydrochlorothiazide (MAXZIDE) 75-50 MG tablet Take 1 tablet by mouth daily.     Allergies:   Patient has no known allergies.   Social History   Socioeconomic History   Marital status: Single    Spouse name: Not on file   Number of children: 4   Years of education: Not on file   Highest education level: Associate degree: occupational, Hotel manager, or vocational program  Occupational History   Not on file  Tobacco Use   Smoking status: Never   Smokeless tobacco: Never  Vaping Use   Vaping Use: Never used  Substance and Sexual Activity   Alcohol use: Yes    Comment: rarely   Drug use: No   Sexual activity: Yes    Birth control/protection: Surgical  Other Topics Concern   Not on file  Social History Narrative   Lives with children   Caffeine- sodas, 48 oz daily   Social Determinants of Health   Financial Resource Strain: Not on file  Food Insecurity: Not on file  Transportation Needs: Not on file  Physical Activity: Not on file  Stress: Not on file  Social Connections: Not on file     Family History:  The patient's  family history includes Coronary artery disease in her father and mother; Heart attack in her maternal grandfather; Hypertension in her father, mother, and sister; Kidney disease in her father; Multiple sclerosis in her sister; Stroke in her maternal grandmother.   ROS:   Please see the history of present illness.    ROS All other systems reviewed and are negative.   PHYSICAL EXAM:   VS:  BP (!)  134/98   Pulse 97   Ht '5\' 3"'$  (1.6 m)   Wt 286 lb (129.7 kg)   SpO2 94%   BMI 50.66 kg/m   Physical Exam  GEN: Obese, in no acute distress  Neck: no JVD, carotid bruits, or masses Cardiac:RRR; no murmurs, rubs, or gallops  Respiratory:  clear to auscultation bilaterally, normal work of breathing GI: soft, nontender, nondistended, + BS Ext: without cyanosis, clubbing, or edema, Good distal pulses bilaterally Neuro:  Alert and Oriented x 3 Psych: euthymic mood, full affect  Wt Readings from Last 3 Encounters:  06/19/21 286 lb (129.7 kg)  06/14/21 279 lb (126.6 kg)  05/06/21 279 lb (126.6 kg)      Studies/Labs Reviewed:  EKG:  EKG is not ordered today.    Recent Labs: 10/18/2020: ALT 37; TSH 1.400 04/09/2021: Platelets 243 05/07/2021: BUN 7; Creatinine, Ser 0.60; Hemoglobin 13.6; Potassium 3.4; Sodium 138   Lipid Panel    Component Value Date/Time   CHOL 128 10/18/2020 0948   TRIG 57 10/18/2020 0948   HDL 56 10/18/2020 0948   CHOLHDL 2.3 10/18/2020 0948   CHOLHDL 2.9 02/02/2020 0830   VLDL 15 02/02/2020 0830   LDLCALC 60 10/18/2020 0948   LDLCALC 91 11/05/2017 0906    Additional studies/ records that were reviewed today include:  Echo April 2021:   1. Left ventricular ejection fraction, by estimation, is 55 to 60%. The  left ventricle has normal function. The left ventricle has no regional  wall motion abnormalities. There is moderate left ventricular hypertrophy.  Left ventricular diastolic  parameters are consistent with Grade I diastolic dysfunction (impaired  relaxation).   2. Right ventricular systolic function is normal. The right ventricular  size is normal. Tricuspid regurgitation signal is inadequate for assessing  PA pressure.   3. The mitral valve is grossly normal. No evidence of mitral valve  regurgitation. No evidence of mitral stenosis.   4. The aortic valve is tricuspid. Aortic valve regurgitation is not  visualized. No aortic stenosis is present.    5. The inferior vena cava is normal in size with greater than 50%  respiratory variability, suggesting right atrial pressure of 3 mmHg.      Risk Assessment/Calculations:         ASSESSMENT:    1. Coronary artery disease involving native coronary artery of native heart without angina pectoris   2. Essential hypertension   3. Hyperlipidemia, unspecified hyperlipidemia type   4. Morbid obesity ([place])      PLAN:  In order of problems listed above:  CAD status post anterior STEMI 01/2020 treated with DES to the LAD, normal LVEF on echo.  Dr. Angelena Form plan to stop Brilinta 01/2020 if she was doing well but she didn't come until today.  No chest pain or cardiac complaints.  Continue aspirin Lipitor amlodipine Zestril and Brilinta for now.  We will reach out to Dr. Angelena Form to see if he wants her to stop her Brilinta.  She is requesting follow-up in [place] next year as she lives there.  We will make arrangements.  Hypertension well controlled at home.  Up a little today but she was rushing to get here.  She says she checks it daily and it is always stable  HLD on Lipitor LDL 60-10/18/20  Obesity exercise and weight loss recommended.  Shared Decision Making/Informed Consent        Medication Adjustments/Labs and Tests Ordered: Current medicines are reviewed at length with the patient today.  Concerns regarding medicines are outlined above.  Medication changes, Labs and Tests ordered today are listed in the Patient Instructions below. There are no Patient Instructions on file for this visit.   Sumner Boast, PA-C  06/19/2021 1:15 PM    [facility] [address], [place], [place]  [address] Phone: [phone]; Fax: [phone]

## 2021-06-14 ENCOUNTER — Other Ambulatory Visit (HOSPITAL_COMMUNITY)
Admission: RE | Admit: 2021-06-14 | Discharge: 2021-06-14 | Disposition: A | Discharge status: Home / Self Care | Payer: BC Managed Care – PPO | Source: Ambulatory Visit | Attending: Family Medicine | Admitting: Family Medicine

## 2021-06-14 ENCOUNTER — Encounter: Payer: Self-pay | Admitting: Family Medicine

## 2021-06-14 ENCOUNTER — Ambulatory Visit (INDEPENDENT_AMBULATORY_CARE_PROVIDER_SITE_OTHER): Payer: BC Managed Care – PPO | Admitting: Family Medicine

## 2021-06-14 ENCOUNTER — Other Ambulatory Visit: Payer: Self-pay

## 2021-06-14 VITALS — BP 138/82 | HR 90 | Temp 98.7°F | Resp 20 | Ht 63.0 in | Wt 279.0 lb

## 2021-06-14 DIAGNOSIS — Z0000 Encounter for general adult medical examination without abnormal findings: Secondary | ICD-10-CM | POA: Diagnosis not present

## 2021-06-14 DIAGNOSIS — Z124 Encounter for screening for malignant neoplasm of cervix: Secondary | ICD-10-CM | POA: Diagnosis not present

## 2021-06-14 DIAGNOSIS — Z1231 Encounter for screening mammogram for malignant neoplasm of breast: Secondary | ICD-10-CM

## 2021-06-14 DIAGNOSIS — I10 Essential (primary) hypertension: Secondary | ICD-10-CM

## 2021-06-14 DIAGNOSIS — E785 Hyperlipidemia, unspecified: Secondary | ICD-10-CM

## 2021-06-14 DIAGNOSIS — Z23 Encounter for immunization: Secondary | ICD-10-CM

## 2021-06-14 NOTE — Patient Instructions (Addendum)
F/U mid December, call if you need me sooner  Flu vacine today  Please schedule mammogram at checkout  Needs fasting lipid, cmp and eGFR , please also have labs ordered by GI in June 2022  Pap sent   It is important that you exercise regularly at least 30 minutes 5 times a week. If you develop chest pain, have severe difficulty breathing, or feel very tired, stop exercising immediately and seek medical attention   Thanks for choosing [facility], we consider it a privelige to serve you.

## 2021-06-16 ENCOUNTER — Encounter: Payer: Self-pay | Admitting: Family Medicine

## 2021-06-16 NOTE — Assessment & Plan Note (Addendum)
Annual exam as documented. Counseling done  re healthy lifestyle involving commitment to 150 minutes exercise per week, heart healthy diet, and attaining healthy weight.The importance of adequate sleep also discussed.  Immunization and cancer screening needs are specifically addressed at this visit.  

## 2021-06-16 NOTE — Progress Notes (Signed)
    [name]     MRN: [id number]      DOB: [birth year]  HPI: Patient is in for annual physical exam. No other health concerns are expressed or addressed at the visit. Recent labs,  are reviewed. Immunization is reviewed , and  updated if needed.   PE: BP 138/82 (BP Location: Right Arm, Patient Position: Sitting, Cuff Size: Large)   Pulse 90   Temp 98.7 F (37.1 C)   Resp 20   Ht '5\' 3"'$  (1.6 m)   Wt 279 lb (126.6 kg)   SpO2 92%   BMI 49.42 kg/m   Pleasant  female, alert and oriented x 3, in no cardio-pulmonary distress. Afebrile. HEENT No facial trauma or asymetry. Sinuses non tender.  Extra occullar muscles intact.. External ears normal, . Neck: supple, no adenopathy,JVD or thyromegaly.No bruits.  Chest: Clear to ascultation bilaterally.No crackles or wheezes. Non tender to palpation  Breast: Not examined, pt declined, stating  mammogram up to date and had no concerns re pain or mass  Cardiovascular system; Heart sounds normal,  S1 and  S2 ,no S3.  No murmur, or thrill. Apical beat not displaced Peripheral pulses normal.  Abdomen: Soft, non tender, no organomegaly or masses. No bruits. Bowel sounds normal. No guarding, tenderness or rebound.   GU: External genitalia normal female genitalia , normal female distribution of hair. No lesions. Urethral meatus normal in size, no  Prolapse, no lesions visibly  Present. Bladder non tender. Vagina pink and moist , with no visible lesions , discharge present . Adequate pelvic support no  cystocele or rectocele noted Cervix pink and appears healthy, no lesions or ulcerations noted, no discharge noted from os Uterus normal size, no adnexal masses, no cervical motion or adnexal tenderness.   Musculoskeletal exam: Full ROM of spine, hips , shoulders and knees. No deformity ,swelling or crepitus noted. No muscle wasting or atrophy.   Neurologic: Cranial nerves 2 to 12 intact. Power, tone ,sensation and reflexes  normal throughout. No disturbance in gait. No tremor.  Skin: Intact, no ulceration, erythema , scaling or rash noted. Pigmentation normal throughout  Psych; Normal mood and affect. Judgement and concentration normal   Assessment & Plan:  Annual physical exam Annual exam as documented. Counseling done  re healthy lifestyle involving commitment to 150 minutes exercise per week, heart healthy diet, and attaining healthy weight.The importance of adequate sleep also discussed.  Immunization and cancer screening needs are specifically addressed at this visit.

## 2021-06-19 ENCOUNTER — Ambulatory Visit: Payer: BC Managed Care – PPO | Admitting: Physician Assistant

## 2021-06-19 ENCOUNTER — Encounter: Payer: Self-pay | Admitting: Physician Assistant

## 2021-06-19 ENCOUNTER — Other Ambulatory Visit: Payer: Self-pay

## 2021-06-19 VITALS — BP 134/98 | HR 97 | Ht 63.0 in | Wt 286.0 lb

## 2021-06-19 DIAGNOSIS — I2510 Atherosclerotic heart disease of native coronary artery without angina pectoris: Secondary | ICD-10-CM | POA: Diagnosis not present

## 2021-06-19 DIAGNOSIS — E785 Hyperlipidemia, unspecified: Secondary | ICD-10-CM | POA: Diagnosis not present

## 2021-06-19 DIAGNOSIS — I10 Essential (primary) hypertension: Secondary | ICD-10-CM | POA: Diagnosis not present

## 2021-06-19 MED ORDER — TICAGRELOR 90 MG PO TABS: 90.0000 mg | ORAL_TABLET | Freq: Two times a day (BID) | ORAL | 1 refills | Status: DC

## 2021-06-19 NOTE — Patient Instructions (Signed)
Medication Instructions:  Your physician recommends that you continue on your current medications as directed. Please refer to the Current Medication list given to you today.  *If you need a refill on your cardiac medications before your next appointment, please call your pharmacy*   Lab Work: NONE If you have labs (blood work) drawn today and your tests are completely normal, you will receive your results only by: [place] (if you have MyChart) OR A paper copy in the mail If you have any lab test that is abnormal or we need to change your treatment, we will call you to review the results.   Testing/Procedures: NONE   Follow-Up: At [facility], you and your health needs are our priority.  As part of our continuing mission to provide you with exceptional heart care, we have created designated Provider Care Teams.  These Care Teams include your primary Cardiologist (physician) and Advanced Practice Providers (APPs -  Physician Assistants and Nurse Practitioners) who all work together to provide you with the care you need, when you need it.  We recommend signing up for the patient portal called "MyChart".  Sign up information is provided on this After Visit Summary.  MyChart is used to connect with patients for Virtual Visits (Telemedicine).  Patients are able to view lab/test results, encounter notes, upcoming appointments, etc.  Non-urgent messages can be sent to your provider as well.   To learn more about what you can do with MyChart, go to [email or web address].    Your next appointment:   ESTABLISH A CARDIOLOGIST IN [place]   Other Instructions Exercising to Stay Healthy To become healthy and stay healthy, it is recommended that you do moderate-intensity and vigorous-intensity exercise. You can tell that you are exercising at a moderate intensity if your heart starts beating faster and you start breathing faster but can still hold a conversation. You can tell that you  are exercising at a vigorous intensity if you are breathing much harder and faster and cannot hold a conversation while exercising. How can exercise benefit me? Exercising regularly is important. It has many health benefits, such as: Improving overall fitness, flexibility, and endurance. Increasing bone density. Helping with weight control. Decreasing body fat. Increasing muscle strength and endurance. Reducing stress and tension, anxiety, depression, or anger. Improving overall health. What guidelines should I follow while exercising? Before you start a new exercise program, talk with your health care provider. Do not exercise so much that you hurt yourself, feel dizzy, or get very short of breath. Wear comfortable clothes and wear shoes with good support. Drink plenty of water while you exercise to prevent dehydration or heat stroke. Work out until your breathing and your heartbeat get faster (moderate intensity). How often should I exercise? Choose an activity that you enjoy, and set realistic goals. Your health care provider can help you make an activity plan that is individually designed and works best for you. Exercise regularly as told by your health care provider. This may include: Doing strength training two times a week, such as: Lifting weights. Using resistance bands. Push-ups. Sit-ups. Yoga. Doing a certain intensity of exercise for a given amount of time. Choose from these options: A total of 150 minutes of moderate-intensity exercise every week. A total of 75 minutes of vigorous-intensity exercise every week. A mix of moderate-intensity and vigorous-intensity exercise every week. Children, pregnant women, people who have not exercised regularly, people who are overweight, and older adults may need to talk with a health care  provider about what activities are safe to perform. If you have a medical condition, be sure to talk with your health care provider before you start a  new exercise program. What are some exercise ideas? Moderate-intensity exercise ideas include: Walking 1 mile (1.6 km) in about 15 minutes. Biking. Hiking. Golfing. Dancing. Water aerobics. Vigorous-intensity exercise ideas include: Walking 4.5 miles (7.2 km) or more in about 1 hour. Jogging or running 5 miles (8 km) in about 1 hour. Biking 10 miles (16.1 km) or more in about 1 hour. Lap swimming. Roller-skating or in-line skating. Cross-country skiing. Vigorous competitive sports, such as football, basketball, and soccer. Jumping rope. Aerobic dancing. What are some everyday activities that can help me get exercise? Yard work, such as: Psychologist, educational. Raking and bagging leaves. Washing your car. Pushing a stroller. Shoveling snow. Gardening. Washing windows or floors. How can I be more active in my day-to-day activities? Use stairs instead of an elevator. Take a walk during your lunch break. If you drive, park your car farther away from your work or school. If you take public transportation, get off one stop early and walk the rest of the way. Stand up or walk around during all of your indoor phone calls. Get up, stretch, and walk around every 30 minutes throughout the day. Enjoy exercise with a friend. Support to continue exercising will help you keep a regular routine of activity. Where to find more information You can find more information about exercising to stay healthy from: U.S. Department of Health and Human Services: [employer].at Centers for Disease Control and Prevention (CDC): http://www.wolf.info/ Summary Exercising regularly is important. It will improve your overall fitness, flexibility, and endurance. Regular exercise will also improve your overall health. It can help you control your weight, reduce stress, and improve your bone density. Do not exercise so much that you hurt yourself, feel dizzy, or get very short of breath. Before you start a new exercise  program, talk with your health care provider. This information is not intended to replace advice given to you by your health care provider. Make sure you discuss any questions you have with your health care provider. Document Revised: 01/25/2021 Document Reviewed: 01/25/2021 Elsevier Patient Education  [place].

## 2021-06-20 ENCOUNTER — Ambulatory Visit (HOSPITAL_COMMUNITY)
Admission: RE | Admit: 2021-06-20 | Discharge: 2021-06-20 | Disposition: A | Discharge status: Home / Self Care | Payer: BC Managed Care – PPO | Source: Ambulatory Visit | Attending: Family Medicine | Admitting: Family Medicine

## 2021-06-20 ENCOUNTER — Telehealth: Payer: Self-pay

## 2021-06-20 DIAGNOSIS — Z1231 Encounter for screening mammogram for malignant neoplasm of breast: Secondary | ICD-10-CM | POA: Insufficient documentation

## 2021-06-20 IMAGING — MG MM DIGITAL SCREENING BILAT W/ TOMO AND CAD
8 series · 8 of 24 positions shown · non-contrast
Comparison: Previous exam(s).

CLINICAL DATA: Screening.

EXAM:
DIGITAL SCREENING BILATERAL MAMMOGRAM WITH TOMOSYNTHESIS AND CAD
TECHNIQUE: Bilateral screening digital craniocaudal and mediolateral oblique
mammograms were obtained. Bilateral screening digital breast
tomosynthesis was performed. The images were evaluated with
computer-aided detection.

[L CC synth-2D]
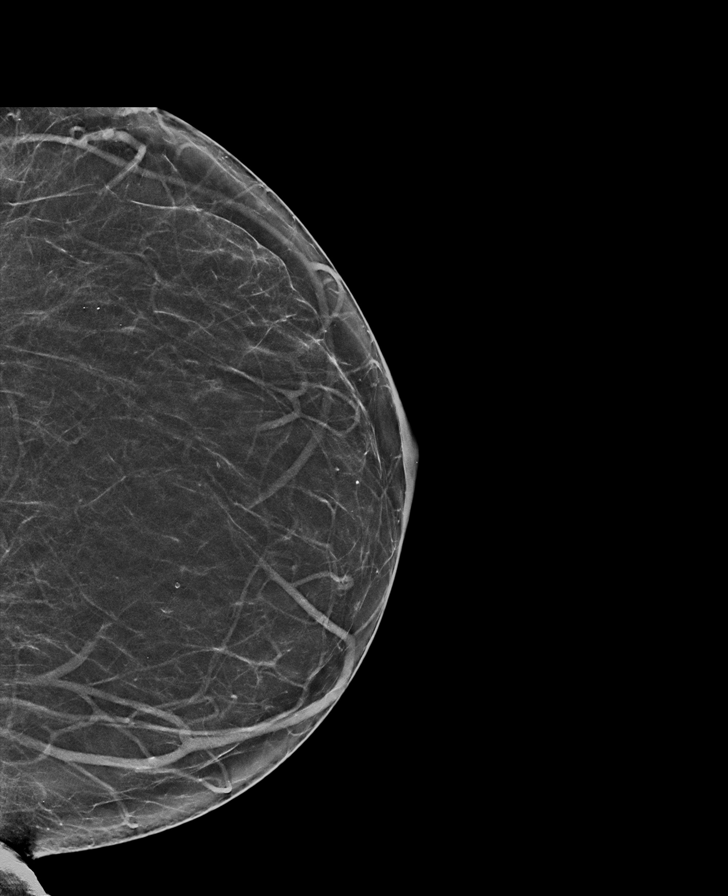

[L MLO synth-2D]
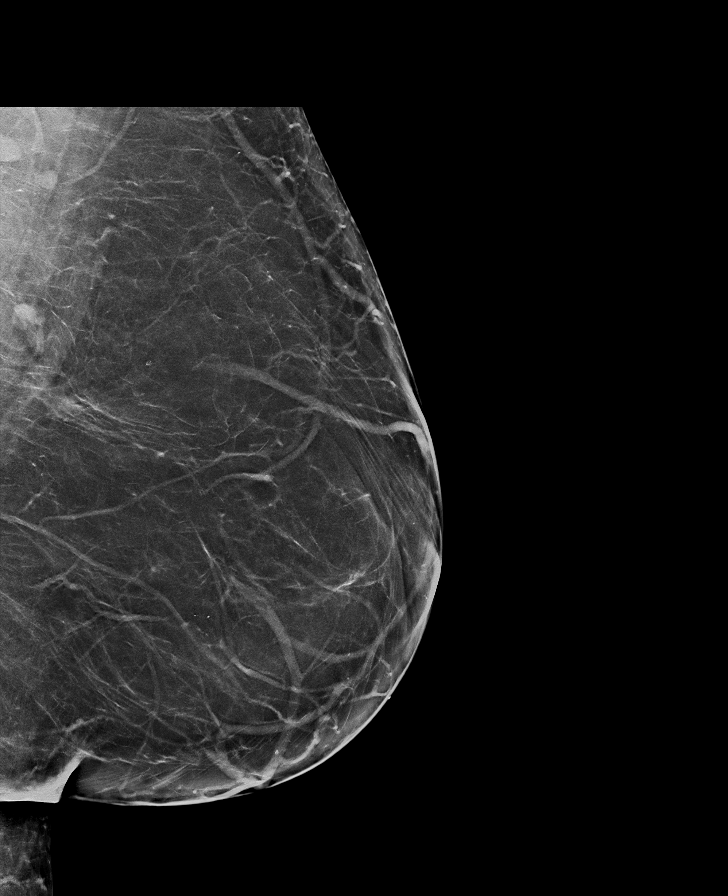

[R CC synth-2D]
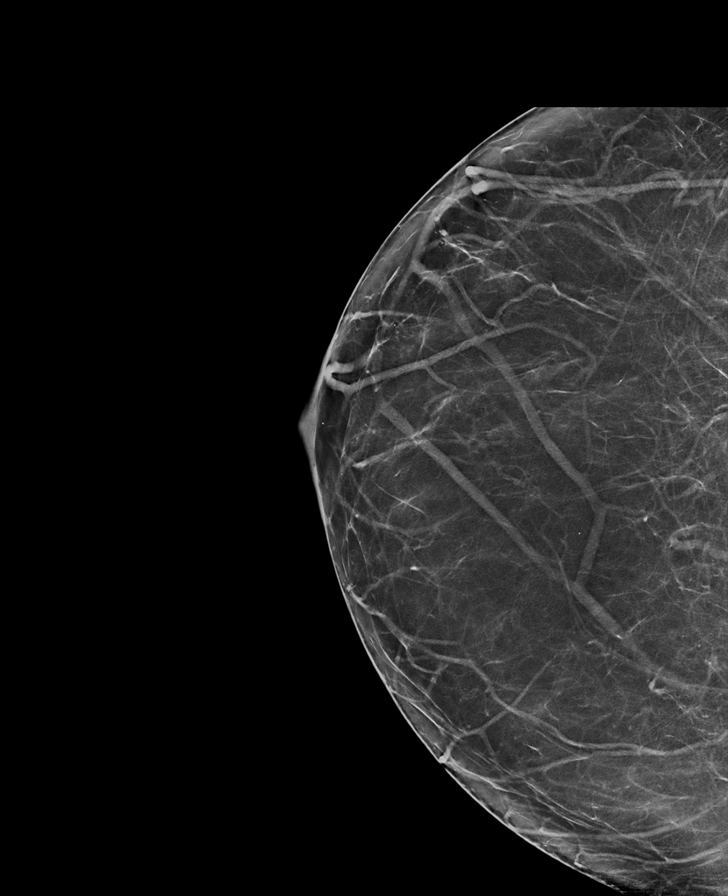

[R MLO synth-2D]
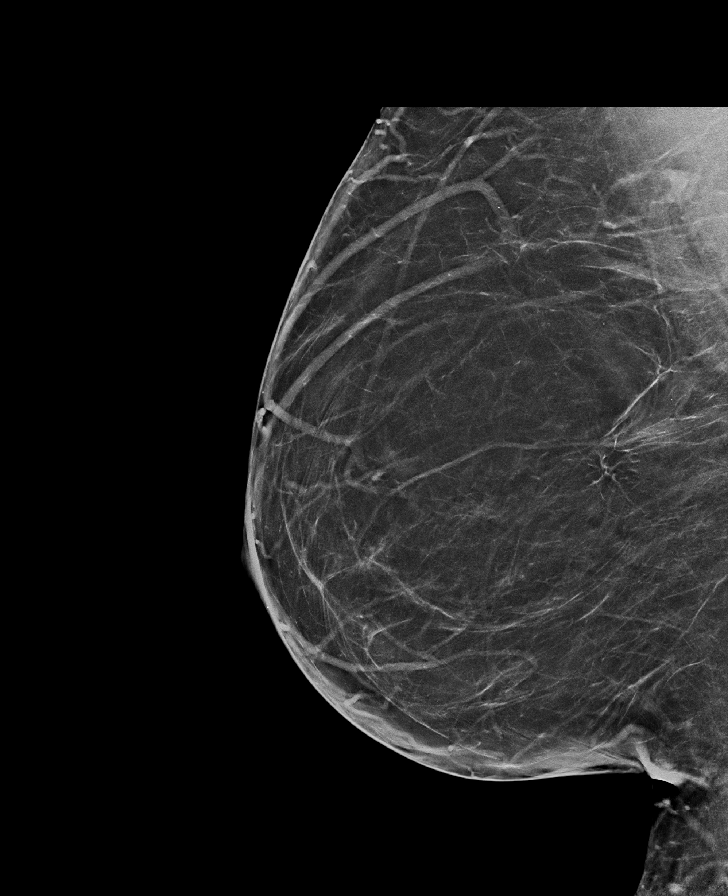

[R MLO tomo · tomo slice 39/77.0]
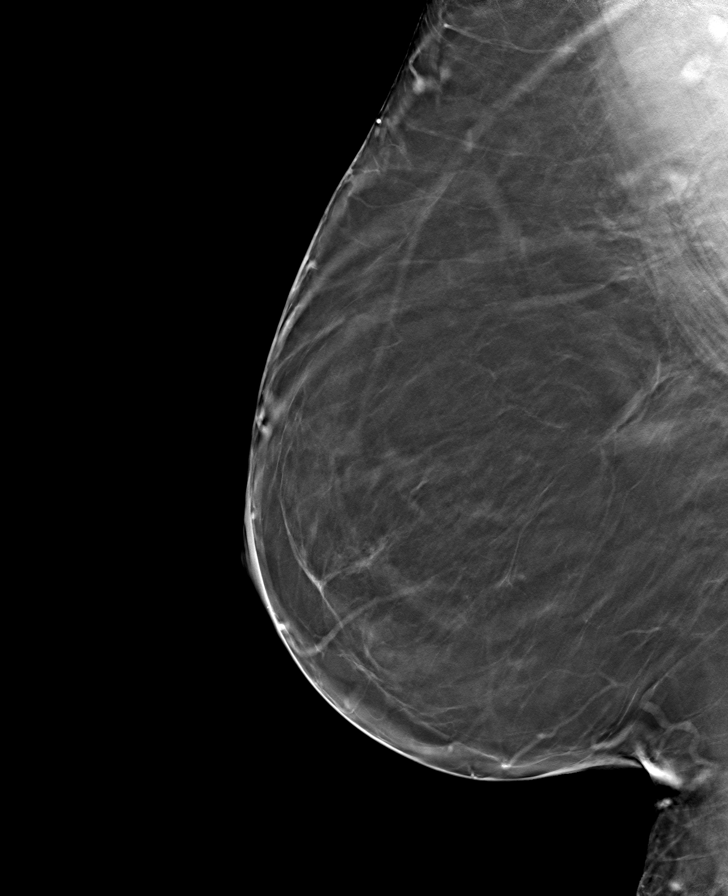

[L CC tomo · tomo slice 33/65.0]
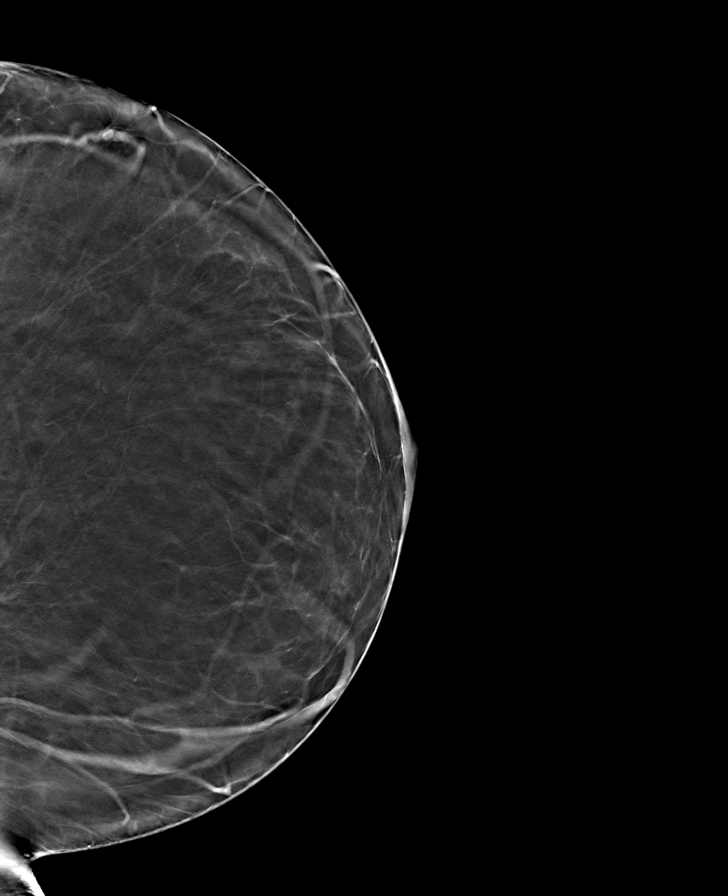

[L MLO tomo · tomo slice 40/79.0]
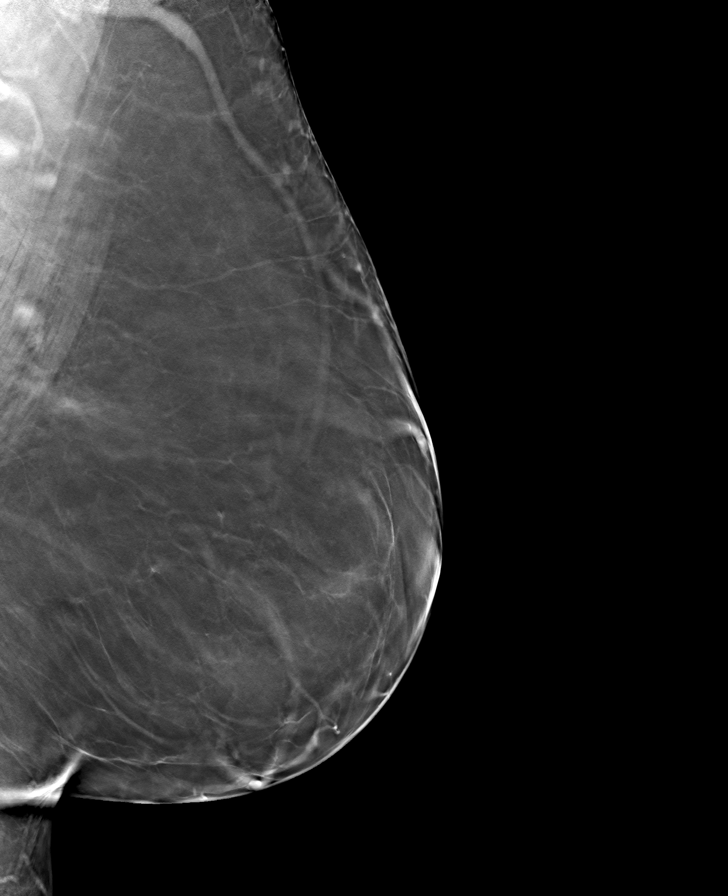

[R CC tomo · tomo slice 37/72.0]
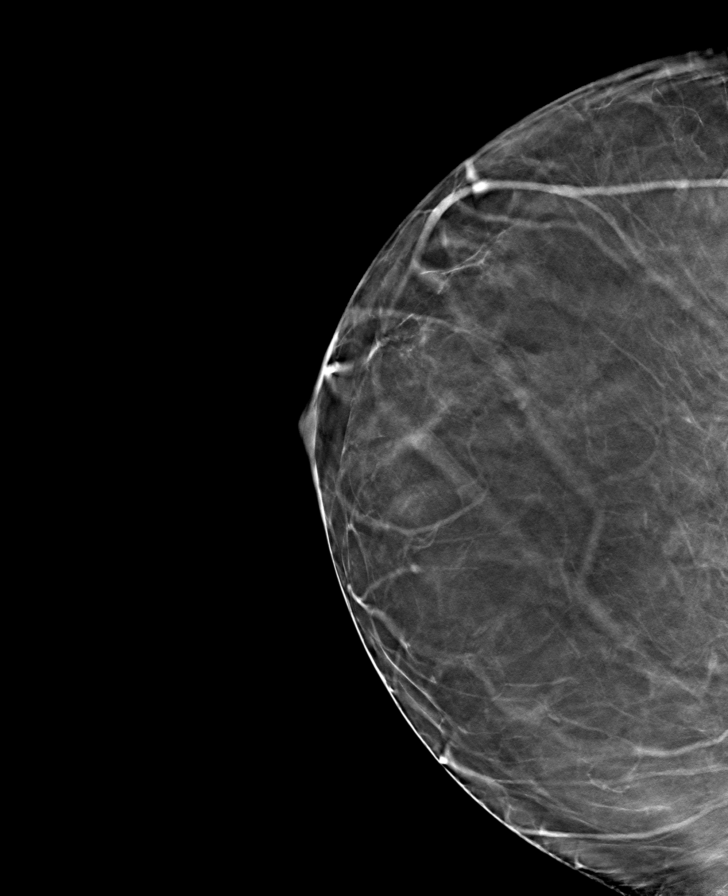

[8 of 24 positions shown; findings below may reference images not displayed]

ACR Breast Density Category b: There are scattered areas of
fibroglandular density.
FINDINGS: There are no findings suspicious for malignancy.
IMPRESSION: No mammographic evidence of malignancy. A result letter of this
screening mammogram will be mailed directly to the patient.

RECOMMENDATION:
Screening mammogram in one year. (Code:[BY])

BI-RADS CATEGORY  1: Negative.

## 2021-06-20 NOTE — Telephone Encounter (Signed)
Pt is aware to d/c Brilinta and to take ASA '81mg'$  daily.

## 2021-06-20 NOTE — Telephone Encounter (Signed)
-----   Message from Imogene Burn, PA-C sent at 06/20/2021  8:00 AM EDT ----- Can you let patient know Dr. Angelena Form said she can stop her Brilinta. Continue ASA 81 mg once daily. Thanks ----- Message ----- From: Burnell Blanks, MD Sent: 06/19/2021   4:05 PM EDT To: Imogene Burn, PA-C  Hey. OK to stop Brilinta. Thanks for seeing her. Gerald Stabs  ----- Message ----- From: Imogene Burn, PA-C Sent: 06/19/2021   1:15 PM EDT To: Burnell Blanks, MD  Dr. Angelena Form, you mentioned stopping this patients Brilinta but she didn't come in until today. Let me know if you want me to stop it. She's had no angina.

## 2021-06-21 LAB — CYTOLOGY - PAP
Comment: NEGATIVE
Diagnosis: NEGATIVE
Diagnosis: REACTIVE
High risk HPV: NEGATIVE

## 2021-06-26 DIAGNOSIS — I10 Essential (primary) hypertension: Secondary | ICD-10-CM | POA: Diagnosis not present

## 2021-06-26 DIAGNOSIS — E785 Hyperlipidemia, unspecified: Secondary | ICD-10-CM | POA: Diagnosis not present

## 2021-06-27 LAB — CMP14+EGFR
ALT: 25 IU/L (ref 0–32)
AST: 23 IU/L (ref 0–40)
Albumin/Globulin Ratio: 1.6 (ref 1.2–2.2)
Albumin: 4.2 g/dL (ref 3.8–4.8)
Alkaline Phosphatase: 184 IU/L — ABNORMAL HIGH (ref 44–121)
BUN/Creatinine Ratio: 23 (ref 9–23)
BUN: 17 mg/dL (ref 6–24)
Bilirubin Total: 1.2 mg/dL (ref 0.0–1.2)
CO2: 24 mmol/L (ref 20–29)
Calcium: 8.7 mg/dL (ref 8.7–10.2)
Chloride: 103 mmol/L (ref 96–106)
Creatinine, Ser: 0.75 mg/dL (ref 0.57–1.00)
Globulin, Total: 2.6 g/dL (ref 1.5–4.5)
Glucose: 89 mg/dL (ref 65–99)
Potassium: 4 mmol/L (ref 3.5–5.2)
Sodium: 141 mmol/L (ref 134–144)
Total Protein: 6.8 g/dL (ref 6.0–8.5)
eGFR: 98 mL/min/{1.73_m2} (ref >59)

## 2021-06-27 LAB — LIPID PANEL
Chol/HDL Ratio: 2.4 ratio (ref 0.0–4.4)
Cholesterol, Total: 141 mg/dL (ref 100–199)
HDL: 58 mg/dL (ref >39)
LDL Chol Calc (NIH): 68 mg/dL (ref 0–99)
Triglycerides: 74 mg/dL (ref 0–149)
VLDL Cholesterol Cal: 15 mg/dL (ref 5–40)

## 2021-08-13 ENCOUNTER — Encounter: Payer: Self-pay | Admitting: Family Medicine

## 2021-08-14 ENCOUNTER — Encounter: Payer: Self-pay | Admitting: Internal Medicine

## 2021-08-14 ENCOUNTER — Other Ambulatory Visit: Payer: Self-pay

## 2021-08-14 ENCOUNTER — Ambulatory Visit (INDEPENDENT_AMBULATORY_CARE_PROVIDER_SITE_OTHER): Payer: BC Managed Care – PPO | Admitting: Internal Medicine

## 2021-08-14 VITALS — Temp 104.0°F

## 2021-08-14 DIAGNOSIS — J111 Influenza due to unidentified influenza virus with other respiratory manifestations: Secondary | ICD-10-CM | POA: Diagnosis not present

## 2021-08-14 MED ORDER — OSELTAMIVIR PHOSPHATE 75 MG PO CAPS: 75.0000 mg | ORAL_CAPSULE | Freq: Two times a day (BID) | ORAL | 0 refills | Status: DC

## 2021-08-14 NOTE — Progress Notes (Signed)
Virtual Visit via Telephone Note   This visit type was conducted due to national recommendations for restrictions regarding the COVID-19 Pandemic (e.g. social distancing) in an effort to limit this patient's exposure and mitigate transmission in our community.  Due to her co-morbid illnesses, this patient is at least at moderate risk for complications without adequate follow up.  This format is felt to be most appropriate for this patient at this time.  The patient did not have access to video technology/had technical difficulties with video requiring transitioning to audio format only (telephone).  All issues noted in this document were discussed and addressed.  No physical exam could be performed with this format.  Evaluation Performed:  Follow-up visit  Date:  08/14/2021   ID:  [name], DOB [birth year], MRN [id number]  Patient Location: Home Provider Location: Office/Clinic  Participants: Patient Location of Patient: Home Location of Provider: Telehealth Consent was obtain for visit to be over via telehealth. I verified that I am speaking with the correct person using two identifiers.  PCP:  Fayrene Helper, MD   Chief Complaint: Fever, chills, fatigue and sore throat  History of Present Illness:    [name] is a 50 y.o. female who has a televisit for c/o fever, chills, fatigue, nausea and cough for last 2 days. Her daughter and some of the other family members tested positive for flu. She denies any dyspnea or wheezing. She has had influenza vaccine.  The patient does not have symptoms concerning for COVID-19 infection (fever, chills, cough, or new shortness of breath).   Past Medical, Surgical, Social History, Allergies, and Medications have been Reviewed.  Past Medical History:  Diagnosis Date   CAD (coronary artery disease), native coronary artery    s/p DES to LAD 02/01/20   Chest pain 01/2020   Facial numbness    Fibroid    Fx ankle    Hypertension     Myocardial infarction ([place])    Obesity    PONV (postoperative nausea and vomiting)    Past Surgical History:  Procedure Laterality Date   APPENDECTOMY     APPLICATION OF CRANIAL NAVIGATION N/A 08/16/2019   Procedure: APPLICATION OF CRANIAL NAVIGATION;  Surgeon: Judith Part, MD;  Location: [place];  Service: Neurosurgery;  Laterality: N/A;   bilateral breast reduction  2004   CHOLECYSTECTOMY     COLONOSCOPY WITH PROPOFOL N/A 05/07/2021   Procedure: COLONOSCOPY WITH PROPOFOL;  Surgeon: Harvel Quale, MD;  [facility];  Service: Gastroenterology;  Laterality: N/A;  7:30   CORONARY STENT INTERVENTION N/A 02/01/2020   Procedure: CORONARY STENT INTERVENTION;  Surgeon: Burnell Blanks, MD;  Location: [place] [facility];  Service: Cardiovascular;  Laterality: N/A;   CRANIOTOMY Left 08/16/2019   Procedure: Left craniotomy for tumor resection;  Surgeon: Judith Part, MD;  Location: [place];  Service: Neurosurgery;  Laterality: Left;  Left craniotomy for tumor resection   INTRAVASCULAR PRESSURE WIRE/FFR STUDY N/A 02/01/2020   Procedure: INTRAVASCULAR PRESSURE WIRE/FFR STUDY;  Surgeon: Burnell Blanks, MD;  Location: [place] [facility];  Service: Cardiovascular;  Laterality: N/A;   LEFT HEART CATH AND CORONARY ANGIOGRAPHY N/A 02/01/2020   Procedure: LEFT HEART CATH AND CORONARY ANGIOGRAPHY;  Surgeon: Burnell Blanks, MD;  Location: [facility];  Service: Cardiovascular;  Laterality: N/A;   POLYPECTOMY  05/07/2021   Procedure: POLYPECTOMY;  Surgeon: Harvel Quale, MD;  [facility];  Service: Gastroenterology;;  REDUCTION MAMMAPLASTY Bilateral    removal of left ovarian cyst  2003   SALPINGECTOMY     TUBAL LIGATION  2011   Women's, removal of tubes 2013     Current Meds  Medication Sig   acetaminophen (TYLENOL) 325 MG tablet Take 325 mg by mouth every 6 (six) hours as needed.   amLODipine (NORVASC) 10 MG  tablet Take 1 tablet by mouth once daily (Patient taking differently: Take 10 mg by mouth daily.)   aspirin 81 MG chewable tablet Chew 1 tablet (81 mg total) by mouth daily.   atorvastatin (LIPITOR) 80 MG tablet Take 1 tablet (80 mg total) by mouth daily.   cloNIDine (CATAPRES) 0.3 MG tablet TAKE 1 TABLET BY MOUTH AT BEDTIME (Patient taking differently: Take 0.3 mg by mouth at bedtime.)   ibuprofen (ADVIL) 600 MG tablet Take 1 tablet (600 mg total) by mouth every 6 (six) hours as needed.   labetalol (NORMODYNE) 200 MG tablet TAKE ONE TABLET BY MOUTH ONCE DAILY EVERY MORNING (Patient taking differently: Take 200 mg by mouth daily.)   lisinopril (ZESTRIL) 10 MG tablet TAKE 1 TABLET BY MOUTH AT BEDTIME (Patient taking differently: Take 10 mg by mouth daily.)   triamterene-hydrochlorothiazide (MAXZIDE) 75-50 MG tablet Take 1 tablet by mouth daily.     Allergies:   Patient has no known allergies.   ROS:   Please see the history of present illness.     All other systems reviewed and are negative.   Labs/Other Tests and Data Reviewed:    Recent Labs: 10/18/2020: TSH 1.400 04/09/2021: Platelets 243 05/07/2021: Hemoglobin 13.6 06/26/2021: ALT 25; BUN 17; Creatinine, Ser 0.75; Potassium 4.0; Sodium 141   Recent Lipid Panel Lab Results  Component Value Date/Time   CHOL 141 06/26/2021 09:16 AM   TRIG 74 06/26/2021 09:16 AM   HDL 58 06/26/2021 09:16 AM   CHOLHDL 2.4 06/26/2021 09:16 AM   CHOLHDL 2.9 02/02/2020 08:30 AM   LDLCALC 68 06/26/2021 09:16 AM   LDLCALC 91 11/05/2017 09:06 AM    Wt Readings from Last 3 Encounters:  06/19/21 286 lb (129.7 kg)  06/14/21 279 lb (126.6 kg)  05/06/21 279 lb (126.6 kg)     ASSESSMENT & PLAN:    Influenza infection Has sick contacts with positive flu test Started Tamiflu Continue symptomatic treatment with Mucinex or Robitussin  Time:   Today, I have spent 9 minutes reviewing the chart, including problem list, medications, and with the patient with  telehealth technology discussing the above problems.   Medication Adjustments/Labs and Tests Ordered: Current medicines are reviewed at length with the patient today.  Concerns regarding medicines are outlined above.   Tests Ordered: No orders of the defined types were placed in this encounter.   Medication Changes: No orders of the defined types were placed in this encounter.    Note: This dictation was prepared with Dragon dictation along with smaller phrase technology. Similar sounding words can be transcribed inadequately or may not be corrected upon review. Any transcriptional errors that result from this process are unintentional.      Disposition:  Follow up  Signed, Lindell Spar, MD  08/14/2021 2:11 PM     [facility]

## 2021-09-15 ENCOUNTER — Other Ambulatory Visit: Payer: Self-pay | Admitting: Family Medicine

## 2021-09-15 DIAGNOSIS — I10 Essential (primary) hypertension: Secondary | ICD-10-CM

## 2021-09-16 MED ORDER — CLONIDINE HCL 0.3 MG PO TABS: ORAL_TABLET | ORAL | 0 refills | Status: DC

## 2021-09-16 MED ORDER — AMLODIPINE BESYLATE 10 MG PO TABS: 10.0000 mg | ORAL_TABLET | Freq: Every day | ORAL | 0 refills | Status: DC

## 2021-10-11 ENCOUNTER — Ambulatory Visit: Payer: BC Managed Care – PPO | Admitting: Family Medicine

## 2021-10-30 ENCOUNTER — Ambulatory Visit (HOSPITAL_COMMUNITY)
Admission: RE | Admit: 2021-10-30 | Discharge: 2021-10-30 | Disposition: A | Discharge status: Home / Self Care | Payer: BC Managed Care – PPO | Source: Ambulatory Visit | Attending: Emergency Medicine | Admitting: Emergency Medicine

## 2021-10-30 ENCOUNTER — Other Ambulatory Visit (HOSPITAL_COMMUNITY): Payer: Self-pay | Admitting: Emergency Medicine

## 2021-10-30 ENCOUNTER — Encounter (HOSPITAL_COMMUNITY): Payer: Self-pay | Admitting: Radiology

## 2021-10-30 ENCOUNTER — Other Ambulatory Visit: Payer: Self-pay

## 2021-10-30 DIAGNOSIS — Z743 Need for continuous supervision: Secondary | ICD-10-CM | POA: Diagnosis not present

## 2021-10-30 DIAGNOSIS — R279 Unspecified lack of coordination: Secondary | ICD-10-CM | POA: Diagnosis not present

## 2021-10-30 DIAGNOSIS — D259 Leiomyoma of uterus, unspecified: Secondary | ICD-10-CM | POA: Diagnosis not present

## 2021-10-30 DIAGNOSIS — K625 Hemorrhage of anus and rectum: Secondary | ICD-10-CM | POA: Insufficient documentation

## 2021-10-30 DIAGNOSIS — R1030 Lower abdominal pain, unspecified: Secondary | ICD-10-CM | POA: Diagnosis not present

## 2021-10-30 DIAGNOSIS — R197 Diarrhea, unspecified: Secondary | ICD-10-CM | POA: Diagnosis not present

## 2021-10-30 DIAGNOSIS — I10 Essential (primary) hypertension: Secondary | ICD-10-CM | POA: Diagnosis not present

## 2021-10-30 DIAGNOSIS — R109 Unspecified abdominal pain: Secondary | ICD-10-CM | POA: Diagnosis not present

## 2021-10-30 LAB — POCT I-STAT CREATININE: Creatinine, Ser: 0.5 mg/dL (ref 0.44–1.00)

## 2021-10-30 IMAGING — CT CT ABD-PELV W/ CM
2 of 5 series · 16 of 46 positions shown, 18 images · IV contrast (Omnipaque or Isovue)
Comparison: Ultrasound abdomen [DATE]. CT abdomen and pelvis
[DATE].

CLINICAL DATA: Lower abdominal pain with rectal bleeding.

EXAM:
CT ABDOMEN AND PELVIS WITH CONTRAST
TECHNIQUE: Multidetector CT imaging of the abdomen and pelvis was performed
using the standard protocol following bolus administration of
intravenous contrast.

[Series 2: axial st · axial · 0.82mm/px · z∈[+1024,+1434]mm · 13 of 92 slices shown, 15 images]
[im 5/92  soft-tissue]
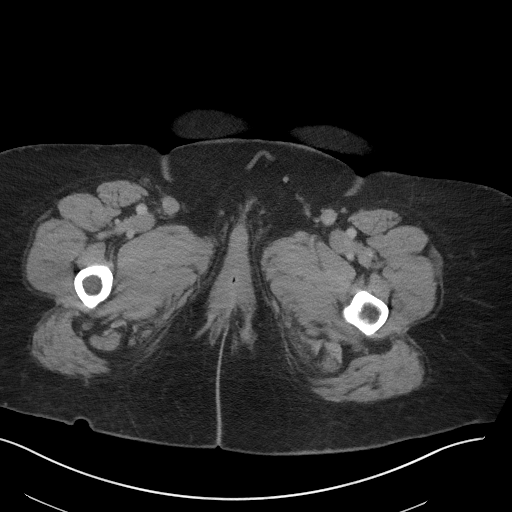
[im 5/92  bone]
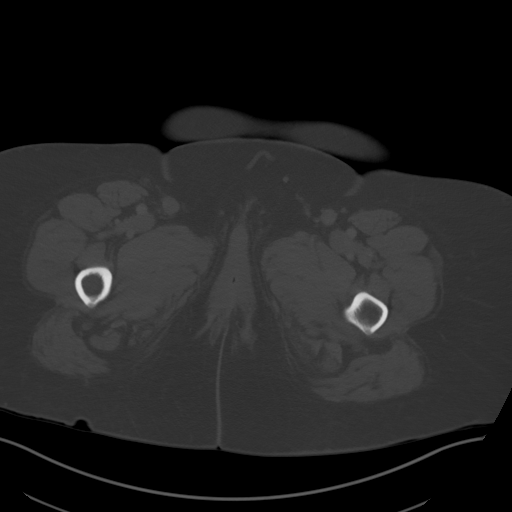
[im 15/92  soft-tissue]
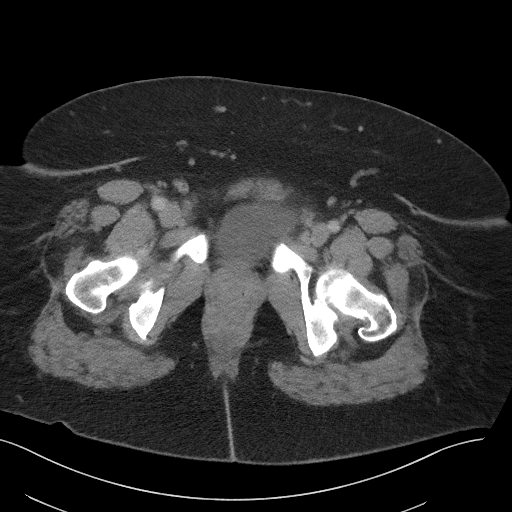
[im 20/92  soft-tissue]
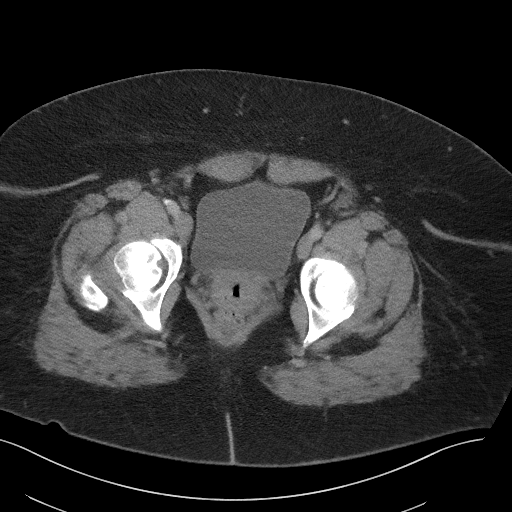
[im 24/92  soft-tissue]
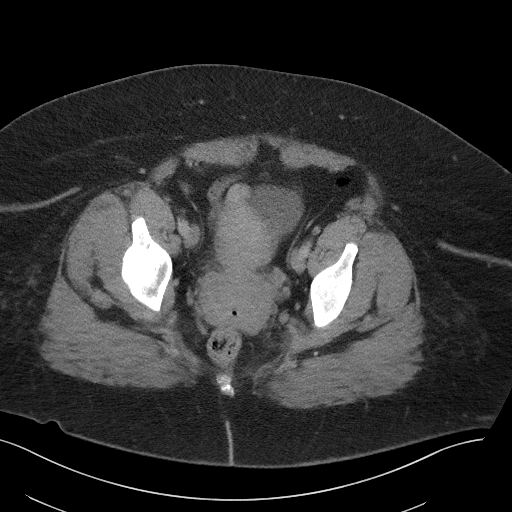
[im 34/92  soft-tissue]
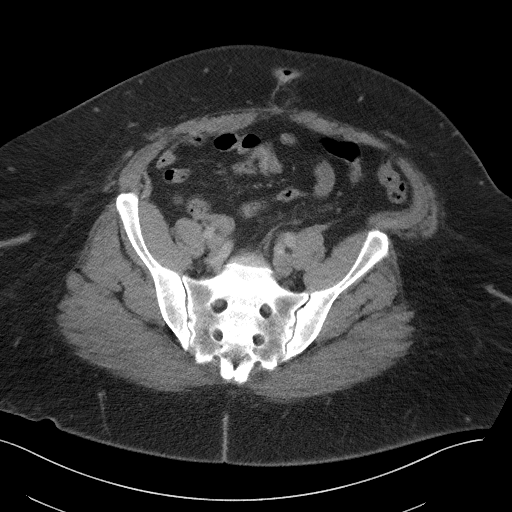
[im 39/92  soft-tissue]
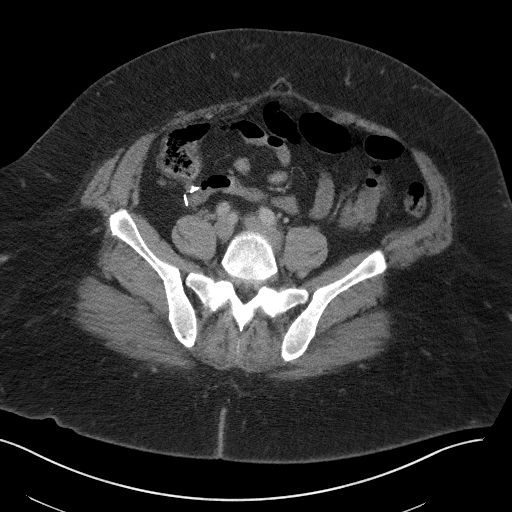
[im 48/92  soft-tissue]
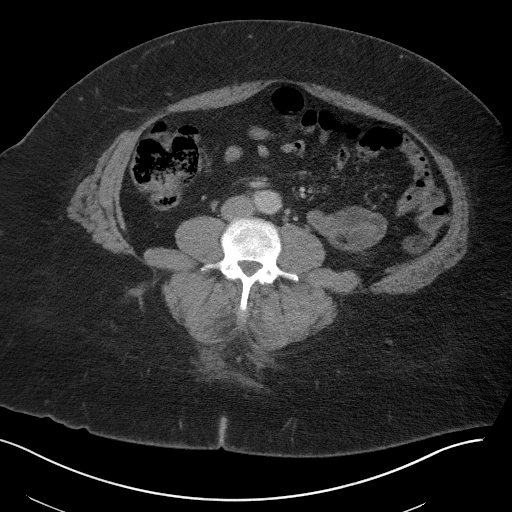
[im 53/92  soft-tissue]
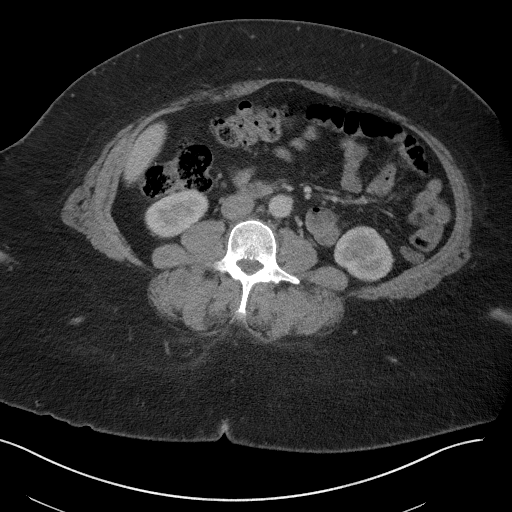
[im 58/92  soft-tissue]
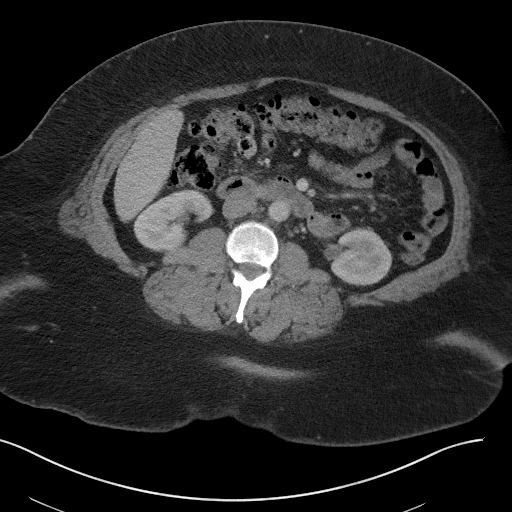
[im 58/92  bone]
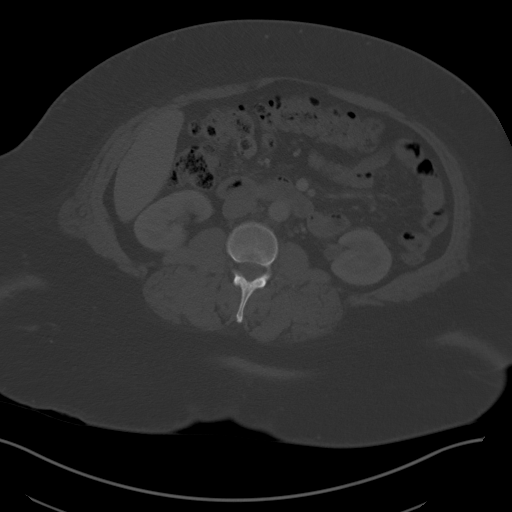
[im 68/92  soft-tissue]
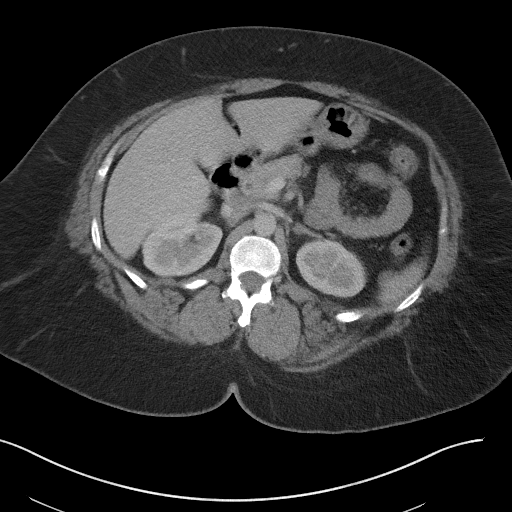
[im 72/92  soft-tissue]
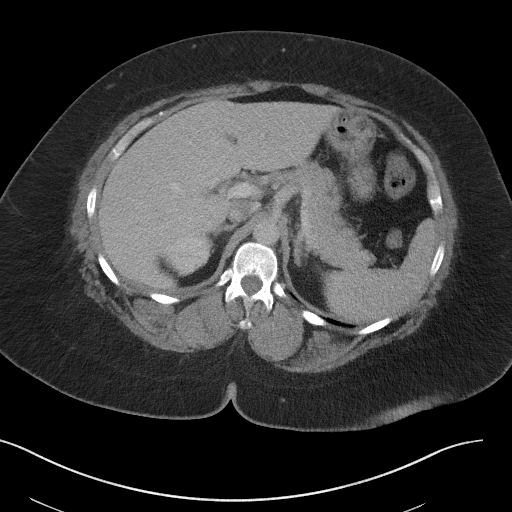
[im 77/92  soft-tissue]
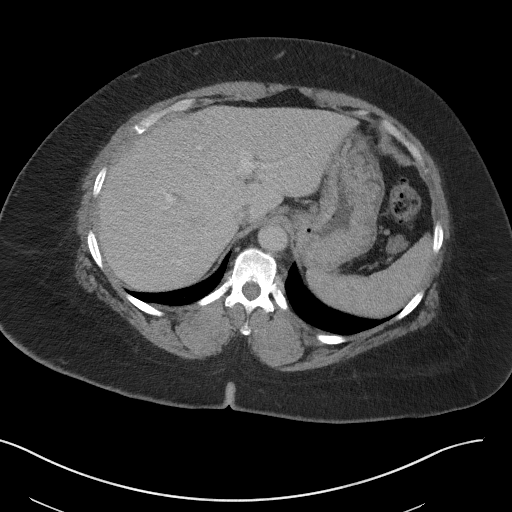
[im 87/92  soft-tissue]
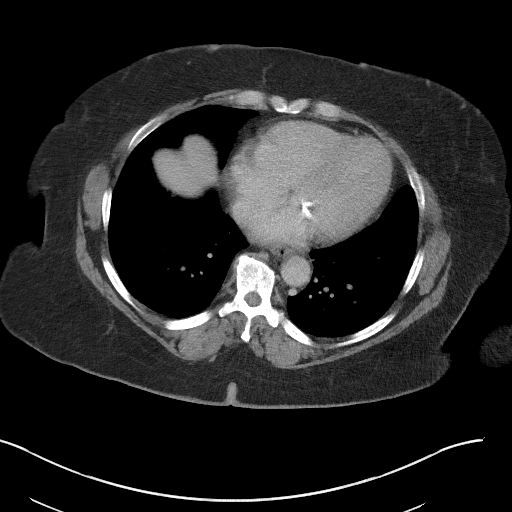

[Series 5: coronal st · coronal · 0.89mm/px · 3 of 129 slices shown]
[im 43/129  soft-tissue]
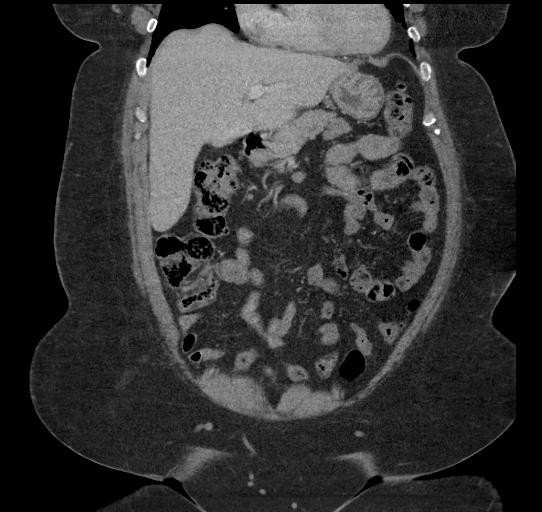
[im 57/129  soft-tissue]
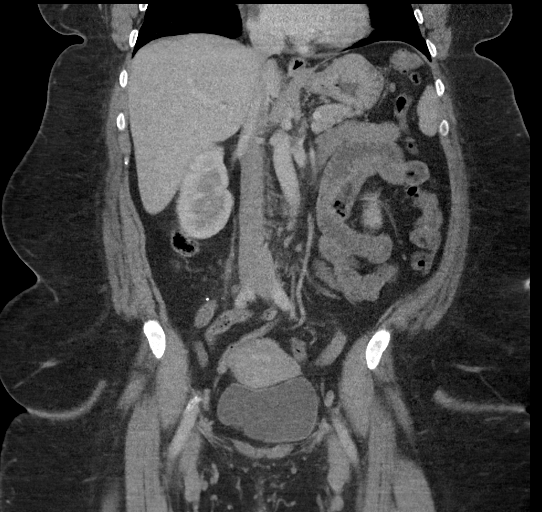
[im 72/129  soft-tissue]
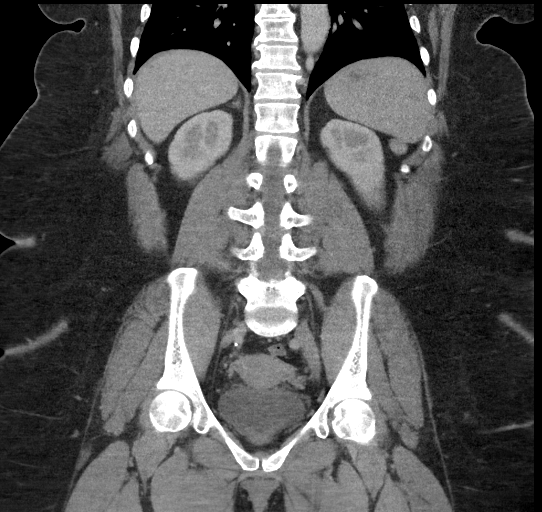

[16 of 46 positions shown; findings below may reference images not displayed]

RADIATION DOSE REDUCTION: This exam was performed according to the
departmental dose-optimization program which includes automated
exposure control, adjustment of the mA and/or kV according to
patient size and/or use of iterative reconstruction technique.

CONTRAST:  100mL OMNIPAQUE IOHEXOL 300 MG/ML  SOLN
FINDINGS: Lower chest: No acute abnormality.

Hepatobiliary: No focal liver abnormality is seen. Status post
cholecystectomy. No biliary dilatation.

Pancreas: Unremarkable. No pancreatic ductal dilatation or
surrounding inflammatory changes.

Spleen: Normal in size without focal abnormality.

Adrenals/Urinary Tract: There is a rounded hypodensity in the left
kidney which is too small to characterize, likely a cyst. Otherwise,
the kidneys, adrenal glands, and bladder are within normal limits.

Stomach/Bowel: Stomach is within normal limits. No evidence of bowel
wall thickening, distention, or inflammatory changes. The appendix
is likely surgically absent. There is colonic diverticulosis without
evidence for acute diverticulitis.

Vascular/Lymphatic: No significant vascular findings are present. No
enlarged abdominal or pelvic lymph nodes.

Reproductive: Lobulated uterine contour likely related to fibroid
change, similar to the prior study. The ovaries are unremarkable.

Other: No abdominal wall hernia or abnormality. There is a small fat
containing umbilical hernia. There is also small fat containing
midline infraumbilical ventral hernia. There is no ascites or free
air.

Musculoskeletal: No acute or significant osseous findings.
IMPRESSION: 1. No acute localizing process in the abdomen or pelvis.
2. Small fat containing ventral and umbilical hernias.
3. Lobulated uterine contour, likely related to fibroid change.

## 2021-10-30 MED ORDER — IOHEXOL 300 MG/ML  SOLN
100.0000 mL | Freq: Once | INTRAMUSCULAR | Status: AC | PRN
  Administered 2021-10-30: 100 mL via INTRAVENOUS

## 2021-11-06 ENCOUNTER — Other Ambulatory Visit: Payer: Self-pay

## 2021-11-06 ENCOUNTER — Ambulatory Visit: Payer: BC Managed Care – PPO | Admitting: Family Medicine

## 2021-11-06 ENCOUNTER — Encounter: Payer: Self-pay | Admitting: Family Medicine

## 2021-11-06 VITALS — BP 120/82 | HR 91 | Resp 16 | Ht 63.0 in | Wt 289.0 lb

## 2021-11-06 DIAGNOSIS — E559 Vitamin D deficiency, unspecified: Secondary | ICD-10-CM | POA: Diagnosis not present

## 2021-11-06 DIAGNOSIS — I10 Essential (primary) hypertension: Secondary | ICD-10-CM

## 2021-11-06 DIAGNOSIS — K649 Unspecified hemorrhoids: Secondary | ICD-10-CM

## 2021-11-06 DIAGNOSIS — E785 Hyperlipidemia, unspecified: Secondary | ICD-10-CM

## 2021-11-06 DIAGNOSIS — K5790 Diverticulosis of intestine, part unspecified, without perforation or abscess without bleeding: Secondary | ICD-10-CM

## 2021-11-06 MED ORDER — WEGOVY 0.25 MG/0.5ML ~~LOC~~ SOAJ: 0.2500 mg | SUBCUTANEOUS | 0 refills | Status: DC

## 2021-11-06 NOTE — Patient Instructions (Addendum)
F/u in 4.5 months, call if you need me sooner  You are referred to Bariatric surgery ( Nurse pls enter, I will sign)  New is once weekly medication for weight loss  Need to leave sundrop!  Increase to 5 days/ week of exercise  Fasting lipid, cmp and eGFr, TSH and vit d end Feb  Thanks for choosing [facility], we consider it a privelige to serve you.

## 2021-11-11 ENCOUNTER — Encounter: Payer: Self-pay | Admitting: Family Medicine

## 2021-11-11 DIAGNOSIS — K5790 Diverticulosis of intestine, part unspecified, without perforation or abscess without bleeding: Secondary | ICD-10-CM | POA: Insufficient documentation

## 2021-11-11 DIAGNOSIS — K649 Unspecified hemorrhoids: Secondary | ICD-10-CM | POA: Insufficient documentation

## 2021-11-11 NOTE — Progress Notes (Signed)
[name]     MRN: [id number]      DOB: Oct 17, 1970   HPI [name] is here for follow up and re-evaluation of chronic medical conditions, medication management and review of any available recent lab and radiology data.  Preventive health is updated, specifically  Cancer screening and Immunization.   Was recently evaluated in ED for rectal bleeding, due to hemorrhoids was the conclusion, she also does have diverticulosis from colonoscopy report of 09983, no recurrent bleed The PT denies any adverse reactions to current medications since the last visit.  Fell face forward on 10/30/2021 I the ED at [facility], foot got caught in the chair leg of an older lady sitting beside her.  Enies any cuts, bruises, LOC , no injuries noted Difficulty losing weight and wants referral for bariatric surgery   ROS Denies recent fever or chills. Denies sinus pressure, nasal congestion, ear pain or sore throat. Denies chest congestion, productive cough or wheezing. Denies chest pains, palpitations and leg swelling Denies abdominal pain, nausea, vomiting,diarrhea or constipation.   Denies dysuria, frequency, hesitancy or incontinence. Denies joint pain, swelling and limitation in mobility. Denies headaches, seizures, numbness, or tingling. Denies depression, anxiety or insomnia. Denies skin break down or rash.   PE  BP 120/82    Pulse 91    Resp 16    Ht 5\' 3"  (1.6 m)    Wt 289 lb (131.1 kg)    SpO2 94%    BMI 51.19 kg/m   Patient alert and oriented and in no cardiopulmonary distress.  HEENT: No facial asymmetry, EOMI,     Neck supple .  Chest: Clear to auscultation bilaterally.  CVS: S1, S2 no murmurs, no S3.Regular rate.  ABD: Soft non tender.   Ext: No edema  MS: Adequate ROM spine, shoulders, hips and knees.  Skin: Intact, no ulcerations or rash noted.  Psych: Good eye contact, normal affect. Memory intact not anxious or depressed appearing.  CNS: CN 2-12 intact, power,  normal throughout.no  focal deficits noted.   Assessment & Plan  Malignant hypertension Controlled, no change in medication DASH diet and commitment to daily physical activity for a minimum of 30 minutes discussed and encouraged, as a part of hypertension management. The importance of attaining a healthy weight is also discussed.  BP/Weight 11/06/2021 06/19/2021 06/14/2021 05/07/2021 05/06/2021 04/09/2021 3/82/5053  Systolic BP 976 734 193 790 240 973 532  Diastolic BP 82 98 82 67 85 86 84  Wt. (Lbs) 289 286 279 - 279 280 281  BMI 51.19 50.66 49.42 - 49.42 49.6 49.78       Morbid obesity  Patient re-educated about  the importance of commitment to a  minimum of 150 minutes of exercise per week as able.  The importance of healthy food choices with portion control discussed, as well as eating regularly and within a 12 hour window most days. The need to choose "clean , green" food 50 to 75% of the time is discussed, as well as to make water the primary drink and set a goal of 64 ounces water daily.    Weight /BMI 11/06/2021 06/19/2021 06/14/2021  WEIGHT 289 lb 286 lb 279 lb  HEIGHT 5\' 3"  5\' 3"  5\' 3"   BMI 51.19 kg/m2 50.66 kg/m2 49.42 kg/m2   Start wegovy, and refer to bariatric surgery  Diverticulosis BRRB on 10/30/2021, first episode  Hemorrhoids BRRB in 10/2021, ED evaluation in [place] dx cause of bleeding from the hemmorhoids  Hyperlipidemia LDL goal <  70 Hyperlipidemia:Low fat diet discussed and encouraged.   Lipid Panel  Lab Results  Component Value Date   CHOL 141 06/26/2021   HDL 58 06/26/2021   LDLCALC 68 06/26/2021   TRIG 74 06/26/2021   CHOLHDL 2.4 06/26/2021     Controlled, no change in medication Updated lab needed at/ before next visit.

## 2021-11-11 NOTE — Assessment & Plan Note (Signed)
Hyperlipidemia:Low fat diet discussed and encouraged.   Lipid Panel  Lab Results  Component Value Date   CHOL 141 06/26/2021   HDL 58 06/26/2021   LDLCALC 68 06/26/2021   TRIG 74 06/26/2021   CHOLHDL 2.4 06/26/2021     Controlled, no change in medication Updated lab needed at/ before next visit.

## 2021-11-11 NOTE — Assessment & Plan Note (Signed)
BRRB on 10/30/2021, first episode

## 2021-11-11 NOTE — Assessment & Plan Note (Signed)
BRRB in 10/2021, ED evaluation in [place] dx cause of bleeding from the hemmorhoids

## 2021-11-11 NOTE — Assessment & Plan Note (Signed)
°  Patient re-educated about  the importance of commitment to a  minimum of 150 minutes of exercise per week as able.  The importance of healthy food choices with portion control discussed, as well as eating regularly and within a 12 hour window most days. The need to choose "clean , green" food 50 to 75% of the time is discussed, as well as to make water the primary drink and set a goal of 64 ounces water daily.    Weight /BMI 11/06/2021 06/19/2021 06/14/2021  WEIGHT 289 lb 286 lb 279 lb  HEIGHT 5\' 3"  5\' 3"  5\' 3"   BMI 51.19 kg/m2 50.66 kg/m2 49.42 kg/m2   Start wegovy, and refer to bariatric surgery

## 2021-11-11 NOTE — Assessment & Plan Note (Signed)
Controlled, no change in medication DASH diet and commitment to daily physical activity for a minimum of 30 minutes discussed and encouraged, as a part of hypertension management. The importance of attaining a healthy weight is also discussed.  BP/Weight 11/06/2021 06/19/2021 06/14/2021 05/07/2021 05/06/2021 04/09/2021 5/70/1779  Systolic BP 390 300 923 300 762 263 335  Diastolic BP 82 98 82 67 85 86 84  Wt. (Lbs) 289 286 279 - 279 280 281  BMI 51.19 50.66 49.42 - 49.42 49.6 49.78

## 2021-12-17 ENCOUNTER — Telehealth: Payer: Self-pay

## 2021-12-17 NOTE — Telephone Encounter (Signed)
Denied wegovy due to not being enrolled in a weight management program with proof of enrollment  ?

## 2022-01-22 ENCOUNTER — Encounter: Payer: Self-pay | Admitting: Family Medicine

## 2022-01-27 ENCOUNTER — Other Ambulatory Visit: Payer: Self-pay | Admitting: Family Medicine

## 2022-01-27 MED ORDER — OZEMPIC (0.25 OR 0.5 MG/DOSE) 2 MG/1.5ML ~~LOC~~ SOPN: 0.2500 mg | PEN_INJECTOR | SUBCUTANEOUS | 0 refills | Status: AC

## 2022-01-27 MED ORDER — OZEMPIC (0.25 OR 0.5 MG/DOSE) 2 MG/1.5ML ~~LOC~~ SOPN: 0.5000 mg | PEN_INJECTOR | SUBCUTANEOUS | 1 refills | Status: DC

## 2022-02-07 ENCOUNTER — Other Ambulatory Visit: Payer: Self-pay | Admitting: Family Medicine

## 2022-02-07 DIAGNOSIS — I10 Essential (primary) hypertension: Secondary | ICD-10-CM

## 2022-02-27 DIAGNOSIS — H35033 Hypertensive retinopathy, bilateral: Secondary | ICD-10-CM | POA: Diagnosis not present

## 2022-03-26 ENCOUNTER — Ambulatory Visit: Payer: BC Managed Care – PPO | Admitting: Family Medicine

## 2022-03-26 ENCOUNTER — Encounter: Payer: Self-pay | Admitting: Family Medicine

## 2022-03-26 VITALS — BP 150/100 | HR 92 | Resp 16 | Ht 63.0 in | Wt 296.0 lb

## 2022-03-26 DIAGNOSIS — I10 Essential (primary) hypertension: Secondary | ICD-10-CM

## 2022-03-26 DIAGNOSIS — E785 Hyperlipidemia, unspecified: Secondary | ICD-10-CM

## 2022-03-26 DIAGNOSIS — Z1231 Encounter for screening mammogram for malignant neoplasm of breast: Secondary | ICD-10-CM

## 2022-03-26 MED ORDER — LABETALOL HCL 200 MG PO TABS: 200.0000 mg | ORAL_TABLET | Freq: Two times a day (BID) | ORAL | 1 refills | Status: DC

## 2022-03-26 NOTE — Progress Notes (Signed)
[name]     MRN: [id number]      DOB: [birth year]   HPI [name] is here for follow up and re-evaluation of chronic medical conditions, medication management and review of any available recent lab and radiology data.  Preventive health is updated, specifically  Cancer screening and Immunization.   Questions or concerns regarding consultations or procedures which the PT has had in the interim are  addressed. The PT denies any adverse reactions to current medications since the last visit.  Walking 35 000 steps / day x 1 mionth  Stopped sodas x 2 days and commited to 64 oz water daily  \Cut out whitwes and sugar, only sugar is in fruit Write food diary Trying to qualify for weight management  help  ROS Denies recent fever or chills. Denies sinus pressure, nasal congestion, ear pain or sore throat. Denies chest congestion, productive cough or wheezing. Denies chest pains, palpitations and leg swelling Denies abdominal pain, nausea, vomiting,diarrhea or constipation.   Denies dysuria, frequency, hesitancy or incontinence. Denies joint pain, swelling and limitation in mobility. Denies headaches, seizures, numbness, or tingling. Denies depression, anxiety or insomnia. Denies skin break down or rash.   PE  BP (!) 150/100   Pulse 92   Resp 16   Ht '5\' 3"'$  (1.6 m)   Wt 296 lb (134.3 kg)   SpO2 94%   BMI 52.43 kg/m   Patient alert and oriented and in no cardiopulmonary distress.  HEENT: No facial asymmetry, EOMI,     Neck supple .  Chest: Clear to auscultation bilaterally.  CVS: S1, S2 no murmurs, no S3.Regular rate.  ABD: Soft non tender.   Ext: No edema  MS: Adequate ROM spine, shoulders, hips and knees.  Skin: Intact, no ulcerations or rash noted.  Psych: Good eye contact, normal affect. Memory intact not anxious or depressed appearing.  CNS: CN 2-12 intact, power,  normal throughout.no focal deficits noted.   Assessment & Plan  Malignant  hypertension Uncontrolled , increase med dose and f/u in 7 weeks DASH diet and commitment to daily physical activity for a minimum of 30 minutes discussed and encouraged, as a part of hypertension management. The importance of attaining a healthy weight is also discussed.     03/26/2022    3:10 PM 11/06/2021    4:15 PM 11/06/2021    3:28 PM 06/19/2021   12:53 PM 06/14/2021    2:08 PM 05/07/2021    8:02 AM 05/07/2021    7:16 AM  BP/Weight  Systolic BP 786 767 209 470 962 836 629  Diastolic BP 476 82 84 98 82 67 91  Wt. (Lbs) 296  289 286 279    BMI 52.43 kg/m2  51.19 kg/m2 50.66 kg/m2 49.42 kg/m2         Morbid obesity Deteriorated   Patient re-educated about  the importance of commitment to a  minimum of 150 minutes of exercise per week as able.  The importance of healthy food choices with portion control discussed, as well as eating regularly and within a 12 hour window most days. The need to choose "clean , green" food 50 to 75% of the time is discussed, as well as to make water the primary drink and set a goal of 64 ounces water daily.       03/26/2022    3:10 PM 11/06/2021    3:28 PM 06/19/2021   12:53 PM  Weight /BMI  Weight 296 lb 289 lb 286  lb  Height '5\' 3"'$  (1.6 m) '5\' 3"'$  (1.6 m) '5\' 3"'$  (1.6 m)  BMI 52.43 kg/m2 51.19 kg/m2 50.66 kg/m2    semaglutide prescrinbed  Hyperlipidemia LDL goal <70 Hyperlipidemia:Low fat diet discussed and encouraged.   Lipid Panel  Lab Results  Component Value Date   CHOL 141 06/26/2021   HDL 58 06/26/2021   LDLCALC 68 06/26/2021   TRIG 74 06/26/2021   CHOLHDL 2.4 06/26/2021     Updated lab needed at/ before next visit.

## 2022-03-26 NOTE — Patient Instructions (Signed)
F/UI in 7 weeks, call if you need me sooner'  Increase labetalol 200 mg  to one twice daily 12 hours apart, 8 am and 8 pm, blood pressure is high  Start food diary, bring it back  Water only, aim for 64 oz  STOP eating by 7 pm  Trade while food for fresh or frozen colored foods   Sweet only from fruit   I will re send ozempic   Thanks for choosing [facility], we consider it a privelige to serve you.

## 2022-04-02 ENCOUNTER — Encounter: Payer: Self-pay | Admitting: Family Medicine

## 2022-04-02 MED ORDER — SEMAGLUTIDE (1 MG/DOSE) 4 MG/3ML ~~LOC~~ SOPN: 1.0000 mg | PEN_INJECTOR | SUBCUTANEOUS | 1 refills | Status: DC

## 2022-04-02 NOTE — Assessment & Plan Note (Signed)
Hyperlipidemia:Low fat diet discussed and encouraged.   Lipid Panel  Lab Results  Component Value Date   CHOL 141 06/26/2021   HDL 58 06/26/2021   LDLCALC 68 06/26/2021   TRIG 74 06/26/2021   CHOLHDL 2.4 06/26/2021     Updated lab needed at/ before next visit.

## 2022-04-02 NOTE — Assessment & Plan Note (Signed)
Deteriorated   Patient re-educated about  the importance of commitment to a  minimum of 150 minutes of exercise per week as able.  The importance of healthy food choices with portion control discussed, as well as eating regularly and within a 12 hour window most days. The need to choose "clean , green" food 50 to 75% of the time is discussed, as well as to make water the primary drink and set a goal of 64 ounces water daily.       03/26/2022    3:10 PM 11/06/2021    3:28 PM 06/19/2021   12:53 PM  Weight /BMI  Weight 296 lb 289 lb 286 lb  Height '5\' 3"'$  (1.6 m) '5\' 3"'$  (1.6 m) '5\' 3"'$  (1.6 m)  BMI 52.43 kg/m2 51.19 kg/m2 50.66 kg/m2    semaglutide prescrinbed

## 2022-04-02 NOTE — Assessment & Plan Note (Signed)
Uncontrolled , increase med dose and f/u in 7 weeks DASH diet and commitment to daily physical activity for a minimum of 30 minutes discussed and encouraged, as a part of hypertension management. The importance of attaining a healthy weight is also discussed.     03/26/2022    3:10 PM 11/06/2021    4:15 PM 11/06/2021    3:28 PM 06/19/2021   12:53 PM 06/14/2021    2:08 PM 05/07/2021    8:02 AM 05/07/2021    7:16 AM  BP/Weight  Systolic BP 782 423 536 144 315 400 867  Diastolic BP 619 82 84 98 82 67 91  Wt. (Lbs) 296  289 286 279    BMI 52.43 kg/m2  51.19 kg/m2 50.66 kg/m2 49.42 kg/m2

## 2022-04-07 DIAGNOSIS — E559 Vitamin D deficiency, unspecified: Secondary | ICD-10-CM | POA: Diagnosis not present

## 2022-04-07 DIAGNOSIS — E785 Hyperlipidemia, unspecified: Secondary | ICD-10-CM | POA: Diagnosis not present

## 2022-04-07 DIAGNOSIS — I10 Essential (primary) hypertension: Secondary | ICD-10-CM | POA: Diagnosis not present

## 2022-04-08 ENCOUNTER — Other Ambulatory Visit: Payer: Self-pay | Admitting: Family Medicine

## 2022-04-08 LAB — LIPID PANEL
Chol/HDL Ratio: 3.2 ratio (ref 0.0–4.4)
Cholesterol, Total: 186 mg/dL (ref 100–199)
HDL: 59 mg/dL (ref >39)
LDL Chol Calc (NIH): 111 mg/dL — ABNORMAL HIGH (ref 0–99)
Triglycerides: 90 mg/dL (ref 0–149)
VLDL Cholesterol Cal: 16 mg/dL (ref 5–40)

## 2022-04-08 LAB — CMP14+EGFR
ALT: 23 IU/L (ref 0–32)
AST: 17 IU/L (ref 0–40)
Albumin/Globulin Ratio: 1.6 (ref 1.2–2.2)
Albumin: 4.2 g/dL (ref 3.8–4.8)
Alkaline Phosphatase: 166 IU/L — ABNORMAL HIGH (ref 44–121)
BUN/Creatinine Ratio: 24 — ABNORMAL HIGH (ref 9–23)
BUN: 17 mg/dL (ref 6–24)
Bilirubin Total: 0.8 mg/dL (ref 0.0–1.2)
CO2: 24 mmol/L (ref 20–29)
Calcium: 9.2 mg/dL (ref 8.7–10.2)
Chloride: 100 mmol/L (ref 96–106)
Creatinine, Ser: 0.71 mg/dL (ref 0.57–1.00)
Globulin, Total: 2.7 g/dL (ref 1.5–4.5)
Glucose: 89 mg/dL (ref 70–99)
Potassium: 3.7 mmol/L (ref 3.5–5.2)
Sodium: 139 mmol/L (ref 134–144)
Total Protein: 6.9 g/dL (ref 6.0–8.5)
eGFR: 104 mL/min/{1.73_m2} (ref >59)

## 2022-04-08 LAB — HEMOGLOBIN A1C
Est. average glucose Bld gHb Est-mCnc: 105 mg/dL
Hgb A1c MFr Bld: 5.3 % (ref 4.8–5.6)

## 2022-04-08 LAB — VITAMIN D 25 HYDROXY (VIT D DEFICIENCY, FRACTURES): Vit D, 25-Hydroxy: 10.7 ng/mL — ABNORMAL LOW (ref 30.0–100.0)

## 2022-04-08 LAB — TSH: TSH: 1.64 u[IU]/mL (ref 0.450–4.500)

## 2022-04-08 MED ORDER — ERGOCALCIFEROL 1.25 MG (50000 UT) PO CAPS: 50000.0000 [IU] | ORAL_CAPSULE | ORAL | 2 refills | Status: DC

## 2022-04-08 MED ORDER — FENOFIBRATE 48 MG PO TABS: 48.0000 mg | ORAL_TABLET | Freq: Every day | ORAL | 2 refills | Status: DC

## 2022-04-09 ENCOUNTER — Telehealth: Payer: Self-pay | Admitting: Family Medicine

## 2022-04-09 NOTE — Telephone Encounter (Signed)
Denied by insurance.

## 2022-05-21 ENCOUNTER — Encounter: Payer: Self-pay | Admitting: Family Medicine

## 2022-05-21 ENCOUNTER — Ambulatory Visit: Payer: BC Managed Care – PPO | Admitting: Family Medicine

## 2022-05-21 VITALS — BP 124/82 | HR 95 | Resp 16 | Ht 63.0 in | Wt 294.0 lb

## 2022-05-21 DIAGNOSIS — E559 Vitamin D deficiency, unspecified: Secondary | ICD-10-CM

## 2022-05-21 DIAGNOSIS — E785 Hyperlipidemia, unspecified: Secondary | ICD-10-CM | POA: Diagnosis not present

## 2022-05-21 DIAGNOSIS — I10 Essential (primary) hypertension: Secondary | ICD-10-CM | POA: Diagnosis not present

## 2022-05-21 MED ORDER — ATORVASTATIN CALCIUM 80 MG PO TABS: 80.0000 mg | ORAL_TABLET | Freq: Every day | ORAL | 3 refills | Status: DC

## 2022-05-21 MED ORDER — LABETALOL HCL 200 MG PO TABS: 200.0000 mg | ORAL_TABLET | Freq: Two times a day (BID) | ORAL | 3 refills | Status: DC

## 2022-05-21 MED ORDER — AMLODIPINE BESYLATE 10 MG PO TABS: 10.0000 mg | ORAL_TABLET | Freq: Every day | ORAL | 3 refills | Status: DC

## 2022-05-21 MED ORDER — TRIAMTERENE-HCTZ 75-50 MG PO TABS: 1.0000 | ORAL_TABLET | Freq: Every day | ORAL | 3 refills | Status: DC

## 2022-05-21 MED ORDER — CLONIDINE HCL 0.3 MG PO TABS: 0.3000 mg | ORAL_TABLET | Freq: Every day | ORAL | 3 refills | Status: DC

## 2022-05-21 NOTE — Patient Instructions (Addendum)
F/U in November, call if you need me sooner  HAPPY BIRTHDAY in 2 days!!!  Fasting lipid, cmp and EGFR and Vit d 5 days before visit  Need to take BOTH atorvastatin and fenofibrate for cholesterol  Nurse pls refill  90 day supply with 3 refills on her clonoidine, labetalol, amlodipine and maxzide  Please follow up on bariatric surgery  Good that you are exercising regularly, keep it up  Thanks for choosing [facility], we consider it a privelige to serve you.

## 2022-05-25 ENCOUNTER — Encounter: Payer: Self-pay | Admitting: Family Medicine

## 2022-05-25 NOTE — Progress Notes (Signed)
[name]     MRN: [id number]      DOB: 11-Jan-1971   HPI [name] is here for follow up and re-evaluation of chronic medical conditions, medication management and review of any available recent lab and radiology data.  Preventive health is updated, specifically  Cancer screening and Immunization.   Questions or concerns regarding consultations or procedures which the PT has had in the interim are  addressed. The PT denies any adverse reactions to current medications since the last visit.  There are no new concerns.  There are no specific complaints   ROS Denies recent fever or chills. Denies sinus pressure, nasal congestion, ear pain or sore throat. Denies chest congestion, productive cough or wheezing. Denies chest pains, palpitations and leg swelling Denies abdominal pain, nausea, vomiting,diarrhea or constipation.   Denies dysuria, frequency, hesitancy or incontinence. Denies joint pain, swelling and limitation in mobility. Denies headaches, seizures, numbness, or tingling. Denies depression, anxiety or insomnia. Denies skin break down or rash.   PE  BP 124/82   Pulse 95   Resp 16   Ht '5\' 3"'$  (1.6 m)   Wt 294 lb (133.4 kg)   SpO2 95%   BMI 52.08 kg/m   Patient alert and oriented and in no cardiopulmonary distress.  HEENT: No facial asymmetry, EOMI,     Neck supple .  Chest: Clear to auscultation bilaterally.  CVS: S1, S2 no murmurs, no S3.Regular rate.  ABD: Soft non tender.   Ext: No edema  MS: Adequate ROM spine, shoulders, hips and knees.  Skin: Intact, no ulcerations or rash noted.  Psych: Good eye contact, normal affect. Memory intact not anxious or depressed appearing.  CNS: CN 2-12 intact, power,  normal throughout.no focal deficits noted.   Assessment & Plan  Malignant hypertension Controlled, no change in medication DASH diet and commitment to daily physical activity for a minimum of 30 minutes discussed and encouraged, as a part of  hypertension management. The importance of attaining a healthy weight is also discussed.     05/21/2022    3:38 PM 05/21/2022    3:35 PM 05/21/2022    3:07 PM 03/26/2022    3:10 PM 11/06/2021    4:15 PM 11/06/2021    3:28 PM 06/19/2021   12:53 PM  BP/Weight  Systolic BP 812 751 700 174 944 967 591  Diastolic BP 82 88 92 638 82 84 98  Wt. (Lbs)   294 296  289 286  BMI   52.08 kg/m2 52.43 kg/m2  51.19 kg/m2 50.66 kg/m2       Hyperlipidemia LDL goal <70 Hyperlipidemia:Low fat diet discussed and encouraged.   Lipid Panel  Lab Results  Component Value Date   CHOL 186 04/07/2022   HDL 59 04/07/2022   LDLCALC 111 (H) 04/07/2022   TRIG 90 04/07/2022   CHOLHDL 3.2 04/07/2022     Uncontrolled re educated re ned for BOTH meds and to lower fat intake  Morbid obesity  Patient re-educated about  the importance of commitment to a  minimum of 150 minutes of exercise per week as able.  The importance of healthy food choices with portion control discussed, as well as eating regularly and within a 12 hour window most days. The need to choose "clean , green" food 50 to 75% of the time is discussed, as well as to make water the primary drink and set a goal of 64 ounces water daily.       05/21/2022  3:07 PM 03/26/2022    3:10 PM 11/06/2021    3:28 PM  Weight /BMI  Weight 294 lb 296 lb 289 lb  Height '5\' 3"'$  (1.6 m) '5\' 3"'$  (1.6 m) '5\' 3"'$  (1.6 m)  BMI 52.08 kg/m2 52.43 kg/m2 51.19 kg/m2  needs to f/u with bariatric surgery

## 2022-05-25 NOTE — Assessment & Plan Note (Signed)
  Patient re-educated about  the importance of commitment to a  minimum of 150 minutes of exercise per week as able.  The importance of healthy food choices with portion control discussed, as well as eating regularly and within a 12 hour window most days. The need to choose "clean , green" food 50 to 75% of the time is discussed, as well as to make water the primary drink and set a goal of 64 ounces water daily.       05/21/2022    3:07 PM 03/26/2022    3:10 PM 11/06/2021    3:28 PM  Weight /BMI  Weight 294 lb 296 lb 289 lb  Height '5\' 3"'$  (1.6 m) '5\' 3"'$  (1.6 m) '5\' 3"'$  (1.6 m)  BMI 52.08 kg/m2 52.43 kg/m2 51.19 kg/m2  needs to f/u with bariatric surgery

## 2022-05-25 NOTE — Assessment & Plan Note (Signed)
Hyperlipidemia:Low fat diet discussed and encouraged.   Lipid Panel  Lab Results  Component Value Date   CHOL 186 04/07/2022   HDL 59 04/07/2022   LDLCALC 111 (H) 04/07/2022   TRIG 90 04/07/2022   CHOLHDL 3.2 04/07/2022     Uncontrolled re educated re ned for BOTH meds and to lower fat intake

## 2022-05-25 NOTE — Assessment & Plan Note (Signed)
Controlled, no change in medication DASH diet and commitment to daily physical activity for a minimum of 30 minutes discussed and encouraged, as a part of hypertension management. The importance of attaining a healthy weight is also discussed.     05/21/2022    3:38 PM 05/21/2022    3:35 PM 05/21/2022    3:07 PM 03/26/2022    3:10 PM 11/06/2021    4:15 PM 11/06/2021    3:28 PM 06/19/2021   12:53 PM  BP/Weight  Systolic BP 202 542 706 237 628 315 176  Diastolic BP 82 88 92 160 82 84 98  Wt. (Lbs)   294 296  289 286  BMI   52.08 kg/m2 52.43 kg/m2  51.19 kg/m2 50.66 kg/m2

## 2022-05-28 ENCOUNTER — Ambulatory Visit
Admission: EM | Admit: 2022-05-28 | Discharge: 2022-05-28 | Disposition: A | Discharge status: Home / Self Care | Payer: BC Managed Care – PPO | Attending: Family Medicine | Admitting: Family Medicine

## 2022-05-28 ENCOUNTER — Encounter: Payer: Self-pay | Admitting: Emergency Medicine

## 2022-05-28 DIAGNOSIS — T162XXA Foreign body in left ear, initial encounter: Secondary | ICD-10-CM

## 2022-05-28 NOTE — ED Triage Notes (Signed)
Piece of hearing aid is stuck in left ear.

## 2022-05-29 NOTE — ED Provider Notes (Signed)
[facility]   [id number] 05/28/22 Arrival Time: [place]:  1. Acute foreign body of left ear canal, initial encounter    Plastic piece of hearing aid easily removed with alligator forceps. No complications.   Follow-up Information     Fayrene Helper, MD.   Specialty: Family Medicine Why: As needed. Contact information: [address] [place] [address] [phone]                 Reviewed expectations re: course of current medical issues. Questions answered. Outlined signs and symptoms indicating need for more acute intervention. Understanding verbalized. After Visit Summary given.   SUBJECTIVE: History from: Patient. [name] is a 51 y.o. female. Reports: piece of hearing aid stuck in LEFT ear; today; no pain or bleeding.   OBJECTIVE:  Vitals:   05/28/22 1912  BP: (!) 148/92  Pulse: 93  Resp: 18  Temp: 98.3 F (36.8 C)  TempSrc: Oral  SpO2: 96%    General appearance: alert; no distress HENT: [place]; AT; plastic piece of hearing aid in LEFT EAC Psychological: alert and cooperative; normal mood and affect    Allergies  Allergen Reactions   Lisinopril Cough    Past Medical History:  Diagnosis Date   CAD (coronary artery disease), native coronary artery    s/p DES to LAD 02/01/20   Chest pain 01/2020   Facial numbness    Fibroid    Fx ankle    Hypertension    Myocardial infarction (HCC)    Obesity    PONV (postoperative nausea and vomiting)    Social History   Socioeconomic History   Marital status: Single    Spouse name: Not on file   Number of children: 4   Years of education: Not on file   Highest education level: Associate degree: occupational, Hotel manager, or vocational program  Occupational History   Not on file  Tobacco Use   Smoking status: Never   Smokeless tobacco: Never  Vaping Use   Vaping Use: Never used  Substance and Sexual Activity   Alcohol use: Yes    Comment: rarely    Drug use: No   Sexual activity: Yes    Birth control/protection: Surgical  Other Topics Concern   Not on file  Social History Narrative   Lives with children   Caffeine- sodas, 48 oz daily   Social Determinants of Health   Financial Resource Strain: Not on file  Food Insecurity: Not on file  Transportation Needs: Not on file  Physical Activity: Not on file  Stress: Not on file  Social Connections: Not on file  Intimate Partner Violence: Not on file   Family History  Problem Relation Age of Onset   Coronary artery disease Mother    Hypertension Mother    Coronary artery disease Father    Kidney disease Father    Hypertension Father    Multiple sclerosis Sister    Hypertension Sister    Stroke Maternal Grandmother    Heart attack Maternal Grandfather    Anesthesia problems Neg Hx    Hypotension Neg Hx    Malignant hyperthermia Neg Hx    Pseudochol deficiency Neg Hx    Past Surgical History:  Procedure Laterality Date   APPENDECTOMY     APPLICATION OF CRANIAL NAVIGATION N/A 08/16/2019   Procedure: APPLICATION OF CRANIAL NAVIGATION;  Surgeon: Judith Part, MD;  Location: [place];  Service: Neurosurgery;  Laterality: N/A;  bilateral breast reduction  2004   CHOLECYSTECTOMY     COLONOSCOPY WITH PROPOFOL N/A 05/07/2021   Procedure: COLONOSCOPY WITH PROPOFOL;  Surgeon: Harvel Quale, MD;  [facility];  Service: Gastroenterology;  Laterality: N/A;  7:30   CORONARY STENT INTERVENTION N/A 02/01/2020   Procedure: CORONARY STENT INTERVENTION;  Surgeon: Burnell Blanks, MD;  Location: [facility];  Service: Cardiovascular;  Laterality: N/A;   CRANIOTOMY Left 08/16/2019   Procedure: Left craniotomy for tumor resection;  Surgeon: Judith Part, MD;  Location: [facility];  Service: Neurosurgery;  Laterality: Left;  Left craniotomy for tumor resection   INTRAVASCULAR PRESSURE WIRE/FFR STUDY N/A 02/01/2020   Procedure: INTRAVASCULAR  PRESSURE WIRE/FFR STUDY;  Surgeon: Burnell Blanks, MD;  Location: [facility];  Service: Cardiovascular;  Laterality: N/A;   LEFT HEART CATH AND CORONARY ANGIOGRAPHY N/A 02/01/2020   Procedure: LEFT HEART CATH AND CORONARY ANGIOGRAPHY;  Surgeon: Burnell Blanks, MD;  Location: [facility];  Service: Cardiovascular;  Laterality: N/A;   POLYPECTOMY  05/07/2021   Procedure: POLYPECTOMY;  Surgeon: Montez Morita, Quillian Quince, MD;  [facility];  Service: Gastroenterology;;   REDUCTION MAMMAPLASTY Bilateral    removal of left ovarian cyst  2003   SALPINGECTOMY     TUBAL LIGATION  2011   Women's, removal of tubes 2013     Vanessa Kick, MD 05/29/22 [id number]

## 2022-08-22 ENCOUNTER — Ambulatory Visit: Payer: BC Managed Care – PPO | Admitting: Family Medicine

## 2022-08-22 ENCOUNTER — Encounter: Payer: Self-pay | Admitting: Family Medicine

## 2022-10-01 DIAGNOSIS — E785 Hyperlipidemia, unspecified: Secondary | ICD-10-CM | POA: Diagnosis not present

## 2022-10-01 DIAGNOSIS — I10 Essential (primary) hypertension: Secondary | ICD-10-CM | POA: Diagnosis not present

## 2022-10-01 DIAGNOSIS — E559 Vitamin D deficiency, unspecified: Secondary | ICD-10-CM | POA: Diagnosis not present

## 2022-10-02 ENCOUNTER — Encounter: Payer: Self-pay | Admitting: Family Medicine

## 2022-10-02 ENCOUNTER — Other Ambulatory Visit (HOSPITAL_COMMUNITY)
Admission: RE | Admit: 2022-10-02 | Discharge: 2022-10-02 | Disposition: A | Discharge status: Home / Self Care | Payer: BC Managed Care – PPO | Source: Ambulatory Visit | Attending: Family Medicine | Admitting: Family Medicine

## 2022-10-02 ENCOUNTER — Ambulatory Visit: Payer: BC Managed Care – PPO | Admitting: Family Medicine

## 2022-10-02 VITALS — BP 148/96 | HR 96 | Ht 63.0 in | Wt 293.0 lb

## 2022-10-02 DIAGNOSIS — N898 Other specified noninflammatory disorders of vagina: Secondary | ICD-10-CM | POA: Diagnosis not present

## 2022-10-02 DIAGNOSIS — I214 Non-ST elevation (NSTEMI) myocardial infarction: Secondary | ICD-10-CM

## 2022-10-02 DIAGNOSIS — I10 Essential (primary) hypertension: Secondary | ICD-10-CM

## 2022-10-02 DIAGNOSIS — E785 Hyperlipidemia, unspecified: Secondary | ICD-10-CM

## 2022-10-02 LAB — CMP14+EGFR
ALT: 28 IU/L (ref 0–32)
AST: 27 IU/L (ref 0–40)
Albumin/Globulin Ratio: 1.8 (ref 1.2–2.2)
Albumin: 4.2 g/dL (ref 3.9–4.9)
Alkaline Phosphatase: 184 IU/L — ABNORMAL HIGH (ref 44–121)
BUN/Creatinine Ratio: 16 (ref 9–23)
BUN: 12 mg/dL (ref 6–24)
Bilirubin Total: 0.9 mg/dL (ref 0.0–1.2)
CO2: 25 mmol/L (ref 20–29)
Calcium: 9.3 mg/dL (ref 8.7–10.2)
Chloride: 106 mmol/L (ref 96–106)
Creatinine, Ser: 0.74 mg/dL (ref 0.57–1.00)
Globulin, Total: 2.4 g/dL (ref 1.5–4.5)
Glucose: 101 mg/dL — ABNORMAL HIGH (ref 70–99)
Potassium: 3.8 mmol/L (ref 3.5–5.2)
Sodium: 144 mmol/L (ref 134–144)
Total Protein: 6.6 g/dL (ref 6.0–8.5)
eGFR: 99 mL/min/{1.73_m2} (ref >59)

## 2022-10-02 LAB — VITAMIN D 25 HYDROXY (VIT D DEFICIENCY, FRACTURES): Vit D, 25-Hydroxy: 24.7 ng/mL — ABNORMAL LOW (ref 30.0–100.0)

## 2022-10-02 LAB — LIPID PANEL
Chol/HDL Ratio: 2.4 ratio (ref 0.0–4.4)
Cholesterol, Total: 123 mg/dL (ref 100–199)
HDL: 52 mg/dL (ref >39)
LDL Chol Calc (NIH): 58 mg/dL (ref 0–99)
Triglycerides: 62 mg/dL (ref 0–149)
VLDL Cholesterol Cal: 13 mg/dL (ref 5–40)

## 2022-10-02 MED ORDER — CARVEDILOL 3.125 MG PO TABS: 3.1250 mg | ORAL_TABLET | Freq: Two times a day (BID) | ORAL | 1 refills | Status: DC

## 2022-10-02 MED ORDER — AZITHROMYCIN 250 MG PO TABS: ORAL_TABLET | ORAL | 0 refills | Status: AC

## 2022-10-02 MED ORDER — ZEPBOUND 2.5 MG/0.5ML ~~LOC~~ SOAJ: 2.5000 mg | SUBCUTANEOUS | 2 refills | Status: DC

## 2022-10-02 NOTE — Progress Notes (Signed)
[name]     MRN: [id number]      DOB: 1971/02/27   HPI [name] is here for follow up and re-evaluation of chronic medical conditions, medication management and review of any available recent lab and radiology data.  Preventive health is updated, specifically  Cancer screening and Immunization.   Needs to establish with Cardiology locally. The PT denies any adverse reactions to current medications since the last visit.  Yellow malodorous d/c  x 1 week no sexual activity for over 6 months No cycle x 12 months [place] fl[place]hess will deal with this naturally  ROS Denies recent fever or chills. Denies sinus pressure, nasal congestion, ear pain or sore throat. Denies chest congestion, productive cough or wheezing. Denies chest pains, palpitations and leg swelling Denies abdominal pain, nausea, vomiting,diarrhea or constipation.   Denies dysuria, frequency, hesitancy or incontinence. Denies joint pain, swelling and limitation in mobility. Denies headaches, seizures, numbness, or tingling. Denies depression, anxiety or insomnia. Denies skin break down or rash.   PE  BP (!) 148/96   Pulse 96   Ht '5\' 3"'$  (1.6 m)   Wt 293 lb (132.9 kg)   SpO2 91%   BMI 51.90 kg/m  Patient alert and oriented and in no cardiopulmonary distress.  HEENT: No facial asymmetry, EOMI,     Neck supple .  Chest: Clear to auscultation bilaterally.  CVS: S1, S2 no murmurs, no S3.Regular rate.  ABD: Soft non tender.  Pelvic: yellow vaginal d/c , no cervical motion or adnexal tenderness Ext: No edema  MS: Adequate ROM spine, shoulders, hips and knees.  Skin: Intact, no ulcerations or rash noted.  Psych: Good eye contact, normal affect. Memory intact not anxious or depressed appearing.  CNS: CN 2-12 intact, power,  normal throughout.no focal deficits noted.   Assessment & Plan  Malignant hypertension Uncontrolled on  polypharmacy, refer [facility] DASH diet and commitment to daily physical activity for a  minimum of 30 minutes discussed and encouraged, as a part of hypertension management. The importance of attaining a healthy weight is also discussed.     10/02/2022    2:35 PM 10/02/2022    2:32 PM 10/02/2022    1:52 PM 10/02/2022    1:50 PM 05/28/2022    7:12 PM 05/21/2022    3:38 PM 05/21/2022    3:35 PM  BP/Weight  Systolic BP 379 024 097 353 299 242 683  Diastolic BP 96 92 86 97 92 82 88  Wt. (Lbs)    293     BMI    51.9 kg/m2          Non-ST elevation (NSTEMI) myocardial infarction [facility]) Needs to establish with local Cardiologist, currently asymptomatic  Morbid obesity  Patient re-educated about  the importance of commitment to a  minimum of 150 minutes of exercise per week as able.  The importance of healthy food choices with portion control discussed, as well as eating regularly and within a 12 hour window most days. The need to choose "clean , green" food 50 to 75% of the time is discussed, as well as to make water the primary drink and set a goal of 64 ounces water daily.       10/02/2022    1:50 PM 05/21/2022    3:07 PM 03/26/2022    3:10 PM  Weight /BMI  Weight 293 lb 294 lb 296 lb  Height '5\' 3"'$  (1.6 m) '5\' 3"'$  (1.6 m) '5\' 3"'$  (1.6 m)  BMI  51.9 kg/m2 52.08 kg/m2 52.43 kg/m2      Vaginal discharge  1 Week history, specimen sent for STD testing  Hyperlipidemia LDL goal <70 Hyperlipidemia:Low fat diet discussed and encouraged.   Lipid Panel  Lab Results  Component Value Date   CHOL 123 10/01/2022   HDL 52 10/01/2022   LDLCALC 58 10/01/2022   TRIG 62 10/01/2022   CHOLHDL 2.4 10/01/2022     Controlled, no change in medication

## 2022-10-02 NOTE — Patient Instructions (Addendum)
F/U in 5 weeks, call if you need me sooner  New medication for blood pressure , carvedilol one twice daily, continue all current medications, bp is still high, you are also eferred to [facility].  Labs are good  Specimens are sent for culture , you will be contacted with results  New medication once weekly zepbound is prescribed for weight management , please let me know if ins will not cover  It is important that you exercise regularly at least 30 minutes 5 times a week. If you develop chest pain, have severe difficulty breathing, or feel very tired, stop exercising immediately and seek medical attention  Think about what you will eat, plan ahead. Choose " clean, green, fresh or frozen" over canned, processed or packaged foods which are more sugary, salty and fatty. 70 to 75% of food eaten should be vegetables and fruit. Three meals at set times with snacks allowed between meals, but they must be fruit or vegetables. Aim to eat over a 12 hour period , example 7 am to 7 pm, and STOP after  your last meal of the day. Drink water,generally about 64 ounces per day, no other drink is as healthy. Fruit juice is best enjoyed in a healthy way, by EATING the fruit. Thanks for choosing [facility], we consider it a privelige to serve you.

## 2022-10-03 ENCOUNTER — Encounter: Payer: Self-pay | Admitting: Family Medicine

## 2022-10-03 DIAGNOSIS — N898 Other specified noninflammatory disorders of vagina: Secondary | ICD-10-CM | POA: Insufficient documentation

## 2022-10-03 NOTE — Assessment & Plan Note (Signed)
1 Week history, specimen sent for STD testing

## 2022-10-03 NOTE — Assessment & Plan Note (Signed)
Hyperlipidemia:Low fat diet discussed and encouraged.   Lipid Panel  Lab Results  Component Value Date   CHOL 123 10/01/2022   HDL 52 10/01/2022   LDLCALC 58 10/01/2022   TRIG 62 10/01/2022   CHOLHDL 2.4 10/01/2022     Controlled, no change in medication

## 2022-10-03 NOTE — Assessment & Plan Note (Signed)
  Patient re-educated about  the importance of commitment to a  minimum of 150 minutes of exercise per week as able.  The importance of healthy food choices with portion control discussed, as well as eating regularly and within a 12 hour window most days. The need to choose "clean , green" food 50 to 75% of the time is discussed, as well as to make water the primary drink and set a goal of 64 ounces water daily.       10/02/2022    1:50 PM 05/21/2022    3:07 PM 03/26/2022    3:10 PM  Weight /BMI  Weight 293 lb 294 lb 296 lb  Height '5\' 3"'$  (1.6 m) '5\' 3"'$  (1.6 m) '5\' 3"'$  (1.6 m)  BMI 51.9 kg/m2 52.08 kg/m2 52.43 kg/m2

## 2022-10-03 NOTE — Assessment & Plan Note (Signed)
Uncontrolled on  polypharmacy, refer [facility] DASH diet and commitment to daily physical activity for a minimum of 30 minutes discussed and encouraged, as a part of hypertension management. The importance of attaining a healthy weight is also discussed.     10/02/2022    2:35 PM 10/02/2022    2:32 PM 10/02/2022    1:52 PM 10/02/2022    1:50 PM 05/28/2022    7:12 PM 05/21/2022    3:38 PM 05/21/2022    3:35 PM  BP/Weight  Systolic BP 383 818 403 754 360 677 034  Diastolic BP 96 92 86 97 92 82 88  Wt. (Lbs)    293     BMI    51.9 kg/m2

## 2022-10-03 NOTE — Assessment & Plan Note (Signed)
Needs to establish with local Cardiologist, currently asymptomatic

## 2022-10-07 LAB — CERVICOVAGINAL ANCILLARY ONLY
Bacterial Vaginitis (gardnerella): POSITIVE — AB
Candida Glabrata: NEGATIVE
Candida Vaginitis: NEGATIVE
Chlamydia: NEGATIVE
Comment: NEGATIVE
Comment: NEGATIVE
Comment: NEGATIVE
Comment: NEGATIVE
Comment: NEGATIVE
Comment: NORMAL
Neisseria Gonorrhea: NEGATIVE
Trichomonas: POSITIVE — AB

## 2022-10-08 MED ORDER — METRONIDAZOLE 500 MG PO TABS: 500.0000 mg | ORAL_TABLET | Freq: Two times a day (BID) | ORAL | 0 refills | Status: DC

## 2022-10-08 NOTE — Progress Notes (Unsigned)
Cardiology Office Note:    Date:  10/09/2022   ID:  [name], DOB 1971/02/19, MRN [id number]  PCP:  Fayrene Helper, MD   [facility] Providers Cardiologist:  [name], MD     Referring MD: Fayrene Helper, MD   Chief Complaint: annual follow-up CAD  History of Present Illness:    [name] is a very pleasant 51 y.o. female with a hx of CAD s/p anterior STEMI 01/2020 treated with DES to LAD, HTN, and obesity.  She presented to [facility] April 2021 with anterior STEMI.  DES was placed in LAD.Marland Kitchen TTE 02/01/2020 with normal LVEF 55 to 60%, moderate LVH, G1 DD, normal RV, no significant valvular dysfunction.   Last cardiology clinic visit was 06/19/2021 with [name], PA at which time [name] was stopped and she was advised to continue ASA 81 mg daily.  She was advised to return in 1 year for follow-up and wished to be seen in our [place] office.  Today, she is here alone for follow-up. Reports she is feeling well. Has no specific cardiac concerns. She denies chest pain, shortness of breath, lower extremity edema, fatigue, palpitations, melena, hematuria, hemoptysis, diaphoresis, weakness, presyncope, syncope, orthopnea, and PND. Had one episode of blood in stool last year and had normal colonoscopy. Works out at least 2 days per week at [employer] on treadmill, no symptoms with exercise. Feels that her hands and feet stay swollen, limits sodium intake. Admits that she needs to reduce intake of Sun Drop soda. Frequently skips meals and drinks soda instead of eating.    Past Medical History:  Diagnosis Date   CAD (coronary artery disease), native coronary artery    s/p DES to LAD 02/01/20   Chest pain 01/2020   Facial numbness    Fibroid    Fx ankle    Hypertension    Myocardial infarction ([place])    Obesity    PONV (postoperative nausea and vomiting)     Past Surgical History:  Procedure Laterality Date   APPENDECTOMY     APPLICATION  OF CRANIAL NAVIGATION N/A 08/16/2019   Procedure: APPLICATION OF CRANIAL NAVIGATION;  Surgeon: Judith Part, MD;  Location: [place];  Service: Neurosurgery;  Laterality: N/A;   bilateral breast reduction  2004   CHOLECYSTECTOMY     COLONOSCOPY WITH PROPOFOL N/A 05/07/2021   Procedure: COLONOSCOPY WITH PROPOFOL;  Surgeon: Harvel Quale, MD;  [facility];  Service: Gastroenterology;  Laterality: N/A;  7:30   CORONARY STENT INTERVENTION N/A 02/01/2020   Procedure: CORONARY STENT INTERVENTION;  Surgeon: Burnell Blanks, MD;  Location: [place] [facility];  Service: Cardiovascular;  Laterality: N/A;   CRANIOTOMY Left 08/16/2019   Procedure: Left craniotomy for tumor resection;  Surgeon: Judith Part, MD;  Location: [place];  Service: Neurosurgery;  Laterality: Left;  Left craniotomy for tumor resection   INTRAVASCULAR PRESSURE WIRE/FFR STUDY N/A 02/01/2020   Procedure: INTRAVASCULAR PRESSURE WIRE/FFR STUDY;  Surgeon: Burnell Blanks, MD;  Location: [place] [facility];  Service: Cardiovascular;  Laterality: N/A;   LEFT HEART CATH AND CORONARY ANGIOGRAPHY N/A 02/01/2020   Procedure: LEFT HEART CATH AND CORONARY ANGIOGRAPHY;  Surgeon: Burnell Blanks, MD;  Location: [place] [facility];  Service: Cardiovascular;  Laterality: N/A;   POLYPECTOMY  05/07/2021   Procedure: POLYPECTOMY;  Surgeon: Harvel Quale, MD;  [facility];  Service: Gastroenterology;;   REDUCTION MAMMAPLASTY Bilateral  removal of left ovarian cyst  2003   SALPINGECTOMY     TUBAL LIGATION  2011   Women's, removal of tubes 2013    Current Medications: Current Meds  Medication Sig   acetaminophen (TYLENOL) 325 MG tablet Take 325 mg by mouth every 6 (six) hours as needed.   amLODipine (NORVASC) 10 MG tablet Take 1 tablet (10 mg total) by mouth daily.   aspirin 81 MG chewable tablet Chew 1 tablet (81 mg total) by mouth daily.   atorvastatin (LIPITOR) 80  MG tablet Take 1 tablet (80 mg total) by mouth daily.   carvedilol (COREG) 3.125 MG tablet Take 1 tablet (3.125 mg total) by mouth 2 (two) times daily with a meal.   cloNIDine (CATAPRES) 0.3 MG tablet Take 1 tablet (0.3 mg total) by mouth at bedtime.   ergocalciferol (VITAMIN D2) 1.25 MG (50000 UT) capsule Take 1 capsule (50,000 Units total) by mouth once a week. One capsule once weekly   fenofibrate (TRICOR) 48 MG tablet Take 1 tablet (48 mg total) by mouth daily.   ibuprofen (ADVIL) 600 MG tablet Take 1 tablet (600 mg total) by mouth every 6 (six) hours as needed.   labetalol (NORMODYNE) 200 MG tablet Take 1 tablet (200 mg total) by mouth 2 (two) times daily.   metroNIDAZOLE (FLAGYL) 500 MG tablet Take 1 tablet (500 mg total) by mouth 2 (two) times daily.   triamterene-hydrochlorothiazide (MAXZIDE) 75-50 MG tablet Take 1 tablet by mouth daily.   ZEPBOUND 2.5 MG/0.5ML Pen Inject 2.5 mg into the skin once a week.     Allergies:   Lisinopril   Social History   Socioeconomic History   Marital status: Single    Spouse name: Not on file   Number of children: 4   Years of education: Not on file   Highest education level: Associate degree: occupational, Hotel manager, or vocational program  Occupational History   Not on file  Tobacco Use   Smoking status: Never   Smokeless tobacco: Never  Vaping Use   Vaping Use: Never used  Substance and Sexual Activity   Alcohol use: Yes    Comment: rarely   Drug use: No   Sexual activity: Yes    Birth control/protection: Surgical  Other Topics Concern   Not on file  Social History Narrative   Lives with children   Caffeine- sodas, 48 oz daily   Social Determinants of Health   Financial Resource Strain: Not on file  Food Insecurity: Not on file  Transportation Needs: Not on file  Physical Activity: Not on file  Stress: Not on file  Social Connections: Not on file     Family History: The patient's family history includes Coronary artery  disease in her father and mother; Heart attack in her maternal grandfather; Hypertension in her father, mother, and sister; Kidney disease in her father; Multiple sclerosis in her sister; Stroke in her maternal grandmother. There is no history of Anesthesia problems, Hypotension, Malignant hyperthermia, or Pseudochol deficiency.  ROS:   Please see the history of present illness.  All other systems reviewed and are negative.  Labs/Other Studies Reviewed:    The following studies were reviewed today:  Echo 02/01/20  1. Left ventricular ejection fraction, by estimation, is 55 to 60%. The  left ventricle has normal function. The left ventricle has no regional  wall motion abnormalities. There is moderate left ventricular hypertrophy.  Left ventricular diastolic  parameters are consistent with Grade I diastolic dysfunction (impaired  relaxation).  2. Right ventricular systolic function is normal. The right ventricular  size is normal. Tricuspid regurgitation signal is inadequate for assessing  PA pressure.   3. The mitral valve is grossly normal. No evidence of mitral valve  regurgitation. No evidence of mitral stenosis.   4. The aortic valve is tricuspid. Aortic valve regurgitation is not  visualized. No aortic stenosis is present.   5. The inferior vena cava is normal in size with greater than 50%  respiratory variability, suggesting right atrial pressure of 3 mmHg.   LHC 02/02/20 Mid LAD lesion is 80% stenosed. A drug-eluting stent was successfully placed using a STENT RESOLUTE ONYX 3.5X18. Post intervention, there is a 0% residual stenosis. The left ventricular systolic function is normal. LV end diastolic pressure is normal. The left ventricular ejection fraction is 55-65% by visual estimate. There is no mitral valve regurgitation.   1. Severe stenosis mid LAD. Flow limiting by DFR 0.86.  2. Successful PTCA/DES x 1 mid LAD 3. No obstructive disease in the RCA or Circumflex 4.  Normal LV systolic function   Recommendations: Continue DAPT for one year with ASA and Brilinta. Will start high intensity statin. Continue beta blocker. Echo later today.   Recent Labs: 04/07/2022: TSH 1.640 10/01/2022: ALT 28; BUN 12; Creatinine, Ser 0.74; Potassium 3.8; Sodium 144  Recent Lipid Panel    Component Value Date/Time   CHOL 123 10/01/2022 0811   TRIG 62 10/01/2022 0811   HDL 52 10/01/2022 0811   CHOLHDL 2.4 10/01/2022 0811   CHOLHDL 2.9 02/02/2020 0830   VLDL 15 02/02/2020 0830   LDLCALC 58 10/01/2022 0811   LDLCALC 91 11/05/2017 0906     Risk Assessment/Calculations:      Physical Exam:    VS:  BP 108/80   Pulse 82   Ht '5\' 3"'$  (1.6 m)   Wt 293 lb 6.4 oz (133.1 kg)   SpO2 95%   BMI 51.97 kg/m     Wt Readings from Last 3 Encounters:  10/09/22 293 lb 6.4 oz (133.1 kg)  10/02/22 293 lb (132.9 kg)  05/21/22 294 lb (133.4 kg)     GEN: Obese, well developed in no acute distress HEENT: Normal NECK: No JVD; No carotid bruits CARDIAC: RRR, no murmurs, rubs, gallops RESPIRATORY:  Clear to auscultation without rales, wheezing or rhonchi  ABDOMEN: Soft, non-tender, non-distended MUSCULOSKELETAL:  No edema; No deformity. 2+ pedal pulses, equal bilaterally SKIN: Warm and dry NEUROLOGIC:  Alert and oriented x 3 PSYCHIATRIC:  Normal affect   EKG:  EKG is ordered today.  The ekg ordered today demonstrates normal sinus rhythm at 82 bpm, no acute ST abnormality   Diagnoses:    1. Coronary artery disease involving native coronary artery of native heart without angina pectoris   2. Essential hypertension   3. Hyperlipidemia LDL goal <70   4. Morbid obesity ([place])    Assessment and Plan:     CAD without angina: s/p anterior STEMI s/p DES to LAD 01/2020. She denies chest pain, dyspnea, or other symptoms concerning for angina.  No indication for further ischemic evaluation at this time. Secondary prevention emphasized including 150 minutes of moderate intensity  exercise each week, heart healthy, mostly plant-based diet, and weight loss. Maintain good BP and cholesterol control. Continue aspirin, atorvastatin, fenofibrate, carvedilol, amlodipine.   Hyperlipidemia LDL goal < 70: LDL 58, triglycerides 62 on 10/01/2022.  Continue atorvastatin, fenofibrate.  Hypertension: BP is well-controlled, somewhat soft. PCP has been monitoring closely and recent we added  carvedilol. We discussed the heart health benefit of carvedilol. No lightheadedness, fatigue, presyncope, syncope.   Morbid obesity: Encouraged 150 minutes of moderate intensity exercise each week and heart healthy diet.  She admits she drinks a lot of soda and frequently skips meals. Encouraged gradual reduction in soda consumption and to eat small frequent meals to avoid overeating later in the day. Heart healthy diet and lifestyle information provided.     Disposition: 1 year with Dr. Angelena Form or APP  Medication Adjustments/Labs and Tests Ordered: Current medicines are reviewed at length with the patient today.  Concerns regarding medicines are outlined above.  No orders of the defined types were placed in this encounter.  No orders of the defined types were placed in this encounter.   Patient Instructions  Medication Instructions:   Your physician recommends that you continue on your current medications as directed. Please refer to the Current Medication list given to you today.   *If you need a refill on your cardiac medications before your next appointment, please call your pharmacy*   Lab Work:  None ordered.  If you have labs (blood work) drawn today and your tests are completely normal, you will receive your results only by: [place] (if you have MyChart) OR A paper copy in the mail If you have any lab test that is abnormal or we need to change your treatment, we will call you to review the results.   Testing/Procedures:  None ordered.   Follow-Up: At [facility], you and your health needs are our priority.  As part of our continuing mission to provide you with exceptional heart care, we have created designated Provider Care Teams.  These Care Teams include your primary Cardiologist (physician) and Advanced Practice Providers (APPs -  Physician Assistants and Nurse Practitioners) who all work together to provide you with the care you need, when you need it.  We recommend signing up for the patient portal called "MyChart".  Sign up information is provided on this After Visit Summary.  MyChart is used to connect with patients for Virtual Visits (Telemedicine).  Patients are able to view lab/test results, encounter notes, upcoming appointments, etc.  Non-urgent messages can be sent to your provider as well.   To learn more about what you can do with MyChart, go to [email or web address].    Your next appointment:   1 year(s)  The format for your next appointment:   In Person  Provider:   [name], MD     Other Instructions  Your physician wants you to follow-up in: 1 year with Dr. Angelena Form. You will receive a reminder letter in the mail two months in advance. If you don't receive a letter, please call our office to schedule the follow-up appointment.  Mediterranean Diet A Mediterranean diet refers to food and lifestyle choices that are based on the traditions of countries located on the [employer]. It focuses on eating more fruits, vegetables, whole grains, beans, nuts, seeds, and heart-healthy fats, and eating less dairy, meat, eggs, and processed foods with added sugar, salt, and fat. This way of eating has been shown to help prevent certain conditions and improve outcomes for people who have chronic diseases, like kidney disease and heart disease. What are tips for following this plan? Reading food labels Check the serving size of packaged foods. For foods such as rice and pasta, the serving size refers to the amount of  cooked product, not dry. Check the total fat in packaged foods.  Avoid foods that have saturated fat or trans fats. Check the ingredient list for added sugars, such as corn syrup. Shopping  Buy a variety of foods that offer a balanced diet, including: Fresh fruits and vegetables (produce). Grains, beans, nuts, and seeds. Some of these may be available in unpackaged forms or large amounts (in bulk). Fresh seafood. Poultry and eggs. Low-fat dairy products. Buy whole ingredients instead of prepackaged foods. Buy fresh fruits and vegetables in-season from local farmers markets. Buy plain frozen fruits and vegetables. If you do not have access to quality fresh seafood, buy precooked frozen shrimp or canned fish, such as tuna, salmon, or sardines. Stock your pantry so you always have certain foods on hand, such as olive oil, canned tuna, canned tomatoes, rice, pasta, and beans. Cooking Cook foods with extra-virgin olive oil instead of using butter or other vegetable oils. Have meat as a side dish, and have vegetables or grains as your main dish. This means having meat in small portions or adding small amounts of meat to foods like pasta or stew. Use beans or vegetables instead of meat in common dishes like chili or lasagna. Experiment with different cooking methods. Try roasting, broiling, steaming, and sauting vegetables. Add frozen vegetables to soups, stews, pasta, or rice. Add nuts or seeds for added healthy fats and plant protein at each meal. You can add these to yogurt, salads, or vegetable dishes. Marinate fish or vegetables using olive oil, lemon juice, garlic, and fresh herbs. Meal planning Plan to eat one vegetarian meal one day each week. Try to work up to two vegetarian meals, if possible. Eat seafood two or more times a week. Have healthy snacks readily available, such as: Vegetable sticks with hummus. Greek yogurt. Fruit and nut trail mix. Eat balanced meals throughout the  week. This includes: Fruit: 2-3 servings a day. Vegetables: 4-5 servings a day. Low-fat dairy: 2 servings a day. Fish, poultry, or lean meat: 1 serving a day. Beans and legumes: 2 or more servings a week. Nuts and seeds: 1-2 servings a day. Whole grains: 6-8 servings a day. Extra-virgin olive oil: 3-4 servings a day. Limit red meat and sweets to only a few servings a month. Lifestyle  Cook and eat meals together with your family, when possible. Drink enough fluid to keep your urine pale yellow. Be physically active every day. This includes: Aerobic exercise like running or swimming. Leisure activities like gardening, walking, or housework. Get 7-8 hours of sleep each night. If recommended by your health care provider, drink red wine in moderation. This means 1 glass a day for nonpregnant women and 2 glasses a day for men. A glass of wine equals 5 oz (150 mL). What foods should I eat? Fruits Apples. Apricots. Avocado. Berries. Bananas. Cherries. Dates. Figs. Grapes. Lemons. Melon. Oranges. Peaches. Plums. Pomegranate. Vegetables Artichokes. Beets. Broccoli. Cabbage. Carrots. Eggplant. Green beans. Chard. Kale. Spinach. Onions. Leeks. Peas. Squash. Tomatoes. Peppers. Radishes. Grains Whole-grain pasta. Brown rice. Bulgur wheat. Polenta. Couscous. Whole-wheat bread. [place]. Meats and other proteins Beans. Almonds. Sunflower seeds. Pine nuts. Peanuts. [place]. Salmon. Scallops. Shrimp. [place]. Tilapia. Clams. Oysters. Eggs. Poultry without skin. Dairy Low-fat milk. Cheese. Greek yogurt. Fats and oils Extra-virgin olive oil. Avocado oil. Grapeseed oil. Beverages Water. Red wine. Herbal tea. Sweets and desserts Greek yogurt with honey. Baked apples. Poached pears. Trail mix. Seasonings and condiments Basil. Cilantro. Coriander. Cumin. Mint. Parsley. Sage. Rosemary. Tarragon. Garlic. Oregano. Thyme. Pepper. Balsamic vinegar. Tahini. Hummus. Tomato sauce. Olives. Mushrooms. The items  listed above may not be a complete list of foods and beverages you can eat. Contact a dietitian for more information. What foods should I limit? This is a list of foods that should be eaten rarely or only on special occasions. Fruits Fruit canned in syrup. Vegetables Deep-fried potatoes (french fries). Grains Prepackaged pasta or rice dishes. Prepackaged cereal with added sugar. Prepackaged snacks with added sugar. Meats and other proteins Beef. Pork. Lamb. Poultry with skin. Hot dogs. Berniece Salines. Dairy Ice cream. Sour cream. Whole milk. Fats and oils Butter. Canola oil. Vegetable oil. Beef fat (tallow). Lard. Beverages Juice. Sugar-sweetened soft drinks. Beer. Liquor and spirits. Sweets and desserts Cookies. Cakes. Pies. Candy. Seasonings and condiments Mayonnaise. Pre-made sauces and marinades. The items listed above may not be a complete list of foods and beverages you should limit. Contact a dietitian for more information. Summary The Mediterranean diet includes both food and lifestyle choices. Eat a variety of fresh fruits and vegetables, beans, nuts, seeds, and whole grains. Limit the amount of red meat and sweets that you eat. If recommended by your health care provider, drink red wine in moderation. This means 1 glass a day for nonpregnant women and 2 glasses a day for men. A glass of wine equals 5 oz (150 mL). This information is not intended to replace advice given to you by your health care provider. Make sure you discuss any questions you have with your health care provider. Document Revised: 11/04/2019 Document Reviewed: 09/01/2019 Elsevier Patient Education  [place]. Adopting a Healthy Lifestyle.   Weight: Know what a healthy weight is for you (roughly BMI <25) and aim to maintain this. You can calculate your body mass index on your smart phone  Diet: Aim for 7+ servings of fruits and vegetables daily Limit animal fats in diet for cholesterol and heart health -  choose grass fed whenever available Avoid highly processed foods (fast food burgers, tacos, fried chicken, pizza, hot dogs, french fries)  Saturated fat comes in the form of butter, lard, coconut oil, margarine, partially hydrogenated oils, and fat in meat. These increase your risk of cardiovascular disease.  Use healthy plant oils, such as olive, canola, soy, corn, sunflower and peanut.  Whole foods such as fruits, vegetables and whole grains have fiber  Men need > 38 grams of fiber per day Women need > 25 grams of fiber per day  Load up on vegetables and fruits - one-half of your plate: Aim for color and variety, and remember that potatoes dont count. Go for whole grains - one-quarter of your plate: Whole wheat, barley, wheat berries, quinoa, oats, brown rice, and foods made with them. If you want pasta, go with whole wheat pasta. Protein power - one-quarter of your plate: Fish, chicken, beans, and nuts are all healthy, versatile protein sources. Limit red meat. You need carbohydrates for energy! The type of carbohydrate is more important than the amount. Choose carbohydrates such as vegetables, fruits, whole grains, beans, and nuts in the place of white rice, white pasta, potatoes (baked or fried), macaroni and cheese, cakes, cookies, and donuts.  If youre thirsty, drink water. Coffee and tea are good in moderation, but skip sugary drinks and limit milk and dairy products to one or two daily servings. Keep sugar intake at 6 teaspoons or 24 grams or LESS       Exercise: Aim for 150 min of moderate intensity exercise weekly for heart health, and weights twice weekly for bone health Stay active -  any steps are better than no steps! Aim for 7-9 hours of sleep daily         Important Information About Sugar         Signed, Emmaline Life, NP  10/09/2022 2:48 PM    [place]

## 2022-10-08 NOTE — Addendum Note (Signed)
Addended by: Tula Nakayama E on: 10/08/2022 10:34 PM   Modules accepted: Orders

## 2022-10-09 ENCOUNTER — Other Ambulatory Visit: Payer: Self-pay

## 2022-10-09 ENCOUNTER — Ambulatory Visit: Payer: BC Managed Care – PPO | Attending: Nurse Practitioner | Admitting: Nurse Practitioner

## 2022-10-09 ENCOUNTER — Encounter: Payer: Self-pay | Admitting: Nurse Practitioner

## 2022-10-09 VITALS — BP 108/80 | HR 82 | Ht 63.0 in | Wt 293.4 lb

## 2022-10-09 DIAGNOSIS — I10 Essential (primary) hypertension: Secondary | ICD-10-CM

## 2022-10-09 DIAGNOSIS — I2510 Atherosclerotic heart disease of native coronary artery without angina pectoris: Secondary | ICD-10-CM

## 2022-10-09 DIAGNOSIS — Z114 Encounter for screening for human immunodeficiency virus [HIV]: Secondary | ICD-10-CM

## 2022-10-09 DIAGNOSIS — E785 Hyperlipidemia, unspecified: Secondary | ICD-10-CM

## 2022-10-09 NOTE — Patient Instructions (Signed)
Medication Instructions:   Your physician recommends that you continue on your current medications as directed. Please refer to the Current Medication list given to you today.   *If you need a refill on your cardiac medications before your next appointment, please call your pharmacy*   Lab Work:  None ordered.  If you have labs (blood work) drawn today and your tests are completely normal, you will receive your results only by: [place] (if you have MyChart) OR A paper copy in the mail If you have any lab test that is abnormal or we need to change your treatment, we will call you to review the results.   Testing/Procedures:  None ordered.   Follow-Up: At [facility], you and your health needs are our priority.  As part of our continuing mission to provide you with exceptional heart care, we have created designated Provider Care Teams.  These Care Teams include your primary Cardiologist (physician) and Advanced Practice Providers (APPs -  Physician Assistants and Nurse Practitioners) who all work together to provide you with the care you need, when you need it.  We recommend signing up for the patient portal called "MyChart".  Sign up information is provided on this After Visit Summary.  MyChart is used to connect with patients for Virtual Visits (Telemedicine).  Patients are able to view lab/test results, encounter notes, upcoming appointments, etc.  Non-urgent messages can be sent to your provider as well.   To learn more about what you can do with MyChart, go to [email or web address].    Your next appointment:   1 year(s)  The format for your next appointment:   In Person  Provider:   [name], MD     Other Instructions  Your physician wants you to follow-up in: 1 year with Dr. Angelena Form. You will receive a reminder letter in the mail two months in advance. If you don't receive a letter, please call our office to schedule the follow-up  appointment.  Mediterranean Diet A Mediterranean diet refers to food and lifestyle choices that are based on the traditions of countries located on the [employer]. It focuses on eating more fruits, vegetables, whole grains, beans, nuts, seeds, and heart-healthy fats, and eating less dairy, meat, eggs, and processed foods with added sugar, salt, and fat. This way of eating has been shown to help prevent certain conditions and improve outcomes for people who have chronic diseases, like kidney disease and heart disease. What are tips for following this plan? Reading food labels Check the serving size of packaged foods. For foods such as rice and pasta, the serving size refers to the amount of cooked product, not dry. Check the total fat in packaged foods. Avoid foods that have saturated fat or trans fats. Check the ingredient list for added sugars, such as corn syrup. Shopping  Buy a variety of foods that offer a balanced diet, including: Fresh fruits and vegetables (produce). Grains, beans, nuts, and seeds. Some of these may be available in unpackaged forms or large amounts (in bulk). Fresh seafood. Poultry and eggs. Low-fat dairy products. Buy whole ingredients instead of prepackaged foods. Buy fresh fruits and vegetables in-season from local farmers markets. Buy plain frozen fruits and vegetables. If you do not have access to quality fresh seafood, buy precooked frozen shrimp or canned fish, such as tuna, salmon, or sardines. Stock your pantry so you always have certain foods on hand, such as olive oil, canned tuna, canned tomatoes, rice, pasta, and beans. Cooking  Cook foods with extra-virgin olive oil instead of using butter or other vegetable oils. Have meat as a side dish, and have vegetables or grains as your main dish. This means having meat in small portions or adding small amounts of meat to foods like pasta or stew. Use beans or vegetables instead of meat in common dishes like  chili or lasagna. Experiment with different cooking methods. Try roasting, broiling, steaming, and sauting vegetables. Add frozen vegetables to soups, stews, pasta, or rice. Add nuts or seeds for added healthy fats and plant protein at each meal. You can add these to yogurt, salads, or vegetable dishes. Marinate fish or vegetables using olive oil, lemon juice, garlic, and fresh herbs. Meal planning Plan to eat one vegetarian meal one day each week. Try to work up to two vegetarian meals, if possible. Eat seafood two or more times a week. Have healthy snacks readily available, such as: Vegetable sticks with hummus. Greek yogurt. Fruit and nut trail mix. Eat balanced meals throughout the week. This includes: Fruit: 2-3 servings a day. Vegetables: 4-5 servings a day. Low-fat dairy: 2 servings a day. Fish, poultry, or lean meat: 1 serving a day. Beans and legumes: 2 or more servings a week. Nuts and seeds: 1-2 servings a day. Whole grains: 6-8 servings a day. Extra-virgin olive oil: 3-4 servings a day. Limit red meat and sweets to only a few servings a month. Lifestyle  Cook and eat meals together with your family, when possible. Drink enough fluid to keep your urine pale yellow. Be physically active every day. This includes: Aerobic exercise like running or swimming. Leisure activities like gardening, walking, or housework. Get 7-8 hours of sleep each night. If recommended by your health care provider, drink red wine in moderation. This means 1 glass a day for nonpregnant women and 2 glasses a day for men. A glass of wine equals 5 oz (150 mL). What foods should I eat? Fruits Apples. Apricots. Avocado. Berries. Bananas. Cherries. Dates. Figs. Grapes. Lemons. Melon. Oranges. Peaches. Plums. Pomegranate. Vegetables Artichokes. Beets. Broccoli. Cabbage. Carrots. Eggplant. Green beans. Chard. Kale. Spinach. Onions. Leeks. Peas. Squash. Tomatoes. Peppers. Radishes. Grains Whole-grain  pasta. Brown rice. Bulgur wheat. Polenta. Couscous. Whole-wheat bread. [place]. Meats and other proteins Beans. Almonds. Sunflower seeds. Pine nuts. Peanuts. [place]. Salmon. Scallops. Shrimp. [place]. Tilapia. Clams. Oysters. Eggs. Poultry without skin. Dairy Low-fat milk. Cheese. Greek yogurt. Fats and oils Extra-virgin olive oil. Avocado oil. Grapeseed oil. Beverages Water. Red wine. Herbal tea. Sweets and desserts Greek yogurt with honey. Baked apples. Poached pears. Trail mix. Seasonings and condiments Basil. Cilantro. Coriander. Cumin. Mint. Parsley. Sage. Rosemary. Tarragon. Garlic. Oregano. Thyme. Pepper. Balsamic vinegar. Tahini. Hummus. Tomato sauce. Olives. Mushrooms. The items listed above may not be a complete list of foods and beverages you can eat. Contact a dietitian for more information. What foods should I limit? This is a list of foods that should be eaten rarely or only on special occasions. Fruits Fruit canned in syrup. Vegetables Deep-fried potatoes (french fries). Grains Prepackaged pasta or rice dishes. Prepackaged cereal with added sugar. Prepackaged snacks with added sugar. Meats and other proteins Beef. Pork. Lamb. Poultry with skin. Hot dogs. [place] Salines. Dairy Ice cream. Sour cream. Whole milk. Fats and oils Butter. Canola oil. Vegetable oil. Beef fat (tallow). Lard. Beverages Juice. Sugar-sweetened soft drinks. Beer. Liquor and spirits. Sweets and desserts Cookies. Cakes. Pies. Candy. Seasonings and condiments Mayonnaise. Pre-made sauces and marinades. The items listed above may not be a complete list  of foods and beverages you should limit. Contact a dietitian for more information. Summary The Mediterranean diet includes both food and lifestyle choices. Eat a variety of fresh fruits and vegetables, beans, nuts, seeds, and whole grains. Limit the amount of red meat and sweets that you eat. If recommended by your health care provider, drink red wine in  moderation. This means 1 glass a day for nonpregnant women and 2 glasses a day for men. A glass of wine equals 5 oz (150 mL). This information is not intended to replace advice given to you by your health care provider. Make sure you discuss any questions you have with your health care provider. Document Revised: 11/04/2019 Document Reviewed: 09/01/2019 Elsevier Patient Education  [place]. Adopting a Healthy Lifestyle.   Weight: Know what a healthy weight is for you (roughly BMI <25) and aim to maintain this. You can calculate your body mass index on your smart phone  Diet: Aim for 7+ servings of fruits and vegetables daily Limit animal fats in diet for cholesterol and heart health - choose grass fed whenever available Avoid highly processed foods (fast food burgers, tacos, fried chicken, pizza, hot dogs, french fries)  Saturated fat comes in the form of butter, lard, coconut oil, margarine, partially hydrogenated oils, and fat in meat. These increase your risk of cardiovascular disease.  Use healthy plant oils, such as olive, canola, soy, corn, sunflower and peanut.  Whole foods such as fruits, vegetables and whole grains have fiber  Men need > 38 grams of fiber per day Women need > 25 grams of fiber per day  Load up on vegetables and fruits - one-half of your plate: Aim for color and variety, and remember that potatoes dont count. Go for whole grains - one-quarter of your plate: Whole wheat, barley, wheat berries, quinoa, oats, brown rice, and foods made with them. If you want pasta, go with whole wheat pasta. Protein power - one-quarter of your plate: Fish, chicken, beans, and nuts are all healthy, versatile protein sources. Limit red meat. You need carbohydrates for energy! The type of carbohydrate is more important than the amount. Choose carbohydrates such as vegetables, fruits, whole grains, beans, and nuts in the place of white rice, white pasta, potatoes (baked or fried),  macaroni and cheese, cakes, cookies, and donuts.  If youre thirsty, drink water. Coffee and tea are good in moderation, but skip sugary drinks and limit milk and dairy products to one or two daily servings. Keep sugar intake at 6 teaspoons or 24 grams or LESS       Exercise: Aim for 150 min of moderate intensity exercise weekly for heart health, and weights twice weekly for bone health Stay active - any steps are better than no steps! Aim for 7-9 hours of sleep daily         Important Information About Sugar

## 2022-10-10 ENCOUNTER — Other Ambulatory Visit (INDEPENDENT_AMBULATORY_CARE_PROVIDER_SITE_OTHER): Payer: BC Managed Care – PPO

## 2022-10-10 DIAGNOSIS — I10 Essential (primary) hypertension: Secondary | ICD-10-CM

## 2022-10-10 DIAGNOSIS — I2510 Atherosclerotic heart disease of native coronary artery without angina pectoris: Secondary | ICD-10-CM | POA: Diagnosis not present

## 2022-10-29 NOTE — Progress Notes (Deleted)
Advanced Hypertension Clinic Initial Assessment:    Date:  10/29/2022   ID:  [name], DOB 1971-06-25, MRN [id number]  PCP:  Fayrene Helper, MD  Cardiologist:  [name], MD  Nephrologist:  Referring MD: Fayrene Helper, MD   CC: Hypertension  History of Present Illness:    [name] is a 52 y.o. female with a hx of *** here to establish care in the Advanced Hypertension Clinic.   [name] was diagnosed with hypertension ***. It has been {Blank single:19197::"easy","difficult","***"} to control. Blood pressure {Blank single:19197::"not checked routinely at home","checked with arm cuff at home","checked with wrist cuff at home","***"}. Readings have been ***. she reports tobacco use ***. Alcohol use ***. For exercise she ***. she eats {Blank multiple:19196::"***","at home","outside of the home"} and {Blank single:19197::"does","does not"} follow low sodium diet.   Previous antihypertensives:  Secondary Causes of Hypertension  Medications/Herbal: OCP, steroids, stimulants, antidepressants, weight loss medication, immune suppressants, NSAIDs, sympathomimetics, alcohol, caffeine, licorice, ginseng, St. John's wort, chemo  Sleep Apnea Renal artery stenosis Hyperaldosteronism Hyper/hypothyroidism Pheochromocytoma: palpitations, tachycardia, headache, diaphoresis (plasma metanephrines) Cushing's syndrome: Cushingoid facies, central obesity, proximal muscle weakness, and ecchymoses, adrenal incidentaloma (cortisol) Coarctation of the aorta  Past Medical History:  Diagnosis Date   CAD (coronary artery disease), native coronary artery    s/p DES to LAD 02/01/20   Chest pain 01/2020   Facial numbness    Fibroid    Fx ankle    Hypertension    Myocardial infarction ([place])    Obesity    PONV (postoperative nausea and vomiting)     Past Surgical History:  Procedure Laterality Date   APPENDECTOMY     APPLICATION OF CRANIAL NAVIGATION N/A 08/16/2019    Procedure: APPLICATION OF CRANIAL NAVIGATION;  Surgeon: Judith Part, MD;  Location: [place];  Service: Neurosurgery;  Laterality: N/A;   bilateral breast reduction  2004   CHOLECYSTECTOMY     COLONOSCOPY WITH PROPOFOL N/A 05/07/2021   Procedure: COLONOSCOPY WITH PROPOFOL;  Surgeon: Harvel Quale, MD;  [facility];  Service: Gastroenterology;  Laterality: N/A;  7:30   CORONARY STENT INTERVENTION N/A 02/01/2020   Procedure: CORONARY STENT INTERVENTION;  Surgeon: Burnell Blanks, MD;  Location: [place] [facility];  Service: Cardiovascular;  Laterality: N/A;   CRANIOTOMY Left 08/16/2019   Procedure: Left craniotomy for tumor resection;  Surgeon: Judith Part, MD;  Location: [place];  Service: Neurosurgery;  Laterality: Left;  Left craniotomy for tumor resection   INTRAVASCULAR PRESSURE WIRE/FFR STUDY N/A 02/01/2020   Procedure: INTRAVASCULAR PRESSURE WIRE/FFR STUDY;  Surgeon: Burnell Blanks, MD;  Location: [place] [facility];  Service: Cardiovascular;  Laterality: N/A;   LEFT HEART CATH AND CORONARY ANGIOGRAPHY N/A 02/01/2020   Procedure: LEFT HEART CATH AND CORONARY ANGIOGRAPHY;  Surgeon: Burnell Blanks, MD;  Location: [place] [facility];  Service: Cardiovascular;  Laterality: N/A;   POLYPECTOMY  05/07/2021   Procedure: POLYPECTOMY;  Surgeon: Montez Morita, Quillian Quince, MD;  [facility];  Service: Gastroenterology;;   REDUCTION MAMMAPLASTY Bilateral    removal of left ovarian cyst  2003   SALPINGECTOMY     TUBAL LIGATION  2011   Women's, removal of tubes 2013    Current Medications: No outpatient medications have been marked as taking for the 10/30/22 encounter (Appointment) with Loel Dubonnet, NP.     Allergies:   Lisinopril   Social History   Socioeconomic History   Marital status:  Single    Spouse name: Not on file   Number of children: 4   Years of education: Not on file   Highest education level: Associate  degree: occupational, Hotel manager, or vocational program  Occupational History   Not on file  Tobacco Use   Smoking status: Never   Smokeless tobacco: Never  Vaping Use   Vaping Use: Never used  Substance and Sexual Activity   Alcohol use: Yes    Comment: rarely   Drug use: No   Sexual activity: Yes    Birth control/protection: Surgical  Other Topics Concern   Not on file  Social History Narrative   Lives with children   Caffeine- sodas, 48 oz daily   Social Determinants of Health   Financial Resource Strain: Not on file  Food Insecurity: Not on file  Transportation Needs: Not on file  Physical Activity: Not on file  Stress: Not on file  Social Connections: Not on file     Family History: The patient's ***family history includes Coronary artery disease in her father and mother; Heart attack in her maternal grandfather; Hypertension in her father, mother, and sister; Kidney disease in her father; Multiple sclerosis in her sister; Stroke in her maternal grandmother. There is no history of Anesthesia problems, Hypotension, Malignant hyperthermia, or Pseudochol deficiency.  ROS:   Please see the history of present illness.    *** All other systems reviewed and are negative.  EKGs/Labs/Other Studies Reviewed:    EKG:  EKG is *** ordered today.  The ekg ordered today demonstrates ***  Echo 02/01/20  1. Left ventricular ejection fraction, by estimation, is 55 to 60%. The  left ventricle has normal function. The left ventricle has no regional  wall motion abnormalities. There is moderate left ventricular hypertrophy.  Left ventricular diastolic  parameters are consistent with Grade I diastolic dysfunction (impaired  relaxation).   2. Right ventricular systolic function is normal. The right ventricular  size is normal. Tricuspid regurgitation signal is inadequate for assessing  PA pressure.   3. The mitral valve is grossly normal. No evidence of mitral valve  regurgitation. No  evidence of mitral stenosis.   4. The aortic valve is tricuspid. Aortic valve regurgitation is not  visualized. No aortic stenosis is present.   5. The inferior vena cava is normal in size with greater than 50%  respiratory variability, suggesting right atrial pressure of 3 mmHg.    LHC 02/02/20 Mid LAD lesion is 80% stenosed. A drug-eluting stent was successfully placed using a STENT RESOLUTE ONYX 3.5X18. Post intervention, there is a 0% residual stenosis. The left ventricular systolic function is normal. LV end diastolic pressure is normal. The left ventricular ejection fraction is 55-65% by visual estimate. There is no mitral valve regurgitation.   1. Severe stenosis mid LAD. Flow limiting by DFR 0.86.  2. Successful PTCA/DES x 1 mid LAD 3. No obstructive disease in the RCA or Circumflex 4. Normal LV systolic function   Recommendations: Continue DAPT for one year with ASA and Brilinta. Will start high intensity statin. Continue beta blocker. Echo later today.   Recent Labs: 04/07/2022: TSH 1.640 10/01/2022: ALT 28; BUN 12; Creatinine, Ser 0.74; Potassium 3.8; Sodium 144   Recent Lipid Panel    Component Value Date/Time   CHOL 123 10/01/2022 0811   TRIG 62 10/01/2022 0811   HDL 52 10/01/2022 0811   CHOLHDL 2.4 10/01/2022 0811   CHOLHDL 2.9 02/02/2020 0830   VLDL 15 02/02/2020 0830  Brave 58 10/01/2022 0811   LDLCALC 91 11/05/2017 0906    Physical Exam:   VS:  There were no vitals taken for this visit. , BMI There is no height or weight on file to calculate BMI. GENERAL:  Well appearing HEENT: Pupils equal round and reactive, fundi not visualized, oral mucosa unremarkable NECK:  No jugular venous distention, waveform within normal limits, carotid upstroke brisk and symmetric, no bruits, no thyromegaly LYMPHATICS:  No cervical adenopathy LUNGS:  Clear to auscultation bilaterally HEART:  RRR.  PMI not displaced or sustained,S1 and S2 within normal limits, no S3, no S4,  no clicks, no rubs, *** murmurs ABD:  Flat, positive bowel sounds normal in frequency in pitch, no bruits, no rebound, no guarding, no midline pulsatile mass, no hepatomegaly, no splenomegaly EXT:  2 plus pulses throughout, no edema, no cyanosis no clubbing SKIN:  No rashes no nodules NEURO:  Cranial nerves II through XII grossly intact, motor grossly intact throughout PSYCH:  Cognitively intact, oriented to person place and time   ASSESSMENT/PLAN:    HTN -  CAD -  HLD, LDL goal <70 -   Screening for Secondary Hypertension: { Click here to document screening for secondary causes of HTN  :[id number]    Relevant Labs/Studies:    Latest Ref Rng & Units 10/01/2022    8:11 AM 04/07/2022   11:18 AM 10/30/2021    7:29 PM  Basic Labs  Sodium 134 - 144 mmol/L 144  139    Potassium 3.5 - 5.2 mmol/L 3.8  3.7    Creatinine 0.57 - 1.00 mg/dL 0.74  0.71  0.50        Latest Ref Rng & Units 04/07/2022   11:18 AM 10/18/2020    9:48 AM  Thyroid   TSH 0.450 - 4.500 uIU/mL 1.640  1.400        Latest Ref Rng & Units 01/03/2014    8:54 AM  Renin/Aldosterone   Aldosterone ng/dL 9                  she consents to be monitored in our remote patient monitoring program through [place].  she will track his blood pressure twice daily and understands that these trends will help [place] to adjust her medications as needed prior to his next appointment.  she *** interested in enrolling in the PREP exercise and nutrition program through the [facility].     Disposition:    FU with MD/PharmD in {gen number [id number] {Days to years:10300}    Medication Adjustments/Labs and Tests Ordered: Current medicines are reviewed at length with the patient today.  Concerns regarding medicines are outlined above.  No orders of the defined types were placed in this encounter.  No orders of the defined types were placed in this encounter.    Signed, Loel Dubonnet, NP  10/29/2022 8:40 PM    [facility]

## 2022-10-30 ENCOUNTER — Ambulatory Visit (HOSPITAL_BASED_OUTPATIENT_CLINIC_OR_DEPARTMENT_OTHER): Payer: BC Managed Care – PPO | Admitting: Family

## 2022-11-06 ENCOUNTER — Encounter: Payer: Self-pay | Admitting: Family Medicine

## 2022-11-06 ENCOUNTER — Ambulatory Visit: Payer: BC Managed Care – PPO | Admitting: Family Medicine

## 2022-11-06 VITALS — BP 128/82 | HR 86 | Ht 63.0 in | Wt 297.0 lb

## 2022-11-06 DIAGNOSIS — Z113 Encounter for screening for infections with a predominantly sexual mode of transmission: Secondary | ICD-10-CM | POA: Diagnosis not present

## 2022-11-06 DIAGNOSIS — Z23 Encounter for immunization: Secondary | ICD-10-CM | POA: Diagnosis not present

## 2022-11-06 DIAGNOSIS — E785 Hyperlipidemia, unspecified: Secondary | ICD-10-CM | POA: Diagnosis not present

## 2022-11-06 DIAGNOSIS — N898 Other specified noninflammatory disorders of vagina: Secondary | ICD-10-CM

## 2022-11-06 DIAGNOSIS — I10 Essential (primary) hypertension: Secondary | ICD-10-CM | POA: Diagnosis not present

## 2022-11-06 DIAGNOSIS — Z114 Encounter for screening for human immunodeficiency virus [HIV]: Secondary | ICD-10-CM | POA: Diagnosis not present

## 2022-11-06 DIAGNOSIS — Z1231 Encounter for screening mammogram for malignant neoplasm of breast: Secondary | ICD-10-CM

## 2022-11-06 NOTE — Assessment & Plan Note (Signed)
Controlled, no change in medication DASH diet and commitment to daily physical activity for a minimum of 30 minutes discussed and encouraged, as a part of hypertension management. The importance of attaining a healthy weight is also discussed.     11/06/2022    2:09 PM 11/06/2022    1:38 PM 10/09/2022    2:13 PM 10/02/2022    2:35 PM 10/02/2022    2:32 PM 10/02/2022    1:52 PM 10/02/2022    1:50 PM  BP/Weight  Systolic BP 322 567 209 198 022 179 810  Diastolic BP 82 87 80 96 92 86 97  Wt. (Lbs)  297.04 293.4    293  BMI  52.62 kg/m2 51.97 kg/m2    51.9 kg/m2

## 2022-11-06 NOTE — Patient Instructions (Addendum)
F/U in 4 months, call if you need me sooner  Flu vaccine today  You need to be enrolled in weight watches or similar before you can get weight loss medication discussed HIV, RPR, HSV2 today please  Please get fasting lipid, cmp and EGFR and hBA1C 3 to 5 days before next visit  Please schedule mammogram at checkout  It is important that you exercise regularly at least 30 minutes 5 times a week. If you develop chest pain, have severe difficulty breathing, or feel very tired, stop exercising immediately and seek medical attention   Stop the sodas please!, also trade out cooking !

## 2022-11-06 NOTE — Assessment & Plan Note (Signed)
Deteriorated,her ins requires 3 months enrollment in weight watchers or similar before covering medication  Patient re-educated about  the importance of commitment to a  minimum of 150 minutes of exercise per week as able.  The importance of healthy food choices with portion control discussed, as well as eating regularly and within a 12 hour window most days. The need to choose "clean , green" food 50 to 75% of the time is discussed, as well as to make water the primary drink and set a goal of 64 ounces water daily.       11/06/2022    1:38 PM 10/09/2022    2:13 PM 10/02/2022    1:50 PM  Weight /BMI  Weight 297 lb 0.6 oz 293 lb 6.4 oz 293 lb  Height '5\' 3"'$  (1.6 m) '5\' 3"'$  (1.6 m) '5\' 3"'$  (1.6 m)  BMI 52.62 kg/m2 51.97 kg/m2 51.9 kg/m2

## 2022-11-06 NOTE — Progress Notes (Signed)
[name]     MRN: [id number]      DOB: 04-01-1971   HPI [name] is here for follow up and re-evaluation of chronic medical conditions, medication management and review of any available recent lab and radiology data.  Preventive health is updated, specifically  Cancer screening and Immunization.   Questions or concerns regarding consultations or procedures which the PT has had in the interim are  addressed. The PT denies any adverse reactions to current medications since the last visit.  There are no new concerns.  There are no specific complaints   ROS Denies recent fever or chills. Denies sinus pressure, nasal congestion, ear pain or sore throat. Denies chest congestion, productive cough or wheezing. Denies chest pains, palpitations and leg swelling Denies abdominal pain, nausea, vomiting,diarrhea or constipation.   Denies dysuria, frequency, hesitancy or incontinence. Denies joint pain, swelling and limitation in mobility. Denies headaches, seizures, numbness, or tingling. Denies depression, anxiety or insomnia. Denies skin break down or rash.   PE  BP 128/82   Pulse 86   Ht '5\' 3"'$  (1.6 m)   Wt 297 lb 0.6 oz (134.7 kg)   SpO2 92%   BMI 52.62 kg/m   Patient alert and oriented and in no cardiopulmonary distress.  HEENT: No facial asymmetry, EOMI,     Neck supple .  Chest: Clear to auscultation bilaterally.  CVS: S1, S2 no murmurs, no S3.Regular rate.  ABD: Soft non tender.   Ext: No edema  MS: Adequate ROM spine, shoulders, hips and knees.  Skin: Intact, no ulcerations or rash noted.  Psych: Good eye contact, normal affect. Memory intact not anxious or depressed appearing.  CNS: CN 2-12 intact, power,  normal throughout.no focal deficits noted.   Assessment & Plan  Malignant hypertension Controlled, no change in medication DASH diet and commitment to daily physical activity for a minimum of 30 minutes discussed and encouraged, as a part of hypertension  management. The importance of attaining a healthy weight is also discussed.     11/06/2022    2:09 PM 11/06/2022    1:38 PM 10/09/2022    2:13 PM 10/02/2022    2:35 PM 10/02/2022    2:32 PM 10/02/2022    1:52 PM 10/02/2022    1:50 PM  BP/Weight  Systolic BP 119 417 408 144 818 563 149  Diastolic BP 82 87 80 96 92 86 97  Wt. (Lbs)  297.04 293.4    293  BMI  52.62 kg/m2 51.97 kg/m2    51.9 kg/m2       Morbid obesity Deteriorated,her ins requires 3 months enrollment in weight watchers or similar before covering medication  Patient re-educated about  the importance of commitment to a  minimum of 150 minutes of exercise per week as able.  The importance of healthy food choices with portion control discussed, as well as eating regularly and within a 12 hour window most days. The need to choose "clean , green" food 50 to 75% of the time is discussed, as well as to make water the primary drink and set a goal of 64 ounces water daily.       11/06/2022    1:38 PM 10/09/2022    2:13 PM 10/02/2022    1:50 PM  Weight /BMI  Weight 297 lb 0.6 oz 293 lb 6.4 oz 293 lb  Height '5\' 3"'$  (1.6 m) '5\' 3"'$  (1.6 m) '5\' 3"'$  (1.6 m)  BMI 52.62 kg/m2 51.97 kg/m2 51.9 kg/m2  Vaginal discharge Trichomonas treated, currently asymptomatic, want additional STD testing , will test for syphillis, HIV and HSV2   Hyperlipidemia LDL goal <70 Hyperlipidemia:Low fat diet discussed and encouraged.   Lipid Panel  Lab Results  Component Value Date   CHOL 123 10/01/2022   HDL 52 10/01/2022   LDLCALC 58 10/01/2022   TRIG 62 10/01/2022   CHOLHDL 2.4 10/01/2022     Controlled, no change in medication

## 2022-11-06 NOTE — Assessment & Plan Note (Signed)
Hyperlipidemia:Low fat diet discussed and encouraged.   Lipid Panel  Lab Results  Component Value Date   CHOL 123 10/01/2022   HDL 52 10/01/2022   LDLCALC 58 10/01/2022   TRIG 62 10/01/2022   CHOLHDL 2.4 10/01/2022     Controlled, no change in medication

## 2022-11-06 NOTE — Assessment & Plan Note (Signed)
Trichomonas treated, currently asymptomatic, want additional STD testing , will test for syphillis, HIV and HSV2

## 2022-11-07 LAB — HSV 1 AND 2 AB, IGG
HSV 1 Glycoprotein G Ab, IgG: 37.2 index — ABNORMAL HIGH (ref 0.00–0.90)
HSV 2 IgG, Type Spec: 20.9 index — ABNORMAL HIGH (ref 0.00–0.90)

## 2022-11-07 LAB — HIV ANTIBODY (ROUTINE TESTING W REFLEX): HIV Screen 4th Generation wRfx: NONREACTIVE

## 2022-11-07 LAB — RPR: RPR Ser Ql: NONREACTIVE

## 2022-11-08 ENCOUNTER — Encounter: Payer: Self-pay | Admitting: Family Medicine

## 2022-11-10 ENCOUNTER — Ambulatory Visit (HOSPITAL_COMMUNITY)
Admission: RE | Admit: 2022-11-10 | Discharge: 2022-11-10 | Disposition: A | Discharge status: Home / Self Care | Payer: BC Managed Care – PPO | Source: Ambulatory Visit | Attending: Family Medicine | Admitting: Family Medicine

## 2022-11-10 DIAGNOSIS — Z1231 Encounter for screening mammogram for malignant neoplasm of breast: Secondary | ICD-10-CM | POA: Diagnosis not present

## 2022-11-13 ENCOUNTER — Encounter (HOSPITAL_BASED_OUTPATIENT_CLINIC_OR_DEPARTMENT_OTHER): Payer: Self-pay | Admitting: Family

## 2022-11-13 ENCOUNTER — Ambulatory Visit (HOSPITAL_BASED_OUTPATIENT_CLINIC_OR_DEPARTMENT_OTHER): Payer: BC Managed Care – PPO | Admitting: Family

## 2022-11-13 VITALS — BP 115/82 | HR 60 | Ht 63.0 in | Wt 300.0 lb

## 2022-11-13 DIAGNOSIS — I25118 Atherosclerotic heart disease of native coronary artery with other forms of angina pectoris: Secondary | ICD-10-CM | POA: Diagnosis not present

## 2022-11-13 DIAGNOSIS — R0683 Snoring: Secondary | ICD-10-CM

## 2022-11-13 DIAGNOSIS — E785 Hyperlipidemia, unspecified: Secondary | ICD-10-CM

## 2022-11-13 DIAGNOSIS — I10 Essential (primary) hypertension: Secondary | ICD-10-CM

## 2022-11-13 MED ORDER — VALSARTAN 80 MG PO TABS: 80.0000 mg | ORAL_TABLET | Freq: Every day | ORAL | 6 refills | Status: DC

## 2022-11-13 NOTE — Progress Notes (Signed)
[facility] Initial Assessment:    Date:  11/14/2022   ID:  [name], DOB 11-06-1970, MRN [id number]  PCP:  Fayrene Helper, MD  Cardiologist:  [name], MD  Nephrologist:  Referring MD: Fayrene Helper, MD   CC: Hypertension  History of Present Illness:    [name] is a 52 y.o. female with a hx of CAD s/p anterior STEMI 01/2020 with DES-LAD, HTN, obesity, HLD. She is here to establish care in the [facility]. Referred by her primary care provider. She follows with Dr. Angelena Form of cardiology.  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 [name] was diagnosed with hypertension in 2000 when she was pregnant with her daughter (this was her second pregnancy). It has been difficult to control. Blood pressure checked with wrist cuff at home which has been checked for accuracy. Readings have been 130s/90s-150s/100s. Notes elevated readings are usually after working 15 hour shifts . she reports tobacco use never. Alcohol use socially. For exercise she she works out on her treadmill as well as foot press. She works out twice per week and is working to increase. she eats at home and outside of the home and does follow low sodium diet. Does drink predominantly Sun Drop.  Her daughter notes she does snore intermittently. Has to get up at 4AM due to work and goes to bed at 8 PM. She does feel she can take a nap most days.   She takes Clonidine only in the evening. Felt groggy on BID dosing but was on patch many years ago and felt well. She is not sure why patch was stopped. Previous intolerance to Lisinopril with cough.   Previous antihypertensives: Lisinopril - cough Methyldopa (aldomet) - during pregnancy   Past Medical History:  Diagnosis Date   CAD (coronary artery disease), native coronary artery    s/p DES to LAD 02/01/20   Chest pain 01/2020   Facial numbness    Fibroid    Fx ankle    Hypertension     Myocardial infarction ([place])    Obesity    PONV (postoperative nausea and vomiting)     Past Surgical History:  Procedure Laterality Date   APPENDECTOMY     APPLICATION OF CRANIAL NAVIGATION N/A 08/16/2019   Procedure: APPLICATION OF CRANIAL NAVIGATION;  Surgeon: Judith Part, MD;  Location: [facility];  Service: Neurosurgery;  Laterality: N/A;   bilateral breast reduction  2004   CHOLECYSTECTOMY     COLONOSCOPY WITH PROPOFOL N/A 05/07/2021   Procedure: COLONOSCOPY WITH PROPOFOL;  Surgeon: Harvel Quale, MD;  [facility];  Service: Gastroenterology;  Laterality: N/A;  7:30   CORONARY STENT INTERVENTION N/A 02/01/2020   Procedure: CORONARY STENT INTERVENTION;  Surgeon: Burnell Blanks, MD;  Location: [facility];  Service: Cardiovascular;  Laterality: N/A;   CRANIOTOMY Left 08/16/2019   Procedure: Left craniotomy for tumor resection;  Surgeon: Judith Part, MD;  Location: [facility];  Service: Neurosurgery;  Laterality: Left;  Left craniotomy for tumor resection   INTRAVASCULAR PRESSURE WIRE/FFR STUDY N/A 02/01/2020   Procedure: INTRAVASCULAR PRESSURE WIRE/FFR STUDY;  Surgeon: Burnell Blanks, MD;  Location: [facility];  Service: Cardiovascular;  Laterality: N/A;   LEFT HEART CATH AND CORONARY ANGIOGRAPHY N/A 02/01/2020   Procedure: LEFT HEART CATH AND CORONARY ANGIOGRAPHY;  Surgeon: Burnell Blanks, MD;  Location: [facility];  Service: Cardiovascular;  Laterality:  N/A;   POLYPECTOMY  05/07/2021   Procedure: POLYPECTOMY;  Surgeon: Montez Morita, Quillian Quince, MD;  [facility];  Service: Gastroenterology;;   REDUCTION MAMMAPLASTY Bilateral    removal of left ovarian cyst  2003   SALPINGECTOMY     TUBAL LIGATION  2011   Women's, removal of tubes 2013    Current Medications: Current Meds  Medication Sig   acetaminophen (TYLENOL) 325 MG tablet Take 325 mg by mouth every 6 (six) hours as needed.    amLODipine (NORVASC) 10 MG tablet Take 1 tablet (10 mg total) by mouth daily.   aspirin 81 MG chewable tablet Chew 1 tablet (81 mg total) by mouth daily.   atorvastatin (LIPITOR) 80 MG tablet Take 1 tablet (80 mg total) by mouth daily.   ergocalciferol (VITAMIN D2) 1.25 MG (50000 UT) capsule Take 1 capsule (50,000 Units total) by mouth once a week. One capsule once weekly   fenofibrate (TRICOR) 48 MG tablet Take 1 tablet (48 mg total) by mouth daily.   ibuprofen (ADVIL) 600 MG tablet Take 1 tablet (600 mg total) by mouth every 6 (six) hours as needed.   labetalol (NORMODYNE) 200 MG tablet Take 1 tablet (200 mg total) by mouth 2 (two) times daily.   triamterene-hydrochlorothiazide (MAXZIDE) 75-50 MG tablet Take 1 tablet by mouth daily.   valsartan (DIOVAN) 80 MG tablet Take 1 tablet (80 mg total) by mouth daily.   [DISCONTINUED] carvedilol (COREG) 3.125 MG tablet Take 1 tablet (3.125 mg total) by mouth 2 (two) times daily with a meal.   [DISCONTINUED] cloNIDine (CATAPRES) 0.3 MG tablet Take 1 tablet (0.3 mg total) by mouth at bedtime.     Allergies:   Lisinopril   Social History   Socioeconomic History   Marital status: Single    Spouse name: Not on file   Number of children: 4   Years of education: Not on file   Highest education level: Associate degree: occupational, Hotel manager, or vocational program  Occupational History   Not on file  Tobacco Use   Smoking status: Never   Smokeless tobacco: Never  Vaping Use   Vaping Use: Never used  Substance and Sexual Activity   Alcohol use: Yes    Comment: rarely   Drug use: No   Sexual activity: Yes    Birth control/protection: Surgical  Other Topics Concern   Not on file  Social History Narrative   Lives with children   Caffeine- sodas, 48 oz daily   Social Determinants of Health   Financial Resource Strain: Not on file  Food Insecurity: No Food Insecurity (11/13/2022)   Hunger Vital Sign    Worried About Running Out of Food in  the Last Year: Never true    [place] in the Last Year: Never true  Transportation Needs: No Transportation Needs (11/13/2022)   PRAPARE - Hydrologist (Medical): No    Lack of Transportation (Non-Medical): No  Physical Activity: Sufficiently Active (11/13/2022)   Exercise Vital Sign    Days of Exercise per Week: 5 days    Minutes of Exercise per Session: 150+ min  Stress: Not on file  Social Connections: Not on file    Family History: The patient's family history includes Coronary artery disease in her father and mother; Heart attack in her maternal grandfather; Hypertension in her father, mother, and sister; Kidney disease in her father; Multiple sclerosis in her sister; Stroke in her maternal grandmother. There is no  history of Anesthesia problems, Hypotension, Malignant hyperthermia, or Pseudochol deficiency.  ROS:   Please see the history of present illness.     All other systems reviewed and are negative.  EKGs/Labs/Other Studies Reviewed:    EKG:  EKG is not ordered today.   Echo 02/01/20  1. Left ventricular ejection fraction, by estimation, is 55 to 60%. The  left ventricle has normal function. The left ventricle has no regional  wall motion abnormalities. There is moderate left ventricular hypertrophy.  Left ventricular diastolic  parameters are consistent with Grade I diastolic dysfunction (impaired  relaxation).   2. Right ventricular systolic function is normal. The right ventricular  size is normal. Tricuspid regurgitation signal is inadequate for assessing  PA pressure.   3. The mitral valve is grossly normal. No evidence of mitral valve  regurgitation. No evidence of mitral stenosis.   4. The aortic valve is tricuspid. Aortic valve regurgitation is not  visualized. No aortic stenosis is present.   5. The inferior vena cava is normal in size with greater than 50%  respiratory variability, suggesting right atrial pressure of 3 mmHg.     LHC 02/02/20 Mid LAD lesion is 80% stenosed. A drug-eluting stent was successfully placed using a STENT RESOLUTE ONYX 3.5X18. Post intervention, there is a 0% residual stenosis. The left ventricular systolic function is normal. LV end diastolic pressure is normal. The left ventricular ejection fraction is 55-65% by visual estimate. There is no mitral valve regurgitation.   1. Severe stenosis mid LAD. Flow limiting by DFR 0.86.  2. Successful PTCA/DES x 1 mid LAD 3. No obstructive disease in the RCA or Circumflex 4. Normal LV systolic function   Recommendations: Continue DAPT for one year with ASA and Brilinta. Will start high intensity statin. Continue beta blocker. Echo later today.   Recent Labs: 04/07/2022: TSH 1.640 10/01/2022: ALT 28; BUN 12; Creatinine, Ser 0.74; Potassium 3.8; Sodium 144   Recent Lipid Panel    Component Value Date/Time   CHOL 123 10/01/2022 0811   TRIG 62 10/01/2022 0811   HDL 52 10/01/2022 0811   CHOLHDL 2.4 10/01/2022 0811   CHOLHDL 2.9 02/02/2020 0830   VLDL 15 02/02/2020 0830   LDLCALC 58 10/01/2022 0811   LDLCALC 91 11/05/2017 0906    Physical Exam:   VS:  BP 115/82 Comment: right arm  Pulse 60   Ht '5\' 3"'$  (1.6 m)   Wt 300 lb (136.1 kg)   BMI 53.14 kg/m  , BMI Body mass index is 53.14 kg/m. GENERAL:  Well appearing, overweight HEENT: Pupils equal round and reactive, fundi not visualized, oral mucosa unremarkable NECK:  No jugular venous distention, waveform within normal limits, carotid upstroke brisk and symmetric, no bruits, no thyromegaly LYMPHATICS:  No cervical adenopathy LUNGS:  Clear to auscultation bilaterally HEART:  RRR.  PMI not displaced or sustained,S1 and S2 within normal limits, no S3, no S4, no clicks, no rubs, no murmurs ABD:  Flat, positive bowel sounds normal in frequency in pitch, no bruits, no rebound, no guarding, no midline pulsatile mass, no hepatomegaly, no splenomegaly EXT:  2 plus pulses throughout, no edema,  no cyanosis no clubbing SKIN:  No rashes no nodules NEURO:  Cranial nerves II through XII grossly intact, motor grossly intact throughout PSYCH:  Cognitively intact, oriented to person place and time   ASSESSMENT/PLAN:    HTN - BP is at goal but concerns regarding duplicate therapy on her medication list (Carvedilol and Labetolol) as well as Clonidine  only prescribed once daily with risk for rebound hypertension. Plan for secondary hypertension workup detailed below. Sleep study to rule out OSA.  Labs including renin aldosterone, cortisol, catecholamines, metanephrines (hold Triamterene-HCTZ two days prior to labs) Renal duplex ordered to rule out stenosis.  After labs collected will make following changes,  Stop Carvedilol (duplicate therapy with Labetolol which we will continue)  Stop Clonidine (only taking QD concerning for rebound hypertension) Continue Triamterene-HCTZ 75-'50mg'$  QD, Amlodipine '10mg'$  QD, Labetolol '200mg'$  BID Start Valsartan '80mg'$  QD  Snores - Stop Bang 5. Provided Itamar home sleep study in clinic.  CAD - Stable with no anginal symptoms. No indication for ischemic evaluation.  GDMT aspirin, atorvastatin. Heart healthy diet and regular cardiovascular exercise encouraged.    HLD, LDL goal <70 - Continue atorvastatin '80mg'$  QD, fenofibrate. 10/01/22 LDL 58, triglycerides 62. Given how low her triglycerides are may be reasonable to trial off fenofibrate at future visit to simplify medication regimen.   Screening for Secondary Hypertension:     11/14/2022    7:36 PM  Causes  Renovascular HTN Screened  Sleep Apnea Screened  Thyroid Disease Screened  Hyperaldosteronism Screened  Pheochromocytoma Screened  Cushing's Syndrome Screened  Coarctation of the Aorta Screened    Relevant Labs/Studies:    Latest Ref Rng & Units 10/01/2022    8:11 AM 04/07/2022   11:18 AM 10/30/2021    7:29 PM  Basic Labs  Sodium 134 - 144 mmol/L 144  139    Potassium 3.5 - 5.2 mmol/L 3.8  3.7     Creatinine 0.57 - 1.00 mg/dL 0.74  0.71  0.50        Latest Ref Rng & Units 04/07/2022   11:18 AM 10/18/2020    9:48 AM  Thyroid   TSH 0.450 - 4.500 uIU/mL 1.640  1.400        Latest Ref Rng & Units 01/03/2014    8:54 AM  Renin/Aldosterone   Aldosterone ng/dL 9              11/13/2022    9:46 AM  Renovascular   Renal Artery [place] Completed Yes      she  interested in enrolling in the PREP exercise and nutrition program through the [facility].     Disposition:    FU with MD/PharmD in 2 months    Medication Adjustments/Labs and Tests Ordered: Current medicines are reviewed at length with the patient today.  Concerns regarding medicines are outlined above.  Orders Placed This Encounter  Procedures   Catecholamines, fractionated, plasma   Metanephrines, plasma   Cortisol   Aldosterone + renin activity w/ ratio   Amb Referral To Provider Referral Exercise Program (P.R.E.P)   [place] Sleep Study   VAS US RENAL ARTERY DUPLEX   Meds ordered this encounter  Medications   valsartan (DIOVAN) 80 MG tablet    Sig: Take 1 tablet (80 mg total) by mouth daily.    Dispense:  30 tablet    Refill:  6     Signed, Loel Dubonnet, NP  11/14/2022 7:36 PM    [facility]

## 2022-11-13 NOTE — Patient Instructions (Addendum)
Medication Instructions:  Your physician has recommended you make the following change in your medication: KEEP ALL MEDICATIONS THE SAME UNTIL AFTER YOU LAB WORK THEN MAKE THE CHANGES!   STOP Carvedilol  STOP Clonidine __________________________________  CONTINUE Labetolol '200mg'$  twice daily  CONTINUE Triamterene-HCTZ 75-'50mg'$  daily Hold for 2 days prior to lab work  CONTINUE Amlodipine '10mg'$  daily _______________________________________  START Valsartan '80mg'$  daily   Labwork: Your physician recommends that you return for lab work one morning this week: renin aldosterone, cortisol, catecholamines, metanephrines   Please return for Lab work. You may come to the...   [facility] (3rd floor) [address], [place], [place] [address]  Open: 8am-Noon and 1pm-4:30pm  Please ring the doorbell on the small table when you exit the elevator and the Lab Tech will come get you  [place] [facility] [address] [place], [place], [place] [address] Open: 8am-1pm, then 2pm-4:30pm   [place]- Please see attached locations sheet stapled to your lab work with address and hours.    Testing/Procedures: Your physician has requested that you have a renal artery duplex. During this test, an ultrasound is used to evaluate blood flow to the kidneys. Allow one hour for this exam. Do not eat after midnight the day before and avoid carbonated beverages. Take your medications as you usually do.    Follow-Up: APRIL 11TH AT 10:30AM WITH Laurann Montana, NP AT [facility]     Special Instructions:   You will receive a phone call from the PREP exercise and nutrition program to schedule an initial assessment.  WatchPAT?  Is a FDA cleared portable home sleep study test that uses a watch and 3 points of contact to monitor 7 different channels, including your heart rate, oxygen saturations, body position, snoring, and chest motion.  The study is easy to use from the  comfort of your own home and accurately detect sleep apnea.  Before bed, you attach the chest sensor, attached the sleep apnea bracelet to your nondominant hand, and attach the finger probe.  After the study, the raw data is downloaded from the watch and scored for apnea events.   For more information: [email or web address]  Patient Testing Instructions:  Do not put battery into the device until bedtime when you are ready to begin the test. Please call the support number if you need assistance after following the instructions below: 24 hour support line- [phone] or ITAMAR support at [phone] (option 2)  Download the The First AmericanWatchPAT One" app through the google play store or App Store  Be sure to turn on or enable access to bluetooth in settlings on your smartphone/ device  Make sure no other bluetooth devices are on and within the vicinity of your smartphone/ device and WatchPAT watch during testing.  Make sure to leave your smart phone/ device plugged in and charging all night.  When ready for bed:  Follow the instructions step by step in the WatchPAT One App to activate the testing device. For additional instructions, including video instruction, visit the WatchPAT One video on Youtube. You can search for [place] One within Youtube (video is 4 minutes and 18 seconds) or enter: https://youtube/watch?v=BCce_vbiwxE Please note: You will be prompted to enter a Pin to connect via bluetooth when starting the test. The PIN will be assigned to you when you receive the test.  The device is disposable, but it recommended that you retain the device until you receive a call letting you know the study has been  received and the results have been interpreted.  We will let you know if the study did not transmit to [place] properly after the test is completed. You do not need to call us to confirm the receipt of the test.  Please complete the test within 48 hours of receiving PIN.    Frequently Asked Questions:  What is Watch [place] one?  A single use fully disposable home sleep apnea testing device and will not need to be returned after completion.  What are the requirements to use WatchPAT one?  The be able to have a successful watchpat one sleep study, you should have your Watch pat one device, your smart phone, watch pat one app, your PIN number and Internet access What type of phone do I need?  You should have a smart phone that uses Android 5.1 and above or any Iphone with IOS 10 and above How can I download the WatchPAT one app?  Based on your device type search for WatchPAT one app either in google play for android devices or APP store for Iphone's Where will I get my PIN for the study?  Your PIN will be provided by your physician's office. It is used for authentication and if you lose/forget your PIN, please reach out to your providers office.  I do not have Internet at home. Can I do WatchPAT one study?  WatchPAT One needs Internet connection throughout the night to be able to transmit the sleep data. You can use your home/local internet or your cellular's data package. However, it is always recommended to use home/local Internet. It is estimated that between 20MB-30MB will be used with each study.However, the application will be looking for 80MB space in the phone to start the study.  What happens if I lose internet or bluetooth connection?  During the internet disconnection, your phone will not be able to transmit the sleep data. All the data, will be stored in your phone. As soon as the internet connection is back on, the phone will being sending the sleep data. During the bluetooth disconnection, WatchPAT one will not be able to to send the sleep data to your phone. Data will be kept in the [facility] one until two devices have bluetooth connection back on. As soon as the connection is back on, WatchPAT one will send the sleep data to the phone.  How long do I need  to wear the WatchPAT one?  After you start the study, you should wear the device at least 6 hours.  How far should I keep my phone from the device?  During the night, your phone should be within 15 feet.  What happens if I leave the room for restroom or other reasons?  Leaving the room for any reason will not cause any problem. As soon as your get back to the room, both devices will reconnect and will continue to send the sleep data. Can I use my phone during the sleep study?  Yes, you can use your phone as usual during the study. But it is recommended to put your watchpat one on when you are ready to go to bed.  How will I get my study results?  A soon as you completed your study, your sleep data will be sent to the provider. They will then share the results with you when they are ready.    DASH Eating Plan DASH stands for Dietary Approaches to Stop Hypertension. The DASH eating plan is a healthy eating plan that  has been shown to: Reduce high blood pressure (hypertension). Reduce your risk for type 2 diabetes, heart disease, and stroke. Help with weight loss. What are tips for following this plan? Reading food labels Check food labels for the amount of salt (sodium) per serving. Choose foods with less than 5 percent of the Daily Value of sodium. Generally, foods with less than 300 milligrams (mg) of sodium per serving fit into this eating plan. To find whole grains, look for the word "whole" as the first word in the ingredient list. Shopping Buy products labeled as "low-sodium" or "no salt added." Buy fresh foods. Avoid canned foods and pre-made or frozen meals. Cooking Avoid adding salt when cooking. Use salt-free seasonings or herbs instead of table salt or sea salt. Check with your health care provider or pharmacist before using salt substitutes. Do not fry foods. Cook foods using healthy methods such as baking, boiling, grilling, roasting, and broiling instead. Cook with  heart-healthy oils, such as olive, canola, avocado, soybean, or sunflower oil. Meal planning  Eat a balanced diet that includes: 4 or more servings of fruits and 4 or more servings of vegetables each day. Try to fill one-half of your plate with fruits and vegetables. 6-8 servings of whole grains each day. Less than 6 oz (170 g) of lean meat, poultry, or fish each day. A 3-oz (85-g) serving of meat is about the same size as a deck of cards. One egg equals 1 oz (28 g). 2-3 servings of low-fat dairy each day. One serving is 1 cup (237 mL). 1 serving of nuts, seeds, or beans 5 times each week. 2-3 servings of heart-healthy fats. Healthy fats called omega-3 fatty acids are found in foods such as walnuts, flaxseeds, fortified milks, and eggs. These fats are also found in cold-water fish, such as sardines, salmon, and mackerel. Limit how much you eat of: Canned or prepackaged foods. Food that is high in trans fat, such as some fried foods. Food that is high in saturated fat, such as fatty meat. Desserts and other sweets, sugary drinks, and other foods with added sugar. Full-fat dairy products. Do not salt foods before eating. Do not eat more than 4 egg yolks a week. Try to eat at least 2 vegetarian meals a week. Eat more home-cooked food and less restaurant, buffet, and fast food. Lifestyle When eating at a restaurant, ask that your food be prepared with less salt or no salt, if possible. If you drink alcohol: Limit how much you use to: 0-1 drink a day for women who are not pregnant. 0-2 drinks a day for men. Be aware of how much alcohol is in your drink. In the [place] one drink equals one 12 oz bottle of beer (355 mL), one 5 oz glass of wine (148 mL), or one 1 oz glass of hard liquor (44 mL). General information Avoid eating more than 2,300 mg of salt a day. If you have hypertension, you may need to reduce your sodium intake to 1,500 mg a day. Work with your health care provider to maintain a  healthy body weight or to lose weight. Ask what an ideal weight is for you. Get at least 30 minutes of exercise that causes your heart to beat faster (aerobic exercise) most days of the week. Activities may include walking, swimming, or biking. Work with your health care provider or dietitian to adjust your eating plan to your individual calorie needs. What foods should I eat? Fruits All fresh, dried, or frozen  fruit. Canned fruit in natural juice (without added sugar). Vegetables Fresh or frozen vegetables (raw, steamed, roasted, or grilled). Low-sodium or reduced-sodium tomato and vegetable juice. Low-sodium or reduced-sodium tomato sauce and tomato paste. Low-sodium or reduced-sodium canned vegetables. Grains Whole-grain or whole-wheat bread. Whole-grain or whole-wheat pasta. Brown rice. [place]. Bulgur. Whole-grain and low-sodium cereals. Pita bread. Low-fat, low-sodium crackers. Whole-wheat flour tortillas. Meats and other proteins Skinless chicken or [place]. Ground chicken or [place]. Pork with fat trimmed off. Fish and seafood. Egg whites. Dried beans, peas, or lentils. Unsalted nuts, nut butters, and seeds. Unsalted canned beans. Lean cuts of beef with fat trimmed off. Low-sodium, lean precooked or cured meat, such as sausages or meat loaves. Dairy Low-fat (1%) or fat-free (skim) milk. Reduced-fat, low-fat, or fat-free cheeses. Nonfat, low-sodium ricotta or cottage cheese. Low-fat or nonfat yogurt. Low-fat, low-sodium cheese. Fats and oils Soft margarine without trans fats. Vegetable oil. Reduced-fat, low-fat, or light mayonnaise and salad dressings (reduced-sodium). Canola, safflower, olive, avocado, soybean, and sunflower oils. Avocado. Seasonings and condiments Herbs. Spices. Seasoning mixes without salt. Other foods Unsalted popcorn and pretzels. Fat-free sweets. The items listed above may not be a complete list of foods and beverages you can eat. Contact a dietitian for more  information. What foods should I avoid? Fruits Canned fruit in a light or heavy syrup. Fried fruit. Fruit in cream or butter sauce. Vegetables Creamed or fried vegetables. Vegetables in a cheese sauce. Regular canned vegetables (not low-sodium or reduced-sodium). Regular canned tomato sauce and paste (not low-sodium or reduced-sodium). Regular tomato and vegetable juice (not low-sodium or reduced-sodium). Angie Fava. Olives. Grains Baked goods made with fat, such as croissants, muffins, or some breads. Dry pasta or rice meal packs. Meats and other proteins Fatty cuts of meat. Ribs. Fried meat. Berniece Salines. Bologna, salami, and other precooked or cured meats, such as sausages or meat loaves. Fat from the back of a pig (fatback). Bratwurst. Salted nuts and seeds. Canned beans with added salt. Canned or smoked fish. Whole eggs or egg yolks. Chicken or [place] with skin. Dairy Whole or 2% milk, cream, and half-and-half. Whole or full-fat cream cheese. Whole-fat or sweetened yogurt. Full-fat cheese. Nondairy creamers. Whipped toppings. Processed cheese and cheese spreads. Fats and oils Butter. Stick margarine. Lard. Shortening. Ghee. Bacon fat. Tropical oils, such as coconut, palm kernel, or palm oil. Seasonings and condiments Onion salt, garlic salt, seasoned salt, table salt, and sea salt. Worcestershire sauce. Tartar sauce. Barbecue sauce. Teriyaki sauce. Soy sauce, including reduced-sodium. Steak sauce. Canned and packaged gravies. Fish sauce. Oyster sauce. Cocktail sauce. Store-bought horseradish. Ketchup. Mustard. Meat flavorings and tenderizers. Bouillon cubes. Hot sauces. Pre-made or packaged marinades. Pre-made or packaged taco seasonings. Relishes. Regular salad dressings. Other foods Salted popcorn and pretzels. The items listed above may not be a complete list of foods and beverages you should avoid. Contact a dietitian for more information. Where to find more information National Heart, Lung, and  Blood Institute: https://Ony[place]chi [place]ante-eaton.com/ American Heart Association: www.heart.org Academy of Nutrition and Dietetics: www.eatright.[place]: www.kidney.org Summary The DASH eating plan is a healthy eating plan that has been shown to reduce high blood pressure (hypertension). It may also reduce your risk for type 2 diabetes, heart disease, and stroke. When on the DASH eating plan, aim to eat more fresh fruits and vegetables, whole grains, lean proteins, low-fat dairy, and heart-healthy fats. With the DASH eating plan, you should limit salt (sodium) intake to 2,300 mg a day. If you have hypertension, you may need  to reduce your sodium intake to 1,500 mg a day. Work with your health care provider or dietitian to adjust your eating plan to your individual calorie needs. This information is not intended to replace advice given to you by your health care provider. Make sure you discuss any questions you have with your health care provider. Document Revised: 09/02/2019 Document Reviewed: 09/02/2019 Elsevier Patient Education  [place].

## 2022-11-14 ENCOUNTER — Encounter (HOSPITAL_BASED_OUTPATIENT_CLINIC_OR_DEPARTMENT_OTHER): Payer: Self-pay

## 2022-11-14 ENCOUNTER — Telehealth: Payer: Self-pay | Admitting: *Deleted

## 2022-11-14 NOTE — Telephone Encounter (Signed)
Contacted regarding PREP Class referral. She is interested in participating at the [facility]. Class to begin 12/16/2022 1130-1245. I will call to schedule her intake assessment prior to the start of class.

## 2022-11-14 NOTE — Telephone Encounter (Signed)
Prior Authorization for [employer] sent to [employer].  No PA is required.Call Reference # [id number]. Elonda Husky notified ok to activate device.

## 2022-11-19 DIAGNOSIS — I10 Essential (primary) hypertension: Secondary | ICD-10-CM | POA: Diagnosis not present

## 2022-11-21 ENCOUNTER — Encounter (INDEPENDENT_AMBULATORY_CARE_PROVIDER_SITE_OTHER): Payer: BC Managed Care – PPO | Admitting: Cardiology

## 2022-11-21 DIAGNOSIS — G4733 Obstructive sleep apnea (adult) (pediatric): Secondary | ICD-10-CM

## 2022-11-22 ENCOUNTER — Ambulatory Visit: Payer: BC Managed Care – PPO | Attending: Family

## 2022-11-22 DIAGNOSIS — R0683 Snoring: Secondary | ICD-10-CM

## 2022-11-22 NOTE — Procedures (Signed)
SLEEP STUDY REPORT Patient Information Study Date: 11/21/2022 Patient Name: [name] Patient ID: [id number] Birth Date: 13-Oct-1971 Age: 52 Gender: Female BMI: 53.1 (W=300 lb, H=5' 3'') Referring Physician: Laurann Montana, NP  TEST DESCRIPTION: Home sleep apnea testing was completed using the WatchPat, a Type 1 device, utilizing peripheral arterial tonometry (PAT), chest movement, actigraphy, pulse oximetry, pulse rate, body position and snore.  AHI was calculated with apnea and hypopnea using valid sleep time as the denominator. RDI includes apneas, hypopneas, and RERAs.  The data acquired and the scoring of sleep and all associated events were performed in accordance with the recommended standards and specifications as outlined in the AASM Manual for the Scoring of Sleep and Associated Events 2.2.0 (2015).  FINDINGS:  1.  Severe Obstructive Sleep Apnea with AHI 30.2/hr.   2.  No Central Sleep Apnea with pAHIc 1/hr.  3.  Oxygen desaturations as low as 55%.  4.  Mild to moderate snoring was present. O2 sats were < 88% for 104.8 min.  5.  Total sleep time was 7 hrs and 7 min.  6.  28.2% of total sleep time was spent in REM sleep.   7.  Normal sleep onset latency at 19 min.   8.  Shortened REM sleep onset latency at 25 min.   9.  Total awakenings were 27.  10. Arrhythmia detection:  None  DIAGNOSIS:   Severe Obstructive Sleep Apnea (G47.33) Nocturnal Hypoxemia  RECOMMENDATIONS:   1.  Clinical correlation of these findings is necessary.  The decision to treat obstructive sleep apnea (OSA) is usually based on the presence of apnea symptoms or the presence of associated medical conditions such as Hypertension, Congestive Heart Failure, Atrial Fibrillation or Obesity.  The most common symptoms of OSA are snoring, gasping for breath while sleeping, daytime sleepiness and fatigue.   2.  Initiating apnea therapy is recommended given the presence of symptoms and/or associated conditions.  Recommend proceeding with one of the following:     a.  Auto-CPAP therapy with a pressure range of 5-20cm H2O.     b.  An oral appliance (OA) that can be obtained from certain dentists with expertise in sleep medicine.  These are primarily of use in non-obese patients with mild and moderate disease.     c.  An ENT consultation which may be useful to look for specific causes of obstruction and possible treatment options.     d.  If patient is intolerant to PAP therapy, consider referral to ENT for evaluation for hypoglossal nerve stimulator.   3.  Close follow-up is necessary to ensure success with CPAP or oral appliance therapy for maximum benefit.  4.  A follow-up oximetry study on CPAP is recommended to assess the adequacy of therapy and determine the need for supplemental oxygen or the potential need for Bi-level therapy.  An arterial blood gas to determine the adequacy of baseline ventilation and oxygenation should also be considered.  5.  Healthy sleep recommendations include:  adequate nightly sleep (normal 7-9 hrs/night), avoidance of caffeine after noon and alcohol near bedtime, and maintaining a sleep environment that is cool, dark and quiet.  6.  Weight loss for overweight patients is recommended.  Even modest amounts of weight loss can significantly improve the severity of sleep apnea.  7.  Snoring recommendations include:  weight loss where appropriate, side sleeping, and avoidance of alcohol before bed.  8.  Operation of motor vehicle should be avoided when sleepy.  Signature: Fransico Him,  MD; Leonidas Romberg; Diplomat, American Board of Sleep Medicine Electronically Signed: 11/22/2022

## 2022-11-27 ENCOUNTER — Telehealth: Payer: Self-pay | Admitting: *Deleted

## 2022-11-27 ENCOUNTER — Other Ambulatory Visit: Payer: Self-pay | Admitting: Cardiology

## 2022-11-27 DIAGNOSIS — G4733 Obstructive sleep apnea (adult) (pediatric): Secondary | ICD-10-CM

## 2022-11-27 NOTE — Telephone Encounter (Signed)
Patient notified of itamar results and recommendations. She agrees to proceed with CPAP titration pending insurance approval.

## 2022-12-01 ENCOUNTER — Telehealth: Payer: Self-pay | Admitting: *Deleted

## 2022-12-01 ENCOUNTER — Ambulatory Visit: Payer: BC Managed Care – PPO | Attending: Family

## 2022-12-01 DIAGNOSIS — I10 Essential (primary) hypertension: Secondary | ICD-10-CM | POA: Diagnosis not present

## 2022-12-01 NOTE — Telephone Encounter (Signed)
Contacted regarding PREP Class beginning 12/16/2022. Patient stated she had several obligations in March and she did not want to miss so many classes. She will participate in the April class beginning on the 2nd.

## 2022-12-02 LAB — CORTISOL: Cortisol: 4.9 ug/dL — ABNORMAL LOW (ref 6.2–19.4)

## 2022-12-02 LAB — CATECHOLAMINES, FRACTIONATED, PLASMA
Dopamine: <30 pg/mL (ref 0–48)
Epinephrine: <15 pg/mL (ref 0–62)
Norepinephrine: 335 pg/mL (ref 0–874)

## 2022-12-02 LAB — ALDOSTERONE + RENIN ACTIVITY W/ RATIO
Aldos/Renin Ratio: 26.2 (ref 0.0–30.0)
Aldosterone: 14 ng/dL (ref 0.0–30.0)
Renin Activity, Plasma: 0.535 ng/mL/hr (ref 0.167–5.380)

## 2022-12-02 LAB — METANEPHRINES, PLASMA
Metanephrine, Free: <25 pg/mL (ref 0.0–88.0)
Normetanephrine, Free: 71.6 pg/mL (ref 0.0–218.9)

## 2022-12-08 ENCOUNTER — Telehealth: Payer: Self-pay | Admitting: *Deleted

## 2022-12-08 NOTE — Telephone Encounter (Signed)
Prior Authorization for CPAP titration sent to [facility] via Phone. Per representative no PA is required. Must meet medical necessity. Call Reference # [id number].

## 2022-12-09 ENCOUNTER — Telehealth: Payer: Self-pay | Admitting: *Deleted

## 2022-12-09 NOTE — Telephone Encounter (Signed)
Contacted regarding PREP Class to begin 01/13/2023. Informed patient I will not be able to offer this class start date. The next available class will be early July 2024. There are [facility] location options with earlier availability but those do not work for her. I will call her back when scheduling for July.

## 2023-01-06 ENCOUNTER — Other Ambulatory Visit: Payer: Self-pay | Admitting: *Deleted

## 2023-01-06 MED ORDER — VALSARTAN 80 MG PO TABS: 80.0000 mg | ORAL_TABLET | Freq: Every day | ORAL | 1 refills | Status: DC

## 2023-01-07 ENCOUNTER — Other Ambulatory Visit: Payer: Self-pay

## 2023-01-07 DIAGNOSIS — I10 Essential (primary) hypertension: Secondary | ICD-10-CM

## 2023-01-07 MED ORDER — FENOFIBRATE 48 MG PO TABS: 48.0000 mg | ORAL_TABLET | Freq: Every day | ORAL | 2 refills | Status: DC

## 2023-01-07 MED ORDER — ATORVASTATIN CALCIUM 80 MG PO TABS: 80.0000 mg | ORAL_TABLET | Freq: Every day | ORAL | 3 refills | Status: DC

## 2023-01-07 MED ORDER — AMLODIPINE BESYLATE 10 MG PO TABS: 10.0000 mg | ORAL_TABLET | Freq: Every day | ORAL | 3 refills | Status: DC

## 2023-01-07 MED ORDER — LABETALOL HCL 200 MG PO TABS: 200.0000 mg | ORAL_TABLET | Freq: Two times a day (BID) | ORAL | 3 refills | Status: DC

## 2023-01-14 ENCOUNTER — Ambulatory Visit: Payer: BC Managed Care – PPO | Attending: Cardiology | Admitting: Cardiology

## 2023-01-14 DIAGNOSIS — G4733 Obstructive sleep apnea (adult) (pediatric): Secondary | ICD-10-CM | POA: Diagnosis not present

## 2023-01-15 NOTE — Procedures (Signed)
   Patient Name: Meadow, [place] Date: 01/14/2023 Gender: Female D.O.B: Aug 20, 1971 Age (years): 79 Referring Provider: Fransico Him MD, ABSM Height (inches): 63 Interpreting Physician: Fransico Him MD, ABSM Weight (lbs): 284 RPSGT: Rosebud Poles BMI: 50 MRN: [id number] Neck Size: 17.50  CLINICAL INFORMATION The patient is referred for a CPAP titration to treat sleep apnea.  SLEEP STUDY TECHNIQUE As per the AASM Manual for the Scoring of Sleep and Associated Events v2.3 (April 2016) with a hypopnea requiring 4% desaturations.  The channels recorded and monitored were frontal, central and occipital EEG, electrooculogram (EOG), submentalis EMG (chin), nasal and oral airflow, thoracic and abdominal wall motion, anterior tibialis EMG, snore microphone, electrocardiogram, and pulse oximetry. Continuous positive airway pressure (CPAP) was initiated at the beginning of the study and titrated to treat sleep-disordered breathing.  MEDICATIONS Medications self-administered by patient taken the night of the study : N/A  TECHNICIAN COMMENTS Comments added by technician: Patient tolerated CPAP therapy very well. CPAP therapy started at 4 CWP and increased to 10 CWP with good control of events. Titration later increased to 11 CWP, only due to O2 desats w/o being associated with events. PLMS increased during latter part of study. ECG = NSR Comments added by scorer: N/A  RESPIRATORY PARAMETERS Optimal PAP Pressure (cm):10  AHI at Optimal Pressure (/hr)0 Overall Minimal O2 (%): 79.00  Supine % at Optimal Pressure (%):100 Minimal O2 at Optimal Pressure (%): 88.0   SLEEP ARCHITECTURE The study was initiated at 10:15:05 PM and ended at 4:54:26 AM.  Sleep onset time was 0.0 minutes and the sleep efficiency was 96.4%. The total sleep time was 384.9 minutes.  The patient spent 1.12% of the night in stage N1 sleep, 37.82% in stage N2 sleep, 30.01% in stage N3 and 31.1% in REM.Stage REM latency was  59.3 minutes  Wake after sleep onset was 14.5. Alpha intrusion was absent. Supine sleep was 99.94%.  CARDIAC DATA The 2 lead EKG demonstrated sinus rhythm. The mean heart rate was 76.93 beats per minute. Other EKG findings include: None.  LEG MOVEMENT DATA The total Periodic Limb Movements of Sleep (PLMS) were 198. The PLMS index was 30.87. A PLMS index of <15 is considered normal in adults.  IMPRESSIONS - The optimal PAP pressure was 11 cm of water. - Severe oxygen desaturations were observed during this titration (min O2 = 79.00%). - The patient snored with moderate snoring volume during this titration study. - No cardiac abnormalities were observed during this study. - Moderate periodic limb movements were observed during this study. Arousals associated with PLMs were rare.  DIAGNOSIS - Obstructive Sleep Apnea (G47.33)  RECOMMENDATIONS - Trial of ResMed CPAP therapy on 10 cm H2O with a Large size Fisher&Paykel Nasal Eson2 mask and heated humidification. - Avoid alcohol, sedatives and other CNS depressants that may worsen sleep apnea and disrupt normal sleep architecture. - Sleep hygiene should be reviewed to assess factors that may improve sleep quality. - Weight management and regular exercise should be initiated or continued. - Return to Sleep Center for re-evaluation after 4 weeks of therapy  [Electronically signed] 01/15/2023 02:24 PM  Fransico Him MD, ABSM Diplomate, American Board of Sleep Medicine

## 2023-01-21 ENCOUNTER — Telehealth: Payer: Self-pay | Admitting: *Deleted

## 2023-01-21 NOTE — Telephone Encounter (Signed)
-----   Message from Gaynelle Cage, CMA sent at 01/16/2023  8:32 AM EDT -----  ----- Message ----- From: Quintella Reichert, MD Sent: 01/15/2023   2:26 PM EDT To: [facility] Sleep Studies  Please let patient know that they had a successful PAP titration and let DME know that orders are in EPIC.  Please set up 6 week OV with me.

## 2023-01-21 NOTE — Telephone Encounter (Signed)
The patient has been notified of the result and verbalized understanding.  All questions (if any) were answered. [name], CMA 01/21/2023 6:23 PM    Upon patient request DME selection is [facility]. Patient understands he will be contacted by [facility] to set up his cpap. Patient understands to call if [facility] does not contact him with new setup in a timely manner. Patient understands they will be called once confirmation has been received from [facility] that they have received their new machine to schedule 10 week follow up appointment.   [facility] notified of new cpap order  Please add to airview Patient was grateful for the call and thanked me

## 2023-01-22 ENCOUNTER — Ambulatory Visit (HOSPITAL_BASED_OUTPATIENT_CLINIC_OR_DEPARTMENT_OTHER): Payer: BC Managed Care – PPO | Admitting: Family

## 2023-01-22 ENCOUNTER — Encounter (HOSPITAL_BASED_OUTPATIENT_CLINIC_OR_DEPARTMENT_OTHER): Payer: Self-pay | Admitting: Family

## 2023-01-22 VITALS — BP 122/87 | HR 93 | Ht 63.0 in | Wt 307.4 lb

## 2023-01-22 DIAGNOSIS — I25118 Atherosclerotic heart disease of native coronary artery with other forms of angina pectoris: Secondary | ICD-10-CM

## 2023-01-22 DIAGNOSIS — G4733 Obstructive sleep apnea (adult) (pediatric): Secondary | ICD-10-CM | POA: Diagnosis not present

## 2023-01-22 DIAGNOSIS — I10 Essential (primary) hypertension: Secondary | ICD-10-CM | POA: Diagnosis not present

## 2023-01-22 DIAGNOSIS — E785 Hyperlipidemia, unspecified: Secondary | ICD-10-CM | POA: Diagnosis not present

## 2023-01-22 MED ORDER — AMLODIPINE BESYLATE 5 MG PO TABS: 5.0000 mg | ORAL_TABLET | Freq: Every day | ORAL | 3 refills | Status: DC

## 2023-01-22 NOTE — Progress Notes (Signed)
Advanced Hypertension Clinic Initial Assessment:    Date:  01/22/2023   ID:  [name], DOB 09/27/1971, MRN [id number]  PCP:  Kerri Perches, MD  Cardiologist:  Verne Carrow, MD  Nephrologist:  Referring MD: Kerri Perches, MD   CC: Hypertension  History of Present Illness:    [name] is a 52 y.o. female with a hx of CAD s/p anterior STEMI 01/2020 with DES-LAD, HTN, obesity, HLD. She is here to follow-up in the Advanced Hypertension Clinic. Referred by her primary care provider. She follows with Dr. Clifton James of cardiology.  Established with advanced hypertension clinic 11/13/2022.  At that time clonidine was discontinued as she was taking daily and there was concern for rebound hypertension.  Carvedilol was discontinued as she was also on labetalol.  Valsartan 80 mg daily was initiated.  Renal duplex as well as sleep study ordered.  Renal duplex 12/01/22 with no stenosis. Sleep study revealed OSA and she was started on CPAP with orders sent to DME 01/21/23.   Presents today for follow-up independently.  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  Initially diagnosed with hypertension in 200 pregnant with her daughter (this was her second pregnancy). Blood pressure checked with wrist cuff at home which has been checked for accuracy.  No tobacco use and only social EtOH use.  For exercise she she works out on her treadmill as well as foot press. She works out twice per week. she eats at home and outside of the home and does follow low sodium diet.  Since last visit has decreased her intake of soda.  Since last seen went on a trip to [place] and to [place] which she enjoyed.  Reports BP at home has been well-controlled routinely less than 130/80.   Previous antihypertensives: Lisinopril - cough Methyldopa (aldomet) - during pregnancy Carvedilol -stopped due to duplicate therapy with labetalol. Clonidine -discontinue due to daily dosing and  concern for rebound hypertension (tolerated patch previously)   Past Medical History:  Diagnosis Date   CAD (coronary artery disease), native coronary artery    s/p DES to LAD 02/01/20   Chest pain 01/2020   Facial numbness    Fibroid    Fx ankle    Hypertension    Myocardial infarction    Obesity    PONV (postoperative nausea and vomiting)     Past Surgical History:  Procedure Laterality Date   APPENDECTOMY     APPLICATION OF CRANIAL NAVIGATION N/A 08/16/2019   Procedure: APPLICATION OF CRANIAL NAVIGATION;  Surgeon: Jadene Pierini, MD;  Location: [facility] OR;  Service: Neurosurgery;  Laterality: N/A;   bilateral breast reduction  2004   CHOLECYSTECTOMY     COLONOSCOPY WITH PROPOFOL N/A 05/07/2021   Procedure: COLONOSCOPY WITH PROPOFOL;  Surgeon: Dolores Frame, MD;  Location: [facility] ENDO SUITE;  Service: Gastroenterology;  Laterality: N/A;  7:30   CORONARY PRESSURE/FFR STUDY N/A 02/01/2020   Procedure: INTRAVASCULAR PRESSURE WIRE/FFR STUDY;  Surgeon: Kathleene Hazel, MD;  Location: [facility] INVASIVE CV LAB;  Service: Cardiovascular;  Laterality: N/A;   CORONARY STENT INTERVENTION N/A 02/01/2020   Procedure: CORONARY STENT INTERVENTION;  Surgeon: Kathleene Hazel, MD;  Location: [facility] INVASIVE CV LAB;  Service: Cardiovascular;  Laterality: N/A;   CRANIOTOMY Left 08/16/2019   Procedure: Left craniotomy for tumor resection;  Surgeon: Jadene Pierini, MD;  Location: [facility] OR;  Service: Neurosurgery;  Laterality: Left;  Left craniotomy for tumor resection  LEFT HEART CATH AND CORONARY ANGIOGRAPHY N/A 02/01/2020   Procedure: LEFT HEART CATH AND CORONARY ANGIOGRAPHY;  Surgeon: Kathleene HazelMcAlhany, Christopher D, MD;  Location: [facility] INVASIVE CV LAB;  Service: Cardiovascular;  Laterality: N/A;   POLYPECTOMY  05/07/2021   Procedure: POLYPECTOMY;  Surgeon: Marguerita Merlesastaneda Mayorga, Reuel Boomaniel, MD;  Location: [facility];  Service: Gastroenterology;;   REDUCTION MAMMAPLASTY Bilateral    removal of  left ovarian cyst  2003   SALPINGECTOMY     TUBAL LIGATION  2011   Women's, removal of tubes 2013    Current Medications: Current Meds  Medication Sig   acetaminophen (TYLENOL) 325 MG tablet Take 325 mg by mouth every 6 (six) hours as needed.   amLODipine (NORVASC) 5 MG tablet Take 1 tablet (5 mg total) by mouth daily.   aspirin 81 MG chewable tablet Chew 1 tablet (81 mg total) by mouth daily.   atorvastatin (LIPITOR) 80 MG tablet Take 1 tablet (80 mg total) by mouth daily.   ergocalciferol (VITAMIN D2) 1.25 MG (50000 UT) capsule Take 1 capsule (50,000 Units total) by mouth once a week. One capsule once weekly   ibuprofen (ADVIL) 600 MG tablet Take 1 tablet (600 mg total) by mouth every 6 (six) hours as needed.   labetalol (NORMODYNE) 200 MG tablet Take 1 tablet (200 mg total) by mouth 2 (two) times daily.   triamterene-hydrochlorothiazide (MAXZIDE) 75-50 MG tablet Take 1 tablet by mouth daily.   valsartan (DIOVAN) 80 MG tablet Take 1 tablet (80 mg total) by mouth daily.   [DISCONTINUED] amLODipine (NORVASC) 10 MG tablet Take 1 tablet (10 mg total) by mouth daily.   [DISCONTINUED] fenofibrate (TRICOR) 48 MG tablet Take 1 tablet (48 mg total) by mouth daily.     Allergies:   Lisinopril   Social History   Socioeconomic History   Marital status: Single    Spouse name: Not on file   Number of children: 4   Years of education: Not on file   Highest education level: Associate degree: occupational, Scientist, product/process developmenttechnical, or vocational program  Occupational History   Not on file  Tobacco Use   Smoking status: Never   Smokeless tobacco: Never  Vaping Use   Vaping Use: Never used  Substance and Sexual Activity   Alcohol use: Yes    Comment: rarely   Drug use: No   Sexual activity: Yes    Birth control/protection: Surgical  Other Topics Concern   Not on file  Social History Narrative   Lives with children   Caffeine- sodas, 48 oz daily   Social Determinants of Health   Financial  Resource Strain: Not on file  Food Insecurity: No Food Insecurity (11/13/2022)   Hunger Vital Sign    Worried About Running Out of Food in the Last Year: Never true    Ran Out of Food in the Last Year: Never true  Transportation Needs: No Transportation Needs (11/13/2022)   PRAPARE - Administrator, Civil ServiceTransportation    Lack of Transportation (Medical): No    Lack of Transportation (Non-Medical): No  Physical Activity: Sufficiently Active (11/13/2022)   Exercise Vital Sign    Days of Exercise per Week: 5 days    Minutes of Exercise per Session: 150+ min  Stress: Not on file  Social Connections: Not on file    Family History: The patient's family history includes Coronary artery disease in her father and mother; Heart attack in her maternal grandfather; Hypertension in her father, mother, and sister; Kidney disease in her father; Multiple sclerosis  in her sister; Stroke in her maternal grandmother. There is no history of Anesthesia problems, Hypotension, Malignant hyperthermia, or Pseudochol deficiency.  ROS:   Please see the history of present illness.     All other systems reviewed and are negative.  EKGs/Labs/Other Studies Reviewed:    EKG:  EKG is not ordered today.   Renal duplex 2024 Summary:  Largest Aortic Diameter: 2.8 cm    Renal:    Right: Normal size right kidney. Normal right Resisitive Index.         Normal cortical thickness of right kidney. No evidence of         right renal artery stenosis. RRV flow present.  Left:  Normal size of left kidney. Normal left Resistive Index.         Normal cortical thickness of the left kidney. No evidence of         left renal artery stenosis. LRV flow present.  Mesenteric:  Normal Celiac artery and Superior Mesenteric artery findings.  Patent IVC.     Echo 02/01/20  1. Left ventricular ejection fraction, by estimation, is 55 to 60%. The  left ventricle has normal function. The left ventricle has no regional  wall motion abnormalities. There is  moderate left ventricular hypertrophy.  Left ventricular diastolic  parameters are consistent with Grade I diastolic dysfunction (impaired  relaxation).   2. Right ventricular systolic function is normal. The right ventricular  size is normal. Tricuspid regurgitation signal is inadequate for assessing  PA pressure.   3. The mitral valve is grossly normal. No evidence of mitral valve  regurgitation. No evidence of mitral stenosis.   4. The aortic valve is tricuspid. Aortic valve regurgitation is not  visualized. No aortic stenosis is present.   5. The inferior vena cava is normal in size with greater than 50%  respiratory variability, suggesting right atrial pressure of 3 mmHg.    LHC 02/02/20 Mid LAD lesion is 80% stenosed. A drug-eluting stent was successfully placed using a STENT RESOLUTE ONYX 3.5X18. Post intervention, there is a 0% residual stenosis. The left ventricular systolic function is normal. LV end diastolic pressure is normal. The left ventricular ejection fraction is 55-65% by visual estimate. There is no mitral valve regurgitation.   1. Severe stenosis mid LAD. Flow limiting by DFR 0.86.  2. Successful PTCA/DES x 1 mid LAD 3. No obstructive disease in the RCA or Circumflex 4. Normal LV systolic function   Recommendations: Continue DAPT for one year with ASA and Brilinta. Will start high intensity statin. Continue beta blocker. Echo later today.   Recent Labs: 04/07/2022: TSH 1.640 10/01/2022: ALT 28; BUN 12; Creatinine, Ser 0.74; Potassium 3.8; Sodium 144   Recent Lipid Panel    Component Value Date/Time   CHOL 123 10/01/2022 0811   TRIG 62 10/01/2022 0811   HDL 52 10/01/2022 0811   CHOLHDL 2.4 10/01/2022 0811   CHOLHDL 2.9 02/02/2020 0830   VLDL 15 02/02/2020 0830   LDLCALC 58 10/01/2022 0811   LDLCALC 91 11/05/2017 0906    Physical Exam:   VS:  BP 122/87 (BP Location: Left Arm, Patient Position: Sitting, Cuff Size: Large)   Pulse 93   Ht 5\' 3"  (1.6 m)    Wt (!) 307 lb 6.4 oz (139.4 kg)   SpO2 96%   BMI 54.45 kg/m  , BMI Body mass index is 54.45 kg/m. GENERAL:  Well appearing, overweight HEENT: Pupils equal round and reactive, fundi not visualized, oral mucosa unremarkable NECK:  No jugular venous distention, waveform within normal limits, carotid upstroke brisk and symmetric, no bruits, no thyromegaly LYMPHATICS:  No cervical adenopathy LUNGS:  Clear to auscultation bilaterally HEART:  RRR.  PMI not displaced or sustained,S1 and S2 within normal limits, no S3, no S4, no clicks, no rubs, no murmurs ABD:  Flat, positive bowel sounds normal in frequency in pitch, no bruits, no rebound, no guarding, no midline pulsatile mass, no hepatomegaly, no splenomegaly EXT:  2 plus pulses throughout, no edema, no cyanosis no clubbing SKIN:  No rashes no nodules NEURO:  Cranial nerves II through XII grossly intact, motor grossly intact throughout PSYCH:  Cognitively intact, oriented to person place and time   ASSESSMENT/PLAN:    HTN - BP is at goal. Reduce Amlodipine due to LE edema.  Secondary workup: OSA treated, as below.  Labs including renin aldosterone, cortisol, catecholamines, metanephrines were unremarkable Renal duplex with no stenosis.  Medication management: Continue Triamterene-HCTZ 75-50mg  QD, Labetolol 200mg  BID,  Valsartan 80mg  QD Due to LE edema reduce Amlodipine to 5mg  QD Discussed to monitor BP at home at least 2 hours after medications and sitting for 5-10 minutes.   OSA - Noted by recent sleep study. CPAP ordered and awaiting delivery from DME company.   CAD - Stable with no anginal symptoms. No indication for ischemic evaluation.  GDMT aspirin, atorvastatin. Heart healthy diet and regular cardiovascular exercise encouraged.    HLD, LDL goal <70 - Continue atorvastatin 80mg  QD. 10/01/22 LDL 58, triglycerides 62. Given how low her triglycerides are may be reasonable to trial off fenofibrate, will discontinue today and update  FLP/LFT in 2-3 months to ensure at goal.  Screening for Secondary Hypertension:     11/14/2022    7:36 PM  Causes  Renovascular HTN Screened  Sleep Apnea Screened  Thyroid Disease Screened  Hyperaldosteronism Screened  Pheochromocytoma Screened  Cushing's Syndrome Screened  Coarctation of the Aorta Screened    Relevant Labs/Studies:    Latest Ref Rng & Units 10/01/2022    8:11 AM 04/07/2022   11:18 AM 10/30/2021    7:29 PM  Basic Labs  Sodium 134 - 144 mmol/L 144  139    Potassium 3.5 - 5.2 mmol/L 3.8  3.7    Creatinine 0.57 - 1.00 mg/dL 4.03  4.74  2.59        Latest Ref Rng & Units 04/07/2022   11:18 AM 10/18/2020    9:48 AM  Thyroid   TSH 0.450 - 4.500 uIU/mL 1.640  1.400        Latest Ref Rng & Units 11/19/2022   10:15 AM 01/03/2014    8:54 AM  Renin/Aldosterone   Aldosterone 0.0 - 30.0 ng/dL 56.3  9   Aldos/Renin Ratio 0.0 - 30.0 26.2         Latest Ref Rng & Units 11/19/2022   10:15 AM  Metanephrines/Catecholamines   Epinephrine 0 - 62 pg/mL <15   Norepinephrine 0 - 874 pg/mL 335   Dopamine 0 - 48 pg/mL <30   Metanephrines 0.0 - 88.0 pg/mL <25.0   Normetanephrines  0.0 - 218.9 pg/mL 71.6           12/01/2022   11:09 AM  Renovascular   Renal Artery [place] Completed Yes     Disposition:    FU with MD/PharmD in 4 months    Medication Adjustments/Labs and Tests Ordered: Current medicines are reviewed at length with the patient today.  Concerns regarding medicines are outlined above.  Orders  Placed This Encounter  Procedures   Comprehensive metabolic panel   Lipid panel   Meds ordered this encounter  Medications   amLODipine (NORVASC) 5 MG tablet    Sig: Take 1 tablet (5 mg total) by mouth daily.    Dispense:  90 tablet    Refill:  3    Order Specific Question:   Supervising Provider    Answer:   Jodelle Red [[id number]]     Signed, Alver Sorrow, NP  01/22/2023 1:26 PM    [facility]

## 2023-01-22 NOTE — Patient Instructions (Addendum)
Medication Instructions:  Your physician has recommended you make the following change in your medication:   STOP Fenofibrate  REDUCE Amlodipine to 5mg  daily   Labwork: Your physician recommends that you return for lab work in 2-3 months for fasting lipid panel and CMP at the [facility] in [place].     Follow-Up: In 4 months with Advanced Hypertension Clinic    Special Instructions:   To prevent or reduce lower extremity swelling: Eat a low salt diet. Salt makes the body hold onto extra fluid which causes swelling. Sit with legs elevated. For example, in the recliner or on an ottoman.  Wear knee-high compression stockings during the daytime. Ones labeled 15-20 mmHg provide good compression.

## 2023-02-07 DIAGNOSIS — R030 Elevated blood-pressure reading, without diagnosis of hypertension: Secondary | ICD-10-CM | POA: Diagnosis not present

## 2023-02-07 DIAGNOSIS — S61203A Unspecified open wound of left middle finger without damage to nail, initial encounter: Secondary | ICD-10-CM | POA: Diagnosis not present

## 2023-03-02 DIAGNOSIS — H35031 Hypertensive retinopathy, right eye: Secondary | ICD-10-CM | POA: Diagnosis not present

## 2023-03-24 ENCOUNTER — Ambulatory Visit: Payer: BC Managed Care – PPO | Admitting: Family Medicine

## 2023-03-24 ENCOUNTER — Encounter (HOSPITAL_BASED_OUTPATIENT_CLINIC_OR_DEPARTMENT_OTHER): Payer: Self-pay

## 2023-04-08 ENCOUNTER — Ambulatory Visit: Payer: BC Managed Care – PPO | Admitting: Family Medicine

## 2023-04-08 VITALS — BP 110/70 | HR 86 | Ht 63.0 in | Wt 293.1 lb

## 2023-04-08 DIAGNOSIS — E785 Hyperlipidemia, unspecified: Secondary | ICD-10-CM

## 2023-04-08 DIAGNOSIS — R7303 Prediabetes: Secondary | ICD-10-CM

## 2023-04-08 DIAGNOSIS — I214 Non-ST elevation (NSTEMI) myocardial infarction: Secondary | ICD-10-CM

## 2023-04-08 DIAGNOSIS — Z23 Encounter for immunization: Secondary | ICD-10-CM | POA: Diagnosis not present

## 2023-04-08 DIAGNOSIS — G4733 Obstructive sleep apnea (adult) (pediatric): Secondary | ICD-10-CM

## 2023-04-08 DIAGNOSIS — I10 Essential (primary) hypertension: Secondary | ICD-10-CM | POA: Diagnosis not present

## 2023-04-08 MED ORDER — LABETALOL HCL 200 MG PO TABS: 200.0000 mg | ORAL_TABLET | Freq: Two times a day (BID) | ORAL | 3 refills | Status: DC

## 2023-04-08 MED ORDER — TRIAMTERENE-HCTZ 75-50 MG PO TABS: 1.0000 | ORAL_TABLET | Freq: Every day | ORAL | 3 refills | Status: DC

## 2023-04-08 MED ORDER — WEGOVY 0.25 MG/0.5ML ~~LOC~~ SOAJ: 0.2500 mg | SUBCUTANEOUS | 0 refills | Status: DC

## 2023-04-08 NOTE — Patient Instructions (Signed)
Follow-up in 7 weeks, call if you need me sooner.    New to help with weight loss is Wegovy starting at the dose of 0.25 mg once weekly.  Please let me know if you do or do not get the medication I believe you will qualify for it.  Shingrix No. 1 today.  Labs already ordered today.  Please start meal prepping as we discussed and start drinking unsweetened green tea for the caffeine.  Important to continue to check on the CPAP machine as when you get this you will feel much better and be healthy.  Congrats on weight that you have lost.  Thanks for choosing [facility], we consider it a privelige to serve you.

## 2023-04-09 ENCOUNTER — Encounter: Payer: Self-pay | Admitting: Family Medicine

## 2023-04-09 DIAGNOSIS — R7303 Prediabetes: Secondary | ICD-10-CM | POA: Insufficient documentation

## 2023-04-09 LAB — CMP14+EGFR
ALT: 31 IU/L (ref 0–32)
AST: 26 IU/L (ref 0–40)
Albumin: 4.2 g/dL (ref 3.9–4.9)
Alkaline Phosphatase: 215 IU/L — ABNORMAL HIGH (ref 44–121)
BUN/Creatinine Ratio: 19 (ref 9–23)
BUN: 15 mg/dL (ref 6–24)
Bilirubin Total: 1.4 mg/dL — ABNORMAL HIGH (ref 0.0–1.2)
CO2: 24 mmol/L (ref 20–29)
Calcium: 9.6 mg/dL (ref 8.7–10.2)
Chloride: 104 mmol/L (ref 96–106)
Creatinine, Ser: 0.81 mg/dL (ref 0.57–1.00)
Globulin, Total: 2.5 g/dL (ref 1.5–4.5)
Glucose: 99 mg/dL (ref 70–99)
Potassium: 3.6 mmol/L (ref 3.5–5.2)
Sodium: 143 mmol/L (ref 134–144)
Total Protein: 6.7 g/dL (ref 6.0–8.5)
eGFR: 88 mL/min/{1.73_m2} (ref >59)

## 2023-04-09 LAB — LIPID PANEL
Chol/HDL Ratio: 2.2 ratio (ref 0.0–4.4)
Cholesterol, Total: 123 mg/dL (ref 100–199)
HDL: 55 mg/dL (ref >39)
LDL Chol Calc (NIH): 53 mg/dL (ref 0–99)
Triglycerides: 75 mg/dL (ref 0–149)
VLDL Cholesterol Cal: 15 mg/dL (ref 5–40)

## 2023-04-09 LAB — HEMOGLOBIN A1C
Est. average glucose Bld gHb Est-mCnc: 120 mg/dL
Hgb A1c MFr Bld: 5.8 % — ABNORMAL HIGH (ref 4.8–5.6)

## 2023-04-09 NOTE — Assessment & Plan Note (Signed)
Established CAD, will rx wegovy for weight loss

## 2023-04-09 NOTE — Assessment & Plan Note (Signed)
  Patient re-educated about  the importance of commitment to a  minimum of 150 minutes of exercise per week as able.  The importance of healthy food choices with portion control discussed, as well as eating regularly and within a 12 hour window most days. The need to choose "clean , green" food 50 to 75% of the time is discussed, as well as to make water the primary drink and set a goal of 64 ounces water daily.       04/08/2023    9:56 AM 01/22/2023   10:25 AM 11/13/2022    9:10 AM  Weight /BMI  Weight 293 lb 1.9 oz 307 lb 6.4 oz 300 lb  Height 5\' 3"  (1.6 m) 5\' 3"  (1.6 m) 5\' 3"  (1.6 m)  BMI 51.92 kg/m2 54.45 kg/m2 53.14 kg/m2    Improved , should qualify for wegovy because of arterial disease

## 2023-04-09 NOTE — Assessment & Plan Note (Signed)
Waiting on CPAP machine

## 2023-04-09 NOTE — Assessment & Plan Note (Signed)
Blood sugar jhasd invcreased Patient educated about the importance of limiting  Carbohydrate intake , the need to commit to daily physical activity for a minimum of 30 minutes , and to commit weight loss. The fact that changes in all these areas will reduce or eliminate all together the development of diabetes is stressed.      Latest Ref Rng & Units 04/08/2023   10:35 AM 10/01/2022    8:11 AM 04/07/2022   11:22 AM 04/07/2022   11:18 AM 10/30/2021    7:29 PM  Diabetic Labs  HbA1c 4.8 - 5.6 % 5.8   5.3     Chol 100 - 199 mg/dL 578  469   629    HDL >52 mg/dL 55  52   59    Calc LDL 0 - 99 mg/dL 53  58   841    Triglycerides 0 - 149 mg/dL 75  62   90    Creatinine 0.57 - 1.00 mg/dL 3.24  4.01   0.27  2.53       04/08/2023   10:16 AM 04/08/2023    9:56 AM 01/22/2023   10:25 AM 11/13/2022    9:11 AM 11/13/2022    9:10 AM 11/06/2022    2:09 PM 11/06/2022    1:38 PM  BP/Weight  Systolic BP 110 119 122 115 127 128 132  Diastolic BP 70 73 87 82 84 82 87  Wt. (Lbs)  293.12 307.4  300  297.04  BMI  51.92 kg/m2 54.45 kg/m2  53.14 kg/m2  52.62 kg/m2       No data to display

## 2023-04-09 NOTE — Assessment & Plan Note (Signed)
Controlled, no change in medication DASH diet and commitment to daily physical activity for a minimum of 30 minutes discussed and encouraged, as a part of hypertension management. The importance of attaining a healthy weight is also discussed.     04/08/2023   10:16 AM 04/08/2023    9:56 AM 01/22/2023   10:25 AM 11/13/2022    9:11 AM 11/13/2022    9:10 AM 11/06/2022    2:09 PM 11/06/2022    1:38 PM  BP/Weight  Systolic BP 110 119 122 115 127 128 132  Diastolic BP 70 73 87 82 84 82 87  Wt. (Lbs)  293.12 307.4  300  297.04  BMI  51.92 kg/m2 54.45 kg/m2  53.14 kg/m2  52.62 kg/m2

## 2023-04-09 NOTE — Assessment & Plan Note (Signed)
Hyperlipidemia:Low fat diet discussed and encouraged.   Lipid Panel  Lab Results  Component Value Date   CHOL 123 04/08/2023   HDL 55 04/08/2023   LDLCALC 53 04/08/2023   TRIG 75 04/08/2023   CHOLHDL 2.2 04/08/2023     At goal no med change

## 2023-04-09 NOTE — Progress Notes (Signed)
[name]     MRN: [id number]      DOB: 1971/02/03  Chief Complaint  Patient presents with   Follow-up    Follow up     HPI [name] is here for follow up and re-evaluation of chronic medical conditions, medication management and review of any available recent lab and radiology data.  Preventive health is updated, specifically  Cancer screening and Immunization.   Questions or concerns regarding consultations or procedures which the PT has had in the interim are  addressed.dx with OSA and still waiting on cPAP machine The PT denies any adverse reactions to current medications since the last visit.  She is unable to do any regular exercise as her work hours are on average 16/day and is still working on organizing healthy diet, she depends on sodas throughout the day for calories and is eating very little, weight continues to increase as a result.  Requesting help with weight loss will try again to see based on her established coronary arterial disease she does have a stent that she will qualify for [facility] otherwise she is at this stage with she would like to get bariatric surgery. ROS Denies recent fever or chills. Denies sinus pressure, nasal congestion, ear pain or sore throat. Denies chest congestion, productive cough or wheezing. Denies chest pains, palpitations and leg swelling Denies abdominal pain, nausea, vomiting,diarrhea or constipation.   Denies dysuria, frequency, hesitancy or incontinence. Denies joint pain, swelling and limitation in mobility. Denies headaches, seizures, numbness, or tingling. Denies depression, anxiety or insomnia. Denies skin break down or rash.   PE  BP 110/70   Pulse 86   Ht 5\' 3"  (1.6 m)   Wt 293 lb 1.9 oz (133 kg)   SpO2 93%   BMI 51.92 kg/m   Patient alert and oriented and in no cardiopulmonary distress.  HEENT: No facial asymmetry, EOMI,     Neck supple .  Chest: Clear to auscultation bilaterally.  CVS: S1, S2 no murmurs, no  S3.Regular rate.  ABD: Soft non tender.   Ext: No edema  MS: Adequate ROM spine, shoulders, hips and knees.  Skin: Intact, no ulcerations or rash noted.  Psych: Good eye contact, normal affect. Memory intact not anxious or depressed appearing.  CNS: CN 2-12 intact, power,  normal throughout.no focal deficits noted.   Assessment & Plan  Malignant hypertension Controlled, no change in medication DASH diet and commitment to daily physical activity for a minimum of 30 minutes discussed and encouraged, as a part of hypertension management. The importance of attaining a healthy weight is also discussed.     04/08/2023   10:16 AM 04/08/2023    9:56 AM 01/22/2023   10:25 AM 11/13/2022    9:11 AM 11/13/2022    9:10 AM 11/06/2022    2:09 PM 11/06/2022    1:38 PM  BP/Weight  Systolic BP 110 119 122 115 127 128 132  Diastolic BP 70 73 87 82 84 82 87  Wt. (Lbs)  293.12 307.4  300  297.04  BMI  51.92 kg/m2 54.45 kg/m2  53.14 kg/m2  52.62 kg/m2       Morbid obesity  Patient re-educated about  the importance of commitment to a  minimum of 150 minutes of exercise per week as able.  The importance of healthy food choices with portion control discussed, as well as eating regularly and within a 12 hour window most days. The need to choose "clean , green" food 50 to  75% of the time is discussed, as well as to make water the primary drink and set a goal of 64 ounces water daily.       04/08/2023    9:56 AM 01/22/2023   10:25 AM 11/13/2022    9:10 AM  Weight /BMI  Weight 293 lb 1.9 oz 307 lb 6.4 oz 300 lb  Height 5\' 3"  (1.6 m) 5\' 3"  (1.6 m) 5\' 3"  (1.6 m)  BMI 51.92 kg/m2 54.45 kg/m2 53.14 kg/m2    Improved , should qualify for wegovy because of arterial disease  Hyperlipidemia LDL goal <70 Hyperlipidemia:Low fat diet discussed and encouraged.   Lipid Panel  Lab Results  Component Value Date   CHOL 123 04/08/2023   HDL 55 04/08/2023   LDLCALC 53 04/08/2023   TRIG 75 04/08/2023    CHOLHDL 2.2 04/08/2023     At goal no med change  Prediabetes Blood sugar jhasd invcreased Patient educated about the importance of limiting  Carbohydrate intake , the need to commit to daily physical activity for a minimum of 30 minutes , and to commit weight loss. The fact that changes in all these areas will reduce or eliminate all together the development of diabetes is stressed.      Latest Ref Rng & Units 04/08/2023   10:35 AM 10/01/2022    8:11 AM 04/07/2022   11:22 AM 04/07/2022   11:18 AM 10/30/2021    7:29 PM  Diabetic Labs  HbA1c 4.8 - 5.6 % 5.8   5.3     Chol 100 - 199 mg/dL 161  096   045    HDL >40 mg/dL 55  52   59    Calc LDL 0 - 99 mg/dL 53  58   981    Triglycerides 0 - 149 mg/dL 75  62   90    Creatinine 0.57 - 1.00 mg/dL 1.91  4.78   2.95  6.21       04/08/2023   10:16 AM 04/08/2023    9:56 AM 01/22/2023   10:25 AM 11/13/2022    9:11 AM 11/13/2022    9:10 AM 11/06/2022    2:09 PM 11/06/2022    1:38 PM  BP/Weight  Systolic BP 110 119 122 115 127 128 132  Diastolic BP 70 73 87 82 84 82 87  Wt. (Lbs)  293.12 307.4  300  297.04  BMI  51.92 kg/m2 54.45 kg/m2  53.14 kg/m2  52.62 kg/m2       No data to display            OSA (obstructive sleep apnea) Waiting on CPAP machine  NSTEMI (non-ST elevated myocardial infarction) (HCC) Established CAD, will rx wegovy for weight loss

## 2023-04-13 ENCOUNTER — Telehealth: Payer: Self-pay | Admitting: *Deleted

## 2023-04-13 NOTE — Telephone Encounter (Signed)
Contacted to schedule PREP Class. She is interested in participating in the July 16th T/TH 1610-9604 class at the [facility]. The class will run for 12 weeks. Intake assessment scheduled for 04/21/2023 at 1500.

## 2023-04-22 ENCOUNTER — Encounter: Payer: Self-pay | Admitting: *Deleted

## 2023-04-22 NOTE — Progress Notes (Signed)
[facility] PREP Evaluation  Patient Details  Name: [name] MRN: [id number] Date of Birth: April 25, 1971 Age: 52 y.o. PCP: Kerri Perches, MD  Vitals:   04/21/23 1330  BP: 134/76  Pulse: 94  Resp: 20  SpO2: 98%  Weight: 296 lb 12.8 oz (134.6 kg)     [facility] Eval - 04/21/23 1330       [facility] "PREP" Location   [facility] "PREP" Location [facility]      Referral    Referring Provider Walker    Reason for referral High Cholesterol;Hypertension;Obesitity/Overweight;Inactivity    Program Start Date 04/28/23    Program End Date 07/16/23      Measurement   Waist Circumference 55.25 inches    Hip Circumference 59 inches    Body fat --   would not calibrate     Information for Trainer   Goals Weight loss 10-12lbs in 12 weeks, establish exercise routine, healthier diet, social support    Current Exercise [employer] twice a week but not consistently    Orthopedic Concerns sore feet after working all day    Pertinent Medical History HTN, high cholesterol, MI 2022    Current Barriers none noter    Medications that affect exercise Beta blocker      Mobility and Daily Activities   I find it easy to walk up or down two or more flights of stairs. 2    I have no trouble taking out the trash. 4    I do housework such as vacuuming and dusting on my own without difficulty. 4    I can easily lift a gallon of milk (8lbs). 4    I can easily walk a mile. 2    I have no trouble reaching into high cupboards or reaching down to pick up something from the floor. 4    I do not have trouble doing out-door work such as Loss adjuster, chartered, raking leaves, or gardening. 4      Mobility and Daily Activities   I feel younger than my age. 4    I feel independent. 4    I feel energetic. 2    I live an active life.  4    I feel strong. 4    I feel healthy. 2    I feel active as other people my age. 4      How fit and strong are you.   Fit and Strong Total Score 48            Past Medical  History:  Diagnosis Date   CAD (coronary artery disease), native coronary artery    s/p DES to LAD 02/01/20   Chest pain 01/2020   Facial numbness    Fibroid    Fx ankle    Hypertension    Myocardial infarction (HCC)    Obesity    PONV (postoperative nausea and vomiting)    Past Surgical History:  Procedure Laterality Date   APPENDECTOMY     APPLICATION OF CRANIAL NAVIGATION N/A 08/16/2019   Procedure: APPLICATION OF CRANIAL NAVIGATION;  Surgeon: Jadene Pierini, MD;  Location: [facility] OR;  Service: Neurosurgery;  Laterality: N/A;   bilateral breast reduction  2004   CHOLECYSTECTOMY     COLONOSCOPY WITH PROPOFOL N/A 05/07/2021   Procedure: COLONOSCOPY WITH PROPOFOL;  Surgeon: Dolores Frame, MD;  [facility];  Service: Gastroenterology;  Laterality: N/A;  7:30   CORONARY PRESSURE/FFR STUDY N/A 02/01/2020   Procedure: INTRAVASCULAR  PRESSURE WIRE/FFR STUDY;  Surgeon: Kathleene Hazel, MD;  Location: [facility] INVASIVE CV LAB;  Service: Cardiovascular;  Laterality: N/A;   CORONARY STENT INTERVENTION N/A 02/01/2020   Procedure: CORONARY STENT INTERVENTION;  Surgeon: Kathleene Hazel, MD;  Location: [facility] INV[facility] CV LAB;  Service: Cardiovascular;  Laterality: N/A;   CRANIOTOMY Left 08/16/2019   Procedure: Left craniotomy for tumor resection;  Surgeon: Jadene Pierini, MD;  Location: [facility] OR;  Service: Neurosurgery;  Laterality: Left;  Left craniotomy for tumor resection   LEFT HEART CATH AND CORONARY ANGIOGRAPHY N/A 02/01/2020   Procedure: LEFT HEART CATH AND CORONARY ANGIOGRAPHY;  Surgeon: Kathleene Hazel, MD;  Location: [facility] INVAS[facility] CV LAB;  Service: Cardiovascular;  Laterality: N/A;   POLYPECTOMY  05/07/2021   Procedure: POLYPECTOMY;  Surgeon: Marguerita Merles, Reuel Boom, MD;  Location: [facility];  Service: Gastroenterology;;   REDUCTION MAMMAPLASTY Bilateral    removal of left ovarian cyst  2003   SALPINGECTOMY     TUBAL LIGATION  2011   Women's,  removal of tubes 2013   Social History   Tobacco Use  Smoking Status Never  Smokeless Tobacco Never    [name] 04/22/2023, 1:23 PM

## 2023-04-28 ENCOUNTER — Encounter: Payer: Self-pay | Admitting: *Deleted

## 2023-04-28 NOTE — Progress Notes (Signed)
[facility] PREP Weekly Session  Patient Details  Name: [name] MRN: [id number] Date of Birth: [birth year] Age: 52 y.o. PCP: Kerri Perches, MD  There were no vitals filed for this visit.   [facility] Weekly seesion - 04/28/23 1400       [facility] "PREP" Location   [facility] "PREP" Location [facility]      Weekly Session   Topic Discussed Goal setting and welcome to the program   Text book review, weekly self reports, perceived exertion scale, tour of facilty.   Classes attended to date 1             [name] 04/28/2023, 2:39 PM

## 2023-05-12 ENCOUNTER — Encounter: Payer: Self-pay | Admitting: *Deleted

## 2023-05-12 NOTE — Progress Notes (Signed)
[facility] PREP Weekly Session  Patient Details  Name: [name] MRN: [id number] Date of Birth: 09-21-1971 Age: 52 y.o. PCP: Kerri Perches, MD  Vitals:   05/12/23 2108  Weight: 296 lb (134.3 kg)     [facility] Weekly seesion - 05/12/23 1300       [facility] "PREP" Location   [facility] "PREP" Location [facility]      Weekly Session   Topic Discussed Healthy eating tips;Eating for the season   Discuss macronutrients, carbohydrates, fats and protiens. Added sugars 24gms/day for women, 36gms/day for men. Sodium, limit intake 1500-2300mg /day.   Minutes exercised this week 150 minutes    Classes attended to date 3             [name] 05/12/2023, 9:10 PM

## 2023-05-20 ENCOUNTER — Encounter: Payer: Self-pay | Admitting: *Deleted

## 2023-05-20 NOTE — Progress Notes (Signed)
[facility] PREP Weekly Session  Patient Details  Name: [name] MRN: [id number] Date of Birth: 1971/09/04 Age: 52 y.o. PCP: Kerri Perches, MD  Vitals:   05/19/23 1130  Weight: 294 lb (133.4 kg)     [facility] Weekly seesion - 05/19/23 1300       [facility] "PREP" Location   [facility] "PREP" Location [facility]      Weekly Session   Topic Discussed Health habits;Water   Review healthy eating, tips for maintaining healthy lifestyle, discuss effects of sugar and sugar demo   Minutes exercised this week 125 minutes    Classes attended to date 5             [name] 05/20/2023, 8:46 AM

## 2023-05-21 ENCOUNTER — Encounter (HOSPITAL_BASED_OUTPATIENT_CLINIC_OR_DEPARTMENT_OTHER): Payer: BC Managed Care – PPO | Admitting: Family

## 2023-05-23 ENCOUNTER — Other Ambulatory Visit: Payer: Self-pay | Admitting: Family Medicine

## 2023-05-26 ENCOUNTER — Encounter: Payer: Self-pay | Admitting: *Deleted

## 2023-05-27 NOTE — Progress Notes (Signed)
[facility] PREP Weekly Session  Patient Details  Name: [name] MRN: [id number] Date of Birth: 05/01/1971 Age: 52 y.o. PCP: Kerri Perches, MD  Vitals:   05/26/23 1130  Weight: 297 lb (134.7 kg)     [facility] Weekly seesion - 05/26/23 1300       [facility] "PREP" Location   [facility] "PREP" Location [facility]      Weekly Session   Topic Discussed Restaurant Eating   Tips for eating out and making healthier choices. Limit Salt intake to 1500-2300mg /day, salt demo.   Minutes exercised this week 255 minutes    Classes attended to date 6             [name] 05/27/2023, 10:05 AM

## 2023-06-03 ENCOUNTER — Ambulatory Visit: Payer: BC Managed Care – PPO | Admitting: Family Medicine

## 2023-06-03 ENCOUNTER — Encounter: Payer: Self-pay | Admitting: Family Medicine

## 2023-06-03 VITALS — BP 120/82 | HR 92 | Ht 63.0 in | Wt 294.0 lb

## 2023-06-03 DIAGNOSIS — W19XXXA Unspecified fall, initial encounter: Secondary | ICD-10-CM | POA: Diagnosis not present

## 2023-06-03 DIAGNOSIS — I10 Essential (primary) hypertension: Secondary | ICD-10-CM

## 2023-06-03 DIAGNOSIS — Z1231 Encounter for screening mammogram for malignant neoplasm of breast: Secondary | ICD-10-CM

## 2023-06-03 DIAGNOSIS — M5442 Lumbago with sciatica, left side: Secondary | ICD-10-CM

## 2023-06-03 DIAGNOSIS — M79672 Pain in left foot: Secondary | ICD-10-CM

## 2023-06-03 DIAGNOSIS — M79671 Pain in right foot: Secondary | ICD-10-CM

## 2023-06-03 DIAGNOSIS — R7303 Prediabetes: Secondary | ICD-10-CM

## 2023-06-03 DIAGNOSIS — Z23 Encounter for immunization: Secondary | ICD-10-CM | POA: Diagnosis not present

## 2023-06-03 MED ORDER — MELOXICAM 15 MG PO TABS: ORAL_TABLET | ORAL | 0 refills | Status: DC

## 2023-06-03 NOTE — Progress Notes (Signed)
[name]     MRN: [id number]      DOB: 1971/07/11  Chief Complaint  Patient presents with   Follow-up    Follow up pain in back and legs from fall 2 days ago     HPI [name] is here for follow up and re-evaluation of chronic medical conditions, medication management and review of any available recent lab and radiology data.  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 Denies sinus pressure, nasal congestion, ear pain or sore throat. Denies chest congestion, productive cough or wheezing. Denies chest pains, palpitations and leg swelling Denies abdominal pain, nausea, vomiting,diarrhea or constipation.   Denies dysuria, frequency, hesitancy or incontinence. . Denies headaches, seizures, numbness, or tingling. Denies depression, anxiety or insomnia. Denies skin break down or rash.   PE  BP 120/82   Pulse 92   Ht 5\' 3"  (1.6 m)   Wt 294 lb 0.6 oz (133.4 kg)   SpO2 93%   BMI 52.09 kg/m   Patient alert and oriented and in no cardiopulmonary distress.  HEENT: No facial asymmetry, EOMI,     Neck supple .  Chest: Clear to auscultation bilaterally.  CVS: S1, S2 no murmurs, no S3.Regular rate.  ABD: Soft non tender.   Ext: No edema  MS: Decreased  ROM lumbar spine, tender on palpation of lumbar spine, normal I shoulders, hips and knees.  Skin: Intact, no ulcerations or rash noted.  Psych: Good eye contact, normal affect. Memory intact not anxious or depressed appearing.  CNS: CN 2-12 intact, power,  normal throughout.no focal  deficits noted.   Assessment & Plan  Acute midline low back pain with left-sided sciatica Trigger is recent fall, meloxicam prescribed for limited time and needs X ray also advised to see if company Doc will follow since occurred  at location of and immediately following business meeting  Falls, initial encounter  X ray and anti inflammatory course, may benefit from PT, encouraged to try to get managed through company ,as fal occurred after business meeting  Prediabetes Patient educated about the importance of limiting  Carbohydrate intake , the need to commit to daily physical activity for a minimum of 30 minutes , and to commit weight loss. The fact that changes in all these areas will reduce or eliminate all together the development of diabetes is stressed.  Blood sugar has increased     Latest Ref Rng & Units 04/08/2023   10:35 AM 10/01/2022    8:11 AM 04/07/2022   11:22 AM 04/07/2022   11:18 AM 10/30/2021    7:29 PM  Diabetic Labs  HbA1c 4.8 - 5.6 % 5.8   5.3     Chol 100 - 199 mg/dL 784  696   295    HDL >28 mg/dL 55  52   59    Calc LDL 0 - 99 mg/dL 53  58   413    Triglycerides 0 - 149 mg/dL 75  62   90  Creatinine 0.57 - 1.00 mg/dL 3.47  4.25   9.56  3.87       06/03/2023   11:24 AM 06/03/2023   10:41 AM 05/26/2023   11:30 AM 05/19/2023   11:30 AM 05/12/2023    9:08 PM 04/21/2023    1:30 PM 04/08/2023   10:16 AM  BP/Weight  Systolic BP 120 113    134 110  Diastolic BP 82 76    76 70  Wt. (Lbs)  294.04 297 294 296 296.8   BMI  52.09 kg/m2 52.61 kg/m2 52.08 kg/m2 52.43 kg/m2 52.58 kg/m2        No data to display            Malignant hypertension Controlled, no change in medication DASH diet and commitment to daily physical activity for a minimum of 30 minutes discussed and encouraged, as a part of hypertension management. The importance of attaining a healthy weight is also discussed.     06/03/2023   11:24 AM 06/03/2023   10:41 AM 05/26/2023   11:30 AM  05/19/2023   11:30 AM 05/12/2023    9:08 PM 04/21/2023    1:30 PM 04/08/2023   10:16 AM  BP/Weight  Systolic BP 120 113    134 110  Diastolic BP 82 76    76 70  Wt. (Lbs)  294.04 297 294 296 296.8   BMI  52.09 kg/m2 52.61 kg/m2 52.08 kg/m2 52.43 kg/m2 52.58 kg/m2        Morbid obesity  Patient re-educated about  the importance of commitment to a  minimum of 150 minutes of exercise per week as able.  The importance of healthy food choices with portion control discussed, as well as eating regularly and within a 12 hour window most days. The need to choose "clean , green" food 50 to 75% of the time is discussed, as well as to make water the primary drink and set a goal of 64 ounces water daily.       06/03/2023   10:41 AM 05/26/2023   11:30 AM 05/19/2023   11:30 AM  Weight /BMI  Weight 294 lb 0.6 oz 297 lb 294 lb  Height 5\' 3"  (1.6 m)    BMI 52.09 kg/m2 52.61 kg/m2 52.08 kg/m2    Unchanged, encouraged to moveforward with Bariatric surgery  Bilateral foot pain Worsening over the past 4 to 6 months, refer Podiatry

## 2023-06-03 NOTE — Patient Instructions (Addendum)
Annual exam in 4 MONTHS, call if you need me sooner  Shingrix #$2 today  Nurse pls give contact info re bariatric surgery  PLEASE SCHEDULE MAMMOGRAM AT CHECKOUT   X ray of lumbar spine at hospital today, and I recommend you speak with employer  regarding the fall   Meloxicam is prescribed for back pain  You are referred to podiatry regarding bilateral foot pain  Thanks for choosing [facility], we consider it a privelige to serve you.

## 2023-06-05 DIAGNOSIS — M79671 Pain in right foot: Secondary | ICD-10-CM | POA: Insufficient documentation

## 2023-06-05 DIAGNOSIS — M5442 Lumbago with sciatica, left side: Secondary | ICD-10-CM | POA: Insufficient documentation

## 2023-06-05 DIAGNOSIS — W19XXXA Unspecified fall, initial encounter: Secondary | ICD-10-CM | POA: Insufficient documentation

## 2023-06-05 NOTE — Assessment & Plan Note (Signed)
X ray and anti inflammatory course, may benefit from PT, encouraged to try to get managed through company ,as fal occurred after business meeting

## 2023-06-05 NOTE — Assessment & Plan Note (Signed)
Patient educated about the importance of limiting  Carbohydrate intake , the need to commit to daily physical activity for a minimum of 30 minutes , and to commit weight loss. The fact that changes in all these areas will reduce or eliminate all together the development of diabetes is stressed.  Blood sugar has increased     Latest Ref Rng & Units 04/08/2023   10:35 AM 10/01/2022    8:11 AM 04/07/2022   11:22 AM 04/07/2022   11:18 AM 10/30/2021    7:29 PM  Diabetic Labs  HbA1c 4.8 - 5.6 % 5.8   5.3     Chol 100 - 199 mg/dL 161  096   045    HDL >40 mg/dL 55  52   59    Calc LDL 0 - 99 mg/dL 53  58   981    Triglycerides 0 - 149 mg/dL 75  62   90    Creatinine 0.57 - 1.00 mg/dL 1.91  4.78   2.95  6.21       06/03/2023   11:24 AM 06/03/2023   10:41 AM 05/26/2023   11:30 AM 05/19/2023   11:30 AM 05/12/2023    9:08 PM 04/21/2023    1:30 PM 04/08/2023   10:16 AM  BP/Weight  Systolic BP 120 113    134 110  Diastolic BP 82 76    76 70  Wt. (Lbs)  294.04 297 294 296 296.8   BMI  52.09 kg/m2 52.61 kg/m2 52.08 kg/m2 52.43 kg/m2 52.58 kg/m2        No data to display

## 2023-06-05 NOTE — Assessment & Plan Note (Signed)
Trigger is recent fall, meloxicam prescribed for limited time and needs X ray also advised to see if company Doc will follow since occurred  at location of and immediately following business meeting

## 2023-06-05 NOTE — Assessment & Plan Note (Signed)
Worsening over the past 4 to 6 months, refer Podiatry

## 2023-06-05 NOTE — Assessment & Plan Note (Signed)
  Patient re-educated about  the importance of commitment to a  minimum of 150 minutes of exercise per week as able.  The importance of healthy food choices with portion control discussed, as well as eating regularly and within a 12 hour window most days. The need to choose "clean , green" food 50 to 75% of the time is discussed, as well as to make water the primary drink and set a goal of 64 ounces water daily.       06/03/2023   10:41 AM 05/26/2023   11:30 AM 05/19/2023   11:30 AM  Weight /BMI  Weight 294 lb 0.6 oz 297 lb 294 lb  Height 5\' 3"  (1.6 m)    BMI 52.09 kg/m2 52.61 kg/m2 52.08 kg/m2    Unchanged, encouraged to moveforward with Bariatric surgery

## 2023-06-05 NOTE — Assessment & Plan Note (Signed)
Controlled, no change in medication DASH diet and commitment to daily physical activity for a minimum of 30 minutes discussed and encouraged, as a part of hypertension management. The importance of attaining a healthy weight is also discussed.     06/03/2023   11:24 AM 06/03/2023   10:41 AM 05/26/2023   11:30 AM 05/19/2023   11:30 AM 05/12/2023    9:08 PM 04/21/2023    1:30 PM 04/08/2023   10:16 AM  BP/Weight  Systolic BP 120 113    134 110  Diastolic BP 82 76    76 70  Wt. (Lbs)  294.04 297 294 296 296.8   BMI  52.09 kg/m2 52.61 kg/m2 52.08 kg/m2 52.43 kg/m2 52.58 kg/m2

## 2023-06-08 ENCOUNTER — Encounter: Payer: Self-pay | Admitting: *Deleted

## 2023-06-08 NOTE — Progress Notes (Signed)
[facility] PREP Weekly Session  Patient Details  Name: [name] MRN: [id number] Date of Birth: 1970-11-26 Age: 52 y.o. PCP: Kerri Perches, MD  Vitals:   06/08/23 1130  Weight: 293 lb (132.9 kg)     [facility] Weekly seesion - 06/08/23 1300       [facility] "PREP" Location   [facility] "PREP" Location [facility]      Weekly Session   Topic Discussed Expectations and non-scale victories   Staying positive. Half way through program, restate goals, discuss barriers, refocus priorities, tips to help with motivation. Discuss Blue Zones and their commonalities.   Minutes exercised this week 130 minutes    Classes attended to date 56             [name] 06/08/2023, 1:42 PM

## 2023-06-16 ENCOUNTER — Encounter: Payer: Self-pay | Admitting: *Deleted

## 2023-06-16 NOTE — Progress Notes (Signed)
[facility] PREP Weekly Session  Patient Details  Name: [name] MRN: [id number] Date of Birth: Aug 25, 1971 Age: 52 y.o. PCP: Kerri Perches, MD  Vitals:   06/16/23 1130  Weight: 294 lb (133.4 kg)     [facility] Weekly seesion - 06/16/23 1300       [facility] "PREP" Location   [facility] "PREP" Location [facility]      Weekly Session   Topic Discussed Other   Portion Control, portion size visual aids. Being a good label dectective and recognizing deceptive labeling. Healthy eating plate.   Minutes exercised this week 135 minutes    Classes attended to date 85             [name] 06/16/2023, 8:30 PM

## 2023-06-18 ENCOUNTER — Encounter (HOSPITAL_BASED_OUTPATIENT_CLINIC_OR_DEPARTMENT_OTHER): Payer: Self-pay | Admitting: Family

## 2023-06-18 ENCOUNTER — Ambulatory Visit (HOSPITAL_BASED_OUTPATIENT_CLINIC_OR_DEPARTMENT_OTHER): Payer: BC Managed Care – PPO | Admitting: Family

## 2023-06-18 VITALS — BP 142/104 | HR 84 | Ht 63.0 in | Wt 301.0 lb

## 2023-06-18 DIAGNOSIS — G4733 Obstructive sleep apnea (adult) (pediatric): Secondary | ICD-10-CM | POA: Diagnosis not present

## 2023-06-18 DIAGNOSIS — E785 Hyperlipidemia, unspecified: Secondary | ICD-10-CM | POA: Diagnosis not present

## 2023-06-18 DIAGNOSIS — I25118 Atherosclerotic heart disease of native coronary artery with other forms of angina pectoris: Secondary | ICD-10-CM | POA: Diagnosis not present

## 2023-06-18 DIAGNOSIS — I10 Essential (primary) hypertension: Secondary | ICD-10-CM

## 2023-06-18 NOTE — Progress Notes (Signed)
Advanced Hypertension Clinic Assessment:    Date:  06/18/2023   ID:  [name], DOB 08/26/1971, MRN [id number]  PCP:  Kerri Perches, MD  Cardiologist:  Verne Carrow, MD  Nephrologist:  Referring MD: Kerri Perches, MD   CC: Hypertension  History of Present Illness:    [name] is a 52 y.o. female with a hx of CAD s/p anterior STEMI 01/2020 with DES-LAD, HTN, obesity, HLD. She is here to follow-up in the Advanced Hypertension Clinic. Referred by her primary care provider. She follows with Dr. Clifton James of cardiology.  Established with advanced hypertension clinic 11/13/2022.  At that time clonidine was discontinued as she was taking daily and there was concern for rebound hypertension.  Carvedilol was discontinued as she was also on labetalol.  Valsartan 80 mg daily was initiated.  Renal duplex as well as sleep study ordered.  Renal duplex 12/01/22 with no stenosis. Sleep study revealed OSA and she was started on CPAP with orders sent to DME 01/21/23.   Last seen 01/22/23. Due to edema, Amlodipine reduced from 10mg  to 5mg .   Presents today for follow-up independently.  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  Initially diagnosed with hypertension in 2000 pregnant with her daughter (this was her second pregnancy). Blood pressure checked with wrist cuff at home which has been checked for accuracy.  Participating in PREP exercise class. Notes swelling is overall unchanged since reducing her dose of Amlodipine. Reports BP at home has been well-controlled routinely less than 130/80.  Unfortunately she has been unable to get her CPAP she contacted the DME company but they have said they are behind and when she tries to call back is only able to leave a message and unable to reach them.   Previous antihypertensives: Lisinopril - cough Methyldopa (aldomet) - during pregnancy Carvedilol -stopped due to duplicate therapy with labetalol. Clonidine  -discontinue due to daily dosing and concern for rebound hypertension (tolerated patch previously)   Past Medical History:  Diagnosis Date   CAD (coronary artery disease), native coronary artery    s/p DES to LAD 02/01/20   Chest pain 01/2020   Facial numbness    Fibroid    Fx ankle    Hypertension    Myocardial infarction (HCC)    Obesity    PONV (postoperative nausea and vomiting)     Past Surgical History:  Procedure Laterality Date   APPENDECTOMY     APPLICATION OF CRANIAL NAVIGATION N/A 08/16/2019   Procedure: APPLICATION OF CRANIAL NAVIGATION;  Surgeon: Jadene Pierini, MD;  [facility];  Service: Neurosurgery;  Laterality: N/A;   bilateral breast reduction  2004   CHOLECYSTECTOMY     COLONOSCOPY WITH PROPOFOL N/A 05/07/2021   Procedure: COLONOSCOPY WITH PROPOFOL;  Surgeon: Dolores Frame, MD;  [facility];  Service: Gastroenterology;  Laterality: N/A;  7:30   CORONARY PRESSURE/FFR STUDY N/A 02/01/2020   Procedure: INTRAVASCULAR PRESSURE WIRE/FFR STUDY;  Surgeon: Kathleene Hazel, MD;  [facility];  Service: Cardiovascular;  Laterality: N/A;   CORONARY STENT INTERVENTION N/A 02/01/2020   Procedure: CORONARY STENT INTERVENTION;  Surgeon: Kathleene Hazel, MD;  [facility];  Service: Cardiovascular;  Laterality: N/A;   CRANIOTOMY Left 08/16/2019   Procedure: Left craniotomy for tumor resection;  Surgeon: Jadene Pierini, MD;  Location: [facility] OR;  Service: Neurosurgery;  Laterality: Left;  Left craniotomy for tumor resection   LEFT  HEART CATH AND CORONARY ANGIOGRAPHY N/A 02/01/2020   Procedure: LEFT HEART CATH AND CORONARY ANGIOGRAPHY;  Surgeon: Kathleene Hazel, MD;  [facility];  Service: Cardiovascular;  Laterality: N/A;   POLYPECTOMY  05/07/2021   Procedure: POLYPECTOMY;  Surgeon: Marguerita Merles, Reuel Boom, MD;  [facility];  Service: Gastroenterology;;    REDUCTION MAMMAPLASTY Bilateral    removal of left ovarian cyst  2003   SALPINGECTOMY     TUBAL LIGATION  2011   Women's, removal of tubes 2013    Current Medications: Current Meds  Medication Sig   acetaminophen (TYLENOL) 325 MG tablet Take 325 mg by mouth every 6 (six) hours as needed.   amLODipine (NORVASC) 10 MG tablet Take 1 tablet (10 mg total) by mouth daily.   aspirin 81 MG chewable tablet Chew 1 tablet (81 mg total) by mouth daily.   atorvastatin (LIPITOR) 80 MG tablet Take 1 tablet (80 mg total) by mouth daily.   cloNIDine (CATAPRES) 0.3 MG tablet TAKE 1 TABLET BY MOUTH AT BEDTIME   ergocalciferol (VITAMIN D2) 1.25 MG (50000 UT) capsule Take 1 capsule (50,000 Units total) by mouth once a week. One capsule once weekly   labetalol (NORMODYNE) 200 MG tablet Take 1 tablet by mouth twice daily   meloxicam (MOBIC) 15 MG tablet Take one tablet by mouth once daily for 5 days, then as needed, for pain   triamterene-hydrochlorothiazide (MAXZIDE) 75-50 MG tablet Take 1 tablet by mouth once daily   valsartan (DIOVAN) 80 MG tablet Take 1 tablet (80 mg total) by mouth daily.     Allergies:   Lisinopril   Social History   Socioeconomic History   Marital status: Single    Spouse name: Not on file   Number of children: 4   Years of education: Not on file   Highest education level: Some college, no degree  Occupational History   Not on file  Tobacco Use   Smoking status: Never   Smokeless tobacco: Never  Vaping Use   Vaping status: Never Used  Substance and Sexual Activity   Alcohol use: Yes    Comment: rarely   Drug use: No   Sexual activity: Yes    Birth control/protection: Surgical  Other Topics Concern   Not on file  Social History Narrative   Lives with children   Caffeine- sodas, 48 oz daily   Social Determinants of Health   Financial Resource Strain: Low Risk  (04/08/2023)   Overall Financial Resource Strain (CARDIA)    Difficulty of Paying Living Expenses: Not  very hard  Food Insecurity: No Food Insecurity (04/08/2023)   Hunger Vital Sign    Worried About Running Out of Food in the Last Year: Never true    Ran Out of Food in the Last Year: Never true  Transportation Needs: No Transportation Needs (04/08/2023)   PRAPARE - Administrator, Civil Service (Medical): No    Lack of Transportation (Non-Medical): No  Physical Activity: Insufficiently Active (04/08/2023)   Exercise Vital Sign    Days of Exercise per Week: 2 days    Minutes of Exercise per Session: 30 min  Stress: No Stress Concern Present (04/08/2023)   [employer] of Occupational Health - Occupational Stress Questionnaire    Feeling of Stress : Not at all  Social Connections: Moderately Integrated (04/08/2023)   Social Connection and Isolation Panel [NHANES]    Frequency of Communication with Friends and Family: More than three times a  week    Frequency of Social Gatherings with Friends and Family: More than three times a week    Attends Religious Services: More than 4 times per year    Active Member of [employer] or Organizations: Yes    Attends Engineer, structural: More than 4 times per year    Marital Status: Never married    Family History: The patient's family history includes Coronary artery disease in her father and mother; Heart attack in her maternal grandfather; Hypertension in her father, mother, and sister; Kidney disease in her father; Multiple sclerosis in her sister; Stroke in her maternal grandmother. There is no history of Anesthesia problems, Hypotension, Malignant hyperthermia, or Pseudochol deficiency.  ROS:   Please see the history of present illness.     All other systems reviewed and are negative.  EKGs/Labs/Other Studies Reviewed:    EKG Interpretation Date/Time:  Thursday June 18 2023 09:28:12 EDT Ventricular Rate:  84 PR Interval:  190 QRS Duration:  72 QT Interval:  370 QTC Calculation: 437 R Axis:   32  Text  Interpretation: Normal sinus rhythm Low voltage QRS Confirmed by Gillian Shields ([id number]) on 06/18/2023 9:35:19 AM    Renal duplex 2024 Summary:  Largest Aortic Diameter: 2.8 cm    Renal:    Right: Normal size right kidney. Normal right Resisitive Index.         Normal cortical thickness of right kidney. No evidence of         right renal artery stenosis. RRV flow present.  Left:  Normal size of left kidney. Normal left Resistive Index.         Normal cortical thickness of the left kidney. No evidence of         left renal artery stenosis. LRV flow present.  Mesenteric:  Normal Celiac artery and Superior Mesenteric artery findings.  Patent IVC.     Echo 02/01/20  1. Left ventricular ejection fraction, by estimation, is 55 to 60%. The  left ventricle has normal function. The left ventricle has no regional  wall motion abnormalities. There is moderate left ventricular hypertrophy.  Left ventricular diastolic  parameters are consistent with Grade I diastolic dysfunction (impaired  relaxation).   2. Right ventricular systolic function is normal. The right ventricular  size is normal. Tricuspid regurgitation signal is inadequate for assessing  PA pressure.   3. The mitral valve is grossly normal. No evidence of mitral valve  regurgitation. No evidence of mitral stenosis.   4. The aortic valve is tricuspid. Aortic valve regurgitation is not  visualized. No aortic stenosis is present.   5. The inferior vena cava is normal in size with greater than 50%  respiratory variability, suggesting right atrial pressure of 3 mmHg.    LHC 02/02/20 Mid LAD lesion is 80% stenosed. A drug-eluting stent was successfully placed using a STENT RESOLUTE ONYX 3.5X18. Post intervention, there is a 0% residual stenosis. The left ventricular systolic function is normal. LV end diastolic pressure is normal. The left ventricular ejection fraction is 55-65% by visual estimate. There is no mitral valve  regurgitation.   1. Severe stenosis mid LAD. Flow limiting by DFR 0.86.  2. Successful PTCA/DES x 1 mid LAD 3. No obstructive disease in the RCA or Circumflex 4. Normal LV systolic function   Recommendations: Continue DAPT for one year with ASA and Brilinta. Will start high intensity statin. Continue beta blocker. Echo later today.   Recent Labs: 04/08/2023: ALT 31; BUN 15; Creatinine, Ser  0.81; Potassium 3.6; Sodium 143   Recent Lipid Panel    Component Value Date/Time   CHOL 123 04/08/2023 1035   TRIG 75 04/08/2023 1035   HDL 55 04/08/2023 1035   CHOLHDL 2.2 04/08/2023 1035   CHOLHDL 2.9 02/02/2020 0830   VLDL 15 02/02/2020 0830   LDLCALC 53 04/08/2023 1035   LDLCALC 91 11/05/2017 0906    Physical Exam:   VS:  BP (!) 142/104 Comment: left arm, thigh cuff  Pulse 84   Ht 5\' 3"  (1.6 m)   Wt (!) 301 lb (136.5 kg)   BMI 53.32 kg/m  , BMI Body mass index is 53.32 kg/m. GENERAL:  Well appearing, overweight HEENT: Pupils equal round and reactive, fundi not visualized, oral mucosa unremarkable NECK:  No jugular venous distention, waveform within normal limits, carotid upstroke brisk and symmetric, no bruits, no thyromegaly LYMPHATICS:  No cervical adenopathy LUNGS:  Clear to auscultation bilaterally HEART:  RRR.  PMI not displaced or sustained,S1 and S2 within normal limits, no S3, no S4, no clicks, no rubs, no murmurs ABD:  Flat, positive bowel sounds normal in frequency in pitch, no bruits, no rebound, no guarding, no midline pulsatile mass, no hepatomegaly, no splenomegaly EXT:  2 plus pulses throughout, no edema, no cyanosis no clubbing SKIN:  No rashes no nodules NEURO:  Cranial nerves II through XII grossly intact, motor grossly intact throughout PSYCH:  Cognitively intact, oriented to person place and time   ASSESSMENT/PLAN:    HTN - BP is at goal by recent clinic visits. Elevated today as she has not yet taken her medications.  Secondary workup: OSA to be treated, as  below.  Labs including renin aldosterone, cortisol, catecholamines, metanephrines were unremarkable Renal duplex with no stenosis.  Medication management: Continue Triamterene-HCTZ 75-50mg  QD, Labetolol 200mg  BID,  Valsartan 80mg  every day, Amlodipine 5mg  daily.  Discussed to monitor BP at home at least 2 hours after medications and sitting for 5-10 minutes.   OSA - Noted by 01/2023 sleep study. CPAP ordered and was sent to [facility] but was told hwen she talked to them in April they were behind. She has called them back but been unable to reach. Will reach out to Dr. Mayford Knife and sleep team to inquire if can be sent to alternate DME company.   CAD - Stable with no anginal symptoms. No indication for ischemic evaluation.  GDMT aspirin, atorvastatin. Heart healthy diet and regular cardiovascular exercise encouraged.    HLD, LDL goal <70 - Continue atorvastatin 80mg  QD. 10/01/22 LDL 58, triglycerides 62. Fenofibrate discontinued at last visit with follow up labs 03/2023 triglycerides 75.   Screening for Secondary Hypertension:     11/14/2022    7:36 PM  Causes  Renovascular HTN Screened  Sleep Apnea Screened  Thyroid Disease Screened  Hyperaldosteronism Screened  Pheochromocytoma Screened  Cushing's Syndrome Screened  Coarctation of the Aorta Screened    Relevant Labs/Studies:    Latest Ref Rng & Units 04/08/2023   10:35 AM 10/01/2022    8:11 AM 04/07/2022   11:18 AM  Basic Labs  Sodium 134 - 144 mmol/L 143  144  139   Potassium 3.5 - 5.2 mmol/L 3.6  3.8  3.7   Creatinine 0.57 - 1.00 mg/dL 0.98  1.19  1.47        Latest Ref Rng & Units 04/07/2022   11:18 AM 10/18/2020    9:48 AM  Thyroid   TSH 0.450 - 4.500 uIU/mL 1.640  1.400  Latest Ref Rng & Units 11/19/2022   10:15 AM 01/03/2014    8:54 AM  Renin/Aldosterone   Aldosterone 0.0 - 30.0 ng/dL 16.1  9   Aldos/Renin Ratio 0.0 - 30.0 26.2         Latest Ref Rng & Units 11/19/2022   10:15 AM  Metanephrines/Catecholamines    Epinephrine 0 - 62 pg/mL <15   Norepinephrine 0 - 874 pg/mL 335   Dopamine 0 - 48 pg/mL <30   Metanephrines 0.0 - 88.0 pg/mL <25.0   Normetanephrines  0.0 - 218.9 pg/mL 71.6           12/01/2022   11:09 AM  Renovascular   Renal Artery [place] Completed Yes     Disposition:    Follow-up with Dr. Clifton James in January for annual recall and follow-up with hypertension clinic April 2025  Medication Adjustments/Labs and Tests Ordered: Current medicines are reviewed at length with the patient today.  Concerns regarding medicines are outlined above.  Orders Placed This Encounter  Procedures   EKG 12-Lead   No orders of the defined types were placed in this encounter.    Signed, Alver Sorrow, NP  06/18/2023 10:16 AM    [facility]

## 2023-06-18 NOTE — Patient Instructions (Signed)
Medication Instructions:  Continue your current medications.    Follow-Up: January 2024 with Dr. Clifton James as scheduled April 2025 with Hypertension Clinic (we will reach out closer to time to schedule)    Special Instructions:   Heart Healthy Diet Recommendations: A low-salt diet is recommended. Meats should be grilled, baked, or boiled. Avoid fried foods. Focus on lean protein sources like fish or chicken with vegetables and fruits. The American Heart Association is a Chief Technology Officer!  American Heart Association Diet and Lifeystyle Recommendations    Exercise recommendations: The American Heart Association recommends 150 minutes of moderate intensity exercise weekly. Try 30 minutes of moderate intensity exercise 4-5 times per week. This could include walking, jogging, or swimming.

## 2023-06-19 NOTE — Telephone Encounter (Signed)
DME  CHANGE: Via community message:  Upon patient request DME selection is [facility] Patient understands he will be contacted by [facility] to set up his cpap. Patient understands to call if [facility] does not contact him with new setup in a timely manner. Patient understands they will be called once confirmation has been received from [facility] that they have received their new machine to schedule 10 week follow up appointment.   [facility] notified of new cpap order  Please add to airview Patient was grateful for the call and thanked me.

## 2023-06-23 ENCOUNTER — Encounter: Payer: Self-pay | Admitting: *Deleted

## 2023-06-23 NOTE — Progress Notes (Signed)
[facility] PREP Weekly Session  Patient Details  Name: [name] MRN: [id number] Date of Birth: [birth year] Age: 52 y.o. PCP: Kerri Perches, MD  Vitals:   06/23/23 1130  Weight: 290 lb (131.5 kg)     [facility] Weekly seesion - 06/23/23 1300       [facility] "PREP" Location   [facility] "PREP" Location [facility]      Weekly Session   Topic Discussed Finding support   Accountability partners, ways to stay on track. Identifying non-supportive people. Planning for life after PREP.   Minutes exercised this week 215 minutes    Classes attended to date 13             [name] 06/23/2023, 8:52 PM

## 2023-06-30 ENCOUNTER — Encounter: Payer: Self-pay | Admitting: *Deleted

## 2023-07-01 NOTE — Progress Notes (Signed)
[facility] PREP Weekly Session  Patient Details  Name: [name] MRN: [id number] Date of Birth: 02-Jul-1971 Age: 52 y.o. PCP: Kerri Perches, MD  Vitals:   06/30/23 1130  Weight: 294 lb (133.4 kg)     [facility] Weekly seesion - 06/30/23 1330       [facility] "PREP" Location   [facility] "PREP" Location [facility]      Weekly Session   Topic Discussed Calorie breakdown   Review macronutrients and healthy eating. Importance of protein.   Minutes exercised this week 125 minutes    Classes attended to date 83             [name] 07/01/2023, 8:12 AM

## 2023-07-06 ENCOUNTER — Other Ambulatory Visit: Payer: Self-pay | Admitting: Family Medicine

## 2023-07-06 MED ORDER — LABETALOL HCL 200 MG PO TABS: 200.0000 mg | ORAL_TABLET | Freq: Two times a day (BID) | ORAL | 0 refills | Status: DC

## 2023-07-07 ENCOUNTER — Encounter: Payer: Self-pay | Admitting: *Deleted

## 2023-07-07 DIAGNOSIS — G4733 Obstructive sleep apnea (adult) (pediatric): Secondary | ICD-10-CM | POA: Diagnosis not present

## 2023-07-07 NOTE — Progress Notes (Signed)
[facility] PREP Weekly Session  Patient Details  Name: [name] MRN: [id number] Date of Birth: 04-18-1971 Age: 52 y.o. PCP: Kerri Perches, MD  Vitals:   07/07/23 1932  Weight: 295 lb (133.8 kg)     [facility] Weekly seesion - 07/07/23 1300       [facility] "PREP" Location   [facility] "PREP" Location [facility]      Weekly Session   Topic Discussed Hitting roadblocks   [employer] membership talk with Irving Burton, planning for life after PREP, reasonable goal setting, recognizing progress, tips to help curb late night snacking.   Minutes exercised this week 150 minutes    Classes attended to date 70             [name] 07/07/2023, 7:33 PM

## 2023-07-15 ENCOUNTER — Encounter: Payer: Self-pay | Admitting: *Deleted

## 2023-07-15 NOTE — Progress Notes (Signed)
[facility] PREP Evaluation  Patient Details  Name: [name] MRN: [id number] Date of Birth: Oct 07, 1971 Age: 52 y.o. PCP: Kerri Perches, MD  Vitals:   07/15/23 1840  BP: 118/78  Pulse: 90  Resp: 20  SpO2: 97%  Weight: 299 lb 6.4 oz (135.8 kg)     Past Medical History:  Diagnosis Date   CAD (coronary artery disease), native coronary artery    s/p DES to LAD 02/01/20   Chest pain 01/2020   Facial numbness    Fibroid    Fx ankle    Hypertension    Myocardial infarction (HCC)    Obesity    PONV (postoperative nausea and vomiting)    Past Surgical History:  Procedure Laterality Date   APPENDECTOMY     APPLICATION OF CRANIAL NAVIGATION N/A 08/16/2019   Procedure: APPLICATION OF CRANIAL NAVIGATION;  Surgeon: Jadene Pierini, MD;  [facility];  Service: Neurosurgery;  Laterality: N/A;   bilateral breast reduction  2004   CHOLECYSTECTOMY     COLONOSCOPY WITH PROPOFOL N/A 05/07/2021   Procedure: COLONOSCOPY WITH PROPOFOL;  Surgeon: Dolores Frame, MD;  [facility];  Service: Gastroenterology;  Laterality: N/A;  7:30   CORONARY PRESSURE/FFR STUDY N/A 02/01/2020   Procedure: INTRAVASCULAR PRESSURE WIRE/FFR STUDY;  Surgeon: Kathleene Hazel, MD;  [facility];  Service: Cardiovascular;  Laterality: N/A;   CORONARY STENT INTERVENTION N/A 02/01/2020   Procedure: CORONARY STENT INTERVENTION;  Surgeon: Kathleene Hazel, MD;  [facility];  Service: Cardiovascular;  Laterality: N/A;   CRANIOTOMY Left 08/16/2019   Procedure: Left craniotomy for tumor resection;  Surgeon: Jadene Pierini, MD;  Location: [facility] OR;  Service: Neurosurgery;  Laterality: Left;  Left craniotomy for tumor resection   LEFT HEART CATH AND CORONARY ANGIOGRAPHY N/A 02/01/2020   Procedure: LEFT HEART CATH AND CORONARY ANGIOGRAPHY;  Surgeon: Kathleene Hazel, MD;  [facility];  Service: Cardiovascular;  Laterality: N/A;    POLYPECTOMY  05/07/2021   Procedure: POLYPECTOMY;  Surgeon: Marguerita Merles, Reuel Boom, MD;  [facility];  Service: Gastroenterology;;   REDUCTION MAMMAPLASTY Bilateral    removal of left ovarian cyst  2003   SALPINGECTOMY     TUBAL LIGATION  2011   Women's, removal of tubes 2013   Social History   Tobacco Use  Smoking Status Never  Smokeless Tobacco Never    [name] 07/15/2023, 6:45 PM PREP Complete. June attended 10 of 12 education classes and 8 of 10 exercise classes. She was a very engaged participant and added to the positive camaraderie of the class. FIT testing showed significant improvement in all areas with "cardio march" up by 30 steps and "sit to stands" up by 5!  Hip/waist ratio 1.25"/0 inches lost in 12 weeks.Ceara has a busy schedule and off shift work hours which can make necessary lifestyle changes more challenging. She has only recently received her new CPAP device and states that this is improving her sleep quality. I believe PREP has had a positive impact on Annaliz and she understands the changes necessary to achieve her goals. She has a solid plan with achievable short term goals in place for continuing this journey to better health. Keep up the good work Alfredia!

## 2023-07-22 ENCOUNTER — Other Ambulatory Visit: Payer: Self-pay | Admitting: Family Medicine

## 2023-08-06 DIAGNOSIS — G4733 Obstructive sleep apnea (adult) (pediatric): Secondary | ICD-10-CM | POA: Diagnosis not present

## 2023-08-13 ENCOUNTER — Other Ambulatory Visit: Payer: Self-pay | Admitting: Family Medicine

## 2023-08-17 MED ORDER — LABETALOL HCL 200 MG PO TABS: 200.0000 mg | ORAL_TABLET | Freq: Two times a day (BID) | ORAL | 0 refills | Status: DC

## 2023-08-17 MED ORDER — TRIAMTERENE-HCTZ 75-50 MG PO TABS: 1.0000 | ORAL_TABLET | Freq: Every day | ORAL | 0 refills | Status: DC

## 2023-08-17 MED ORDER — CLONIDINE HCL 0.3 MG PO TABS: 0.3000 mg | ORAL_TABLET | Freq: Every day | ORAL | 0 refills | Status: DC

## 2023-09-29 ENCOUNTER — Ambulatory Visit: Payer: BC Managed Care – PPO | Admitting: Cardiology

## 2023-09-30 ENCOUNTER — Ambulatory Visit: Payer: BC Managed Care – PPO | Admitting: Family Medicine

## 2023-09-30 ENCOUNTER — Encounter: Payer: Self-pay | Admitting: Family Medicine

## 2023-09-30 VITALS — BP 141/100 | HR 88 | Ht 63.0 in | Wt 301.1 lb

## 2023-09-30 DIAGNOSIS — R7303 Prediabetes: Secondary | ICD-10-CM | POA: Diagnosis not present

## 2023-09-30 DIAGNOSIS — M5442 Lumbago with sciatica, left side: Secondary | ICD-10-CM | POA: Diagnosis not present

## 2023-09-30 DIAGNOSIS — Z23 Encounter for immunization: Secondary | ICD-10-CM

## 2023-09-30 DIAGNOSIS — E785 Hyperlipidemia, unspecified: Secondary | ICD-10-CM

## 2023-09-30 DIAGNOSIS — E559 Vitamin D deficiency, unspecified: Secondary | ICD-10-CM

## 2023-09-30 DIAGNOSIS — I10 Essential (primary) hypertension: Secondary | ICD-10-CM | POA: Diagnosis not present

## 2023-09-30 MED ORDER — MELOXICAM 15 MG PO TABS: ORAL_TABLET | ORAL | 0 refills | Status: DC

## 2023-09-30 MED ORDER — TRIAMTERENE-HCTZ 75-50 MG PO TABS: 1.0000 | ORAL_TABLET | Freq: Every day | ORAL | 3 refills | Status: DC

## 2023-09-30 MED ORDER — VALSARTAN 80 MG PO TABS: 80.0000 mg | ORAL_TABLET | Freq: Every day | ORAL | 1 refills | Status: DC

## 2023-09-30 MED ORDER — LABETALOL HCL 200 MG PO TABS: 200.0000 mg | ORAL_TABLET | Freq: Two times a day (BID) | ORAL | 3 refills | Status: DC

## 2023-09-30 MED ORDER — CLONIDINE HCL 0.3 MG PO TABS: 0.3000 mg | ORAL_TABLET | Freq: Every day | ORAL | 3 refills | Status: DC

## 2023-09-30 MED ORDER — AMLODIPINE BESYLATE 10 MG PO TABS: 10.0000 mg | ORAL_TABLET | Freq: Every day | ORAL | 3 refills | Status: DC

## 2023-09-30 NOTE — Patient Instructions (Addendum)
Annual with pap in 3 to 4  months, call if you neeed me sooner   Fasting lipid, cmp and EGFR, HBA1C, TSH and vit D 3rd week in January  Check re ins coverage for Zepbound or wegovy  Flu vaccine today  Thanks for choosing [facility], we consider it a privelige to serve you.

## 2023-10-06 DIAGNOSIS — G4733 Obstructive sleep apnea (adult) (pediatric): Secondary | ICD-10-CM | POA: Diagnosis not present

## 2023-10-12 DIAGNOSIS — Z23 Encounter for immunization: Secondary | ICD-10-CM | POA: Insufficient documentation

## 2023-10-12 NOTE — Assessment & Plan Note (Signed)
Elevated at visit , states " late in taking meds this morning DASH diet and commitment to daily physical activity for a minimum of 30 minutes discussed and encouraged, as a part of hypertension management. The importance of attaining a healthy weight is also discussed. Managed by [facility] , no changes made     09/30/2023   10:57 AM 09/30/2023   10:55 AM 07/15/2023    6:40 PM 07/07/2023    7:32 PM 06/30/2023   11:30 AM 06/23/2023   11:30 AM 06/18/2023    9:22 AM  BP/Weight  Systolic BP 141 156 118    142  Diastolic BP 100 107 78    104  Wt. (Lbs)  301.08 299.4 295 294 290 301  BMI  53.33 kg/m2 53.04 kg/m2 52.26 kg/m2 52.08 kg/m2 51.37 kg/m2 53.32 kg/m2      .

## 2023-10-12 NOTE — Assessment & Plan Note (Signed)
  Patient re-educated about  the importance of commitment to a  minimum of 150 minutes of exercise per week as able.  The importance of healthy food choices with portion control discussed, as well as eating regularly and within a 12 hour window most days. The need to choose "clean , green" food 50 to 75% of the time is discussed, as well as to make water the primary drink and set a goal of 64 ounces water daily.       09/30/2023   10:55 AM 07/15/2023    6:40 PM 07/07/2023    7:32 PM  Weight /BMI  Weight 301 lb 1.3 oz 299 lb 6.4 oz 295 lb  Height 5\' 3"  (1.6 m)    BMI 53.33 kg/m2 53.04 kg/m2 52.26 kg/m2    Will check on coverage for wegovy and/ or  zepbound

## 2023-10-12 NOTE — Assessment & Plan Note (Addendum)
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

## 2023-10-12 NOTE — Assessment & Plan Note (Signed)
Patient educated about the importance of limiting  Carbohydrate intake , the need to commit to daily physical activity for a minimum of 30 minutes , and to commit weight loss. The fact that changes in all these areas will reduce or eliminate all together the development of diabetes is stressed.  Updated lab needed at/ before next visit.     Latest Ref Rng & Units 04/08/2023   10:35 AM 10/01/2022    8:11 AM 04/07/2022   11:22 AM 04/07/2022   11:18 AM 10/30/2021    7:29 PM  Diabetic Labs  HbA1c 4.8 - 5.6 % 5.8   5.3     Chol 100 - 199 mg/dL 130  865   784    HDL >69 mg/dL 55  52   59    Calc LDL 0 - 99 mg/dL 53  58   629    Triglycerides 0 - 149 mg/dL 75  62   90    Creatinine 0.57 - 1.00 mg/dL 5.28  4.13   2.44  0.10       09/30/2023   10:57 AM 09/30/2023   10:55 AM 07/15/2023    6:40 PM 07/07/2023    7:32 PM 06/30/2023   11:30 AM 06/23/2023   11:30 AM 06/18/2023    9:22 AM  BP/Weight  Systolic BP 141 156 118    142  Diastolic BP 100 107 78    104  Wt. (Lbs)  301.08 299.4 295 294 290 301  BMI  53.33 kg/m2 53.04 kg/m2 52.26 kg/m2 52.08 kg/m2 51.37 kg/m2 53.32 kg/m2       No data to display

## 2023-10-12 NOTE — Progress Notes (Signed)
[name]     MRN: [id number]      DOB: 29-May-1971  Chief Complaint  Patient presents with   Follow-up    Follow up    HPI [name] is here for follow up and re-evaluation of chronic medical conditions, medication management and review of any available recent lab and radiology data.  Preventive health is updated, specifically  Cancer screening and Immunization.   Questions or concerns regarding consultations or procedures which the PT has had in the interim are  addressed. The PT denies any adverse reactions to current medications since the last visit.  There are no new concerns.  Ongoing weight gain , did well on did well on ozempic In the past , ins coverage a challenge will once again try to get medication which she need  ROS Denies recent fever or chills. Denies sinus pressure, nasal congestion, ear pain or sore throat. Denies chest congestion, productive cough or wheezing. Denies chest pains, palpitations and leg swelling Denies abdominal pain, nausea, vomiting,diarrhea or constipation.   Denies dysuria, frequency, hesitancy or incontinence.  Denies depression, anxiety or insomnia. Denies skin break down or rash.   PE  BP (!) 141/100 (BP Location: Left Arm, Patient Position: Sitting, Cuff Size: Large)   Pulse 88   Ht 5\' 3"  (1.6 m)   Wt (!) 301 lb 1.3 oz (136.6 kg)   SpO2 94%   BMI 53.33 kg/m   Patient alert and oriented and in no cardiopulmonary distress.  HEENT: No facial asymmetry, EOMI,     Neck supple .  Chest: Clear to auscultation bilaterally.  CVS: S1, S2 no murmurs, no S3.Regular rate.  ABD: Soft non tender.   Ext: No edema  MS: Adequate though reduced  ROM spine, shoulders, hips and knees.  Skin: Intact, no ulcerations or rash noted.  Psych: Good eye contact, normal affect. Memory intact not anxious or depressed appearing.  CNS: CN 2-12 intact, power,  normal throughout.no focal deficits noted.   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   Hyperlipidemia LDL goal <70 Hyperlipidemia:Low fat diet discussed and encouraged.   Lipid Panel  Lab Results  Component Value Date   CHOL 123 04/08/2023   HDL 55 04/08/2023   LDLCALC 53 04/08/2023   TRIG 75 04/08/2023   CHOLHDL 2.2 04/08/2023     Not at goal no change in medication Updated lab needed at/ before next visit.   Morbid obesity  Patient re-educated about  the importance of commitment to a  minimum of 150 minutes of exercise per week as able.  The importance of healthy food choices with portion control discussed, as well as eating regularly and within a 12 hour window most days. The need to choose "clean , green" food 50 to 75% of the time is discussed, as well as to make water the primary drink and set a goal of 64 ounces water daily.       09/30/2023   10:55 AM 07/15/2023    6:40 PM 07/07/2023    7:32 PM  Weight /BMI  Weight 301 lb 1.3 oz 299  lb 6.4 oz 295 lb  Height 5\' 3"  (1.6 m)    BMI 53.33 kg/m2 53.04 kg/m2 52.26 kg/m2    Will check on coverage for wegovy and/ or  zepbound  Malignant hypertension Elevated at visit , states " late in taking meds this morning DASH diet and commitment to daily physical activity for a minimum of 30 minutes discussed and encouraged, as a part of hypertension management. The importance of attaining a healthy weight is also discussed. Managed by [facility] , no changes made     09/30/2023   10:57 AM 09/30/2023   10:55 AM 07/15/2023    6:40 PM 07/07/2023    7:32 PM 06/30/2023   11:30 AM 06/23/2023   11:30 AM 06/18/2023    9:22 AM  BP/Weight  Systolic BP  141 098 118    142  Diastolic BP 100 107 78    104  Wt. (Lbs)  301.08 299.4 295 294 290 301  BMI  53.33 kg/m2 53.04 kg/m2 52.26 kg/m2 52.08 kg/m2 51.37 kg/m2 53.32 kg/m2      .

## 2023-10-12 NOTE — Assessment & Plan Note (Signed)
After obtaining informed consent, the influenza vaccine is  administered , with no adverse effect noted at the time of administration.

## 2023-10-12 NOTE — Assessment & Plan Note (Addendum)
Hyperlipidemia:Low fat diet discussed and encouraged.   Lipid Panel  Lab Results  Component Value Date   CHOL 123 04/08/2023   HDL 55 04/08/2023   LDLCALC 53 04/08/2023   TRIG 75 04/08/2023   CHOLHDL 2.2 04/08/2023     Not at goal no change in medication Updated lab needed at/ before next visit.

## 2023-10-22 NOTE — Progress Notes (Deleted)
 No chief complaint on file.   History of Present Illness: 53yo female with history of CAD, obesity and HTN who is here today for cardiac follow up. She was admitted to [facility] April 2021 with an anterior STEMI. A drug eluting stent was placed in the LAD. LV function was normal. Echo April with LVEF=55-60%, no valve disease. Her BP is followed in the HTN clinic. Renal artery dopplers in 2024 with no evidence of renal artery stenosis. Sleep study in 2024 with sleep apnea. *** She is now on CPAP.   She is here today for follow up. The patient denies any chest pain, dyspnea, palpitations, lower extremity edema, orthopnea, PND, dizziness, near syncope or syncope.    Primary Care Physician: Antonetta Rollene BRAVO, MD   Past Medical History:  Diagnosis Date   CAD (coronary artery disease), native coronary artery    s/p DES to LAD 02/01/20   Chest pain 01/2020   Facial numbness    Fibroid    Fx ankle    Hypertension    Myocardial infarction (HCC)    Obesity    PONV (postoperative nausea and vomiting)     Past Surgical History:  Procedure Laterality Date   APPENDECTOMY     APPLICATION OF CRANIAL NAVIGATION N/A 08/16/2019   Procedure: APPLICATION OF CRANIAL NAVIGATION;  Surgeon: Cheryle Debby LABOR, MD;  [facility];  Service: Neurosurgery;  Laterality: N/A;   bilateral breast reduction  2004   CHOLECYSTECTOMY     COLONOSCOPY WITH PROPOFOL  N/A 05/07/2021   Procedure: COLONOSCOPY WITH PROPOFOL ;  Surgeon: Eartha Angelia Sieving, MD;  [facility];  Service: Gastroenterology;  Laterality: N/A;  7:30   CORONARY PRESSURE/FFR STUDY N/A 02/01/2020   Procedure: INTRAVASCULAR PRESSURE WIRE/FFR STUDY;  Surgeon: Verlin Lonni BIRCH, MD;  [facility];  Service: Cardiovascular;  Laterality: N/A;   CORONARY STENT INTERVENTION N/A 02/01/2020   Procedure: CORONARY STENT INTERVENTION;  Surgeon: Verlin Lonni BIRCH, MD;  [facility];  Service:  Cardiovascular;  Laterality: N/A;   CRANIOTOMY Left 08/16/2019   Procedure: Left craniotomy for tumor resection;  Surgeon: Cheryle Debby LABOR, MD;  Location: [facility] OR;  Service: Neurosurgery;  Laterality: Left;  Left craniotomy for tumor resection   LEFT HEART CATH AND CORONARY ANGIOGRAPHY N/A 02/01/2020   Procedure: LEFT HEART CATH AND CORONARY ANGIOGRAPHY;  Surgeon: Verlin Lonni BIRCH, MD;  [facility];  Service: Cardiovascular;  Laterality: N/A;   POLYPECTOMY  05/07/2021   Procedure: POLYPECTOMY;  Surgeon: Eartha Angelia, Sieving, MD;  [facility];  Service: Gastroenterology;;   REDUCTION MAMMAPLASTY Bilateral    removal of left ovarian cyst  2003   SALPINGECTOMY     TUBAL LIGATION  2011   Women's, removal of tubes 2013    Current Outpatient Medications  Medication Sig Dispense Refill   acetaminophen  (TYLENOL ) 325 MG tablet Take 325 mg by mouth every 6 (six) hours as needed.     amLODipine  (NORVASC ) 10 MG tablet Take 1 tablet (10 mg total) by mouth daily. 90 tablet 3   aspirin  81 MG chewable tablet Chew 1 tablet (81 mg total) by mouth daily. 90 tablet 1   atorvastatin  (LIPITOR ) 80 MG tablet Take 1 tablet (80 mg total) by mouth daily. 90 tablet 3   cloNIDine  (CATAPRES ) 0.3 MG tablet Take 1 tablet (0.3 mg total) by mouth at bedtime. 90 tablet 3   ergocalciferol  (VITAMIN D2) 1.25 MG (50000 UT) capsule Take 1 capsule (50,000  Units total) by mouth once a week. One capsule once weekly 12 capsule 2   labetalol  (NORMODYNE ) 200 MG tablet Take 1 tablet (200 mg total) by mouth 2 (two) times daily. 180 tablet 3   meloxicam  (MOBIC ) 15 MG tablet Take one tablet by mouth once daily for 5 days, then as needed, for pain 20 tablet 0   triamterene -hydrochlorothiazide  (MAXZIDE ) 75-50 MG tablet Take 1 tablet by mouth daily. 90 tablet 3   valsartan  (DIOVAN ) 80 MG tablet Take 1 tablet (80 mg total) by mouth daily. 90 tablet 1   No current facility-administered medications for  this visit.    Allergies  Allergen Reactions   Lisinopril  Cough    Social History   Socioeconomic History   Marital status: Single    Spouse name: Not on file   Number of children: 4   Years of education: Not on file   Highest education level: Some college, no degree  Occupational History   Not on file  Tobacco Use   Smoking status: Never   Smokeless tobacco: Never  Vaping Use   Vaping status: Never Used  Substance and Sexual Activity   Alcohol use: Yes    Comment: rarely   Drug use: No   Sexual activity: Yes    Birth control/protection: Surgical  Other Topics Concern   Not on file  Social History Narrative   Lives with children   Caffeine- sodas, 48 oz daily   Social Drivers of Health   Financial Resource Strain: Low Risk  (04/08/2023)   Overall Financial Resource Strain (CARDIA)    Difficulty of Paying Living Expenses: Not very hard  Food Insecurity: No Food Insecurity (04/08/2023)   Hunger Vital Sign    Worried About Running Out of Food in the Last Year: Never true    Ran Out of Food in the Last Year: Never true  Transportation Needs: No Transportation Needs (04/08/2023)   PRAPARE - Administrator, Civil Service (Medical): No    Lack of Transportation (Non-Medical): No  Physical Activity: Insufficiently Active (04/08/2023)   Exercise Vital Sign    Days of Exercise per Week: 2 days    Minutes of Exercise per Session: 30 min  Stress: No Stress Concern Present (04/08/2023)   [employer] of Occupational Health - Occupational Stress Questionnaire    Feeling of Stress : Not at all  Social Connections: Moderately Integrated (04/08/2023)   Social Connection and Isolation Panel [NHANES]    Frequency of Communication with Friends and Family: More than three times a week    Frequency of Social Gatherings with Friends and Family: More than three times a week    Attends Religious Services: More than 4 times per year    Active Member of [employer] or  Organizations: Yes    Attends Engineer, Structural: More than 4 times per year    Marital Status: Never married  Catering Manager Violence: Not on file    Family History  Problem Relation Age of Onset   Coronary artery disease Mother    Hypertension Mother    Coronary artery disease Father    Kidney disease Father    Hypertension Father    Multiple sclerosis Sister    Hypertension Sister    Stroke Maternal Grandmother    Heart attack Maternal Grandfather    Anesthesia problems Neg Hx    Hypotension Neg Hx    Malignant hyperthermia Neg Hx    Pseudochol deficiency Neg Hx  Review of Systems:  As stated in the HPI and otherwise negative.   There were no vitals taken for this visit.  Physical Examination: General: Well developed, well nourished, NAD  HEENT: OP clear, mucus membranes moist  SKIN: warm, dry. No rashes. Neuro: No focal deficits  Musculoskeletal: Muscle strength 5/5 all ext  Psychiatric: Mood and affect normal  Neck: No JVD, no carotid bruits, no thyromegaly, no lymphadenopathy.  Lungs:Clear bilaterally, no wheezes, rhonci, crackles Cardiovascular: Regular rate and rhythm. No murmurs, gallops or rubs. Abdomen:Soft. Bowel sounds present. Non-tender.  Extremities: No lower extremity edema. Pulses are 2 + in the bilateral DP/PT.  EKG:  EKG is *** ordered today. The ekg ordered today demonstrates   Recent Labs: 04/08/2023: ALT 31; BUN 15; Creatinine, Ser 0.81; Potassium 3.6; Sodium 143   Lipid Panel    Component Value Date/Time   CHOL 123 04/08/2023 1035   TRIG 75 04/08/2023 1035   HDL 55 04/08/2023 1035   CHOLHDL 2.2 04/08/2023 1035   CHOLHDL 2.9 02/02/2020 0830   VLDL 15 02/02/2020 0830   LDLCALC 53 04/08/2023 1035   LDLCALC 91 11/05/2017 0906     Wt Readings from Last 3 Encounters:  09/30/23 (!) 136.6 kg  07/15/23 135.8 kg  07/07/23 133.8 kg    Assessment and Plan:   1. CAD without angina: No chest pain. Will continue ASA, statin and  beta blocker.    2. HTN: BP is well controlled. Continue current therapy  Labs/ tests ordered today include:  No orders of the defined types were placed in this encounter.  Disposition:   FU with me in 6 months  Signed, Lonni Cash, MD 10/22/2023 6:25 PM    [facility] [address] [place], [place], [place]  [address] Phone: [phone]; Fax: [phone]

## 2023-10-23 ENCOUNTER — Ambulatory Visit: Payer: Self-pay | Admitting: Cardiovascular Disease

## 2023-11-06 DIAGNOSIS — G4733 Obstructive sleep apnea (adult) (pediatric): Secondary | ICD-10-CM | POA: Diagnosis not present

## 2023-11-10 ENCOUNTER — Other Ambulatory Visit (HOSPITAL_BASED_OUTPATIENT_CLINIC_OR_DEPARTMENT_OTHER): Payer: Self-pay

## 2023-11-10 MED ORDER — VALSARTAN 80 MG PO TABS: 80.0000 mg | ORAL_TABLET | Freq: Every day | ORAL | 1 refills | Status: DC

## 2023-11-10 MED ORDER — VALSARTAN 80 MG PO TABS
80.0000 mg | ORAL_TABLET | Freq: Every day | ORAL | 1 refills | Status: DC
  Filled 2023-11-10: qty 90, 90d supply, fill #0

## 2023-11-10 NOTE — Addendum Note (Signed)
Addended by: Shayne Alken on: 11/10/2023 10:21 AM   Modules accepted: Orders

## 2023-11-11 ENCOUNTER — Other Ambulatory Visit: Payer: Self-pay

## 2023-11-11 DIAGNOSIS — M5442 Lumbago with sciatica, left side: Secondary | ICD-10-CM

## 2023-11-11 MED ORDER — TRIAMTERENE-HCTZ 75-50 MG PO TABS: 1.0000 | ORAL_TABLET | Freq: Every day | ORAL | 3 refills | Status: AC

## 2023-11-11 MED ORDER — CLONIDINE HCL 0.3 MG PO TABS: 0.3000 mg | ORAL_TABLET | Freq: Every day | ORAL | 3 refills | Status: AC

## 2023-11-11 MED ORDER — LABETALOL HCL 200 MG PO TABS: 200.0000 mg | ORAL_TABLET | Freq: Two times a day (BID) | ORAL | 3 refills | Status: AC

## 2023-11-11 MED ORDER — AMLODIPINE BESYLATE 10 MG PO TABS: 10.0000 mg | ORAL_TABLET | Freq: Every day | ORAL | 3 refills | Status: AC

## 2023-11-11 MED ORDER — MELOXICAM 15 MG PO TABS: ORAL_TABLET | ORAL | 0 refills | Status: DC

## 2023-11-23 ENCOUNTER — Ambulatory Visit: Payer: BC Managed Care – PPO | Attending: Cardiology | Admitting: Cardiology

## 2023-11-23 ENCOUNTER — Encounter: Payer: Self-pay | Admitting: Cardiology

## 2023-11-23 VITALS — BP 140/82 | HR 87 | Ht 63.0 in | Wt 297.0 lb

## 2023-11-23 DIAGNOSIS — I10 Essential (primary) hypertension: Secondary | ICD-10-CM

## 2023-11-23 DIAGNOSIS — G4733 Obstructive sleep apnea (adult) (pediatric): Secondary | ICD-10-CM

## 2023-11-23 NOTE — Progress Notes (Signed)
 Sleep Medicine CONSULT Note    Date:  11/23/2023   ID:  [name], DOB 26-Dec-1970, MRN [id number]  PCP:  Towanda Fret, MD  Cardiologist: Antoinette Batman, MD   Chief Complaint  Patient presents with   New Patient (Initial Visit)    Obstructive sleep apnea    History of Present Illness:  [name] is a 53 y.o. female who is being seen today for the evaluation of obstructive sleep apnea at the request of Antoinette Batman, MD.  This is a 53 year old female with a history of CAD, hypertension and obesity.  She was seen by Neomi Banks, NP a year ago and stated that her daughter was complaining that she snored and feels tired during the day to the point she has to take a nap.  She goes to bed at 10 PM and gets up at 4 AM to go to work.  She underwent home sleep study which demonstrated severe obstructive sleep apnea with an AHI of 30.2/h and no significant central events.  She also had nocturnal hypoxemia with O2 saturations less than 88% for 104.8 minutes.  She underwent CPAP titration and was titrated to CPAP at 11 cm H2O.  She is now referred for sleep medicine consultation to establish sleep care and treatment of obstructive sleep apnea  She is doing well with her PAP device and thinks that she has gotten used to it.  She tolerates the mask and feels the pressure is adequate.  Since going on PAP she feels rested in the am and has no significant daytime sleepiness.  She denies any significant mouth or nasal dryness or nasal congestion.  She does not think that he snores.    Past Medical History:  Diagnosis Date   CAD (coronary artery disease), native coronary artery    s/p DES to LAD 02/01/20   Chest pain 01/2020   Facial numbness    Fibroid    Fx ankle    Hypertension    Myocardial infarction (HCC)    Obesity    OSA on CPAP    severe obstructive sleep apnea with an AHI of 30.2/h with nocturnal hypoxemia now on CPAP at 11cm H2O   PONV (postoperative nausea and  vomiting)     Past Surgical History:  Procedure Laterality Date   APPENDECTOMY     APPLICATION OF CRANIAL NAVIGATION N/A 08/16/2019   Procedure: APPLICATION OF CRANIAL NAVIGATION;  Surgeon: Cannon Champion, MD;  [facility];  Service: Neurosurgery;  Laterality: N/A;   bilateral breast reduction  2004   CHOLECYSTECTOMY     COLONOSCOPY WITH PROPOFOL  N/A 05/07/2021   Procedure: COLONOSCOPY WITH PROPOFOL ;  Surgeon: Urban Garden, MD;  [facility];  Service: Gastroenterology;  Laterality: N/A;  7:30   CORONARY PRESSURE/FFR STUDY N/A 02/01/2020   Procedure: INTRAVASCULAR PRESSURE WIRE/FFR STUDY;  Surgeon: Odie Benne, MD;  [facility];  Service: Cardiovascular;  Laterality: N/A;   CORONARY STENT INTERVENTION N/A 02/01/2020   Procedure: CORONARY STENT INTERVENTION;  Surgeon: Odie Benne, MD;  [facility];  Service: Cardiovascular;  Laterality: N/A;   CRANIOTOMY Left 08/16/2019   Procedure: Left craniotomy for tumor resection;  Surgeon: Cannon Champion, MD;  Location: [facility] OR;  Service: Neurosurgery;  Laterality: Left;  Left craniotomy for tumor resection   LEFT HEART CATH AND CORONARY ANGIOGRAPHY N/A 02/01/2020   Procedure: LEFT HEART CATH AND CORONARY ANGIOGRAPHY;  Surgeon: Antoinette Batman  D, MD;  [facility];  Service: Cardiovascular;  Laterality: N/A;   POLYPECTOMY  05/07/2021   Procedure: POLYPECTOMY;  Surgeon: Umberto Ganong, Bearl Limes, MD;  [facility];  Service: Gastroenterology;;   REDUCTION MAMMAPLASTY Bilateral    removal of left ovarian cyst  2003   SALPINGECTOMY     TUBAL LIGATION  2011   Women's, removal of tubes 2013    Current Medications: Current Meds  Medication Sig   acetaminophen  (TYLENOL ) 325 MG tablet Take 325 mg by mouth every 6 (six) hours as needed.   amLODipine  (NORVASC ) 10 MG tablet Take 1 tablet (10 mg total) by mouth daily.   aspirin  81 MG  chewable tablet Chew 1 tablet (81 mg total) by mouth daily.   atorvastatin  (LIPITOR ) 80 MG tablet Take 1 tablet (80 mg total) by mouth daily.   cloNIDine  (CATAPRES ) 0.3 MG tablet Take 1 tablet (0.3 mg total) by mouth at bedtime.   ergocalciferol  (VITAMIN D2) 1.25 MG (50000 UT) capsule Take 1 capsule (50,000 Units total) by mouth once a week. One capsule once weekly   labetalol  (NORMODYNE ) 200 MG tablet Take 1 tablet (200 mg total) by mouth 2 (two) times daily.   meloxicam  (MOBIC ) 15 MG tablet Take one tablet by mouth once daily for 5 days, then as needed, for pain   triamterene -hydrochlorothiazide  (MAXZIDE ) 75-50 MG tablet Take 1 tablet by mouth daily.   valsartan  (DIOVAN ) 80 MG tablet Take 1 tablet (80 mg total) by mouth daily.    Allergies:   Lisinopril    Social History   Socioeconomic History   Marital status: Single    Spouse name: Not on file   Number of children: 4   Years of education: Not on file   Highest education level: Some college, no degree  Occupational History   Not on file  Tobacco Use   Smoking status: Never   Smokeless tobacco: Never  Vaping Use   Vaping status: Never Used  Substance and Sexual Activity   Alcohol use: Yes    Comment: rarely   Drug use: No   Sexual activity: Yes    Birth control/protection: Surgical  Other Topics Concern   Not on file  Social History Narrative   Lives with children   Caffeine- sodas, 48 oz daily   Social Drivers of Health   Financial Resource Strain: Low Risk  (04/08/2023)   Overall Financial Resource Strain (CARDIA)    Difficulty of Paying Living Expenses: Not very hard  Food Insecurity: No Food Insecurity (04/08/2023)   Hunger Vital Sign    Worried About Running Out of Food in the Last Year: Never true    Ran Out of Food in the Last Year: Never true  Transportation Needs: No Transportation Needs (04/08/2023)   PRAPARE - Administrator, Civil Service (Medical): No    Lack of Transportation (Non-Medical):  No  Physical Activity: Insufficiently Active (04/08/2023)   Exercise Vital Sign    Days of Exercise per Week: 2 days    Minutes of Exercise per Session: 30 min  Stress: No Stress Concern Present (04/08/2023)   [employer] of Occupational Health - Occupational Stress Questionnaire    Feeling of Stress : Not at all  Social Connections: Moderately Integrated (04/08/2023)   Social Connection and Isolation Panel [NHANES]    Frequency of Communication with Friends and Family: More than three times a week    Frequency of Social Gatherings with Friends and Family: More than  three times a week    Attends Religious Services: More than 4 times per year    Active Member of Clubs or Organizations: Yes    Attends Engineer, structural: More than 4 times per year    Marital Status: Never married     Family History:  The patient's family history includes Coronary artery disease in her father and mother; Heart attack in her maternal grandfather; Hypertension in her father, mother, and sister; Kidney disease in her father; Multiple sclerosis in her sister; Stroke in her maternal grandmother.   ROS:   Please see the history of present illness.    ROS All other systems reviewed and are negative.      No data to display             PHYSICAL EXAM:   VS:  BP (!) 140/82   Pulse 87   Ht 5\' 3"  (1.6 m)   Wt 297 lb (134.7 kg)   SpO2 96%   BMI 52.61 kg/m    GEN: Well nourished, well developed, in no acute distress  HEENT: normal  Neck: no JVD, carotid bruits, or masses Cardiac: RRR; no murmurs, rubs, or gallops,no edema.  Intact distal pulses bilaterally.  Respiratory:  clear to auscultation bilaterally, normal work of breathing GI: soft, nontender, nondistended, + BS MS: no deformity or atrophy  Skin: warm and dry, no rash Neuro:  Alert and Oriented x 3, Strength and sensation are intact Psych: euthymic mood, full affect  Wt Readings from Last 3 Encounters:  11/23/23 297 lb  (134.7 kg)  09/30/23 (!) 301 lb 1.3 oz (136.6 kg)  07/15/23 299 lb 6.4 oz (135.8 kg)      Studies/Labs Reviewed:   Home sleep study, CPAP titration, PAP compliance download  Recent Labs: 04/08/2023: ALT 31; BUN 15; Creatinine, Ser 0.81; Potassium 3.6; Sodium 143     ASSESSMENT:    1. OSA on CPAP   2. Essential hypertension      PLAN:  In order of problems listed above:  OSA - The patient is tolerating PAP therapy well without any problems. The PAP download performed by his DME was personally reviewed and interpreted by me today and showed an AHI of 1.0 /hr on 10 cm H2O with 20% compliance in using more than 4 hours nightly.  The patient has been using and benefiting from PAP use and will continue to benefit from therapy.  -I encouraged her to be more compliant with her device>>she had not been using a lot when her mom was very sick with meningitis and was hospitalized  Hypertension -BP controlled on exam today -Continue prescription drug management with amlodipine  10 mg daily, clonidine  0.3 mg nightly, labetalol  200 mg twice daily and Maxide 75-50 mg daily and valsartan  80 mg daily with as needed refills   Time Spent: 20 minutes total time of encounter, including 15 minutes spent in face-to-face patient care on the date of this encounter. This time includes coordination of care and counseling regarding above mentioned problem list. Remainder of non-face-to-face time involved reviewing chart documents/testing relevant to the patient encounter and documentation in the medical record. I have independently reviewed documentation from referring provider  Medication Adjustments/Labs and Tests Ordered: Current medicines are reviewed at length with the patient today.  Concerns regarding medicines are outlined above.  Medication changes, Labs and Tests ordered today are listed in the Patient Instructions below.  There are no Patient Instructions on file for this  visit.  Signed, Gaylyn Keas, MD  11/23/2023 9:02 AM    [facility] [address] [place], [place], [place]  [address] Phone: [phone]; Fax: [phone]

## 2023-11-23 NOTE — Patient Instructions (Signed)
 Medication Instructions:  Your physician recommends that you continue on your current medications as directed. Please refer to the Current Medication list given to you today.  *If you need a refill on your cardiac medications before your next appointment, please call your pharmacy*   Lab Work: None.  If you have labs (blood work) drawn today and your tests are completely normal, you will receive your results only by: MyChart Message (if you have MyChart) OR A paper copy in the mail If you have any lab test that is abnormal or we need to change your treatment, we will call you to review the results.   Testing/Procedures: None.   Follow-Up:  Your next appointment:   1 year(s)  Provider:   Dr. Armanda Magic, MD

## 2023-12-07 ENCOUNTER — Ambulatory Visit (HOSPITAL_COMMUNITY): Payer: BC Managed Care – PPO

## 2023-12-07 DIAGNOSIS — G4733 Obstructive sleep apnea (adult) (pediatric): Secondary | ICD-10-CM | POA: Diagnosis not present

## 2023-12-18 ENCOUNTER — Ambulatory Visit: Payer: BC Managed Care – PPO | Admitting: Cardiovascular Disease

## 2023-12-18 NOTE — Progress Notes (Deleted)
 No chief complaint on file.   History of Present Illness: 53 yo female with history of CAD, obesity, HLD and HTN who is here today for cardiac follow up. She was admitted to [facility] April 2021 with an anterior STEMI. A drug eluting stent was placed in the LAD. LV function was normal. Echo April with LVEF=55-60%, no valve disease. Renal artery dopplers February 2024 with no evidence of renal artery stenosis. Diagnosed with sleep apnea in 2024 and now on CPAP. She has been followed in the HTN clinic. Coreg and clonidine stopped. She did not tolerate higher dose of Norvasc due to edema.   She is here today for follow up. The patient denies any chest pain, dyspnea, palpitations, lower extremity edema, orthopnea, PND, dizziness, near syncope or syncope.   Primary Care Physician: Kerri Perches, MD  Past Medical History:  Diagnosis Date   CAD (coronary artery disease), native coronary artery    s/p DES to LAD 02/01/20   Chest pain 01/2020   Facial numbness    Fibroid    Fx ankle    Hypertension    Myocardial infarction (HCC)    Obesity    OSA on CPAP    severe obstructive sleep apnea with an AHI of 30.2/h with nocturnal hypoxemia now on CPAP at 11cm H2O   PONV (postoperative nausea and vomiting)     Past Surgical History:  Procedure Laterality Date   APPENDECTOMY     APPLICATION OF CRANIAL NAVIGATION N/A 08/16/2019   Procedure: APPLICATION OF CRANIAL NAVIGATION;  Surgeon: Jadene Pierini, MD;  Location: [facility] OR;  Service: Neurosurgery;  Laterality: N/A;   bilateral breast reduction  2004   CHOLECYSTECTOMY     COLONOSCOPY WITH PROPOFOL N/A 05/07/2021   Procedure: COLONOSCOPY WITH PROPOFOL;  Surgeon: Dolores Frame, MD;  Location: [facility] ENDO SUITE;  Service: Gastroenterology;  Laterality: N/A;  7:30   CORONARY PRESSURE/FFR STUDY N/A 02/01/2020   Procedure: INTRAVASCULAR PRESSURE WIRE/FFR STUDY;  Surgeon: Kathleene Hazel, MD;  Location: [facility] INVASIVE CV LAB;  Service:  Cardiovascular;  Laterality: N/A;   CORONARY STENT INTERVENTION N/A 02/01/2020   Procedure: CORONARY STENT INTERVENTION;  Surgeon: Kathleene Hazel, MD;  Location: [facility] INVASIVE CV LAB;  Service: Cardiovascular;  Laterality: N/A;   CRANIOTOMY Left 08/16/2019   Procedure: Left craniotomy for tumor resection;  Surgeon: Jadene Pierini, MD;  Location: [facility] OR;  Service: Neurosurgery;  Laterality: Left;  Left craniotomy for tumor resection   LEFT HEART CATH AND CORONARY ANGIOGRAPHY N/A 02/01/2020   Procedure: LEFT HEART CATH AND CORONARY ANGIOGRAPHY;  Surgeon: Kathleene Hazel, MD;  Location: [facility] INVASIVE CV LAB;  Service: Cardiovascular;  Laterality: N/A;   POLYPECTOMY  05/07/2021   Procedure: POLYPECTOMY;  Surgeon: Marguerita Merles, Reuel Boom, MD;  Location: [facility] END[facility] SU[facility];  Service: Gastroenterology;;   REDUCTION MAMMAPLASTY Bilateral    removal of left ovarian cyst  2003   SALPINGECTOMY     TUBAL LIGATION  2011   Women's, removal of tubes 2013    Current Outpatient Medications  Medication Sig Dispense Refill   acetaminophen (TYLENOL) 325 MG tablet Take 325 mg by mouth every 6 (six) hours as needed.     amLODipine (NORVASC) 10 MG tablet Take 1 tablet (10 mg total) by mouth daily. 90 tablet 3   aspirin 81 MG chewable tablet Chew 1 tablet (81 mg total) by mouth daily. 90 tablet 1   atorvastatin (LIPITOR) 80 MG tablet Take 1 tablet (80 mg total) by mouth  daily. 90 tablet 3   cloNIDine (CATAPRES) 0.3 MG tablet Take 1 tablet (0.3 mg total) by mouth at bedtime. 90 tablet 3   ergocalciferol (VITAMIN D2) 1.25 MG (50000 UT) capsule Take 1 capsule (50,000 Units total) by mouth once a week. One capsule once weekly 12 capsule 2   labetalol (NORMODYNE) 200 MG tablet Take 1 tablet (200 mg total) by mouth 2 (two) times daily. 180 tablet 3   meloxicam (MOBIC) 15 MG tablet Take one tablet by mouth once daily for 5 days, then as needed, for pain 20 tablet 0   triamterene-hydrochlorothiazide (MAXZIDE)  75-50 MG tablet Take 1 tablet by mouth daily. 90 tablet 3   valsartan (DIOVAN) 80 MG tablet Take 1 tablet (80 mg total) by mouth daily. 90 tablet 1   No current facility-administered medications for this visit.    Allergies  Allergen Reactions   Lisinopril Cough    Social History   Socioeconomic History   Marital status: Single    Spouse name: Not on file   Number of children: 4   Years of education: Not on file   Highest education level: Some college, no degree  Occupational History   Not on file  Tobacco Use   Smoking status: Never   Smokeless tobacco: Never  Vaping Use   Vaping status: Never Used  Substance and Sexual Activity   Alcohol use: Yes    Comment: rarely   Drug use: No   Sexual activity: Yes    Birth control/protection: Surgical  Other Topics Concern   Not on file  Social History Narrative   Lives with children   Caffeine- sodas, 48 oz daily   Social Drivers of Health   Financial Resource Strain: Low Risk  (04/08/2023)   Overall Financial Resource Strain (CARDIA)    Difficulty of Paying Living Expenses: Not very hard  Food Insecurity: No Food Insecurity (04/08/2023)   Hunger Vital Sign    Worried About Running Out of Food in the Last Year: Never true    Ran Out of Food in the Last Year: Never true  Transportation Needs: No Transportation Needs (04/08/2023)   PRAPARE - Administrator, Civil Service (Medical): No    Lack of Transportation (Non-Medical): No  Physical Activity: Insufficiently Active (04/08/2023)   Exercise Vital Sign    Days of Exercise per Week: 2 days    Minutes of Exercise per Session: 30 min  Stress: No Stress Concern Present (04/08/2023)   [employer] of Occupational Health - Occupational Stress Questionnaire    Feeling of Stress : Not at all  Social Connections: Moderately Integrated (04/08/2023)   Social Connection and Isolation Panel [NHANES]    Frequency of Communication with Friends and Family: More than three  times a week    Frequency of Social Gatherings with Friends and Family: More than three times a week    Attends Religious Services: More than 4 times per year    Active Member of [employer] or Organizations: Yes    Attends Engineer, structural: More than 4 times per year    Marital Status: Never married  Catering manager Violence: Not on file    Family History  Problem Relation Age of Onset   Coronary artery disease Mother    Hypertension Mother    Coronary artery disease Father    Kidney disease Father    Hypertension Father    Multiple sclerosis Sister    Hypertension Sister    Stroke Maternal  Grandmother    Heart attack Maternal Grandfather    Anesthesia problems Neg Hx    Hypotension Neg Hx    Malignant hyperthermia Neg Hx    Pseudochol deficiency Neg Hx     Review of Systems:  As stated in the HPI and otherwise negative.   There were no vitals taken for this visit.  Physical Examination: General: Well developed, well nourished, NAD  HEENT: OP clear, mucus membranes moist  SKIN: warm, dry. No rashes. Neuro: No focal deficits  Musculoskeletal: Muscle strength 5/5 all ext  Psychiatric: Mood and affect normal  Neck: No JVD, no carotid bruits, no thyromegaly, no lymphadenopathy.  Lungs:Clear bilaterally, no wheezes, rhonci, crackles Cardiovascular: Regular rate and rhythm. No murmurs, gallops or rubs. Abdomen:Soft. Bowel sounds present. Non-tender.  Extremities: No lower extremity edema. Pulses are 2 + in the bilateral DP/PT.  EKG:  EKG is not *** ordered today. The ekg ordered today demonstrates    Recent Labs: 04/08/2023: ALT 31; BUN 15; Creatinine, Ser 0.81; Potassium 3.6; Sodium 143   Lipid Panel    Component Value Date/Time   CHOL 123 04/08/2023 1035   TRIG 75 04/08/2023 1035   HDL 55 04/08/2023 1035   CHOLHDL 2.2 04/08/2023 1035   CHOLHDL 2.9 02/02/2020 0830   VLDL 15 02/02/2020 0830   LDLCALC 53 04/08/2023 1035   LDLCALC 91 11/05/2017 0906      Wt Readings from Last 3 Encounters:  11/23/23 134.7 kg  09/30/23 (!) 136.6 kg  07/15/23 135.8 kg    Assessment and Plan:   1. CAD without angina: No chest pain. Continue ASA, statin and beta blocker.   2. HTN: BP is well controlled. Continue Diovan, Maxzide and Norvasc.    3. Hyperlipidemia: LDL ***. Continue statin  4. Sleep apnea: *** ? CPAP  Current medicines are reviewed at length with the patient today.  The patient does not have concerns regarding medicines.  The following changes have been made:  no change  Labs/ tests ordered today include:  No orders of the defined types were placed in this encounter.   Disposition:   FU with me in 6 months  Signed, Verne Carrow, MD 12/18/2023 6:12 AM    [facility] [address] [place], [place], [place]  [address] Phone: [phone]; Fax: [phone]

## 2023-12-30 ENCOUNTER — Ambulatory Visit: Payer: BC Managed Care – PPO | Admitting: Family Medicine

## 2024-01-04 DIAGNOSIS — G4733 Obstructive sleep apnea (adult) (pediatric): Secondary | ICD-10-CM | POA: Diagnosis not present

## 2024-01-12 ENCOUNTER — Other Ambulatory Visit: Payer: Self-pay | Admitting: Family Medicine

## 2024-02-02 NOTE — Progress Notes (Signed)
 Cardiology Office Note:  .   Date:  02/15/2024  ID:  [name], DOB 1970-11-05, MRN [id number] PCP: Towanda Fret, MD  [facility] Providers Cardiologist:  Antoinette Batman, MD    History of Present Illness: .   [name] is a 53 y.o. female  with history of CAD status post anterior STEMI 01/2020 treated with DES to the LAD, normal LVEF on echo, hypertension-followed in HTN clinic, OSA on CPAP, obesity.  Patient comes in for f/u. Denies chest pain, dyspnea, palpitations, edema. Drinks 3 cans soda daily, trying to cut back. 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 Doesn't eat their food. BP doing well. Was going to the gym until Jan when she had to start taking care of her mom. She had bacterial meningitis and is home with a trach.    ROS:    Studies Reviewed: Aaron Aas         Prior CV Studies:   Renal duplex 2024 Summary:  Largest Aortic Diameter: 2.8 cm    Renal:    Right: Normal size right kidney. Normal right Resisitive Index.         Normal cortical thickness of right kidney. No evidence of         right renal artery stenosis. RRV flow present.  Left:  Normal size of left kidney. Normal left Resistive Index.         Normal cortical thickness of the left kidney. No evidence of         left renal artery stenosis. LRV flow present.  Mesenteric:  Normal Celiac artery and Superior Mesenteric artery findings.  Patent IVC.      Echo 02/01/20  1. Left ventricular ejection fraction, by estimation, is 55 to 60%. The  left ventricle has normal function. The left ventricle has no regional  wall motion abnormalities. There is moderate left ventricular hypertrophy.  Left ventricular diastolic  parameters are consistent with Grade I diastolic dysfunction (impaired  relaxation).   2. Right ventricular systolic function is normal. The right ventricular  size is normal. Tricuspid regurgitation signal is inadequate for assessing  PA pressure.   3.  The mitral valve is grossly normal. No evidence of mitral valve  regurgitation. No evidence of mitral stenosis.   4. The aortic valve is tricuspid. Aortic valve regurgitation is not  visualized. No aortic stenosis is present.   5. The inferior vena cava is normal in size with greater than 50%  respiratory variability, suggesting right atrial pressure of 3 mmHg.    LHC 02/02/20 Mid LAD lesion is 80% stenosed. A drug-eluting stent was successfully placed using a STENT RESOLUTE ONYX 3.5X18. Post intervention, there is a 0% residual stenosis. The left ventricular systolic function is normal. LV end diastolic pressure is normal. The left ventricular ejection fraction is 55-65% by visual estimate. There is no mitral valve regurgitation.   1. Severe stenosis mid LAD. Flow limiting by DFR 0.86.  2. Successful PTCA/DES x 1 mid LAD 3. No obstructive disease in the RCA or Circumflex 4. Normal LV systolic function   Recommendations: Continue DAPT for one year with ASA and Brilinta . Will start high intensity statin. Continue beta blocker. Echo later today.     Risk Assessment/Calculations:         STOP-Bang Score:         Physical Exam:   VS:  BP 124/86 (BP Location: Left Arm, Patient Position: Sitting, Cuff Size: Large)  Pulse 80   Ht 5\' 3"  (1.6 m)   SpO2 96%   BMI 52.61 kg/m    Wt Readings from Last 3 Encounters:  11/23/23 297 lb (134.7 kg)  09/30/23 (!) 301 lb 1.3 oz (136.6 kg)  07/15/23 299 lb 6.4 oz (135.8 kg)    GEN:  Obese, in no acute distress NECK: No JVD; No carotid bruits CARDIAC:   RESPIRATORY:  Clear to auscultation without rales, wheezing or rhonchi  ABDOMEN: Soft, non-tender, non-distended EXTREMITIES:  No edema; No deformity   ASSESSMENT AND PLAN: .    CAD status post anterior STEMI 01/2020 treated with DES to the LAD, normal LVEF on echo.no angina. Recommend 150 min exercise a week. Labs to be done by PCP later this month. Continue lipitor  and ASA  Resistant HTN  followed in advanced HTN clinic-excellent control on amlodipine , catapres ,normodyne , valsartan , maxide  OSA on CPAP-compliant  Obesity-has lost 8 lbs since Dec. Continued weight loss encouraged  HLD-LDL 53 last year-due for repeat labs later this month.        Dispo: f/u in 1 yr.  Signed, Theotis Flake, PA-C

## 2024-02-04 DIAGNOSIS — G4733 Obstructive sleep apnea (adult) (pediatric): Secondary | ICD-10-CM | POA: Diagnosis not present

## 2024-02-15 ENCOUNTER — Encounter: Payer: Self-pay | Admitting: Physician Assistant

## 2024-02-15 ENCOUNTER — Ambulatory Visit: Attending: Physician Assistant | Admitting: Physician Assistant

## 2024-02-15 VITALS — BP 124/86 | HR 80 | Ht 63.0 in | Wt 293.8 lb

## 2024-02-15 DIAGNOSIS — E785 Hyperlipidemia, unspecified: Secondary | ICD-10-CM

## 2024-02-15 DIAGNOSIS — G4733 Obstructive sleep apnea (adult) (pediatric): Secondary | ICD-10-CM | POA: Diagnosis not present

## 2024-02-15 DIAGNOSIS — I10 Essential (primary) hypertension: Secondary | ICD-10-CM

## 2024-02-15 DIAGNOSIS — I25118 Atherosclerotic heart disease of native coronary artery with other forms of angina pectoris: Secondary | ICD-10-CM

## 2024-02-15 NOTE — Patient Instructions (Signed)

## 2024-03-01 ENCOUNTER — Ambulatory Visit: Admitting: Family Medicine

## 2024-03-02 ENCOUNTER — Other Ambulatory Visit: Payer: Self-pay | Admitting: Family Medicine

## 2024-03-02 DIAGNOSIS — M5442 Lumbago with sciatica, left side: Secondary | ICD-10-CM

## 2024-03-05 DIAGNOSIS — G4733 Obstructive sleep apnea (adult) (pediatric): Secondary | ICD-10-CM | POA: Diagnosis not present

## 2024-03-29 ENCOUNTER — Encounter: Payer: Self-pay | Admitting: Family Medicine

## 2024-03-29 ENCOUNTER — Ambulatory Visit: Admitting: Family Medicine

## 2024-03-29 VITALS — BP 128/84 | HR 83 | Resp 16 | Ht 63.0 in | Wt 288.1 lb

## 2024-03-29 DIAGNOSIS — R7303 Prediabetes: Secondary | ICD-10-CM

## 2024-03-29 DIAGNOSIS — E559 Vitamin D deficiency, unspecified: Secondary | ICD-10-CM

## 2024-03-29 DIAGNOSIS — Z23 Encounter for immunization: Secondary | ICD-10-CM | POA: Diagnosis not present

## 2024-03-29 DIAGNOSIS — G4733 Obstructive sleep apnea (adult) (pediatric): Secondary | ICD-10-CM

## 2024-03-29 DIAGNOSIS — E785 Hyperlipidemia, unspecified: Secondary | ICD-10-CM | POA: Diagnosis not present

## 2024-03-29 DIAGNOSIS — I10 Essential (primary) hypertension: Secondary | ICD-10-CM | POA: Diagnosis not present

## 2024-03-29 NOTE — Assessment & Plan Note (Signed)
 After obtaining informed consent, the pneumonia 20  vaccine is  administered , with no adverse effect noted at the time of administration.

## 2024-03-29 NOTE — Assessment & Plan Note (Signed)
 Hyperlipidemia:Low fat diet discussed and encouraged.   Lipid Panel  Lab Results  Component Value Date   CHOL 123 04/08/2023   HDL 55 04/08/2023   LDLCALC 53 04/08/2023   TRIG 75 04/08/2023   CHOLHDL 2.2 04/08/2023     Updated lab needed at/ before next visit.

## 2024-03-29 NOTE — Progress Notes (Signed)
 [name]     MRN: [id number]      DOB: 1970/10/24  Chief Complaint  Patient presents with   Hypertension    4 month follow up     HPI [name] is here for follow up and re-evaluation of chronic medical conditions, medication management and review of any available recent lab and radiology data.  Preventive health is updated, specifically  Cancer screening and Immunization.   Interested in bariatric surgery, currently has care of Mother who is recovering from meningitis and still has a trach Not using CPAP as she wants to as she has to hear Mom Modifying diet with some weight loss success ROS Denies recent fever or chills. Denies sinus pressure, nasal congestion, ear pain or sore throat. Denies chest congestion, productive cough or wheezing. Denies chest pains, palpitations and leg swelling Denies abdominal pain, nausea, vomiting,diarrhea or constipation.   Denies dysuria, frequency, hesitancy or incontinence. Denies joint pain, swelling and limitation in mobility. Denies headaches, seizures, numbness, or tingling. Denies depression, anxiety or insomnia. Denies skin break down or rash.   PE  BP 128/84   Pulse 83   Resp 16   Ht 5' 3 (1.6 m)   Wt 288 lb 1.9 oz (130.7 kg)   SpO2 96%   BMI 51.04 kg/m   Patient alert and oriented and in no cardiopulmonary distress.  HEENT: No facial asymmetry, EOMI,     Neck supple .  Chest: Clear to auscultation bilaterally.  CVS: S1, S2 no murmurs, no S3.Regular rate.  ABD: Soft non tender.   Ext: No edema  MS: Adequate ROM spine, shoulders, hips and knees.  Skin: Intact, no ulcerations or rash noted.  Psych: Good eye contact, normal affect. Memory intact not anxious or depressed appearing.  CNS: CN 2-12 intact, power,  normal throughout.no focal deficits noted.   Assessment & Plan  Encounter for immunization After obtaining informed consent, the pneumonia 20  vaccine is  administered , with no adverse effect noted at  the time of administration.   Malignant hypertension Controlled, no change in medication DASH diet and commitment to daily physical activity for a minimum of 30 minutes discussed and encouraged, as a part of hypertension management. The importance of attaining a healthy weight is also discussed.     03/29/2024   11:05 AM 03/29/2024   10:26 AM 02/15/2024    1:02 PM 11/23/2023    8:49 AM 09/30/2023   10:57 AM 09/30/2023   10:55 AM 07/15/2023    6:40 PM  BP/Weight  Systolic BP 128 141 124 140 141 156 118  Diastolic BP 84 93 86 82 100 107 78  Wt. (Lbs)  288.12 293.8 297  301.08 299.4  BMI  51.04 kg/m2 52.04 kg/m2 52.61 kg/m2  53.33 kg/m2 53.04 kg/m2       OSA on CPAP Improved compliance desired and needed, current circumstances mitigate against this however  Prediabetes Patient educated about the importance of limiting  Carbohydrate intake , the need to commit to daily physical activity for a minimum of 30 minutes , and to commit weight loss. The fact that changes in all these areas will reduce or eliminate all together the development of diabetes is stressed.      Latest Ref Rng & Units 04/08/2023   10:35 AM 10/01/2022    8:11 AM 04/07/2022   11:22 AM 04/07/2022   11:18 AM 10/30/2021    7:29 PM  Diabetic Labs  HbA1c 4.8 - 5.6 % 5.8  5.3     Chol 100 - 199 mg/dL 191  478   295    HDL >62 mg/dL 55  52   59    Calc LDL 0 - 99 mg/dL 53  58   130    Triglycerides 0 - 149 mg/dL 75  62   90    Creatinine 0.57 - 1.00 mg/dL 8.65  7.84   6.96  2.95       03/29/2024   11:05 AM 03/29/2024   10:26 AM 02/15/2024    1:02 PM 11/23/2023    8:49 AM 09/30/2023   10:57 AM 09/30/2023   10:55 AM 07/15/2023    6:40 PM  BP/Weight  Systolic BP 128 141 124 140 141 156 118  Diastolic BP 84 93 86 82 100 107 78  Wt. (Lbs)  288.12 293.8 297  301.08 299.4  BMI  51.04 kg/m2 52.04 kg/m2 52.61 kg/m2  53.33 kg/m2 53.04 kg/m2       No data to display          Updated lab needed at/ before next  visit.   Hyperlipidemia LDL goal <70 Hyperlipidemia:Low fat diet discussed and encouraged.   Lipid Panel  Lab Results  Component Value Date   CHOL 123 04/08/2023   HDL 55 04/08/2023   LDLCALC 53 04/08/2023   TRIG 75 04/08/2023   CHOLHDL 2.2 04/08/2023     Updated lab needed at/ before next visit.   Vitamin D  deficiency Updated lab needed at/ before next visit.   Morbid obesity  Patient re-educated about  the importance of commitment to a  minimum of 150 minutes of exercise per week as able.  The importance of healthy food choices with portion control discussed, as well as eating regularly and within a 12 hour window most days. The need to choose clean , green food 50 to 75% of the time is discussed, as well as to make water  the primary drink and set a goal of 64 ounces water  daily.       03/29/2024   10:26 AM 02/15/2024    1:02 PM 11/23/2023    8:49 AM  Weight /BMI  Weight 288 lb 1.9 oz 293 lb 12.8 oz 297 lb  Height 5' 3 (1.6 m) 5' 3 (1.6 m) 5' 3 (1.6 m)  BMI 51.04 kg/m2 52.04 kg/m2 52.61 kg/m2    Improved, refer Bariatric

## 2024-03-29 NOTE — Assessment & Plan Note (Signed)
  Patient re-educated about  the importance of commitment to a  minimum of 150 minutes of exercise per week as able.  The importance of healthy food choices with portion control discussed, as well as eating regularly and within a 12 hour window most days. The need to choose clean , green food 50 to 75% of the time is discussed, as well as to make water  the primary drink and set a goal of 64 ounces water  daily.       03/29/2024   10:26 AM 02/15/2024    1:02 PM 11/23/2023    8:49 AM  Weight /BMI  Weight 288 lb 1.9 oz 293 lb 12.8 oz 297 lb  Height 5' 3 (1.6 m) 5' 3 (1.6 m) 5' 3 (1.6 m)  BMI 51.04 kg/m2 52.04 kg/m2 52.61 kg/m2    Improved, refer Bariatric

## 2024-03-29 NOTE — Assessment & Plan Note (Signed)
 Patient educated about the importance of limiting  Carbohydrate intake , the need to commit to daily physical activity for a minimum of 30 minutes , and to commit weight loss. The fact that changes in all these areas will reduce or eliminate all together the development of diabetes is stressed.      Latest Ref Rng & Units 04/08/2023   10:35 AM 10/01/2022    8:11 AM 04/07/2022   11:22 AM 04/07/2022   11:18 AM 10/30/2021    7:29 PM  Diabetic Labs  HbA1c 4.8 - 5.6 % 5.8   5.3     Chol 100 - 199 mg/dL 528  413   244    HDL >01 mg/dL 55  52   59    Calc LDL 0 - 99 mg/dL 53  58   027    Triglycerides 0 - 149 mg/dL 75  62   90    Creatinine 0.57 - 1.00 mg/dL 2.53  6.64   4.03  4.74       03/29/2024   11:05 AM 03/29/2024   10:26 AM 02/15/2024    1:02 PM 11/23/2023    8:49 AM 09/30/2023   10:57 AM 09/30/2023   10:55 AM 07/15/2023    6:40 PM  BP/Weight  Systolic BP 128 141 124 140 141 156 118  Diastolic BP 84 93 86 82 100 107 78  Wt. (Lbs)  288.12 293.8 297  301.08 299.4  BMI  51.04 kg/m2 52.04 kg/m2 52.61 kg/m2  53.33 kg/m2 53.04 kg/m2       No data to display          Updated lab needed at/ before next visit.

## 2024-03-29 NOTE — Patient Instructions (Addendum)
 F/u in 4 months, call if youi need me sooner  Labs todAY , I WILL SEND MESSAGE RE RESULTS  PNEUMONIA 20 TODAY  PLEASE SCHEDULE MAMMOGRAM AT CHECKOUT  YOU ARE REFERRED TO bARIATRIC SURGERY  CONGRATS ON WEIGHT LOSS  It is important that you exercise regularly at least 30 minutes 5 times a week. If you develop chest pain, have severe difficulty breathing, or feel very tired, stop exercising immediately and seek medical attention   Thanks for choosing [facility], we consider it a privelige to serve you.

## 2024-03-29 NOTE — Assessment & Plan Note (Signed)
 Controlled, no change in medication DASH diet and commitment to daily physical activity for a minimum of 30 minutes discussed and encouraged, as a part of hypertension management. The importance of attaining a healthy weight is also discussed.     03/29/2024   11:05 AM 03/29/2024   10:26 AM 02/15/2024    1:02 PM 11/23/2023    8:49 AM 09/30/2023   10:57 AM 09/30/2023   10:55 AM 07/15/2023    6:40 PM  BP/Weight  Systolic BP 128 141 124 140 141 156 118  Diastolic BP 84 93 86 82 100 107 78  Wt. (Lbs)  288.12 293.8 297  301.08 299.4  BMI  51.04 kg/m2 52.04 kg/m2 52.61 kg/m2  53.33 kg/m2 53.04 kg/m2

## 2024-03-29 NOTE — Assessment & Plan Note (Signed)
 Improved compliance desired and needed, current circumstances mitigate against this however

## 2024-03-29 NOTE — Assessment & Plan Note (Signed)
 Updated lab needed at/ before next visit.

## 2024-03-30 ENCOUNTER — Ambulatory Visit: Payer: Self-pay | Admitting: Family Medicine

## 2024-03-30 ENCOUNTER — Encounter: Payer: Self-pay | Admitting: Family Medicine

## 2024-03-30 DIAGNOSIS — R748 Abnormal levels of other serum enzymes: Secondary | ICD-10-CM

## 2024-03-30 LAB — LIPID PANEL
Chol/HDL Ratio: 2.3 ratio (ref 0.0–4.4)
Cholesterol, Total: 134 mg/dL (ref 100–199)
HDL: 58 mg/dL (ref >39)
LDL Chol Calc (NIH): 59 mg/dL (ref 0–99)
Triglycerides: 88 mg/dL (ref 0–149)
VLDL Cholesterol Cal: 17 mg/dL (ref 5–40)

## 2024-03-30 LAB — CMP14+EGFR
ALT: 26 IU/L (ref 0–32)
AST: 22 IU/L (ref 0–40)
Albumin: 4.2 g/dL (ref 3.8–4.9)
Alkaline Phosphatase: 197 IU/L — ABNORMAL HIGH (ref 44–121)
BUN/Creatinine Ratio: 24 — ABNORMAL HIGH (ref 9–23)
BUN: 17 mg/dL (ref 6–24)
Bilirubin Total: 1.3 mg/dL — ABNORMAL HIGH (ref 0.0–1.2)
CO2: 23 mmol/L (ref 20–29)
Calcium: 9.5 mg/dL (ref 8.7–10.2)
Chloride: 103 mmol/L (ref 96–106)
Creatinine, Ser: 0.71 mg/dL (ref 0.57–1.00)
Globulin, Total: 2.4 g/dL (ref 1.5–4.5)
Glucose: 83 mg/dL (ref 70–99)
Potassium: 4 mmol/L (ref 3.5–5.2)
Sodium: 141 mmol/L (ref 134–144)
Total Protein: 6.6 g/dL (ref 6.0–8.5)
eGFR: 103 mL/min/{1.73_m2} (ref >59)

## 2024-03-30 LAB — TSH: TSH: 1.37 u[IU]/mL (ref 0.450–4.500)

## 2024-03-30 LAB — VITAMIN D 25 HYDROXY (VIT D DEFICIENCY, FRACTURES): Vit D, 25-Hydroxy: 12.5 ng/mL — ABNORMAL LOW (ref 30.0–100.0)

## 2024-03-30 LAB — HEMOGLOBIN A1C
Est. average glucose Bld gHb Est-mCnc: 105 mg/dL
Hgb A1c MFr Bld: 5.3 % (ref 4.8–5.6)

## 2024-03-30 MED ORDER — VITAMIN D (ERGOCALCIFEROL) 1.25 MG (50000 UNIT) PO CAPS: 50000.0000 [IU] | ORAL_CAPSULE | ORAL | 2 refills | Status: AC

## 2024-04-04 ENCOUNTER — Ambulatory Visit (HOSPITAL_COMMUNITY)
Admission: RE | Admit: 2024-04-04 | Discharge: 2024-04-04 | Disposition: A | Discharge status: Home / Self Care | Source: Ambulatory Visit | Attending: Family Medicine | Admitting: Family Medicine

## 2024-04-04 DIAGNOSIS — Z1231 Encounter for screening mammogram for malignant neoplasm of breast: Secondary | ICD-10-CM | POA: Diagnosis not present

## 2024-04-05 ENCOUNTER — Encounter (INDEPENDENT_AMBULATORY_CARE_PROVIDER_SITE_OTHER): Payer: Self-pay | Admitting: *Deleted

## 2024-04-05 DIAGNOSIS — G4733 Obstructive sleep apnea (adult) (pediatric): Secondary | ICD-10-CM | POA: Diagnosis not present

## 2024-04-14 ENCOUNTER — Encounter (INDEPENDENT_AMBULATORY_CARE_PROVIDER_SITE_OTHER): Admitting: Gastroenterology

## 2024-04-18 ENCOUNTER — Encounter (INDEPENDENT_AMBULATORY_CARE_PROVIDER_SITE_OTHER): Payer: Self-pay | Admitting: Gastroenterology

## 2024-04-18 ENCOUNTER — Ambulatory Visit (INDEPENDENT_AMBULATORY_CARE_PROVIDER_SITE_OTHER): Admitting: Gastroenterology

## 2024-04-18 VITALS — BP 120/87 | HR 80 | Temp 98.1°F | Ht 63.0 in | Wt 290.1 lb

## 2024-04-18 DIAGNOSIS — R17 Unspecified jaundice: Secondary | ICD-10-CM | POA: Diagnosis not present

## 2024-04-18 DIAGNOSIS — R748 Abnormal levels of other serum enzymes: Secondary | ICD-10-CM

## 2024-04-18 DIAGNOSIS — R7989 Other specified abnormal findings of blood chemistry: Secondary | ICD-10-CM

## 2024-04-18 NOTE — Patient Instructions (Signed)
Perform blood workup  

## 2024-04-18 NOTE — Progress Notes (Signed)
 Toribio Fortune, M.D. Gastroenterology & Hepatology [facility] Gastroenter[facility] [address] [place], [place] [address]  Primary Care Physician: Antonetta Rollene BRAVO, MD [address] [place] [address]  I will communicate my assessment and recommendations to the referring MD via EMR.  Problems: Elevated LFTs  History of Present Illness: [name] is a 53 y.o. female with PMH brain tumor s/p resection, MI s/p stent placement on DAPT,  who presents for evaluation of elevated bilirubin and alkaline phosphatase.   The patient was last seen on 03/28/2021. At that time, the patient had serologies checked for elevated LFTs and right upper quadrant ultrasound ordered.  Unfortunately, the patient did not perform any of the blood tests that were ordered in her last appointment.  Denies having any complaints. The patient denies having any nausea, vomiting, fever, chills, hematochezia, melena, hematemesis, abdominal distention, abdominal pain, acholia, choluria, diarrhea, jaundice, pruritus or weight loss.  No new medications.  Upon review of the medical record, the patient was found to have mild elevation of her alkaline phosphatase since 2015 when it was 119 but it normalized afterwards, however in 2021 it rose up to 196 and most recently 10/18/2020 he had a value of 185. Her total bilirubin has been less than 1.0 through the years although in 2016 it was 1.3, most recently in 10/18/2020 it went up to 1.4. She also had some mild elevation of her aminotransferases during the last work-up she performed as her AST was 40 and ALT was 77, but previous aminotransferases have been below 30.   Right upper quadrant ultrasound was performed on 04/05/2021 which was normal but only showing status postcholecystectomy.  She had a CT of the abdomen and pelvis with IV contrast on 10/30/2021 which showed small ventral and umbilical hernias but no liver abnormalities.  Most  recent blood workup from 03/29/2024 showed total bilirubin of 1.3, ALT 26, AST 22, alkaline phosphatase 197, normal bili electrolytes and renal function, hemoglobin A1c was 5.3,  Last Colonoscopy: 05/07/21 - Perianal skin tags found on perianal exam. - The examined portion of the ileum was normal. - One 4 mm polyp in the proximal ascending colon, removed with a cold snare. Resected and retrieved. - Diverticulosis in the sigmoid colon, in the descending colon and in the ascending colon. - Non- bleeding internal hemorrhoids.  Pathology showed one tubular adenoma, repeat colonoscopy in 7 years   Past Medical History: Past Medical History:  Diagnosis Date   CAD (coronary artery disease), native coronary artery    s/p DES to LAD 02/01/20   Chest pain 01/2020   Facial numbness    Fibroid    Fx ankle    Hypertension    Myocardial infarction (HCC)    Obesity    OSA on CPAP    severe obstructive sleep apnea with an AHI of 30.2/h with nocturnal hypoxemia now on CPAP at 11cm H2O   PONV (postoperative nausea and vomiting)     Past Surgical History: Past Surgical History:  Procedure Laterality Date   ABDOMINAL HYSTERECTOMY     APPENDECTOMY     APPLICATION OF CRANIAL NAVIGATION N/A 08/16/2019   Procedure: APPLICATION OF CRANIAL NAVIGATION;  Surgeon: Cheryle Debby LABOR, MD;  Location: [facility] OR;  Service: Neurosurgery;  Laterality: N/A;   bilateral breast reduction  2004   CARDIAC CATHETERIZATION  01/31/19   Stent inserted to open blockage   CHOLECYSTECTOMY     COLONOSCOPY WITH PROPOFOL  N/A 05/07/2021  Procedure: COLONOSCOPY WITH PROPOFOL ;  Surgeon: Eartha Angelia Sieving, MD;  Location: [facility];  Service: Gastroenterology;  Laterality: N/A;  7:30   CORONARY PRESSURE/FFR STUDY N/A 02/01/2020   Procedure: INTRAVASCULAR PRESSURE WIRE/FFR STUDY;  Surgeon: Verlin Lonni BIRCH, MD;  Location: [facility] INVASIVE CV LAB;  Service: Cardiovascular;  Laterality: N/A;   CORONARY STENT INTERVENTION N/A  02/01/2020   Procedure: CORONARY STENT INTERVENTION;  Surgeon: Verlin Lonni BIRCH, MD;  Location: [facility] INVASIVE CV LAB;  Service: Cardiovascular;  Laterality: N/A;   CRANIOTOMY Left 08/16/2019   Procedure: Left craniotomy for tumor resection;  Surgeon: Cheryle Debby LABOR, MD;  Location: [facility] OR;  Service: Neurosurgery;  Laterality: Left;  Left craniotomy for tumor resection   LEFT HEART CATH AND CORONARY ANGIOGRAPHY N/A 02/01/2020   Procedure: LEFT HEART CATH AND CORONARY ANGIOGRAPHY;  Surgeon: Verlin Lonni BIRCH, MD;  Location: [facility] INVASIVE CV LAB;  Service: Cardiovascular;  Laterality: N/A;   POLYPECTOMY  05/07/2021   Procedure: POLYPECTOMY;  Surgeon: Eartha Angelia, Sieving, MD;  Location: [facility];  Service: Gastroenterology;;   REDUCTION MAMMAPLASTY Bilateral    removal of left ovarian cyst  2003   SALPINGECTOMY     TUBAL LIGATION  2011   Women's, removal of tubes 2013    Family History: Family History  Problem Relation Age of Onset   Coronary artery disease Mother    Hypertension Mother    Heart attack Mother    Heart disease Mother    Coronary artery disease Father    Kidney disease Father    Hypertension Father    Multiple sclerosis Sister    Hypertension Sister    Stroke Maternal Grandmother    Heart attack Maternal Grandfather    Hypertension Sister    Anesthesia problems Neg Hx    Hypotension Neg Hx    Malignant hyperthermia Neg Hx    Pseudochol deficiency Neg Hx     Social History: Social History   Tobacco Use  Smoking Status Never  Smokeless Tobacco Never   Social History   Substance and Sexual Activity  Alcohol Use Yes   Comment: rarely   Social History   Substance and Sexual Activity  Drug Use No    Allergies: Allergies  Allergen Reactions   Lisinopril  Cough    Medications: Current Outpatient Medications  Medication Sig Dispense Refill   acetaminophen  (TYLENOL ) 325 MG tablet Take 325 mg by mouth every 6 (six) hours as needed.      amLODipine  (NORVASC ) 10 MG tablet Take 1 tablet (10 mg total) by mouth daily. 90 tablet 3   aspirin  81 MG chewable tablet Chew 1 tablet (81 mg total) by mouth daily. 90 tablet 1   atorvastatin  (LIPITOR ) 80 MG tablet TAKE 1 TABLET DAILY 90 tablet 3   cloNIDine  (CATAPRES ) 0.3 MG tablet Take 1 tablet (0.3 mg total) by mouth at bedtime. 90 tablet 3   labetalol  (NORMODYNE ) 200 MG tablet Take 1 tablet (200 mg total) by mouth 2 (two) times daily. 180 tablet 3   meloxicam  (MOBIC ) 15 MG tablet TAKE 1 TABLET BY MOUTH ONCE  DAILY FOR 5 DAYS, THEN AS NEEDED FOR PAIN 20 tablet 0   triamterene -hydrochlorothiazide  (MAXZIDE ) 75-50 MG tablet Take 1 tablet by mouth daily. 90 tablet 3   valsartan  (DIOVAN ) 80 MG tablet Take 1 tablet (80 mg total) by mouth daily. 90 tablet 1   Vitamin D , Ergocalciferol , (DRISDOL ) 1.25 MG (50000 UNIT) CAPS capsule Take 1 capsule (50,000 Units total) by mouth every 7 (seven)  days. 12 capsule 2   No current facility-administered medications for this visit.    Review of Systems: GENERAL: negative for malaise, night sweats HEENT: No changes in hearing or vision, no nose bleeds or other nasal problems. NECK: Negative for lumps, goiter, pain and significant neck swelling RESPIRATORY: Negative for cough, wheezing CARDIOVASCULAR: Negative for chest pain, leg swelling, palpitations, orthopnea GI: SEE HPI MUSCULOSKELETAL: Negative for joint pain or swelling, back pain, and muscle pain. SKIN: Negative for lesions, rash PSYCH: Negative for sleep disturbance, mood disorder and recent psychosocial stressors. HEMATOLOGY Negative for prolonged bleeding, bruising easily, and swollen nodes. ENDOCRINE: Negative for cold or heat intolerance, polyuria, polydipsia and goiter. NEURO: negative for tremor, gait imbalance, syncope and seizures. The remainder of the review of systems is noncontributory.   Physical Exam: BP 120/87 (BP Location: Left Arm, Patient Position: Sitting, Cuff Size:  Large)   Pulse 80   Temp 98.1 F (36.7 C) (Oral)   Ht 5' 3 (1.6 m)   Wt 290 lb 1.6 oz (131.6 kg)   LMP 05/16/2022 (Exact Date)   BMI 51.39 kg/m  GENERAL: The patient is AO x3, in no acute distress. Obese. HEENT: Head is normocephalic and atraumatic. EOMI are intact. Mouth is well hydrated and without lesions. NECK: Supple. No masses LUNGS: Clear to auscultation. No presence of rhonchi/wheezing/rales. Adequate chest expansion HEART: RRR, normal s1 and s2. ABDOMEN: Soft, nontender, no guarding, no peritoneal signs, and nondistended. BS +. No masses. EXTREMITIES: Without any cyanosis, clubbing, rash, lesions or edema. NEUROLOGIC: AOx3, no focal motor deficit. SKIN: no jaundice, no rashes  Imaging/Labs: as above  I personally reviewed and interpreted the available labs, imaging and endoscopic files.  Impression and Plan: [name] is a 53 y.o. female with PMH brain tumor s/p resection, MI s/p stent placement on DAPT,  who presents for evaluation of elevated bilirubin and alkaline phosphatase. Patient has had mild elevation of her LFTs, which has fluctuated in the low range. Unfortunately, she did not pursue any of the workup ordered before, besides having a RUQ US  that was unremarkable. No concern for DILI based on her most medication list.  We will reorder serologies today.  - Check  hepatitis A/B/C serologies, iron panel, ANA, AMA, ASMA, IgG, IgM, TTG IgA, alk phos isoenzymes and fractionated bilirubin  All questions were answered.      Toribio Fortune, MD Gastroenterology and Hepatology [facility] Gastroenterology

## 2024-04-20 DIAGNOSIS — R7989 Other specified abnormal findings of blood chemistry: Secondary | ICD-10-CM | POA: Diagnosis not present

## 2024-04-22 LAB — ALKALINE PHOSPHATASE ISOENZYMES
Alkaline phosphatase (APISO): 154 U/L — ABNORMAL HIGH (ref 37–153)
Bone Isoenzymes: 44 % (ref 28–66)
Intestinal Isoenzymes: 0 % — ABNORMAL LOW (ref 1–24)
Liver Isoenzymes: 56 % (ref 25–69)
Macrohepatic isoenzymes: 0 % (ref <=0)
Placental isoenzymes: 0 % (ref <=0)

## 2024-04-23 LAB — IRON,TIBC AND FERRITIN PANEL
%SAT: 25 % (ref 16–45)
Ferritin: 35 ng/mL (ref 16–232)
Iron: 75 ug/dL (ref 45–160)
TIBC: 306 ug/dL (ref 250–450)

## 2024-04-23 LAB — HEPATITIS PANEL, ACUTE
Hep A IgM: NONREACTIVE
Hep B C IgM: NONREACTIVE
Hepatitis B Surface Ag: NONREACTIVE
Hepatitis C Ab: NONREACTIVE

## 2024-04-23 LAB — ANTI-SMOOTH MUSCLE ANTIBODY, IGG: Actin (Smooth Muscle) Antibody (IGG): <20 U (ref <20)

## 2024-04-23 LAB — IGG: IgG (Immunoglobin G), Serum: 1273 mg/dL (ref 600–1640)

## 2024-04-23 LAB — ALKALINE PHOSPHATASE, BONE SPECIFIC: ALKALINE PHOSPHATASE, BONE SPECIFIC: 21.9 ug/L (ref 5.6–29.0)

## 2024-04-23 LAB — IGM: IgM, Serum: 57 mg/dL (ref 50–300)

## 2024-04-23 LAB — TISSUE TRANSGLUTAMINASE, IGA: (tTG) Ab, IgA: <1 U/mL

## 2024-04-23 LAB — ANA: Anti Nuclear Antibody (ANA): NEGATIVE

## 2024-04-23 LAB — IGA: Immunoglobulin A: 168 mg/dL (ref 47–310)

## 2024-04-23 LAB — MITOCHONDRIAL ANTIBODIES: Mitochondrial M2 Ab, IgG: <=20 U (ref <=20.0)

## 2024-04-27 ENCOUNTER — Ambulatory Visit (INDEPENDENT_AMBULATORY_CARE_PROVIDER_SITE_OTHER): Payer: Self-pay | Admitting: Gastroenterology

## 2024-04-27 DIAGNOSIS — R748 Abnormal levels of other serum enzymes: Secondary | ICD-10-CM

## 2024-04-27 NOTE — Progress Notes (Signed)
 Placed in my reminders for the patient to have drawn around 10/28/2024 at [facility] per Dr. Eartha.

## 2024-04-29 DIAGNOSIS — H35033 Hypertensive retinopathy, bilateral: Secondary | ICD-10-CM | POA: Diagnosis not present

## 2024-05-05 ENCOUNTER — Other Ambulatory Visit (HOSPITAL_BASED_OUTPATIENT_CLINIC_OR_DEPARTMENT_OTHER): Payer: Self-pay | Admitting: Cardiovascular Disease

## 2024-05-21 ENCOUNTER — Telehealth: Payer: Self-pay | Admitting: Family Medicine

## 2024-05-21 NOTE — Telephone Encounter (Signed)
 Pt never read msg re dexa, Dr Selwyn ( GI) actually ordered the dexa, ps review with ter how to proceed to schedule the test, she needs to have it done!

## 2024-05-24 NOTE — Telephone Encounter (Signed)
 Unsuccessful contact. Sending letter with detailed instructions on how to schedule test

## 2024-07-27 ENCOUNTER — Encounter (INDEPENDENT_AMBULATORY_CARE_PROVIDER_SITE_OTHER): Payer: Self-pay | Admitting: Gastroenterology

## 2024-08-12 ENCOUNTER — Ambulatory Visit: Admitting: Family Medicine

## 2024-08-22 ENCOUNTER — Other Ambulatory Visit (HOSPITAL_BASED_OUTPATIENT_CLINIC_OR_DEPARTMENT_OTHER): Payer: Self-pay | Admitting: Family

## 2024-09-15 ENCOUNTER — Other Ambulatory Visit (INDEPENDENT_AMBULATORY_CARE_PROVIDER_SITE_OTHER): Payer: Self-pay

## 2024-09-15 DIAGNOSIS — R7989 Other specified abnormal findings of blood chemistry: Secondary | ICD-10-CM

## 2024-11-16 ENCOUNTER — Ambulatory Visit: Admitting: Family Medicine

## 2024-11-16 ENCOUNTER — Encounter: Payer: Self-pay | Admitting: Family Medicine

## 2024-11-16 ENCOUNTER — Other Ambulatory Visit (HOSPITAL_COMMUNITY): Payer: Self-pay | Admitting: Family Medicine

## 2024-11-16 VITALS — BP 118/84 | HR 83 | Resp 18 | Ht 63.0 in | Wt 294.0 lb

## 2024-11-16 DIAGNOSIS — I10 Essential (primary) hypertension: Secondary | ICD-10-CM

## 2024-11-16 DIAGNOSIS — E785 Hyperlipidemia, unspecified: Secondary | ICD-10-CM | POA: Insufficient documentation

## 2024-11-16 DIAGNOSIS — I2510 Atherosclerotic heart disease of native coronary artery without angina pectoris: Secondary | ICD-10-CM

## 2024-11-16 DIAGNOSIS — I214 Non-ST elevation (NSTEMI) myocardial infarction: Secondary | ICD-10-CM

## 2024-11-16 DIAGNOSIS — Z23 Encounter for immunization: Secondary | ICD-10-CM

## 2024-11-16 DIAGNOSIS — F4321 Adjustment disorder with depressed mood: Secondary | ICD-10-CM

## 2024-11-16 DIAGNOSIS — I5189 Other ill-defined heart diseases: Secondary | ICD-10-CM

## 2024-11-16 DIAGNOSIS — Z1231 Encounter for screening mammogram for malignant neoplasm of breast: Secondary | ICD-10-CM

## 2024-11-16 DIAGNOSIS — G4733 Obstructive sleep apnea (adult) (pediatric): Secondary | ICD-10-CM

## 2024-11-16 DIAGNOSIS — R7989 Other specified abnormal findings of blood chemistry: Secondary | ICD-10-CM | POA: Insufficient documentation

## 2024-11-16 NOTE — Patient Instructions (Addendum)
 aNNUAL EXAM IN 4 MONTH  ZEPBOUND  IS PRESCRIBED  nEED APPT FOR SLEEP APNEA   Recommend  group meeting at [facility] and  / or Tammy for grief, if not adequate message for individual  Mammogram to be scheduled at checkout  Nurse visit next week for Hep B #1    Fating labs at 1 pm today  Thanks for choosing [facility], we consider it a privelige to serve you.

## 2024-11-16 NOTE — Progress Notes (Unsigned)
" ° °  FOTINI LEMUS     MRN: [id number]      DOB: 23-Feb-1971  Chief Complaint  Patient presents with   Hypertension    HPI Ms. Rosero is here for follow up and re-evaluation of chronic medical conditions, medication management and review of any available recent lab and radiology data.  Preventive health is updated, specifically  Cancer screening and Immunization.   Questions or concerns regarding consultations or procedures which the PT has had in the interim are  addressed. The PT denies any adverse reactions to current medications since the last visit.  Grieving loss of her Mom approx 3 months ago ROS Denies recent fever or chills. Denies sinus pressure, nasal congestion, ear pain or sore throat. Denies chest congestion, productive cough or wheezing. Denies chest pains, palpitations and leg swelling Denies abdominal pain, nausea, vomiting,diarrhea or constipation.   Denies dysuria, frequency, hesitancy or incontinence. Denies joint pain, swelling and limitation in mobility. Denies headaches, seizures, numbness, or tingling. Denies depression, anxiety or insomnia. Denies skin break down or rash.   PE  BP 118/84   Pulse 83   Resp 18   Ht 5' 3 (1.6 m)   Wt 294 lb (133.4 kg)   LMP 05/16/2022   SpO2 94%   BMI 52.08 kg/m   Patient alert and oriented and in no cardiopulmonary distress.  HEENT: No facial asymmetry, EOMI,     Neck supple .  Chest: Clear to auscultation bilaterally.  CVS: S1, S2 no murmurs, no S3.Regular rate.  ABD: Soft non tender.   Ext: No edema  MS: Adequate ROM spine, shoulders, hips and knees.  Skin: Intact, no ulcerations or rash noted.  Psych: Good eye contact, normal affect. Memory intact not anxious or depressed appearing.  CNS: CN 2-12 intact, power,  normal throughout.no focal deficits noted.   Assessment & Plan  No problem-specific Assessment & Plan notes found for this encounter.  "

## 2024-11-17 ENCOUNTER — Encounter: Payer: Self-pay | Admitting: Family Medicine

## 2024-11-17 DIAGNOSIS — I5189 Other ill-defined heart diseases: Secondary | ICD-10-CM | POA: Insufficient documentation

## 2024-11-17 DIAGNOSIS — F4321 Adjustment disorder with depressed mood: Secondary | ICD-10-CM | POA: Insufficient documentation

## 2024-11-17 DIAGNOSIS — Z23 Encounter for immunization: Secondary | ICD-10-CM | POA: Insufficient documentation

## 2024-11-17 MED ORDER — ZEPBOUND 2.5 MG/0.5ML ~~LOC~~ SOAJ: 2.5000 mg | SUBCUTANEOUS | 0 refills | Status: AC

## 2024-11-17 NOTE — Assessment & Plan Note (Signed)
 Needs appt for  f/u with pulmonary for treatment

## 2024-11-17 NOTE — Assessment & Plan Note (Signed)
 Currently grieving loss of Mom 3 months ago, interested in and will benefit from community based grief groups, info provided at visit , will message for individual sessions if needed.

## 2024-11-17 NOTE — Assessment & Plan Note (Signed)
 Updated lab needed.

## 2024-11-17 NOTE — Assessment & Plan Note (Signed)
" °  Patient re-educated about  the importance of commitment to a  minimum of 150 minutes of exercise per week as able.  The importance of healthy food choices with portion control discussed, as well as eating regularly and within a 12 hour window most days. The need to choose clean , green food 50 to 75% of the time is discussed, as well as to make water  the primary drink and set a goal of 64 ounces water  daily.       11/16/2024   11:33 AM 04/18/2024    3:59 PM 03/29/2024   10:26 AM  Weight /BMI  Weight 294 lb 290 lb 1.6 oz 288 lb 1.9 oz  Height 5' 3 (1.6 m) 5' 3 (1.6 m) 5' 3 (1.6 m)  BMI 52.08 kg/m2 51.39 kg/m2 51.04 kg/m2    Deteriorated . Zepbound   prescribed "

## 2024-11-17 NOTE — Assessment & Plan Note (Signed)
 After obtaining informed consent, the influenza vaccine is  administered , with no adverse effect noted at the time of administration.

## 2024-11-17 NOTE — Assessment & Plan Note (Addendum)
 No s/s of decompensation at visit, needs regular cardiology follow up

## 2024-11-17 NOTE — Assessment & Plan Note (Signed)
 Denies current / recent chest pain or excessive fatigue

## 2024-11-17 NOTE — Assessment & Plan Note (Signed)
 DASH diet and commitment to daily physical activity for a minimum of 30 minutes discussed and encouraged, as a part of hypertension management. The importance of attaining a healthy weight is also discussed.     11/16/2024   12:00 PM 11/16/2024   11:33 AM 04/18/2024    3:59 PM 03/29/2024   11:05 AM 03/29/2024   10:26 AM 02/15/2024    1:02 PM 11/23/2023    8:49 AM  BP/Weight  Systolic BP 118 130 120 128 141 124 140  Diastolic BP 84 91 87 84 93 86 82  Wt. (Lbs)  294 290.1  288.12 293.8 297  BMI  52.08 kg/m2 51.39 kg/m2  51.04 kg/m2 52.04 kg/m2 52.61 kg/m2     Controlled, no change in medication

## 2024-11-17 NOTE — Assessment & Plan Note (Signed)
 Hyperlipidemia:Low fat diet discussed and encouraged.   Lipid Panel  Lab Results  Component Value Date   CHOL 134 03/29/2024   HDL 58 03/29/2024   LDLCALC 59 03/29/2024   TRIG 88 03/29/2024   CHOLHDL 2.3 03/29/2024     Updated lab needed .

## 2024-11-21 ENCOUNTER — Ambulatory Visit (HOSPITAL_BASED_OUTPATIENT_CLINIC_OR_DEPARTMENT_OTHER)

## 2025-02-13 ENCOUNTER — Ambulatory Visit: Admitting: Physician Assistant

## 2025-03-16 ENCOUNTER — Encounter: Payer: Self-pay | Admitting: Family Medicine

## 2025-04-05 ENCOUNTER — Ambulatory Visit (HOSPITAL_COMMUNITY)
